# Patient Record
Sex: Male | Born: 1960
Health system: Southern US, Community
[De-identification: ages and names within clinical notes are randomized; demographics above are authoritative.]

## PROBLEM LIST (undated history)

## (undated) DIAGNOSIS — M109 Gout, unspecified: Secondary | ICD-10-CM

## (undated) DIAGNOSIS — F1410 Cocaine abuse, uncomplicated: Secondary | ICD-10-CM

## (undated) DIAGNOSIS — I639 Cerebral infarction, unspecified: Secondary | ICD-10-CM

## (undated) DIAGNOSIS — I2510 Atherosclerotic heart disease of native coronary artery without angina pectoris: Secondary | ICD-10-CM

## (undated) DIAGNOSIS — Z720 Tobacco use: Secondary | ICD-10-CM

## (undated) HISTORY — PX: TOE SURGERY: SHX1073

---

## 2014-11-26 ENCOUNTER — Emergency Department (HOSPITAL_COMMUNITY)
Admission: EM | Admit: 2014-11-26 | Discharge: 2014-11-26 | Disposition: A | Discharge status: Home / Self Care | Payer: Medicaid Other | Attending: Emergency Medicine | Admitting: Emergency Medicine

## 2014-11-26 ENCOUNTER — Encounter (HOSPITAL_COMMUNITY): Payer: Self-pay | Admitting: Emergency Medicine

## 2014-11-26 ENCOUNTER — Emergency Department (HOSPITAL_COMMUNITY): Payer: Medicaid Other

## 2014-11-26 DIAGNOSIS — Z720 Tobacco use: Secondary | ICD-10-CM | POA: Diagnosis not present

## 2014-11-26 DIAGNOSIS — Y998 Other external cause status: Secondary | ICD-10-CM | POA: Diagnosis not present

## 2014-11-26 DIAGNOSIS — M25561 Pain in right knee: Secondary | ICD-10-CM

## 2014-11-26 DIAGNOSIS — S8991XA Unspecified injury of right lower leg, initial encounter: Secondary | ICD-10-CM | POA: Diagnosis not present

## 2014-11-26 DIAGNOSIS — Y9389 Activity, other specified: Secondary | ICD-10-CM | POA: Diagnosis not present

## 2014-11-26 DIAGNOSIS — Y92410 Unspecified street and highway as the place of occurrence of the external cause: Secondary | ICD-10-CM | POA: Diagnosis not present

## 2014-11-26 DIAGNOSIS — M25512 Pain in left shoulder: Secondary | ICD-10-CM

## 2014-11-26 DIAGNOSIS — S4992XA Unspecified injury of left shoulder and upper arm, initial encounter: Secondary | ICD-10-CM | POA: Diagnosis present

## 2014-11-26 MED ORDER — KETOROLAC TROMETHAMINE 60 MG/2ML IM SOLN
60.0000 mg | Freq: Once | INTRAMUSCULAR | Status: AC
  Administered 2014-11-26: 60 mg via INTRAMUSCULAR
  Filled 2014-11-26: qty 2

## 2014-11-26 MED ORDER — DIAZEPAM 5 MG PO TABS
5.0000 mg | ORAL_TABLET | Freq: Once | ORAL | Status: AC
  Administered 2014-11-26: 5 mg via ORAL
  Filled 2014-11-26: qty 1

## 2014-11-26 MED ORDER — NAPROXEN 500 MG PO TABS: 500.0000 mg | ORAL_TABLET | Freq: Two times a day (BID) | ORAL | Status: DC

## 2014-11-26 MED ORDER — METHOCARBAMOL 500 MG PO TABS: 500.0000 mg | ORAL_TABLET | Freq: Two times a day (BID) | ORAL | Status: DC
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
## 2014-11-26 NOTE — ED Notes (Signed)
Pt in X ray
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
## 2015-06-24 ENCOUNTER — Emergency Department (EMERGENCY_DEPARTMENT_HOSPITAL)
Admit: 2015-06-24 | Discharge: 2015-06-24 | Disposition: A | Discharge status: Home / Self Care | Payer: Medicaid Other | Attending: Emergency Medicine | Admitting: Emergency Medicine

## 2015-06-24 ENCOUNTER — Encounter (HOSPITAL_COMMUNITY): Payer: Self-pay | Admitting: *Deleted

## 2015-06-24 ENCOUNTER — Emergency Department (HOSPITAL_COMMUNITY)
Admission: EM | Admit: 2015-06-24 | Discharge: 2015-06-24 | Disposition: A | Discharge status: Home / Self Care | Payer: Medicaid Other | Attending: Emergency Medicine | Admitting: Emergency Medicine

## 2015-06-24 ENCOUNTER — Emergency Department (HOSPITAL_COMMUNITY): Payer: Medicaid Other

## 2015-06-24 DIAGNOSIS — R2243 Localized swelling, mass and lump, lower limb, bilateral: Secondary | ICD-10-CM | POA: Diagnosis not present

## 2015-06-24 DIAGNOSIS — Z79899 Other long term (current) drug therapy: Secondary | ICD-10-CM | POA: Diagnosis not present

## 2015-06-24 DIAGNOSIS — F17210 Nicotine dependence, cigarettes, uncomplicated: Secondary | ICD-10-CM | POA: Diagnosis not present

## 2015-06-24 DIAGNOSIS — R002 Palpitations: Secondary | ICD-10-CM | POA: Diagnosis not present

## 2015-06-24 DIAGNOSIS — M79609 Pain in unspecified limb: Secondary | ICD-10-CM | POA: Diagnosis not present

## 2015-06-24 DIAGNOSIS — M7989 Other specified soft tissue disorders: Secondary | ICD-10-CM | POA: Diagnosis not present

## 2015-06-24 DIAGNOSIS — R079 Chest pain, unspecified: Secondary | ICD-10-CM | POA: Diagnosis not present

## 2015-06-24 DIAGNOSIS — R0602 Shortness of breath: Secondary | ICD-10-CM | POA: Insufficient documentation

## 2015-06-24 LAB — COMPREHENSIVE METABOLIC PANEL
ALBUMIN: 3 g/dL — AB (ref 3.5–5.0)
ALK PHOS: 62 U/L (ref 38–126)
ALT: 15 U/L — ABNORMAL LOW (ref 17–63)
ANION GAP: 8 (ref 5–15)
AST: 17 U/L (ref 15–41)
BILIRUBIN TOTAL: 0.4 mg/dL (ref 0.3–1.2)
BUN: 8 mg/dL (ref 6–20)
CALCIUM: 8.8 mg/dL — AB (ref 8.9–10.3)
CO2: 27 mmol/L (ref 22–32)
Chloride: 106 mmol/L (ref 101–111)
Creatinine, Ser: 1.12 mg/dL (ref 0.61–1.24)
GFR calc Af Amer: >60 mL/min (ref >60)
GLUCOSE: 94 mg/dL (ref 65–99)
POTASSIUM: 4.1 mmol/L (ref 3.5–5.1)
Sodium: 141 mmol/L (ref 135–145)
TOTAL PROTEIN: 6.1 g/dL — AB (ref 6.5–8.1)

## 2015-06-24 LAB — CBC WITH DIFFERENTIAL/PLATELET
BASOS ABS: 0 10*3/uL (ref 0.0–0.1)
Basophils Relative: 0 %
EOS PCT: 5 %
Eosinophils Absolute: 0.3 10*3/uL (ref 0.0–0.7)
HCT: 40.1 % (ref 39.0–52.0)
Hemoglobin: 12.9 g/dL — ABNORMAL LOW (ref 13.0–17.0)
LYMPHS PCT: 29 %
Lymphs Abs: 2 10*3/uL (ref 0.7–4.0)
MCH: 28.7 pg (ref 26.0–34.0)
MCHC: 32.2 g/dL (ref 30.0–36.0)
MCV: 89.3 fL (ref 78.0–100.0)
MONO ABS: 0.4 10*3/uL (ref 0.1–1.0)
MONOS PCT: 6 %
Neutro Abs: 4.1 10*3/uL (ref 1.7–7.7)
Neutrophils Relative %: 60 %
Platelets: 274 10*3/uL (ref 150–400)
RBC: 4.49 MIL/uL (ref 4.22–5.81)
RDW: 13.5 % (ref 11.5–15.5)
WBC: 6.9 10*3/uL (ref 4.0–10.5)

## 2015-06-24 LAB — BRAIN NATRIURETIC PEPTIDE: B Natriuretic Peptide: 44.1 pg/mL (ref 0.0–100.0)

## 2015-06-24 MED ORDER — HYDROCODONE-ACETAMINOPHEN 5-325 MG PO TABS: 1.0000 | ORAL_TABLET | Freq: Four times a day (QID) | ORAL | Status: DC | PRN

## 2015-06-24 NOTE — Discharge Instructions (Signed)
Edema °Edema is an abnormal buildup of fluids in your body tissues. Edema is somewhat dependent on gravity to pull the fluid to the lowest place in your body. That makes the condition more common in the legs and thighs (lower extremities). Painless swelling of the feet and ankles is common and becomes more likely as you get older. It is also common in looser tissues, like around your eyes.  °When the affected area is squeezed, the fluid may move out of that spot and leave a dent for a few moments. This dent is called pitting.  °CAUSES  °There are many possible causes of edema. Eating too much salt and being on your feet or sitting for a long time can cause edema in your legs and ankles. Hot weather may make edema worse. Common medical causes of edema include: °· Heart failure. °· Liver disease. °· Kidney disease. °· Weak blood vessels in your legs. °· Cancer. °· An injury. °· Pregnancy. °· Some medications. °· Obesity.  °SYMPTOMS  °Edema is usually painless. Your skin may look swollen or shiny.  °DIAGNOSIS  °Your health care provider may be able to diagnose edema by asking about your medical history and doing a physical exam. You may need to have tests such as X-rays, an electrocardiogram, or blood tests to check for medical conditions that may cause edema.  °TREATMENT  °Edema treatment depends on the cause. If you have heart, liver, or kidney disease, you need the treatment appropriate for these conditions. General treatment may include: °· Elevation of the affected body part above the level of your heart. °· Compression of the affected body part. Pressure from elastic bandages or support stockings squeezes the tissues and forces fluid back into the blood vessels. This keeps fluid from entering the tissues. °· Restriction of fluid and salt intake. °· Use of a water pill (diuretic). These medications are appropriate only for some types of edema. They pull fluid out of your body and make you urinate more often. This  gets rid of fluid and reduces swelling, but diuretics can have side effects. Only use diuretics as directed by your health care provider. °HOME CARE INSTRUCTIONS  °· Keep the affected body part above the level of your heart when you are lying down.   °· Do not sit still or stand for prolonged periods.   °· Do not put anything directly under your knees when lying down. °· Do not wear constricting clothing or garters on your upper legs.   °· Exercise your legs to work the fluid back into your blood vessels. This may help the swelling go down.   °· Wear elastic bandages or support stockings to reduce ankle swelling as directed by your health care provider.   °· Eat a low-salt diet to reduce fluid if your health care provider recommends it.   °· Only take medicines as directed by your health care provider.  °SEEK MEDICAL CARE IF:  °· Your edema is not responding to treatment. °· You have heart, liver, or kidney disease and notice symptoms of edema. °· You have edema in your legs that does not improve after elevating them.   °· You have sudden and unexplained weight gain. °SEEK IMMEDIATE MEDICAL CARE IF:  °· You develop shortness of breath or chest pain.   °· You cannot breathe when you lie down. °· You develop pain, redness, or warmth in the swollen areas.   °· You have heart, liver, or kidney disease and suddenly get edema. °· You have a fever and your symptoms suddenly get worse. °MAKE SURE YOU:  °·   Understand these instructions. °· Will watch your condition. °· Will get help right away if you are not doing well or get worse. °  °This information is not intended to replace advice given to you by your health care provider. Make sure you discuss any questions you have with your health care provider. °  °Document Released: 05/20/2005 Document Revised: 06/10/2014 Document Reviewed: 03/12/2013 °Elsevier Interactive Patient Education ©2016 [employer] ° °

## 2015-06-24 NOTE — ED Notes (Signed)
Pt sts bilateral leg pain and swelling x several months

## 2015-06-24 NOTE — ED Notes (Signed)
Patient transported to Vascular 

## 2015-06-24 NOTE — ED Provider Notes (Signed)
CSN: [id number]     Arrival date & time 06/24/15  1325 History   First MD Initiated Contact with Patient 06/24/15 1607     Chief Complaint  Patient presents with  . Leg Swelling     (Consider location/radiation/quality/duration/timing/severity/associated sxs/prior Treatment) HPI Comments: Patient presents to the emergency department with chief complaints of bilateral lower leg swelling. He states that the symptoms are intermittent. He denies any recent long travel or recent surgeries. He states that he has had some associated heart palpitations, and twinges of shortness of breath/chest pain. He is not having these symptoms now. He denies any fevers, chills, cough, nausea, vomiting, or diarrhea. He has tried taking hydrocodone with some relief. He denies any recent injuries. He is an everyday smoker. Denies any other medical problems.  The history is provided by the patient. No language interpreter was used.    History reviewed. No pertinent past medical history. History reviewed. No pertinent past surgical history. History reviewed. No pertinent family history. Social History  Substance Use Topics  . Smoking status: Current Every Day Smoker -- 1.00 packs/day    Types: Cigarettes  . Smokeless tobacco: None  . Alcohol Use: Yes     Comment: occ    Review of Systems  Constitutional: Negative for fever and chills.  Respiratory: Positive for shortness of breath.   Cardiovascular: Positive for chest pain, palpitations and leg swelling.  Gastrointestinal: Negative for nausea, vomiting, diarrhea and constipation.  Genitourinary: Negative for dysuria.  All other systems reviewed and are negative.     Allergies  Review of patient's allergies indicates no known allergies.  Home Medications   Prior to Admission medications   Medication Sig Start Date End Date Taking? Authorizing Provider  methocarbamol (ROBAXIN) 500 MG tablet Take 1 tablet (500 mg total) by mouth 2 (two) times daily.  11/26/14   Joycie Peek, PA-C  naproxen (NAPROSYN) 500 MG tablet Take 1 tablet (500 mg total) by mouth 2 (two) times daily. 11/26/14   Joycie Peek, PA-C   BP 133/97 mmHg  Pulse 63  Temp(Src) 99.2 F (37.3 C) (Oral)  Resp 18  SpO2 100% Physical Exam  Constitutional: He is oriented to person, place, and time. He appears well-developed and well-nourished.  HENT:  Head: Normocephalic and atraumatic.  Eyes: Conjunctivae and EOM are normal. Pupils are equal, round, and reactive to light. Right eye exhibits no discharge. Left eye exhibits no discharge. No scleral icterus.  Neck: Normal range of motion. Neck supple. No JVD present.  Cardiovascular: Normal rate, regular rhythm and normal heart sounds.  Exam reveals no gallop and no friction rub.   No murmur heard. Pulmonary/Chest: Effort normal and breath sounds normal. No respiratory distress. He has no wheezes. He has no rales. He exhibits no tenderness.  Abdominal: Soft. He exhibits no distension and no mass. There is no tenderness. There is no rebound and no guarding.  Musculoskeletal: Normal range of motion. He exhibits edema and tenderness.  Bilateral lower extremity swelling below the knees, no pitting edema, moderately tender to palpation about bilateral ankles and calves  Neurological: He is alert and oriented to person, place, and time.  Skin: Skin is warm and dry. No rash noted. No erythema.  No erythema, rash, or abscess, no evidence of infection  Psychiatric: He has a normal mood and affect. His behavior is normal. Judgment and thought content normal.  Nursing note and vitals reviewed.   ED Course  Procedures (including critical care time)  Results for orders placed  or performed during the hospital encounter of 06/24/15  CBC with Differential/Platelet  Result Value Ref Range   WBC 6.9 4.0 - 10.5 K/uL   RBC 4.49 4.22 - 5.81 MIL/uL   Hemoglobin 12.9 (L) 13.0 - 17.0 g/dL   HCT 16.1 09.6 - 04.5 %   MCV 89.3 78.0 - 100.0  fL   MCH 28.7 26.0 - 34.0 pg   MCHC 32.2 30.0 - 36.0 g/dL   RDW 40.9 81.1 - 91.4 %   Platelets 274 150 - 400 K/uL   Neutrophils Relative % 60 %   Neutro Abs 4.1 1.7 - 7.7 K/uL   Lymphocytes Relative 29 %   Lymphs Abs 2.0 0.7 - 4.0 K/uL   Monocytes Relative 6 %   Monocytes Absolute 0.4 0.1 - 1.0 K/uL   Eosinophils Relative 5 %   Eosinophils Absolute 0.3 0.0 - 0.7 K/uL   Basophils Relative 0 %   Basophils Absolute 0.0 0.0 - 0.1 K/uL  Brain natriuretic peptide  Result Value Ref Range   B Natriuretic Peptide 44.1 0.0 - 100.0 pg/mL  Comprehensive metabolic panel  Result Value Ref Range   Sodium 141 135 - 145 mmol/L   Potassium 4.1 3.5 - 5.1 mmol/L   Chloride 106 101 - 111 mmol/L   CO2 27 22 - 32 mmol/L   Glucose, Bld 94 65 - 99 mg/dL   BUN 8 6 - 20 mg/dL   Creatinine, Ser 7.82 0.61 - 1.24 mg/dL   Calcium 8.8 (L) 8.9 - 10.3 mg/dL   Total Protein 6.1 (L) 6.5 - 8.1 g/dL   Albumin 3.0 (L) 3.5 - 5.0 g/dL   AST 17 15 - 41 U/L   ALT 15 (L) 17 - 63 U/L   Alkaline Phosphatase 62 38 - 126 U/L   Total Bilirubin 0.4 0.3 - 1.2 mg/dL   GFR calc non Af Amer >60 >60 mL/min   GFR calc Af Amer >60 >60 mL/min   Anion gap 8 5 - 15   Dg Chest 2 View  06/24/2015  CLINICAL DATA:  Chronic bilateral foot and leg pain and swelling. Palpitations. Initial encounter. EXAM: CHEST  2 VIEW COMPARISON:  None. FINDINGS: The lungs are well-aerated. Mild peribronchial thickening is noted. There is no evidence of focal opacification, pleural effusion or pneumothorax. The heart is normal in size; the mediastinal contour is within normal limits. No acute osseous abnormalities are seen. IMPRESSION: Mild peribronchial thickening noted.  Lungs otherwise clear. Electronically Signed   By: Roanna Raider M.D.   On: 06/24/2015 18:28     I have personally reviewed and evaluated these images and lab results as part of my medical decision-making.    MDM   Final diagnoses:  Leg swelling    Patient with bilateral leg  swelling.  Reports occasional "twinges" of CP and SOB and palpitations.  No history of ACS/PE/DVT.  No recent travel.  No abdominal pain.  Symptoms are intermittent.  Will check bilateral LE dopplers, though DVT felt to be less likely, but he does have significant calf tenderness.  Given occasional SOB and palpitations will check BNP.   CBC is normal with the exception of slightly low HGB of 12.9, CMP remarkable for albumin of 3.0, will recommend primary care follow-up for this. BNP is normal. Doppler DVT studies were negative. Chest x-ray is remarkable only for mild peribronchial thickening.  Plan for primary care follow-up. Advised patient to consider wearing TED hose for compression and support. Patient understands and agrees the  plan. He is stable and ready for discharge.  Patient discussed with Dr. Denton Lank, who agrees with the plan.   Roxy Horseman, PA-C 06/24/15 1833  Cathren Laine, MD 06/24/15 [phone]

## 2015-06-24 NOTE — Progress Notes (Signed)
VASCULAR LAB PRELIMINARY  PRELIMINARY  PRELIMINARY  PRELIMINARY  Bilateral lower extremity venous duplex completed.    Preliminary report:  There is no DVT or SVT noted in the bilateral lower extremities.  There are enlarged lymph nodes noted in the bilateral groins.  Annai Heick, RVT 06/24/2015, 5:57 PM

## 2016-10-08 ENCOUNTER — Emergency Department (HOSPITAL_COMMUNITY): Payer: Medicaid Other

## 2016-10-08 ENCOUNTER — Encounter (HOSPITAL_COMMUNITY)
Admission: EM | Disposition: A | Discharge status: Home / Self Care | Payer: Self-pay | Source: Home / Self Care | Attending: Cardiovascular Disease

## 2016-10-08 ENCOUNTER — Encounter (HOSPITAL_COMMUNITY): Payer: Self-pay | Admitting: Emergency Medicine

## 2016-10-08 ENCOUNTER — Inpatient Hospital Stay (HOSPITAL_COMMUNITY)
Admission: EM | Admit: 2016-10-08 | Discharge: 2016-10-11 | DRG: 246 | Disposition: A | Discharge status: Home / Self Care | Payer: Medicaid Other | Attending: Cardiovascular Disease | Admitting: Cardiovascular Disease

## 2016-10-08 DIAGNOSIS — Z720 Tobacco use: Secondary | ICD-10-CM

## 2016-10-08 DIAGNOSIS — Z79899 Other long term (current) drug therapy: Secondary | ICD-10-CM

## 2016-10-08 DIAGNOSIS — E785 Hyperlipidemia, unspecified: Secondary | ICD-10-CM

## 2016-10-08 DIAGNOSIS — F17210 Nicotine dependence, cigarettes, uncomplicated: Secondary | ICD-10-CM | POA: Diagnosis present

## 2016-10-08 DIAGNOSIS — I2109 ST elevation (STEMI) myocardial infarction involving other coronary artery of anterior wall: Secondary | ICD-10-CM

## 2016-10-08 DIAGNOSIS — Z8249 Family history of ischemic heart disease and other diseases of the circulatory system: Secondary | ICD-10-CM

## 2016-10-08 DIAGNOSIS — I2102 ST elevation (STEMI) myocardial infarction involving left anterior descending coronary artery: Principal | ICD-10-CM

## 2016-10-08 DIAGNOSIS — Z955 Presence of coronary angioplasty implant and graft: Secondary | ICD-10-CM

## 2016-10-08 DIAGNOSIS — I259 Chronic ischemic heart disease, unspecified: Secondary | ICD-10-CM

## 2016-10-08 DIAGNOSIS — Z791 Long term (current) use of non-steroidal anti-inflammatories (NSAID): Secondary | ICD-10-CM

## 2016-10-08 DIAGNOSIS — M109 Gout, unspecified: Secondary | ICD-10-CM | POA: Diagnosis present

## 2016-10-08 DIAGNOSIS — I5021 Acute systolic (congestive) heart failure: Secondary | ICD-10-CM | POA: Diagnosis present

## 2016-10-08 DIAGNOSIS — Z79891 Long term (current) use of opiate analgesic: Secondary | ICD-10-CM

## 2016-10-08 DIAGNOSIS — Z716 Tobacco abuse counseling: Secondary | ICD-10-CM

## 2016-10-08 DIAGNOSIS — I2584 Coronary atherosclerosis due to calcified coronary lesion: Secondary | ICD-10-CM | POA: Diagnosis present

## 2016-10-08 DIAGNOSIS — I213 ST elevation (STEMI) myocardial infarction of unspecified site: Secondary | ICD-10-CM | POA: Diagnosis present

## 2016-10-08 HISTORY — PX: LEFT HEART CATH AND CORONARY ANGIOGRAPHY: CATH118249

## 2016-10-08 HISTORY — DX: Gout, unspecified: M10.9

## 2016-10-08 HISTORY — PX: CORONARY STENT INTERVENTION: CATH118234

## 2016-10-08 HISTORY — DX: Tobacco use: Z72.0

## 2016-10-08 HISTORY — DX: Atherosclerotic heart disease of native coronary artery without angina pectoris: I25.10

## 2016-10-08 LAB — PROTIME-INR
INR: 0.98
PROTHROMBIN TIME: 13 s (ref 11.4–15.2)

## 2016-10-08 LAB — APTT: aPTT: 26 seconds (ref 24–36)

## 2016-10-08 LAB — COMPREHENSIVE METABOLIC PANEL
ALBUMIN: 3.2 g/dL — AB (ref 3.5–5.0)
ALK PHOS: 70 U/L (ref 38–126)
ALT: 21 U/L (ref 17–63)
ANION GAP: 9 (ref 5–15)
AST: 30 U/L (ref 15–41)
BILIRUBIN TOTAL: 0.2 mg/dL — AB (ref 0.3–1.2)
BUN: 15 mg/dL (ref 6–20)
CALCIUM: 8.7 mg/dL — AB (ref 8.9–10.3)
CO2: 22 mmol/L (ref 22–32)
Chloride: 106 mmol/L (ref 101–111)
Creatinine, Ser: 1.16 mg/dL (ref 0.61–1.24)
GLUCOSE: 120 mg/dL — AB (ref 65–99)
POTASSIUM: 3.5 mmol/L (ref 3.5–5.1)
Sodium: 137 mmol/L (ref 135–145)
TOTAL PROTEIN: 5.8 g/dL — AB (ref 6.5–8.1)

## 2016-10-08 LAB — CBC WITH DIFFERENTIAL/PLATELET
BASOS PCT: 0 %
Basophils Absolute: 0 10*3/uL (ref 0.0–0.1)
EOS ABS: 0.2 10*3/uL (ref 0.0–0.7)
EOS PCT: 2 %
HCT: 42.6 % (ref 39.0–52.0)
HEMOGLOBIN: 13.9 g/dL (ref 13.0–17.0)
Lymphocytes Relative: 29 %
Lymphs Abs: 2.4 10*3/uL (ref 0.7–4.0)
MCH: 28.4 pg (ref 26.0–34.0)
MCHC: 32.6 g/dL (ref 30.0–36.0)
MCV: 87.1 fL (ref 78.0–100.0)
MONOS PCT: 6 %
Monocytes Absolute: 0.5 10*3/uL (ref 0.1–1.0)
NEUTROS PCT: 63 %
Neutro Abs: 5.2 10*3/uL (ref 1.7–7.7)
PLATELETS: 254 10*3/uL (ref 150–400)
RBC: 4.89 MIL/uL (ref 4.22–5.81)
RDW: 13.5 % (ref 11.5–15.5)
WBC: 8.2 10*3/uL (ref 4.0–10.5)

## 2016-10-08 LAB — TROPONIN I: TROPONIN I: 0.04 ng/mL — AB (ref <0.03)

## 2016-10-08 LAB — LIPID PANEL
CHOLESTEROL: 155 mg/dL (ref 0–200)
HDL: 47 mg/dL (ref >40)
LDL Cholesterol: 90 mg/dL (ref 0–99)
TRIGLYCERIDES: 91 mg/dL (ref <150)
Total CHOL/HDL Ratio: 3.3 RATIO
VLDL: 18 mg/dL (ref 0–40)

## 2016-10-08 SURGERY — LEFT HEART CATH AND CORONARY ANGIOGRAPHY
Anesthesia: LOCAL

## 2016-10-08 MED ORDER — TICAGRELOR 90 MG PO TABS
ORAL_TABLET | ORAL | Status: AC
  Filled 2016-10-08: qty 2

## 2016-10-08 MED ORDER — LIDOCAINE HCL (PF) 1 % IJ SOLN
INTRAMUSCULAR | Status: DC | PRN
  Administered 2016-10-08: 2 mL

## 2016-10-08 MED ORDER — MIDAZOLAM HCL 2 MG/2ML IJ SOLN
INTRAMUSCULAR | Status: AC
  Filled 2016-10-08: qty 2

## 2016-10-08 MED ORDER — FENTANYL CITRATE (PF) 100 MCG/2ML IJ SOLN
INTRAMUSCULAR | Status: AC
  Filled 2016-10-08: qty 2

## 2016-10-08 MED ORDER — VERAPAMIL HCL 2.5 MG/ML IV SOLN
INTRAVENOUS | Status: AC
Start: 2016-10-08 — End: 2016-10-08
  Filled 2016-10-08: qty 2

## 2016-10-08 MED ORDER — BIVALIRUDIN BOLUS VIA INFUSION - CUPID
INTRAVENOUS | Status: DC | PRN
  Administered 2016-10-08: 54.75 mg via INTRAVENOUS

## 2016-10-08 MED ORDER — IOPAMIDOL (ISOVUE-370) INJECTION 76%
INTRAVENOUS | Status: AC
  Filled 2016-10-08: qty 50

## 2016-10-08 MED ORDER — CANGRELOR BOLUS VIA INFUSION
INTRAVENOUS | Status: DC | PRN
  Administered 2016-10-08: 2190 ug via INTRAVENOUS

## 2016-10-08 MED ORDER — BIVALIRUDIN TRIFLUOROACETATE 250 MG IV SOLR
INTRAVENOUS | Status: AC
  Filled 2016-10-08: qty 250

## 2016-10-08 MED ORDER — ASPIRIN 81 MG PO CHEW: 324.0000 mg | CHEWABLE_TABLET | Freq: Once | ORAL | Status: DC

## 2016-10-08 MED ORDER — HEPARIN SODIUM (PORCINE) 5000 UNIT/ML IJ SOLN: 4000.0000 [IU] | Freq: Once | INTRAMUSCULAR | Status: DC

## 2016-10-08 MED ORDER — CANGRELOR TETRASODIUM 50 MG IV SOLR
INTRAVENOUS | Status: AC
  Filled 2016-10-08: qty 50

## 2016-10-08 MED ORDER — IOPAMIDOL (ISOVUE-370) INJECTION 76%
INTRAVENOUS | Status: AC
  Filled 2016-10-08: qty 125

## 2016-10-08 MED ORDER — SODIUM CHLORIDE 0.9 % IV SOLN: INTRAVENOUS | Status: DC

## 2016-10-08 MED ORDER — VERAPAMIL HCL 2.5 MG/ML IV SOLN
INTRAVENOUS | Status: DC | PRN
  Administered 2016-10-08: via INTRA_ARTERIAL

## 2016-10-08 MED ORDER — HEPARIN (PORCINE) IN NACL 2-0.9 UNIT/ML-% IJ SOLN
INTRAMUSCULAR | Status: AC
  Filled 2016-10-08: qty 1000

## 2016-10-08 MED ORDER — FENTANYL CITRATE (PF) 100 MCG/2ML IJ SOLN
INTRAMUSCULAR | Status: DC | PRN
  Administered 2016-10-08: 25 ug via INTRAVENOUS

## 2016-10-08 MED ORDER — MIDAZOLAM HCL 2 MG/2ML IJ SOLN
INTRAMUSCULAR | Status: DC | PRN
  Administered 2016-10-08: 1 mg via INTRAVENOUS

## 2016-10-08 MED ORDER — SODIUM CHLORIDE 0.9 % IV SOLN
INTRAVENOUS | Status: DC | PRN
  Administered 2016-10-08: 1.75 mg/kg/h via INTRAVENOUS

## 2016-10-08 MED ORDER — FENTANYL CITRATE (PF) 100 MCG/2ML IJ SOLN
50.0000 ug | Freq: Once | INTRAMUSCULAR | Status: AC
Start: 2016-10-08 — End: 2016-10-08
  Administered 2016-10-08: 50 ug via INTRAVENOUS

## 2016-10-08 MED ORDER — LIDOCAINE HCL (PF) 1 % IJ SOLN
INTRAMUSCULAR | Status: AC
  Filled 2016-10-08: qty 30

## 2016-10-08 MED ORDER — HEPARIN (PORCINE) IN NACL 2-0.9 UNIT/ML-% IJ SOLN
INTRAMUSCULAR | Status: DC | PRN
  Administered 2016-10-08: 1000 mL

## 2016-10-08 MED ORDER — HEPARIN SODIUM (PORCINE) 5000 UNIT/ML IJ SOLN
INTRAMUSCULAR | Status: AC
  Administered 2016-10-08: 4000 [IU]
  Filled 2016-10-08: qty 1

## 2016-10-08 SURGICAL SUPPLY — 18 items
BALLN MOZEC 2.0X12 (BALLOONS) ×2
BALLN ~~LOC~~ EUPHORA RX 3.75X12 (BALLOONS) ×2
BALLOON MOZEC 2.0X12 (BALLOONS) IMPLANT
BALLOON ~~LOC~~ EUPHORA RX 3.75X12 (BALLOONS) IMPLANT
CATH INFINITI 5FR ANG PIGTAIL (CATHETERS) ×1 IMPLANT
CATH INFINITI JR4 5F (CATHETERS) ×1 IMPLANT
CATH VISTA GUIDE 6FR XBLAD3.5 (CATHETERS) ×1 IMPLANT
DEVICE RAD COMP TR BAND LRG (VASCULAR PRODUCTS) ×1 IMPLANT
GLIDESHEATH SLEND SS 6F .021 (SHEATH) ×1 IMPLANT
GUIDEWIRE INQWIRE 1.5J.035X260 (WIRE) IMPLANT
INQWIRE 1.5J .035X260CM (WIRE) ×2
KIT ENCORE 26 ADVANTAGE (KITS) ×1 IMPLANT
KIT HEART LEFT (KITS) ×2 IMPLANT
PACK CARDIAC CATHETERIZATION (CUSTOM PROCEDURE TRAY) ×2 IMPLANT
STENT SYNERGY DES 3.5X16 (Permanent Stent) ×1 IMPLANT
SYR MEDRAD MARK V 150ML (SYRINGE) ×2 IMPLANT
TRANSDUCER W/STOPCOCK (MISCELLANEOUS) ×2 IMPLANT
WIRE COUGAR XT STRL 190CM (WIRE) ×1 IMPLANT

## 2016-10-08 NOTE — ED Notes (Signed)
4,000 units heparin given IV.

## 2016-10-08 NOTE — ED Triage Notes (Signed)
Per EMS, pt began having chest pain 45 minutes PTA, radiating to the right arm. Denies n/v, endorses shortness of breath. No known cardiac issues. 324 aspirin, 1 NTG given PTA. BP-140/90, SpO2-100% room air.

## 2016-10-08 NOTE — ED Provider Notes (Signed)
[facility]-EMERGENCY DEPT Provider Note   CSN: [id number] Arrival date & time: 10/08/16  2254     History   Chief Complaint Chief Complaint  Patient presents with  . Code STEMI    HPI Mark Herrera is a 56 y.o. male.  The history is provided by the patient.  Chest Pain   This is a new problem. The current episode started less than 1 hour ago. The problem occurs constantly. The problem has been gradually improving (after nitro). 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 The pain is present in the substernal region. The pain is severe. The quality of the pain is described as pressure-like. Radiates to: radiates to right shoulder. Duration of episode(s) is 45 minutes. Associated symptoms include diaphoresis, nausea and shortness of breath. Pertinent negatives include no abdominal pain and no cough. He has tried nitroglycerin (asa) for the symptoms. The treatment provided mild relief. Risk factors include male gender and smoking/tobacco exposure.    Past Medical History:  Diagnosis Date  . Gout   . Tobacco abuse     There are no active problems to display for this patient.   Past Surgical History:  Procedure Laterality Date  . TOE SURGERY         Home Medications    Prior to Admission medications   Medication Sig Start Date End Date Taking? Authorizing Provider  HYDROcodone-acetaminophen (NORCO/VICODIN) 5-325 MG tablet Take 1-2 tablets by mouth every 6 (six) hours as needed. 06/24/15   Roxy Horseman, PA-C  methocarbamol (ROBAXIN) 500 MG tablet Take 1 tablet (500 mg total) by mouth 2 (two) times daily. 11/26/14   Cartner, Sharlet Salina, PA-C  naproxen (NAPROSYN) 500 MG tablet Take 1 tablet (500 mg total) by mouth 2 (two) times daily. 11/26/14   Joycie Peek, PA-C    Family History Family History  Problem Relation Age of Onset  . Hypertension Father     Social History Social History  Substance Use Topics  . Smoking status: Current Every Day Smoker   Packs/day: 1.00    Types: Cigarettes  . Smokeless tobacco: Never Used  . Alcohol use Yes     Comment: occ     Allergies   Patient has no known allergies.   Review of Systems Review of Systems  Unable to perform ROS: Acuity of condition  Constitutional: Positive for diaphoresis.  Respiratory: Positive for shortness of breath. Negative for cough.   Cardiovascular: Positive for chest pain.  Gastrointestinal: Positive for nausea. Negative for abdominal pain.     Physical Exam Updated Vital Signs BP (!) 141/96 (BP Location: Left Arm)   Pulse 80   Temp 98.1 F (36.7 C) (Oral)   Resp (!) 24   SpO2 100%   Physical Exam  Constitutional: He is oriented to person, place, and time. He appears well-developed and well-nourished. He appears distressed.  HENT:  Head: Normocephalic and atraumatic.  Mouth/Throat: Oropharynx is clear and moist.  Eyes: Conjunctivae and EOM are normal.  Neck: Neck supple.  Cardiovascular: Normal rate, regular rhythm and intact distal pulses.   No murmur heard. Pulmonary/Chest: Effort normal and breath sounds normal. No respiratory distress.  Abdominal: Soft. There is no tenderness.  Musculoskeletal: He exhibits no edema or tenderness.  Neurological: He is alert and oriented to person, place, and time. He exhibits normal muscle tone. Coordination normal.  Skin: Skin is warm. He is diaphoretic.  Psychiatric: He has a normal mood and affect.  Nursing note and vitals reviewed.    ED Treatments /  Results  Labs (all labs ordered are listed, but only abnormal results are displayed) Labs Reviewed  CBC WITH DIFFERENTIAL/PLATELET  PROTIME-INR  APTT  COMPREHENSIVE METABOLIC PANEL  TROPONIN I  LIPID PANEL    EKG  EKG Interpretation None       Radiology No results found.  Procedures Procedures (including critical care time)  Medications Ordered in ED Medications  0.9 %  sodium chloride infusion (not administered)  heparin injection 4,000  Units ( Intravenous MAR Hold 10/08/16 2318)  fentaNYL (SUBLIMAZE) 100 MCG/2ML injection (not administered)  lidocaine (PF) (XYLOCAINE) 1 % injection (2 mLs  Given 10/08/16 2329)  Radial Cocktail/Verapamil only ( Intra-arterial Given 10/08/16 2330)  heparin infusion 2 units/mL in 0.9 % sodium chloride (1,000 mLs Other New Bag/Given 10/08/16 2330)  heparin 5000 UNIT/ML injection (4,000 Units  Given 10/08/16 2309)  fentaNYL (SUBLIMAZE) injection 50 mcg (50 mcg Intravenous Given 10/08/16 2308)     Initial Impression / Assessment and Plan / ED Course  I have reviewed the triage vital signs and the nursing notes.  Pertinent labs & imaging results that were available during my care of the patient were reviewed by me and considered in my medical decision making (see chart for details).     The patient is a 56 year old male with no known past medical history presents with EMS as a code STEMI. Chest pain started about 45 minutes prior to arrival but associated with right arm radiation, shortness of breath, diaphoresis. Upon arrival STEMI confirmed on EKG. He did receive nitroglycerin and aspirin prehospital with some improvement. Vital signs unremarkable. Cardiology at bedside and complained that immediately take the Cath Lab. Heparin will be given and lab were drawn  Final Clinical Impressions(s) / ED Diagnoses   Final diagnoses:  Acute ST elevation myocardial infarction (STEMI), unspecified artery (HCC)  Chest pain due to myocardial ischemia, unspecified ischemic chest pain type    New Prescriptions Current Discharge Medication List       Marijean NiemannWoodrum, Bettyjean Stefanski, MD 10/09/16 [id number]    Linwood DibblesKnapp, Jon, MD 10/09/16 [phone]

## 2016-10-08 NOTE — H&P (Signed)
     Patient ID: Mark Herrera MRN: [id number] DOB/AGE: 54/06/1960 54 y.o. Admit date: 10/08/2016  Primary Care Physician: No primary care provider on file. Primary Cardiologist: New  HPI: 56 yo male with history of tobacco abuse who had the onset of severe substernal chest pain 45 minutes ago. He called EMS. EKG with anterior ST elevation. Code STEMI activated by EMS. Pt with ongoing chest pain upon arrival to ED. He has no chronic medical problems but has smoked for 40 years. He does not see a physician regularly. He also notes dyspnea. No other complaints.   Review of systems complete and found to be negative unless listed above   Past Medical History:  Diagnosis Date  . Gout   . Tobacco abuse     Family History  Problem Relation Age of Onset  . Hypertension Father     Social History   Social History  . Marital status: Single    Spouse name: N/A  . Number of children: N/A  . Years of education: N/A   Occupational History  . Not on file.   Social History Main Topics  . Smoking status: Current Every Day Smoker    Packs/day: 1.00    Types: Cigarettes  . Smokeless tobacco: Never Used  . Alcohol use Yes     Comment: occ  . Drug use: Yes    Types: Marijuana  . Sexual activity: Not on file   Other Topics Concern  . Not on file   Social History Narrative  . No narrative on file    Past Surgical History:  Procedure Laterality Date  . TOE SURGERY      No Known Allergies  Prior to Admission Meds:  Prior to Admission medications   Medication Sig Start Date End Date Taking? Authorizing Provider  HYDROcodone-acetaminophen (NORCO/VICODIN) 5-325 MG tablet Take 1-2 tablets by mouth every 6 (six) hours as needed. 06/24/15   Roxy HorsemanBrowning, Robert, PA-C  methocarbamol (ROBAXIN) 500 MG tablet Take 1 tablet (500 mg total) by mouth 2 (two) times daily. 11/26/14   Cartner, Sharlet SalinaBenjamin, PA-C  naproxen (NAPROSYN) 500 MG tablet Take 1 tablet (500 mg total) by mouth 2 (two) times daily.  11/26/14   Joycie Peekartner, Benjamin, PA-C    Physical Exam: Blood pressure (!) 141/96, pulse 80, temperature 98.1 F (36.7 C), temperature source Oral, resp. rate (!) 24, SpO2 100 %.    General: Well developed, well nourished. He appears to be uncomfortable.  HEENT: OP clear, mucus membranes moist  SKIN: warm, dry. No rashes.  Neuro: No focal deficits  Musculoskeletal: Muscle strength 5/5 all ext  Psychiatric: Mood and affect normal  Neck: No JVD, no carotid bruits, no thyromegaly, no lymphadenopathy.  Lungs:Clear bilaterally, no wheezes, rhonci, crackles  Cardiovascular: Regular rate and rhythm. No murmurs, gallops or rubs.  Abdomen:Soft. Bowel sounds present. Non-tender.  Extremities: No lower extremity edema. Pulses are 2 + in the bilateral DP/PT.   Labs: pending  EKG: sinus, inferior ST depression. 1 mm ST elevation leads V2-V4.   ASSESSMENT AND PLAN:   1. Acute anterior STEMI: He has received 4000 units IV heparin in the ED. Plan for emergent cardiac cath with PCI. Further plans to follow after cath.   Earney Hamburghris Brayten Komar, MD 10/08/2016, 11:11 PM

## 2016-10-09 ENCOUNTER — Encounter (HOSPITAL_COMMUNITY): Payer: Self-pay | Admitting: Cardiovascular Disease

## 2016-10-09 ENCOUNTER — Other Ambulatory Visit (HOSPITAL_COMMUNITY): Payer: Medicaid Other

## 2016-10-09 DIAGNOSIS — Z8249 Family history of ischemic heart disease and other diseases of the circulatory system: Secondary | ICD-10-CM | POA: Diagnosis not present

## 2016-10-09 DIAGNOSIS — M109 Gout, unspecified: Secondary | ICD-10-CM | POA: Diagnosis present

## 2016-10-09 DIAGNOSIS — I213 ST elevation (STEMI) myocardial infarction of unspecified site: Secondary | ICD-10-CM | POA: Diagnosis present

## 2016-10-09 DIAGNOSIS — Z79899 Other long term (current) drug therapy: Secondary | ICD-10-CM | POA: Diagnosis not present

## 2016-10-09 DIAGNOSIS — I2510 Atherosclerotic heart disease of native coronary artery without angina pectoris: Secondary | ICD-10-CM

## 2016-10-09 DIAGNOSIS — Z716 Tobacco abuse counseling: Secondary | ICD-10-CM | POA: Diagnosis not present

## 2016-10-09 DIAGNOSIS — Z791 Long term (current) use of non-steroidal anti-inflammatories (NSAID): Secondary | ICD-10-CM | POA: Diagnosis not present

## 2016-10-09 DIAGNOSIS — I5021 Acute systolic (congestive) heart failure: Secondary | ICD-10-CM | POA: Diagnosis present

## 2016-10-09 DIAGNOSIS — R0789 Other chest pain: Secondary | ICD-10-CM | POA: Diagnosis not present

## 2016-10-09 DIAGNOSIS — F17210 Nicotine dependence, cigarettes, uncomplicated: Secondary | ICD-10-CM | POA: Diagnosis present

## 2016-10-09 DIAGNOSIS — Z79891 Long term (current) use of opiate analgesic: Secondary | ICD-10-CM | POA: Diagnosis not present

## 2016-10-09 DIAGNOSIS — I2584 Coronary atherosclerosis due to calcified coronary lesion: Secondary | ICD-10-CM | POA: Diagnosis present

## 2016-10-09 DIAGNOSIS — I2102 ST elevation (STEMI) myocardial infarction involving left anterior descending coronary artery: Secondary | ICD-10-CM | POA: Diagnosis present

## 2016-10-09 DIAGNOSIS — E785 Hyperlipidemia, unspecified: Secondary | ICD-10-CM | POA: Diagnosis present

## 2016-10-09 LAB — CBC
HCT: 39.6 % (ref 39.0–52.0)
Hemoglobin: 12.8 g/dL — ABNORMAL LOW (ref 13.0–17.0)
MCH: 28.3 pg (ref 26.0–34.0)
MCHC: 32.3 g/dL (ref 30.0–36.0)
MCV: 87.6 fL (ref 78.0–100.0)
Platelets: 251 10*3/uL (ref 150–400)
RBC: 4.52 MIL/uL (ref 4.22–5.81)
RDW: 13.4 % (ref 11.5–15.5)
WBC: 7.5 10*3/uL (ref 4.0–10.5)

## 2016-10-09 LAB — POCT I-STAT, CHEM 8
BUN: 16 mg/dL (ref 6–20)
CHLORIDE: 79 mmol/L — AB (ref 101–111)
CREATININE: 0.5 mg/dL — AB (ref 0.61–1.24)
Calcium, Ion: 1.12 mmol/L — ABNORMAL LOW (ref 1.15–1.40)
GLUCOSE: 85 mg/dL (ref 65–99)
HCT: 39 % (ref 39.0–52.0)
Hemoglobin: 13.3 g/dL (ref 13.0–17.0)
POTASSIUM: 3 mmol/L — AB (ref 3.5–5.1)
Sodium: 117 mmol/L — CL (ref 135–145)
TCO2: 20 mmol/L (ref 0–100)

## 2016-10-09 LAB — LIPID PANEL
Cholesterol: 138 mg/dL (ref 0–200)
HDL: 47 mg/dL (ref >40)
LDL Cholesterol: 82 mg/dL (ref 0–99)
Total CHOL/HDL Ratio: 2.9 RATIO
Triglycerides: 44 mg/dL (ref <150)
VLDL: 9 mg/dL (ref 0–40)

## 2016-10-09 LAB — TROPONIN I: Troponin I: >65 ng/mL (ref <0.03)

## 2016-10-09 LAB — BASIC METABOLIC PANEL
Anion gap: 8 (ref 5–15)
BUN: 8 mg/dL (ref 6–20)
CO2: 23 mmol/L (ref 22–32)
Calcium: 8.2 mg/dL — ABNORMAL LOW (ref 8.9–10.3)
Chloride: 106 mmol/L (ref 101–111)
Creatinine, Ser: 1.04 mg/dL (ref 0.61–1.24)
GFR calc Af Amer: >60 mL/min (ref >60)
GFR calc non Af Amer: >60 mL/min (ref >60)
Glucose, Bld: 154 mg/dL — ABNORMAL HIGH (ref 65–99)
Potassium: 3.8 mmol/L (ref 3.5–5.1)
Sodium: 137 mmol/L (ref 135–145)

## 2016-10-09 LAB — PROTIME-INR
INR: 1.09
Prothrombin Time: 14.2 seconds (ref 11.4–15.2)

## 2016-10-09 LAB — POCT ACTIVATED CLOTTING TIME: Activated Clotting Time: 554 seconds

## 2016-10-09 LAB — MRSA PCR SCREENING: MRSA BY PCR: NEGATIVE

## 2016-10-09 MED ORDER — ASPIRIN 81 MG PO CHEW
324.0000 mg | CHEWABLE_TABLET | ORAL | Status: AC
  Administered 2016-10-09: 324 mg via ORAL
  Filled 2016-10-09: qty 4

## 2016-10-09 MED ORDER — SODIUM CHLORIDE 0.9 % IV SOLN: 250.0000 mL | INTRAVENOUS | Status: DC | PRN

## 2016-10-09 MED ORDER — HYDRALAZINE HCL 20 MG/ML IJ SOLN: 5.0000 mg | INTRAMUSCULAR | Status: AC | PRN

## 2016-10-09 MED ORDER — ASPIRIN 81 MG PO CHEW
81.0000 mg | CHEWABLE_TABLET | Freq: Every day | ORAL | Status: DC
  Administered 2016-10-09: 81 mg via ORAL
  Filled 2016-10-09 (×2): qty 1

## 2016-10-09 MED ORDER — OXYCODONE HCL 5 MG PO TABS
5.0000 mg | ORAL_TABLET | ORAL | Status: DC | PRN
  Administered 2016-10-09 – 2016-10-11 (×5): 5 mg via ORAL
  Filled 2016-10-09 (×5): qty 1

## 2016-10-09 MED ORDER — ACETAMINOPHEN 325 MG PO TABS
650.0000 mg | ORAL_TABLET | ORAL | Status: DC | PRN
  Filled 2016-10-09: qty 2

## 2016-10-09 MED ORDER — IOPAMIDOL (ISOVUE-370) INJECTION 76%
INTRAVENOUS | Status: DC | PRN
  Administered 2016-10-09: 160 mL via INTRA_ARTERIAL

## 2016-10-09 MED ORDER — LABETALOL HCL 5 MG/ML IV SOLN: 10.0000 mg | INTRAVENOUS | Status: AC | PRN

## 2016-10-09 MED ORDER — ATORVASTATIN CALCIUM 80 MG PO TABS
80.0000 mg | ORAL_TABLET | Freq: Every day | ORAL | Status: DC
  Administered 2016-10-09 – 2016-10-10 (×2): 80 mg via ORAL
  Filled 2016-10-09 (×3): qty 1

## 2016-10-09 MED ORDER — NITROGLYCERIN 0.4 MG SL SUBL: 0.4000 mg | SUBLINGUAL_TABLET | SUBLINGUAL | Status: DC | PRN

## 2016-10-09 MED ORDER — CARVEDILOL 3.125 MG PO TABS
3.1250 mg | ORAL_TABLET | Freq: Two times a day (BID) | ORAL | Status: DC
  Administered 2016-10-09 – 2016-10-11 (×4): 3.125 mg via ORAL
  Filled 2016-10-09 (×4): qty 1

## 2016-10-09 MED ORDER — ACETAMINOPHEN 325 MG PO TABS
650.0000 mg | ORAL_TABLET | ORAL | Status: DC | PRN
  Administered 2016-10-10: 650 mg via ORAL
  Filled 2016-10-09: qty 2

## 2016-10-09 MED ORDER — ONDANSETRON HCL 4 MG/2ML IJ SOLN: 4.0000 mg | Freq: Four times a day (QID) | INTRAMUSCULAR | Status: DC | PRN

## 2016-10-09 MED ORDER — ATORVASTATIN CALCIUM 80 MG PO TABS
80.0000 mg | ORAL_TABLET | Freq: Every day | ORAL | Status: DC
  Administered 2016-10-09: 80 mg via ORAL

## 2016-10-09 MED ORDER — ASPIRIN EC 81 MG PO TBEC
81.0000 mg | DELAYED_RELEASE_TABLET | Freq: Every day | ORAL | Status: DC
  Administered 2016-10-10 – 2016-10-11 (×2): 81 mg via ORAL
  Filled 2016-10-09 (×2): qty 1

## 2016-10-09 MED ORDER — SODIUM CHLORIDE 0.9% FLUSH: 3.0000 mL | INTRAVENOUS | Status: DC | PRN

## 2016-10-09 MED ORDER — LISINOPRIL 2.5 MG PO TABS
2.5000 mg | ORAL_TABLET | Freq: Every day | ORAL | Status: DC
  Administered 2016-10-09 – 2016-10-11 (×3): 2.5 mg via ORAL
  Filled 2016-10-09 (×3): qty 1

## 2016-10-09 MED ORDER — TICAGRELOR 90 MG PO TABS
90.0000 mg | ORAL_TABLET | Freq: Two times a day (BID) | ORAL | Status: DC
  Administered 2016-10-09 – 2016-10-11 (×5): 90 mg via ORAL
  Filled 2016-10-09 (×5): qty 1

## 2016-10-09 MED ORDER — METOPROLOL TARTRATE 12.5 MG HALF TABLET
12.5000 mg | ORAL_TABLET | Freq: Two times a day (BID) | ORAL | Status: DC
Start: 2016-10-09 — End: 2016-10-09
  Administered 2016-10-09 (×2): 12.5 mg via ORAL
  Filled 2016-10-09 (×2): qty 1

## 2016-10-09 MED ORDER — SODIUM CHLORIDE 0.9% FLUSH
3.0000 mL | Freq: Two times a day (BID) | INTRAVENOUS | Status: DC
  Administered 2016-10-09 – 2016-10-11 (×5): 3 mL via INTRAVENOUS

## 2016-10-09 MED ORDER — TICAGRELOR 90 MG PO TABS
ORAL_TABLET | ORAL | Status: DC | PRN
  Administered 2016-10-09: 180 mg via ORAL

## 2016-10-09 MED ORDER — SODIUM CHLORIDE 0.9 % IV SOLN: 4.0000 ug/kg/min | INTRAVENOUS | Status: AC

## 2016-10-09 MED ORDER — ASPIRIN 300 MG RE SUPP: 300.0000 mg | RECTAL | Status: AC

## 2016-10-09 MED ORDER — SODIUM CHLORIDE 0.9 % IV SOLN
INTRAVENOUS | Status: DC | PRN
  Administered 2016-10-09: 4 ug/kg/min via INTRAVENOUS

## 2016-10-09 MED ORDER — SODIUM CHLORIDE 0.9 % IV SOLN: INTRAVENOUS | Status: AC

## 2016-10-09 NOTE — Progress Notes (Signed)
.  EKG CRITICAL VALUE     12 lead EKG performed.  Critical value noted. Marlane HatcherPatty Keaton, RN notified.   SCALES-PRICE,Teirra Carapia, CCT 10/09/2016 8:08 AM

## 2016-10-09 NOTE — Care Management Note (Signed)
Case Management Note Donn PieriniKristi Brinae Woods RN, BSN Unit 2W-Case Manager-- 2H coverage [phone]  Patient Details  Name: Mark Herrera MRN: [id number] Date of Birth: 08/12/1960  Subjective/Objective:  Pt admitted with STEMI, and acute HF                 Action/Plan: PTA pt lived at home, anticipate return home- pt has been started on Brilinta- pt shows that he has Medicaid- Brilinta is covered under preferred drugs with Medicaid- cost $3.70. -- 1715- spoke with pt at bedside-confirmed with pt that he has Medicaid- and explained Brilinta copay and coverage under Medicaid- pt given 30 day free card to use on Brilinta at time of discharge.   Expected Discharge Date:                  Expected Discharge Plan:  Home/Self Care  In-House Referral:     Discharge planning Services  CM Consult, Medication Assistance  Post Acute Care Choice:    Choice offered to:     DME Arranged:    DME Agency:     HH Arranged:    HH Agency:     Status of Service:  In process, will continue to follow  If discussed at Long Length of Stay Meetings, dates discussed:    Discharge Disposition:   Additional Comments:  [name], [name], RN 10/09/2016, 11:57 AM

## 2016-10-09 NOTE — Progress Notes (Signed)
Pt has Medicaid- Brilinta covered under Medicaid benefits At $3.70

## 2016-10-09 NOTE — Plan of Care (Signed)
CRITICAL VALUE ALERT  Critical value received:  Trop > 65 Date of notification:  10/09/2016  Time of notification:  1200  Critical value read back:yes  Nurse who received alert:  Dollene PrimroseHeather B RN  MD notified (1st page):  Cooper  Time of first page: 1210  MD notified (2nd page):  Time of second page:  Responding MD:  Excell Seltzerooper  Time MD responded:  1210

## 2016-10-09 NOTE — Progress Notes (Signed)
1400 Noted that pt's troponin over 65 and he had some reperfusion arrhythmia earlier. Will follow up tomorrow. Luetta Nuttingharlene Raenah Murley RN BSN 10/09/2016 2:01 PM

## 2016-10-09 NOTE — Progress Notes (Signed)
Progress Note  Patient Name: Mark Herrera Date of Encounter: 10/09/2016  Primary Cardiologist: [facility] Lenwood James)  Subjective   Mild chest pain described as 'soreness' No dyspnea  Inpatient Medications    Scheduled Meds: . aspirin  81 mg Oral Daily  . [START ON 10/10/2016] aspirin EC  81 mg Oral Daily  . atorvastatin  80 mg Oral q1800  . atorvastatin  80 mg Oral q1800  . fentaNYL      . metoprolol tartrate  12.5 mg Oral BID  . sodium chloride flush  3 mL Intravenous Q12H  . ticagrelor  90 mg Oral BID   Continuous Infusions: . sodium chloride     PRN Meds: sodium chloride, acetaminophen, acetaminophen, nitroGLYCERIN, ondansetron (ZOFRAN) IV, ondansetron (ZOFRAN) IV, sodium chloride flush   Vital Signs    Vitals:   10/09/16 0700 10/09/16 0741 10/09/16 0800 10/09/16 0900  BP: (!) 126/91  (!) 135/98 118/82  Pulse: 63  63 62  Resp:      Temp:  98.7 F (37.1 C)    TempSrc:  Oral    SpO2: 100%  100% 100%  Weight:      Height:        Intake/Output Summary (Last 24 hours) at 10/09/16 0949 Last data filed at 10/09/16 0900  Gross per 24 hour  Intake           581.25 ml  Output              900 ml  Net          -318.75 ml   Filed Weights   10/09/16 0049  Weight: 158 lb 1.1 oz (71.7 kg)    Telemetry    Sinus rhythm, PVC's, short runs NSVT - Personally Reviewed  ECG    Sinus rhythm with evolving anterolateral STEMI - Personally Reviewed  Physical Exam  NAD, pleasant male GEN: No acute distress.   Neck: No JVD Cardiac: RRR, no murmurs, rubs, or gallops.  Respiratory: Clear to auscultation bilaterally. GI: Soft, nontender, non-distended  MS: No edema; No deformity. Right radial site clear Neuro:  Nonfocal  Psych: Normal affect   Labs    Chemistry Recent Labs Lab 10/08/16 2303  NA 137  K 3.5  CL 106  CO2 22  GLUCOSE 120*  BUN 15  CREATININE 1.16  CALCIUM 8.7*  PROT 5.8*  ALBUMIN 3.2*  AST 30  ALT 21  ALKPHOS 70  BILITOT 0.2*  GFRNONAA  >60  GFRAA >60  ANIONGAP 9     Hematology Recent Labs Lab 10/08/16 2303  WBC 8.2  RBC 4.89  HGB 13.9  HCT 42.6  MCV 87.1  MCH 28.4  MCHC 32.6  RDW 13.5  PLT 254    Cardiac Enzymes Recent Labs Lab 10/08/16 2303  TROPONINI 0.04*   No results for input(s): TROPIPOC in the last 168 hours.   BNPNo results for input(s): BNP, PROBNP in the last 168 hours.   DDimer No results for input(s): DDIMER in the last 168 hours.   Radiology    Dg Chest Port 1 View  Result Date: 10/08/2016 CLINICAL DATA:  Code ST-elevation myocardial infarction. Generalized chest pain and shortness of breath. Initial encounter. EXAM: PORTABLE CHEST 1 VIEW COMPARISON:  Chest radiograph performed 06/24/2015 FINDINGS: The lungs are well-aerated. Minimal bibasilar atelectasis is noted. There is no evidence of pleural effusion or pneumothorax. The cardiomediastinal silhouette is within normal limits. No acute osseous abnormalities are seen. IMPRESSION: Minimal bibasilar atelectasis noted.  Lungs  otherwise clear. Electronically Signed   By: Roanna RaiderJeffery  Chang M.D.   On: 10/08/2016 23:36    Cardiac Studies   Cath note reviewed.   Patient Profile     56 y.o. male with acute anterior STEMI 10/09/16, Primary PCI of the proximal LAD  Assessment & Plan    1. Acute anterior STEMI: reperfusion arrhythmia noted this am. Start coreg. Keep in CCU today. Continue ASA/brilinta.  2. Acute systolic heart failure (LVEDP 19 at cath) secondary to #1, LVEF moderately reduced by ventriculography. 2D echo pending. Start coreg and lisinopril.  3. Hyperlipidemia: start atorvastatin 80 mg  4. Tobacco abuse: cessation counseling done.   Dispo: keep in CCU today, await echo, likely tx tele tomorrow.   Enzo BiSigned, Sofia Vanmeter, MD  10/09/2016, 9:49 AM

## 2016-10-09 NOTE — Plan of Care (Signed)
Problem: Pain Managment: Goal: General experience of comfort will improve Outcome: Progressing Changed PO pain medication; better pain control reported by patient

## 2016-10-09 NOTE — Progress Notes (Signed)
[id number] Pt receptive to education at this time. Daughters in room also. MI education began. Discussed MI restrictions, stent and the importance of brilinta, NTG use, risk factors and smoking cessation and heart healthy diet. Gave pt smoking cessation handout and fake cigarette. Pt plans to use nicotine patches. Will follow up tomorrow and walk if stable to do so. Pt needs brilinta card and to see case manager. Luetta NuttingCharlene Maryon Kemnitz RN BSN 10/09/2016 3:08 PM

## 2016-10-10 ENCOUNTER — Inpatient Hospital Stay (HOSPITAL_COMMUNITY): Payer: Medicaid Other

## 2016-10-10 DIAGNOSIS — I2510 Atherosclerotic heart disease of native coronary artery without angina pectoris: Secondary | ICD-10-CM

## 2016-10-10 LAB — HIV ANTIBODY (ROUTINE TESTING W REFLEX): HIV Screen 4th Generation wRfx: NONREACTIVE

## 2016-10-10 LAB — ECHOCARDIOGRAM COMPLETE
Height: 69 in
WEIGHTICAEL: 2529.12 [oz_av]

## 2016-10-10 NOTE — Progress Notes (Signed)
  Echocardiogram 2D Echocardiogram has been performed.  Nolon RodBrown, Tony 10/10/2016, 12:48 PM

## 2016-10-10 NOTE — Progress Notes (Signed)
Pt tx to [facility] per Md order, pt VSS, pt verbalized understanding of tx, Report called to receiving RN all questions answered

## 2016-10-10 NOTE — Progress Notes (Signed)
CARDIAC REHAB PHASE I   PRE:  Rate/Rhythm: 73 SR  BP:  Supine: 111/69  Sitting:   Standing:    SaO2: 100%RA  MODE:  Ambulation: 350 ft   POST:  Rate/Rhythm: 83 SR  BP:  Supine:   Sitting: 113/75  Standing:    SaO2: 99%RA 1125-1207 Pt walked 350 ft with steady gait. No CP. Had a little weakness in legs that got better with walking. To recliner with call bell. Discussed CRP 2 and will refer to [place]. Gave ex ed. Encouraged walks with staff. Tolerated well.   Luetta Nuttingharlene Gulianna Hornsby, RN BSN  10/10/2016 12:01 PM

## 2016-10-10 NOTE — Progress Notes (Signed)
Progress Note  Patient Name: Mark Herrera Date of Encounter: 10/10/2016  Primary Cardiologist: [facility] Parents Joziyah James)  Subjective   Feeling better. Still mild chest soreness. No dyspnea.   Inpatient Medications    Scheduled Meds: . aspirin  81 mg Oral Daily  . aspirin EC  81 mg Oral Daily  . atorvastatin  80 mg Oral q1800  . carvedilol  3.125 mg Oral BID WC  . lisinopril  2.5 mg Oral Daily  . sodium chloride flush  3 mL Intravenous Q12H  . ticagrelor  90 mg Oral BID   Continuous Infusions: . sodium chloride     PRN Meds: sodium chloride, acetaminophen, acetaminophen, nitroGLYCERIN, ondansetron (ZOFRAN) IV, ondansetron (ZOFRAN) IV, oxyCODONE, sodium chloride flush   Vital Signs    Vitals:   10/10/16 0700 10/10/16 0739 10/10/16 0800 10/10/16 0858  BP: 107/78  (!) 95/56 124/88  Pulse: 61  65 73  Resp:      Temp:  98.5 F (36.9 C)    TempSrc:  Oral    SpO2: 99%  99%   Weight:      Height:        Intake/Output Summary (Last 24 hours) at 10/10/16 0937 Last data filed at 10/10/16 0700  Gross per 24 hour  Intake             1140 ml  Output             2250 ml  Net            -1110 ml   Filed Weights   10/09/16 0049  Weight: 158 lb 1.1 oz (71.7 kg)    Telemetry    Sinus rhythm, PVC burden much less no more NSVT - Personally Reviewed   Physical Exam  My Exam Today: Vitals:   10/10/16 0800 10/10/16 0858  BP: (!) 95/56 124/88  Pulse: 65 73  Resp:    Temp:     Pt is alert and oriented, NAD HEENT: normal Neck: JVP - normal, carotids 2+= without bruits Lungs: CTA bilaterally CV: RRR without murmur or gallop Abd: soft, NT, Positive BS, no hepatomegaly Ext: no C/C/E, distal pulses intact and equal, right radial site clear Skin: warm/dry no rash   Labs    Chemistry  Recent Labs Lab 10/08/16 2303 10/08/16 2344 10/09/16 1019  NA 137 117* 137  K 3.5 3.0* 3.8  CL 106 79* 106  CO2 22  --  23  GLUCOSE 120* 85 154*  BUN 15 16 8   CREATININE 1.16  0.50* 1.04  CALCIUM 8.7*  --  8.2*  PROT 5.8*  --   --   ALBUMIN 3.2*  --   --   AST 30  --   --   ALT 21  --   --   ALKPHOS 70  --   --   BILITOT 0.2*  --   --   GFRNONAA >60  --  >60  GFRAA >60  --  >60  ANIONGAP 9  --  8     Hematology  Recent Labs Lab 10/08/16 2303 10/08/16 2344 10/09/16 1019  WBC 8.2  --  7.5  RBC 4.89  --  4.52  HGB 13.9 13.3 12.8*  HCT 42.6 39.0 39.6  MCV 87.1  --  87.6  MCH 28.4  --  28.3  MCHC 32.6  --  32.3  RDW 13.5  --  13.4  PLT 254  --  251    Cardiac Enzymes  Recent  Labs Lab 10/08/16 2303 10/09/16 1019 10/09/16 1908  TROPONINI 0.04* >65.00* >65.00*   No results for input(s): TROPIPOC in the last 168 hours.   BNPNo results for input(s): BNP, PROBNP in the last 168 hours.   DDimer No results for input(s): DDIMER in the last 168 hours.   Radiology    Dg Chest Port 1 View  Result Date: 10/08/2016 CLINICAL DATA:  Code ST-elevation myocardial infarction. Generalized chest pain and shortness of breath. Initial encounter. EXAM: PORTABLE CHEST 1 VIEW COMPARISON:  Chest radiograph performed 06/24/2015 FINDINGS: The lungs are well-aerated. Minimal bibasilar atelectasis is noted. There is no evidence of pleural effusion or pneumothorax. The cardiomediastinal silhouette is within normal limits. No acute osseous abnormalities are seen. IMPRESSION: Minimal bibasilar atelectasis noted.  Lungs otherwise clear. Electronically Signed   By: Roanna RaiderJeffery  Chang M.D.   On: 10/08/2016 23:36    Cardiac Studies   Cath note reviewed.   Patient Profile     56 y.o. male with acute anterior STEMI 10/09/16, Primary PCI of the proximal LAD  Assessment & Plan    1. Acute anterior STEMI: tolerating ASA, brilinta, coreg, lisinopril, statin. Await 2D echo today. Tx tele today. Home tomorrow likely.  2. Acute systolic heart failure (LVEDP 19 at cath): no clinical evidence of volume overload. Continue current Rx as above. 2D echo today.  3. Hyperlipidemia: on  atorvastatin 80 mg  4. Tobacco abuse: cessation counseling done. He seems motivated to quit  Dispo: tx tele, continue same rx, await 2D echo, likely home tomorrow  Signed, Tonny Bollmanooper, Miriam Kestler, MD  10/10/2016, 9:37 AM

## 2016-10-11 ENCOUNTER — Encounter (HOSPITAL_COMMUNITY): Payer: Self-pay | Admitting: Cardiology

## 2016-10-11 DIAGNOSIS — E785 Hyperlipidemia, unspecified: Secondary | ICD-10-CM

## 2016-10-11 DIAGNOSIS — Z720 Tobacco use: Secondary | ICD-10-CM

## 2016-10-11 MED ORDER — CARVEDILOL 3.125 MG PO TABS: 3.1250 mg | ORAL_TABLET | Freq: Two times a day (BID) | ORAL | 3 refills | Status: DC

## 2016-10-11 MED ORDER — NITROGLYCERIN 0.4 MG SL SUBL: 0.4000 mg | SUBLINGUAL_TABLET | SUBLINGUAL | 3 refills | Status: DC | PRN

## 2016-10-11 MED ORDER — TICAGRELOR 90 MG PO TABS: 90.0000 mg | ORAL_TABLET | Freq: Two times a day (BID) | ORAL | 0 refills | Status: DC

## 2016-10-11 MED ORDER — ASPIRIN 81 MG PO TBEC: 81.0000 mg | DELAYED_RELEASE_TABLET | Freq: Every day | ORAL | Status: DC

## 2016-10-11 MED ORDER — LISINOPRIL 2.5 MG PO TABS: 2.5000 mg | ORAL_TABLET | Freq: Every day | ORAL | 6 refills | Status: DC

## 2016-10-11 MED ORDER — TICAGRELOR 90 MG PO TABS: 90.0000 mg | ORAL_TABLET | Freq: Two times a day (BID) | ORAL | 10 refills | Status: DC

## 2016-10-11 MED ORDER — ATORVASTATIN CALCIUM 80 MG PO TABS: 80.0000 mg | ORAL_TABLET | Freq: Every day | ORAL | 6 refills | Status: DC

## 2016-10-11 NOTE — Progress Notes (Signed)
Confirmed patient has Brilinta card, understood use, and knew his copay.

## 2016-10-11 NOTE — Progress Notes (Signed)
Patient provided with MATCH letter.  

## 2016-10-11 NOTE — Discharge Summary (Signed)
Discharge Summary    Patient ID: Mark Herrera,  MRN: [id number], DOB/AGE: 11-14-1960 56 y.o.  Admit date: 10/08/2016 Discharge date: 10/11/2016  Primary Care Provider: System, Pcp Not In Primary Cardiologist: [facility]  Discharge Diagnoses    Active Problems:   STEMI (ST elevation myocardial infarction) (HCC)   Acute ST elevation myocardial infarction (STEMI) involving left anterior descending (LAD) coronary artery (HCC)   Hyperlipemia   Tobacco abuse   Allergies No Known Allergies  Diagnostic Studies/Procedures    LHC: 10/08/16  Conclusion     Prox RCA lesion, 30 %stenosed.  Mid RCA lesion, 30 %stenosed.  Ost LM lesion, 20 %stenosed.  Ost Cx to Prox Cx lesion, 20 %stenosed.  A STENT SYNERGY DES 3.5X16 drug eluting stent was successfully placed.  Prox LAD lesion, 100 %stenosed.  Post intervention, there is a 0% residual stenosis.  There is moderate left ventricular systolic dysfunction.  LV end diastolic pressure is mildly elevated.  The left ventricular ejection fraction is 35-45% by visual estimate.  There is no mitral valve regurgitation.   1. Acute anterior STEMI 2. Total occlusion of the proximal LAD, heavy thrombus 3. Mild non-obstructive disease in the Circumflex and RCA 4. Moderate segmental LV systolic dysfunction 5. Successful PTCA/DES x 1 proximal LAD  Recommendations: Will continue Cangrelor drip until the patient agrees to take Brilinta loading dose. He has been uncooperative during the cath and will not take Brilinta. Will continue ASA. He will need one year of DAPT. Will start low dose beta blocker as BP tolerates. Will start high dose statin. Will admit to the ICU tonight. Echo tomorrow.    TTE: 10/10/16   Study Conclusions  - Left ventricle: The cavity size was normal. Wall thickness was   normal. Systolic function was mildly reduced. The estimated   ejection fraction was in the range of 45% to 50%. There is   akinesis of the  mid-apicalanterolateral and apical myocardium.   There was no evidence of elevated ventricular filling pressure by   Doppler parameters. - Aortic valve: Trileaflet; mildly thickened, mildly calcified   leaflets. - Mitral valve: There was trivial regurgitation.  _____________   History of Present Illness     56 yo male with history of tobacco abuse who had the onset of severe substernal chest pain 45 minutes prior to admisson. He called EMS. EKG with anterior ST elevation. Code STEMI activated by EMS. Pt with ongoing chest pain upon arrival to ED. He reported no chronic medical problems but has smoked for 40 years. He does not see a physician regularly. He also noted dyspnea. No other complaints. He was taken directly to the cath lab for intervention with Dr. [facility] Course     Underwent cardiac cath noted above with DES x 1 placed pLAD. Plan for DAPT with ASA and Brilinta. Noted some reperfusion arrhythmias the following morning. LVEDP was 19 on cath, but had no signs of volume overload on exam. Was started on coreg, with high dose statin. Blood pressure tolerated well. Follow up echo showed mildly decreased LV function at 45-50%. He worked will with cardiac rehab. Was transferred to telemetry on 10/10/16. Did continue to have some mild chest soreness, but improved. He reported being committed to smoking cessation.   He was seen and assessed by Dr. Excell Seltzer on 10/11/16 and determined stable for discharge home. Follow up was arranged in the office. Medications are listed below.  General: Well developed, well nourished, AA male appearing in  no acute distress. Head: Normocephalic, atraumatic.  Neck: Supple without bruits, JVD. Lungs:  Resp regular and unlabored, CTA. Heart: RRR, S1, S2, no S3, S4, or murmur; no rub. Abdomen: Soft, non-tender, non-distended with normoactive bowel sounds. No hepatomegaly. No rebound/guarding. No obvious abdominal masses. Extremities: No clubbing,  cyanosis, edema. Distal pedal pulses are 2+ bilaterally. Radial cath site stable without bruising or hematoma Neuro: Alert and oriented X 3. Moves all extremities spontaneously. Psych: Normal affect.  _____________  Discharge Vitals Blood pressure 104/65, pulse 68, temperature 98.2 F (36.8 C), resp. rate 17, height 5\' 9"  (1.753 m), weight 155 lb 4.8 oz (70.4 kg), SpO2 100 %.  Filed Weights   10/09/16 0049 10/11/16 0551  Weight: 158 lb 1.1 oz (71.7 kg) 155 lb 4.8 oz (70.4 kg)    Labs & Radiologic Studies    CBC  Recent Labs  10/08/16 2303 10/08/16 2344 10/09/16 1019  WBC 8.2  --  7.5  NEUTROABS 5.2  --   --   HGB 13.9 13.3 12.8*  HCT 42.6 39.0 39.6  MCV 87.1  --  87.6  PLT 254  --  251   Basic Metabolic Panel  Recent Labs  10/08/16 2303 10/08/16 2344 10/09/16 1019  NA 137 117* 137  K 3.5 3.0* 3.8  CL 106 79* 106  CO2 22  --  23  GLUCOSE 120* 85 154*  BUN 15 16 8   CREATININE 1.16 0.50* 1.04  CALCIUM 8.7*  --  8.2*   Liver Function Tests  Recent Labs  10/08/16 2303  AST 30  ALT 21  ALKPHOS 70  BILITOT 0.2*  PROT 5.8*  ALBUMIN 3.2*   No results for input(s): LIPASE, AMYLASE in the last 72 hours. Cardiac Enzymes  Recent Labs  10/08/16 2303 10/09/16 1019 10/09/16 1908  TROPONINI 0.04* >65.00* >65.00*   BNP Invalid input(s): POCBNP D-Dimer No results for input(s): DDIMER in the last 72 hours. Hemoglobin A1C No results for input(s): HGBA1C in the last 72 hours. Fasting Lipid Panel  Recent Labs  10/09/16 1019  CHOL 138  HDL 47  LDLCALC 82  TRIG 44  CHOLHDL 2.9   Thyroid Function Tests No results for input(s): TSH, T4TOTAL, T3FREE, THYROIDAB in the last 72 hours.  Invalid input(s): FREET3 _____________  Dg Chest Port 1 View  Result Date: 10/08/2016 CLINICAL DATA:  Code ST-elevation myocardial infarction. Generalized chest pain and shortness of breath. Initial encounter. EXAM: PORTABLE CHEST 1 VIEW COMPARISON:  Chest radiograph  performed 06/24/2015 FINDINGS: The lungs are well-aerated. Minimal bibasilar atelectasis is noted. There is no evidence of pleural effusion or pneumothorax. The cardiomediastinal silhouette is within normal limits. No acute osseous abnormalities are seen. IMPRESSION: Minimal bibasilar atelectasis noted.  Lungs otherwise clear. Electronically Signed   By: Roanna RaiderJeffery  Chang M.D.   On: 10/08/2016 23:36   Disposition   Pt is being discharged home today in good condition.  Follow-up Plans & Appointments    Follow-up Information    Leone Brandngold, Laura R, NP Follow up on 10/21/2016.   Specialties:  Cardiology, Radiology Why:  at 8am for your follow up appt.  Contact information: [address] [place] [address] [phone]          Discharge Instructions    Amb Referral to Cardiac Rehabilitation    Complete by:  As directed    Diagnosis:   STEMI Coronary Stents     Call MD for:  redness, tenderness, or signs of infection (pain, swelling, redness, odor  or green/yellow discharge around incision site)    Complete by:  As directed    Diet - low sodium heart healthy    Complete by:  As directed    Discharge instructions    Complete by:  As directed    Radial Site Care Refer to this sheet in the next few weeks. These instructions provide you with information on caring for yourself after your procedure. Your caregiver may also give you more specific instructions. Your treatment has been planned according to current medical practices, but problems sometimes occur. Call your caregiver if you have any problems or questions after your procedure. HOME CARE INSTRUCTIONS You may shower the day after the procedure.Remove the bandage (dressing) and gently wash the site with plain soap and water.Gently pat the site dry.  Do not apply powder or lotion to the site.  Do not submerge the affected site in water for 3 to 5 days.  Inspect the site at least twice daily.  Do not flex or bend the  affected arm for 24 hours.  No lifting over 5 pounds (2.3 kg) for 5 days after your procedure.  Do not drive home if you are discharged the same day of the procedure. Have someone else drive you.  You may drive 24 hours after the procedure unless otherwise instructed by your caregiver.  What to expect: Any bruising will usually fade within 1 to 2 weeks.  Blood that collects in the tissue (hematoma) may be painful to the touch. It should usually decrease in size and tenderness within 1 to 2 weeks.  SEEK IMMEDIATE MEDICAL CARE IF: You have unusual pain at the radial site.  You have redness, warmth, swelling, or pain at the radial site.  You have drainage (other than a small amount of blood on the dressing).  You have chills.  You have a fever or persistent symptoms for more than 72 hours.  You have a fever and your symptoms suddenly get worse.  Your arm becomes pale, cool, tingly, or numb.  You have heavy bleeding from the site. Hold pressure on the site.   Increase activity slowly    Complete by:  As directed       Discharge Medications   Current Discharge Medication List    START taking these medications   Details  aspirin EC 81 MG EC tablet Take 1 tablet (81 mg total) by mouth daily.    atorvastatin (LIPITOR) 80 MG tablet Take 1 tablet (80 mg total) by mouth daily at 6 PM. Qty: 30 tablet, Refills: 6    carvedilol (COREG) 3.125 MG tablet Take 1 tablet (3.125 mg total) by mouth 2 (two) times daily with a meal. Qty: 60 tablet, Refills: 3    lisinopril (PRINIVIL,ZESTRIL) 2.5 MG tablet Take 1 tablet (2.5 mg total) by mouth daily. Qty: 30 tablet, Refills: 6    nitroGLYCERIN (NITROSTAT) 0.4 MG SL tablet Place 1 tablet (0.4 mg total) under the tongue every 5 (five) minutes x 3 doses as needed for chest pain. Qty: 25 tablet, Refills: 3    ticagrelor (BRILINTA) 90 MG TABS tablet Take 1 tablet (90 mg total) by mouth 2 (two) times daily. Qty: 60 tablet, Refills: 0      STOP taking  these medications     HYDROcodone-acetaminophen (NORCO/VICODIN) 5-325 MG tablet      methocarbamol (ROBAXIN) 500 MG tablet      naproxen (NAPROSYN) 500 MG tablet          Aspirin prescribed  at discharge?  Yes High Intensity Statin Prescribed? (Lipitor 40-80mg  or Crestor 20-40mg ): Yes Beta Blocker Prescribed? Yes For EF <40%, was ACEI/ARB Prescribed? Yes ADP Receptor Inhibitor Prescribed? (i.e. Plavix etc.-Includes Medically Managed Patients): Yes For EF <40%, Aldosterone Inhibitor Prescribed? No: EF ok Was EF assessed during THIS hospitalization? Yes Was Cardiac Rehab II ordered? (Included Medically managed Patients): Yes   Outstanding Labs/Studies   FLP/LFTs in 6 weeks if tolerating statin.   Duration of Discharge Encounter   Greater than 30 minutes including physician time.  Signed, Laverda Page NP-C 10/11/2016, 9:40 AM  Patient seen, examined. Available data reviewed. Agree with findings, assessment, and plan as outlined by Laverda Page, NP-C. My exam today: Vitals:   10/11/16 0551 10/11/16 0639  BP: (!) 95/57 104/65  Pulse: 61 68  Resp: 17   Temp: 98.2 F (36.8 C)    Pt is alert and oriented, NAD HEENT: normal Neck: JVP - normal Lungs: CTA bilaterally CV: RRR without murmur or gallop Abd: soft, NT Ext: no C/C/E, distal pulses intact and equal Skin: warm/dry no rash  The patient is doing well this morning. He is clinically stable. Blood pressure is in the low-normal range. I think he is ready for discharge and we will send him out on low doses of carvedilol and lisinopril as outlined. He should continue dual antiplatelet therapy with aspirin and brilinta for at least one year. He will be discharged on a high intensity statin drug. Early hospital follow-up will be arranged. His echo is reviewed and he has only mild LV systolic dysfunction with a segmental wall motion pattern consistent with his anterior MI.  Tonny Bollman, M.D. 10/11/2016 10:03 AM

## 2016-10-11 NOTE — Progress Notes (Signed)
Discharge order received. Next doses for meds updated on AVS and AVS printed. RN educated patient on his discharge instructions. RN provided patient with printed prescription and instructed him that the rest of his prescriptions were sent to his [facility] pharmacy, as directed on his AVS. Patient verbalized understanding of discharge paperwork and was able to teach back information to RN. Patient's nephew at bedside and will be the one taking patient home.

## 2016-10-11 NOTE — Progress Notes (Signed)
CARDIAC REHAB PHASE I   PRE:  Rate/Rhythm: 75 SR  BP:  Supine:   Sitting: 102/70  Standing:    SaO2:   MODE:  Ambulation: 550 ft   POST:  Rate/Rhythm: 75 SR  BP:  Supine:   Sitting: 100/75  Standing:    SaO2: 99%RA 0959-1020 Pt walked 550 ft with steady gait independently. No CP. Anxious to go home. Looks great.   Luetta Nuttingharlene Mykel Mohl, RN BSN  10/11/2016 10:18 AM

## 2016-10-20 NOTE — Progress Notes (Deleted)
Cardiology Office Note   Date:  10/20/2016   ID:  Mark FannyClifton J Peedin, DOB 06/09/1960, MRN [id number]  PCP:  System, Pcp Not In  Cardiologist:  Dr. Kamaal JamesMcAlhany    No chief complaint on file.     History of Present Illness: Mark Herrera is a 56 y.o. male who presents for post hospitalization from 10/08/16 to 10/11/16 for STEMI of LAD.  Also with ischemic cardiomyopathy with EF 35-45%.   He has residual non obstructive RCA and LCX disease. He had DES to LAD.  Plan for 1 year DAPT.     With echo 10/10/16 EF to 45-50%.    Pt with no prior CAD or medical problems.  + tobacco use for 40 years.  Presented with severe substernal chest pain. D/c'd on BB, ASA, brilinta, ace  and high dose statin.   Troponin pk > 65.   Glucose was up to 154.    Today** Exercise diet   tobacco    Past Medical History:  Diagnosis Date  . CAD (coronary artery disease)    10/08/16 STEMI DES x1 to pLAD EF 45%  . Gout   . Tobacco abuse     Past Surgical History:  Procedure Laterality Date  . CORONARY STENT INTERVENTION N/A 10/08/2016   Procedure: Coronary Stent Intervention;  Surgeon: Kathleene HazelMcAlhany, Christopher D, MD;  Location: [facility];  Service: Cardiovascular;  Laterality: N/A;  . LEFT HEART CATH AND CORONARY ANGIOGRAPHY N/A 10/08/2016   Procedure: Left Heart Cath and Coronary Angiography;  Surgeon: Kathleene HazelMcAlhany, Christopher D, MD;  [facility];  Service: Cardiovascular;  Laterality: N/A;  . TOE SURGERY       Current Outpatient Prescriptions  Medication Sig Dispense Refill  . aspirin EC 81 MG EC tablet Take 1 tablet (81 mg total) by mouth daily.    Marland Kitchen. atorvastatin (LIPITOR) 80 MG tablet Take 1 tablet (80 mg total) by mouth daily at 6 PM. 30 tablet 6  . carvedilol (COREG) 3.125 MG tablet Take 1 tablet (3.125 mg total) by mouth 2 (two) times daily with a meal. 60 tablet 3  . lisinopril (PRINIVIL,ZESTRIL) 2.5 MG tablet Take 1 tablet (2.5 mg total) by mouth daily. 30 tablet 6  . nitroGLYCERIN  (NITROSTAT) 0.4 MG SL tablet Place 1 tablet (0.4 mg total) under the tongue every 5 (five) minutes x 3 doses as needed for chest pain. 25 tablet 3  . ticagrelor (BRILINTA) 90 MG TABS tablet Take 1 tablet (90 mg total) by mouth 2 (two) times daily. 60 tablet 0   No current facility-administered medications for this visit.     Allergies:   Patient has no known allergies.    Social History:  The patient  reports that he has been smoking Cigarettes.  He has been smoking about 1.00 pack per day. He has never used smokeless tobacco. He reports that he drinks alcohol. He reports that he uses drugs, including Marijuana.   Family History:  The patient's ***family history includes Hypertension in his father.    ROS:  General:no colds or fevers, no weight changes Skin:no rashes or ulcers HEENT:no blurred vision, no congestion CV:see HPI PUL:see HPI GI:no diarrhea constipation or melena, no indigestion GU:no hematuria, no dysuria MS:no joint pain, no claudication Neuro:no syncope, no lightheadedness Endo:no diabetes, no thyroid disease Wt Readings from Last 3 Encounters:  10/11/16 155 lb 4.8 oz (70.4 kg)  11/26/14 160 lb (72.6 kg)     PHYSICAL EXAM: VS:  There were  no vitals taken for this visit. , BMI There is no height or weight on file to calculate BMI. General:Pleasant affect, NAD Skin:Warm and dry, brisk capillary refill HEENT:normocephalic, sclera clear, mucus membranes moist Neck:supple, no JVD, no bruits  Heart:S1S2 RRR without murmur, gallup, rub or click Lungs:clear without rales, rhonchi, or wheezes ZOX:WRUE, non tender, + BS, do not palpate liver spleen or masses Ext:no lower ext edema, 2+ pedal pulses, 2+ radial pulses Neuro:alert and oriented, MAE, follows commands, + facial symmetry    EKG:  EKG is ordered today. The ekg ordered today demonstrates ***   Recent Labs: 10/08/2016: ALT 21 10/09/2016: BUN 8; Creatinine, Ser 1.04; Hemoglobin 12.8; Platelets 251; Potassium  3.8; Sodium 137    Lipid Panel    Component Value Date/Time   CHOL 138 10/09/2016 1019   TRIG 44 10/09/2016 1019   HDL 47 10/09/2016 1019   CHOLHDL 2.9 10/09/2016 1019   VLDL 9 10/09/2016 1019   LDLCALC 82 10/09/2016 1019       Other studies Reviewed: Additional studies/ records that were reviewed today include: ***.   ASSESSMENT AND PLAN:  1.  S/p STEMI  2. CAD  3. HLD   4. Tobacco    Current medicines are reviewed with the patient today.  The patient Has no concerns regarding medicines.  The following changes have been made:  See above Labs/ tests ordered today include:see above  Disposition:   FU:  see above  Signed, Nada Boozer, NP  10/20/2016 9:53 PM    [facility] [address] [place], [place], [place]  [address]/ [address] [place], [place] Phone: [phone]; Fax: [phone]  [phone]

## 2016-10-21 ENCOUNTER — Ambulatory Visit: Payer: Self-pay | Admitting: Cardiology

## 2016-10-23 ENCOUNTER — Encounter: Payer: Self-pay | Admitting: Cardiology

## 2017-12-22 ENCOUNTER — Encounter (HOSPITAL_COMMUNITY): Payer: Self-pay | Admitting: Emergency Medicine

## 2017-12-22 ENCOUNTER — Ambulatory Visit (HOSPITAL_COMMUNITY)
Admission: EM | Admit: 2017-12-22 | Discharge: 2017-12-22 | Disposition: A | Discharge status: Home / Self Care | Payer: Self-pay | Attending: Family Medicine | Admitting: Family Medicine

## 2017-12-22 DIAGNOSIS — S39012A Strain of muscle, fascia and tendon of lower back, initial encounter: Secondary | ICD-10-CM

## 2017-12-22 MED ORDER — CYCLOBENZAPRINE HCL 10 MG PO TABS: 10.0000 mg | ORAL_TABLET | Freq: Three times a day (TID) | ORAL | 0 refills | Status: DC

## 2017-12-22 MED ORDER — DICLOFENAC SODIUM 75 MG PO TBEC: 75.0000 mg | DELAYED_RELEASE_TABLET | Freq: Two times a day (BID) | ORAL | 0 refills | Status: DC

## 2017-12-22 NOTE — ED Triage Notes (Signed)
PT reports left lower back pain after lifting something yesterday.

## 2017-12-22 NOTE — Discharge Instructions (Addendum)

## 2017-12-22 NOTE — ED Provider Notes (Signed)
[facility]   [id number] 12/22/17 Arrival Time: 0949  ASSESSMENT & PLAN:  1. Strain of lumbar region, initial encounter     Meds ordered this encounter  Medications  . cyclobenzaprine (FLEXERIL) 10 MG tablet    Sig: Take 1 tablet (10 mg total) by mouth 3 (three) times daily.    Dispense:  20 tablet    Refill:  0  . diclofenac (VOLTAREN) 75 MG EC tablet    Sig: Take 1 tablet (75 mg total) by mouth 2 (two) times daily.    Dispense:  14 tablet    Refill:  0   Medication sedation precautions. Encouraged ROM as tolerated. Will f/u here if not improving over the next week, sooner if needed.  Reviewed expectations re: course of current medical issues. Questions answered. Outlined signs and symptoms indicating need for more acute intervention. Patient verbalized understanding. After Visit Summary given.   SUBJECTIVE: History from: patient.  Mark Herrera is a 57 y.o. male who presents with complaint of intermittent left sided lower back discomfort. Onset abrupt beginning yesterday. Injury/trama: noticed pain after heavy lifting. History of back problems: occasional LBP. Previous back surgery: no. Discomfort described as aching and stiffness without radiation. Certain movements exacerbate the described discomfort. Better with rest. Extremity sensation changes or weakness: none. Ambulatory without difficulty. Normal bowel/bladder habits. No associated abdominal pain/n/v. Self treatment: has not tried OTCs for relief of pain.  Patient reports no fevers, IV drug use, recent back surgeries or procedures, urinary incontinence, or bowel incontinence.  ROS: As per HPI.   OBJECTIVE:  Vitals:   12/22/17 1002 12/22/17 1006  BP: 98/63   Pulse: 67   Resp: 18   Temp: 98.3 F (36.8 C)   TempSrc: Oral   SpO2: 97%   Weight:  159 lb (72.1 kg)    General appearance: alert; no distress Neck: supple with FROM; without midline tenderness Lungs: unlabored respirations;  symmetrical air entry Abdomen: soft, non-tender; bowel sounds normal; no masses or organomegaly; no guarding or rebound tenderness Back: left sided lower tenderness present over lumbar musculature; FROM at hips with mild discomfort reported; bruising: none; without midline tenderness Extremities: no cyanosis or edema; symmetrical with no gross deformities Skin: warm and dry Neurologic: normal gait; normal symmetric reflexes; normal LE strength and sensation Psychological: alert and cooperative; normal mood and affect   No Known Allergies  Past Medical History:  Diagnosis Date  . CAD (coronary artery disease)    10/08/16 STEMI DES x1 to pLAD EF 45%  . Gout   . Tobacco abuse    Social History   Socioeconomic History  . Marital status: Single    Spouse name: Not on file  . Number of children: Not on file  . Years of education: Not on file  . Highest education level: Not on file  Occupational History  . Not on file  Social Needs  . Financial resource strain: Not on file  . Food insecurity:    Worry: Not on file    Inability: Not on file  . Transportation needs:    Medical: Not on file    Non-medical: Not on file  Tobacco Use  . Smoking status: Current Every Day Smoker    Packs/day: 1.00    Types: Cigarettes  . Smokeless tobacco: Never Used  Substance and Sexual Activity  . Alcohol use: Yes    Comment: occ  . Drug use: Yes    Types: Marijuana  . Sexual activity: Not on file  Lifestyle  . Physical activity:    Days per week: Not on file    Minutes per session: Not on file  . Stress: Not on file  Relationships  . Social connections:    Talks on phone: Not on file    Gets together: Not on file    Attends religious service: Not on file    Active member of club or organization: Not on file    Attends meetings of clubs or organizations: Not on file    Relationship status: Not on file  . Intimate partner violence:    Fear of current or ex partner: Not on file     Emotionally abused: Not on file    Physically abused: Not on file    Forced sexual activity: Not on file  Other Topics Concern  . Not on file  Social History Narrative  . Not on file   Family History  Problem Relation Age of Onset  . Hypertension Father    Past Surgical History:  Procedure Laterality Date  . CORONARY STENT INTERVENTION N/A 10/08/2016   Procedure: Coronary Stent Intervention;  Surgeon: Kathleene Hazel, MD;  Location: [facility];  Service: Cardiovascular;  Laterality: N/A;  . LEFT HEART CATH AND CORONARY ANGIOGRAPHY N/A 10/08/2016   Procedure: Left Heart Cath and Coronary Angiography;  Surgeon: Kathleene Hazel, MD;  Location: [facility] INVASIVE CV LAB;  Service: Cardiovascular;  Laterality: N/A;  . TOE SURGERY       Mardella Layman, MD 12/22/17 1034

## 2018-11-02 ENCOUNTER — Encounter (HOSPITAL_COMMUNITY): Payer: Self-pay

## 2018-11-02 ENCOUNTER — Emergency Department (HOSPITAL_COMMUNITY): Payer: Self-pay

## 2018-11-02 ENCOUNTER — Emergency Department (HOSPITAL_COMMUNITY)
Admission: EM | Admit: 2018-11-02 | Discharge: 2018-11-02 | Disposition: A | Discharge status: Home / Self Care | Payer: Self-pay | Attending: Emergency Medicine | Admitting: Emergency Medicine

## 2018-11-02 ENCOUNTER — Other Ambulatory Visit: Payer: Self-pay

## 2018-11-02 DIAGNOSIS — I252 Old myocardial infarction: Secondary | ICD-10-CM | POA: Insufficient documentation

## 2018-11-02 DIAGNOSIS — Y929 Unspecified place or not applicable: Secondary | ICD-10-CM | POA: Insufficient documentation

## 2018-11-02 DIAGNOSIS — Z79899 Other long term (current) drug therapy: Secondary | ICD-10-CM | POA: Insufficient documentation

## 2018-11-02 DIAGNOSIS — W228XXA Striking against or struck by other objects, initial encounter: Secondary | ICD-10-CM | POA: Insufficient documentation

## 2018-11-02 DIAGNOSIS — Z7982 Long term (current) use of aspirin: Secondary | ICD-10-CM | POA: Insufficient documentation

## 2018-11-02 DIAGNOSIS — Y9301 Activity, walking, marching and hiking: Secondary | ICD-10-CM | POA: Insufficient documentation

## 2018-11-02 DIAGNOSIS — Y999 Unspecified external cause status: Secondary | ICD-10-CM | POA: Insufficient documentation

## 2018-11-02 DIAGNOSIS — I2510 Atherosclerotic heart disease of native coronary artery without angina pectoris: Secondary | ICD-10-CM | POA: Insufficient documentation

## 2018-11-02 DIAGNOSIS — S42294A Other nondisplaced fracture of upper end of right humerus, initial encounter for closed fracture: Secondary | ICD-10-CM | POA: Insufficient documentation

## 2018-11-02 DIAGNOSIS — F17210 Nicotine dependence, cigarettes, uncomplicated: Secondary | ICD-10-CM | POA: Insufficient documentation

## 2018-11-02 MED ORDER — OXYCODONE-ACETAMINOPHEN 5-325 MG PO TABS
1.0000 | ORAL_TABLET | Freq: Once | ORAL | Status: AC
  Administered 2018-11-02: 1 via ORAL
  Filled 2018-11-02: qty 1

## 2018-11-02 MED ORDER — HYDROCODONE-ACETAMINOPHEN 5-325 MG PO TABS: 1.0000 | ORAL_TABLET | Freq: Four times a day (QID) | ORAL | 0 refills | Status: DC | PRN

## 2018-11-02 NOTE — ED Triage Notes (Signed)
Pt arrives POV for eval of R sided shoulder pain s/p falling into a car while walking with granson. Pt denies N/T. ? Dislocation in triage. Pt splinting w/ alternative arm

## 2018-11-02 NOTE — ED Notes (Signed)
ED Provider at bedside. 

## 2018-11-02 NOTE — Discharge Instructions (Signed)
Please read instructions below. Apply ice to your shoulder for 20 minutes at a time.  Do not put any weight in your right arm until cleared by an orthopedic specialist. You can take ibuprofen every 6 hours as needed for pain.  You can take hydrocodone every 6 hours as needed for severe pain.  Be aware there is Tylenol in this medication.  Do not drive or drink alcohol with this medication, as it can make you drowsy. Schedule an appointment with the orthopedic specialist in 1 to 2 weeks for repeat x-ray and follow-up on your injury. Return to the ER for new or concerning symptoms.

## 2018-11-02 NOTE — ED Notes (Signed)
Patient verbalizes understanding of discharge instructions . Opportunity for questions and answers were provided . Armband removed by staff ,Pt discharged from ED. W/C  offered at D/C  and Declined W/C at D/C and was escorted to lobby by RN.  

## 2018-11-02 NOTE — ED Provider Notes (Signed)
[facility] EMERGENCY DEPARTMENT Provider Note   CSN: [id number] Arrival date & time: 11/02/18  1602    History   Chief Complaint Chief Complaint  Patient presents with   Shoulder Pain    HPI CASHTYN Herrera is a 58 y.o. male presenting to the ED with sudden onset of right shoulder pain after injury that occurred yesterday. Pt states he fell into a car, hitting the lateral aspect of his right shoulder on the car. He has had pain to the right shoulder since that time that is, worse with movement.  He also has some soreness around his right shoulder blade.  He is taking ibuprofen for symptoms with mild relief.  No other injuries reported.  No numbness or tingling to right hand.  No prior injuries to the right shoulder.     The history is provided by the patient.    Past Medical History:  Diagnosis Date   CAD (coronary artery disease)    10/08/16 STEMI DES x1 to pLAD EF 45%   Gout    Tobacco abuse     Patient Active Problem List   Diagnosis Date Noted   Hyperlipemia 10/11/2016   Tobacco abuse 10/11/2016   STEMI (ST elevation myocardial infarction) (HCC) 10/09/2016   Acute ST elevation myocardial infarction (STEMI) involving left anterior descending (LAD) coronary artery [facility])     Past Surgical History:  Procedure Laterality Date   CORONARY STENT INTERVENTION N/A 10/08/2016   Procedure: Coronary Stent Intervention;  Surgeon: Kathleene Hazel, MD;  Location: [facility] INVASIVE CV LAB;  Service: Cardiovascular;  Laterality: N/A;   LEFT HEART CATH AND CORONARY ANGIOGRAPHY N/A 10/08/2016   Procedure: Left Heart Cath and Coronary Angiography;  Surgeon: Kathleene Hazel, MD;  Location: [facility] INVASIVE CV LAB;  Service: Cardiovascular;  Laterality: N/A;   TOE SURGERY          Home Medications    Prior to Admission medications   Medication Sig Start Date End Date Taking? Authorizing Provider  aspirin EC 81 MG EC tablet Take 1 tablet (81 mg total)  by mouth daily. 10/12/16   Arty Baumgartner, NP  atorvastatin (LIPITOR) 80 MG tablet Take 1 tablet (80 mg total) by mouth daily at 6 PM. 10/11/16   Arty Baumgartner, NP  carvedilol (COREG) 3.125 MG tablet Take 1 tablet (3.125 mg total) by mouth 2 (two) times daily with a meal. 10/11/16   Arty Baumgartner, NP  cyclobenzaprine (FLEXERIL) 10 MG tablet Take 1 tablet (10 mg total) by mouth 3 (three) times daily. 12/22/17   Mardella Layman, MD  diclofenac (VOLTAREN) 75 MG EC tablet Take 1 tablet (75 mg total) by mouth 2 (two) times daily. 12/22/17   Mardella Layman, MD  HYDROcodone-acetaminophen (NORCO/VICODIN) 5-325 MG tablet Take 1-2 tablets by mouth every 6 (six) hours as needed. 11/02/18   Shakesha Soltau, [place] N, PA-C  lisinopril (PRINIVIL,ZESTRIL) 2.5 MG tablet Take 1 tablet (2.5 mg total) by mouth daily. 10/12/16   Arty Baumgartner, NP  nitroGLYCERIN (NITROSTAT) 0.4 MG SL tablet Place 1 tablet (0.4 mg total) under the tongue every 5 (five) minutes x 3 doses as needed for chest pain. 10/11/16   Arty Baumgartner, NP  ticagrelor (BRILINTA) 90 MG TABS tablet Take 1 tablet (90 mg total) by mouth 2 (two) times daily. 10/11/16   Arty Baumgartner, NP    Family History Family History  Problem Relation Age of Onset   Hypertension Father     Social  History Social History   Tobacco Use   Smoking status: Current Every Day Smoker    Packs/day: 1.00    Types: Cigarettes   Smokeless tobacco: Never Used  Substance Use Topics   Alcohol use: Yes    Comment: occ   Drug use: Yes    Types: Marijuana     Allergies   Patient has no known allergies.   Review of Systems Review of Systems  Musculoskeletal: Positive for arthralgias.  Neurological: Negative for weakness and numbness.     Physical Exam Updated Vital Signs BP 118/82 (BP Location: Right Arm)    Pulse 63    Temp 98.4 F (36.9 C) (Oral)    Resp 14    Ht  (1.753 m)    Wt 75.3 kg    SpO2 99%    BMI 24.51 kg/m   Physical  Exam Vitals signs and nursing note reviewed.  Constitutional:      General: He is not in acute distress.    Appearance: He is well-developed.  HENT:     Head: Normocephalic and atraumatic.  Eyes:     Conjunctiva/sclera: Conjunctivae normal.  Cardiovascular:     Rate and Rhythm: Normal rate and regular rhythm.  Pulmonary:     Effort: Pulmonary effort is normal. No respiratory distress.     Breath sounds: Normal breath sounds.  Chest:     Chest wall: No tenderness.  Musculoskeletal:     Comments: Right shoulder no deformity or swelling.  There is tenderness to the lateral posterior aspect of the shoulder and in the scapular region.  Pain with range of motion.  Normal sensation to the deltoid.  Normal range of motion of the elbow and wrist.  Strong equal grip strength bilaterally.  Strong radial pulses and normal sensation.  No midline spinal tenderness.  Clavicle without deformity or tenderness.  Neurological:     Mental Status: He is alert.  Psychiatric:        Mood and Affect: Mood normal.        Behavior: Behavior normal.      ED Treatments / Results  Labs (all labs ordered are listed, but only abnormal results are displayed) Labs Reviewed - No data to display  EKG None  Radiology Dg Shoulder Right  Result Date: 11/02/2018 CLINICAL DATA:  Status post fall from a curb.  Shoulder pain. EXAM: RIGHT SHOULDER - 2+ VIEW COMPARISON:  None. FINDINGS: There is cortical irregularity along the posterior humeral head concerning for a nondisplaced fracture. There is no evidence of arthropathy or other focal bone abnormality. Soft tissues are unremarkable. IMPRESSION: Cortical irregularity along the posterior humeral head concerning for a nondisplaced fracture. Electronically Signed   By: Elige Ko   On: 11/02/2018 16:40    Procedures Procedures (including critical care time)  Medications Ordered in ED Medications  oxyCODONE-acetaminophen (PERCOCET/ROXICET) 5-325 MG per tablet 1  tablet (has no administration in time range)     Initial Impression / Assessment and Plan / ED Course  I have reviewed the triage vital signs and the nursing notes.  Pertinent labs & imaging results that were available during my care of the patient were reviewed by me and considered in my medical decision making (see chart for details).        Patient with mechanical fall into a car with right shoulder pain.  X-ray with possible nondisplaced fracture of the posterior humeral head with cortical irregularity.  Neurovascularly intact.  No other injuries.  Will  place in sling for immobilization, pain medication prescribed.  Recommend ice, nonweightbearing right arm, Ortho follow-up in 1 to 2 weeks for repeat x-ray.  Patient agreeable to plan and safe for discharge.  Discussed results, findings, treatment and follow up. Patient advised of return precautions. Patient verbalized understanding and agreed with plan.  [place] Controlled Substance reporting System queried   Final Clinical Impressions(s) / ED Diagnoses   Final diagnoses:  Other closed nondisplaced fracture of proximal end of right humerus, initial encounter    ED Discharge Orders         Ordered    HYDROcodone-acetaminophen (NORCO/VICODIN) 5-325 MG tablet  Every 6 hours PRN     11/02/18 1900           Nahomi Hegner, [place] N, PA-C 11/02/18 1936    Derwood KaplanNanavati, Ankit, MD 11/03/18 1743

## 2019-05-31 ENCOUNTER — Emergency Department (HOSPITAL_COMMUNITY): Payer: Medicaid Other

## 2019-05-31 ENCOUNTER — Inpatient Hospital Stay (HOSPITAL_COMMUNITY)
Admission: EM | Admit: 2019-05-31 | Discharge: 2019-06-01 | DRG: 063 | Disposition: A | Discharge status: Home / Self Care | Payer: Medicaid Other | Attending: Neurology | Admitting: Neurology

## 2019-05-31 ENCOUNTER — Other Ambulatory Visit: Payer: Self-pay

## 2019-05-31 ENCOUNTER — Encounter (HOSPITAL_COMMUNITY): Payer: Self-pay | Admitting: Emergency Medicine

## 2019-05-31 DIAGNOSIS — I252 Old myocardial infarction: Secondary | ICD-10-CM

## 2019-05-31 DIAGNOSIS — I10 Essential (primary) hypertension: Secondary | ICD-10-CM | POA: Diagnosis present

## 2019-05-31 DIAGNOSIS — E785 Hyperlipidemia, unspecified: Secondary | ICD-10-CM | POA: Diagnosis present

## 2019-05-31 DIAGNOSIS — F14988 Cocaine use, unspecified with other cocaine-induced disorder: Secondary | ICD-10-CM | POA: Diagnosis present

## 2019-05-31 DIAGNOSIS — Z20828 Contact with and (suspected) exposure to other viral communicable diseases: Secondary | ICD-10-CM | POA: Diagnosis present

## 2019-05-31 DIAGNOSIS — I639 Cerebral infarction, unspecified: Secondary | ICD-10-CM | POA: Diagnosis present

## 2019-05-31 DIAGNOSIS — Z79899 Other long term (current) drug therapy: Secondary | ICD-10-CM

## 2019-05-31 DIAGNOSIS — Z9114 Patient's other noncompliance with medication regimen: Secondary | ICD-10-CM

## 2019-05-31 DIAGNOSIS — Z8249 Family history of ischemic heart disease and other diseases of the circulatory system: Secondary | ICD-10-CM | POA: Diagnosis not present

## 2019-05-31 DIAGNOSIS — Z720 Tobacco use: Secondary | ICD-10-CM | POA: Diagnosis present

## 2019-05-31 DIAGNOSIS — Z955 Presence of coronary angioplasty implant and graft: Secondary | ICD-10-CM

## 2019-05-31 DIAGNOSIS — R29703 NIHSS score 3: Secondary | ICD-10-CM | POA: Diagnosis present

## 2019-05-31 DIAGNOSIS — M109 Gout, unspecified: Secondary | ICD-10-CM | POA: Diagnosis present

## 2019-05-31 DIAGNOSIS — I2510 Atherosclerotic heart disease of native coronary artery without angina pectoris: Secondary | ICD-10-CM | POA: Diagnosis present

## 2019-05-31 DIAGNOSIS — G459 Transient cerebral ischemic attack, unspecified: Secondary | ICD-10-CM | POA: Diagnosis present

## 2019-05-31 DIAGNOSIS — F17210 Nicotine dependence, cigarettes, uncomplicated: Secondary | ICD-10-CM | POA: Diagnosis present

## 2019-05-31 DIAGNOSIS — Z79891 Long term (current) use of opiate analgesic: Secondary | ICD-10-CM | POA: Diagnosis not present

## 2019-05-31 DIAGNOSIS — F1410 Cocaine abuse, uncomplicated: Secondary | ICD-10-CM | POA: Diagnosis present

## 2019-05-31 DIAGNOSIS — Z7982 Long term (current) use of aspirin: Secondary | ICD-10-CM

## 2019-05-31 DIAGNOSIS — R2990 Unspecified symptoms and signs involving the nervous system: Secondary | ICD-10-CM | POA: Diagnosis present

## 2019-05-31 LAB — CBC
HCT: 46.3 % (ref 39.0–52.0)
Hemoglobin: 14.9 g/dL (ref 13.0–17.0)
MCH: 28.9 pg (ref 26.0–34.0)
MCHC: 32.2 g/dL (ref 30.0–36.0)
MCV: 89.7 fL (ref 80.0–100.0)
Platelets: 233 10*3/uL (ref 150–400)
RBC: 5.16 MIL/uL (ref 4.22–5.81)
RDW: 14.2 % (ref 11.5–15.5)
WBC: 4.8 10*3/uL (ref 4.0–10.5)
nRBC: 0 % (ref 0.0–0.2)

## 2019-05-31 LAB — URINALYSIS, ROUTINE W REFLEX MICROSCOPIC
Bacteria, UA: NONE SEEN
Bilirubin Urine: NEGATIVE
Glucose, UA: NEGATIVE mg/dL
Ketones, ur: NEGATIVE mg/dL
Leukocytes,Ua: NEGATIVE
Nitrite: NEGATIVE
Protein, ur: NEGATIVE mg/dL
Specific Gravity, Urine: >1.046 — ABNORMAL HIGH (ref 1.005–1.030)
pH: 5 (ref 5.0–8.0)

## 2019-05-31 LAB — RAPID URINE DRUG SCREEN, HOSP PERFORMED
Amphetamines: NOT DETECTED
Barbiturates: NOT DETECTED
Benzodiazepines: NOT DETECTED
Cocaine: POSITIVE — AB
Opiates: NOT DETECTED
Tetrahydrocannabinol: POSITIVE — AB

## 2019-05-31 LAB — TROPONIN I (HIGH SENSITIVITY)
Troponin I (High Sensitivity): 13 ng/L (ref <18)
Troponin I (High Sensitivity): 13 ng/L (ref <18)

## 2019-05-31 LAB — I-STAT CHEM 8, ED
BUN: 18 mg/dL (ref 6–20)
Calcium, Ion: 0.95 mmol/L — ABNORMAL LOW (ref 1.15–1.40)
Chloride: 107 mmol/L (ref 98–111)
Creatinine, Ser: 1.1 mg/dL (ref 0.61–1.24)
Glucose, Bld: 83 mg/dL (ref 70–99)
HCT: 43 % (ref 39.0–52.0)
Hemoglobin: 14.6 g/dL (ref 13.0–17.0)
Potassium: 4.5 mmol/L (ref 3.5–5.1)
Sodium: 137 mmol/L (ref 135–145)
TCO2: 26 mmol/L (ref 22–32)

## 2019-05-31 LAB — CBC WITH DIFFERENTIAL/PLATELET
Abs Immature Granulocytes: 0.02 10*3/uL (ref 0.00–0.07)
Basophils Absolute: 0 10*3/uL (ref 0.0–0.1)
Basophils Relative: 0 %
Eosinophils Absolute: 0.2 10*3/uL (ref 0.0–0.5)
Eosinophils Relative: 3 %
HCT: 47.6 % (ref 39.0–52.0)
Hemoglobin: 15.1 g/dL (ref 13.0–17.0)
Immature Granulocytes: 0 %
Lymphocytes Relative: 21 %
Lymphs Abs: 1.4 10*3/uL (ref 0.7–4.0)
MCH: 29 pg (ref 26.0–34.0)
MCHC: 31.7 g/dL (ref 30.0–36.0)
MCV: 91.4 fL (ref 80.0–100.0)
Monocytes Absolute: 0.5 10*3/uL (ref 0.1–1.0)
Monocytes Relative: 8 %
Neutro Abs: 4.3 10*3/uL (ref 1.7–7.7)
Neutrophils Relative %: 68 %
Platelets: 221 10*3/uL (ref 150–400)
RBC: 5.21 MIL/uL (ref 4.22–5.81)
RDW: 14.2 % (ref 11.5–15.5)
WBC: 6.4 10*3/uL (ref 4.0–10.5)
nRBC: 0 % (ref 0.0–0.2)

## 2019-05-31 LAB — SARS CORONAVIRUS 2 (TAT 6-24 HRS): SARS Coronavirus 2: NEGATIVE

## 2019-05-31 LAB — PROTIME-INR
INR: 1.1 (ref 0.8–1.2)
Prothrombin Time: 14.5 seconds (ref 11.4–15.2)

## 2019-05-31 LAB — BASIC METABOLIC PANEL
Anion gap: 9 (ref 5–15)
BUN: 13 mg/dL (ref 6–20)
CO2: 25 mmol/L (ref 22–32)
Calcium: 9.1 mg/dL (ref 8.9–10.3)
Chloride: 103 mmol/L (ref 98–111)
Creatinine, Ser: 1.08 mg/dL (ref 0.61–1.24)
GFR calc Af Amer: >60 mL/min (ref >60)
GFR calc non Af Amer: >60 mL/min (ref >60)
Glucose, Bld: 95 mg/dL (ref 70–99)
Potassium: 4.3 mmol/L (ref 3.5–5.1)
Sodium: 137 mmol/L (ref 135–145)

## 2019-05-31 LAB — APTT: aPTT: 25 seconds (ref 24–36)

## 2019-05-31 LAB — HIV ANTIBODY (ROUTINE TESTING W REFLEX): HIV Screen 4th Generation wRfx: NONREACTIVE

## 2019-05-31 LAB — ETHANOL: Alcohol, Ethyl (B): <10 mg/dL (ref <10)

## 2019-05-31 MED ORDER — ACETAMINOPHEN 650 MG RE SUPP: 650.0000 mg | RECTAL | Status: DC | PRN

## 2019-05-31 MED ORDER — ATORVASTATIN CALCIUM 80 MG PO TABS
80.0000 mg | ORAL_TABLET | Freq: Every day | ORAL | Status: DC
  Administered 2019-05-31: 18:00:00 80 mg via ORAL
  Filled 2019-05-31: qty 1

## 2019-05-31 MED ORDER — ACETAMINOPHEN 160 MG/5ML PO SOLN: 650.0000 mg | ORAL | Status: DC | PRN

## 2019-05-31 MED ORDER — DIPHENHYDRAMINE HCL 50 MG/ML IJ SOLN: 50.0000 mg | INTRAMUSCULAR | Status: AC

## 2019-05-31 MED ORDER — ACETAMINOPHEN 325 MG PO TABS: 650.0000 mg | ORAL_TABLET | ORAL | Status: DC | PRN

## 2019-05-31 MED ORDER — SODIUM CHLORIDE 0.9 % IV SOLN
50.0000 mL | Freq: Once | INTRAVENOUS | Status: AC
  Administered 2019-05-31: 10:00:00 50 mL via INTRAVENOUS

## 2019-05-31 MED ORDER — ALTEPLASE (STROKE) FULL DOSE INFUSION
0.9000 mg/kg | Freq: Once | INTRAVENOUS | Status: AC
  Administered 2019-05-31: 09:00:00 65.4 mg via INTRAVENOUS
  Filled 2019-05-31: qty 100

## 2019-05-31 MED ORDER — METHYLPREDNISOLONE SODIUM SUCC 125 MG IJ SOLR
INTRAMUSCULAR | Status: AC
  Filled 2019-05-31: qty 2

## 2019-05-31 MED ORDER — NITROGLYCERIN 0.4 MG SL SUBL: 0.4000 mg | SUBLINGUAL_TABLET | SUBLINGUAL | Status: DC | PRN

## 2019-05-31 MED ORDER — DIPHENHYDRAMINE HCL 50 MG/ML IJ SOLN
INTRAMUSCULAR | Status: AC
  Administered 2019-05-31: 50 mg via INTRAVENOUS
  Filled 2019-05-31: qty 1

## 2019-05-31 MED ORDER — LISINOPRIL 2.5 MG PO TABS: 2.5000 mg | ORAL_TABLET | Freq: Every day | ORAL | Status: DC

## 2019-05-31 MED ORDER — IOHEXOL 350 MG/ML SOLN
75.0000 mL | Freq: Once | INTRAVENOUS | Status: AC | PRN
  Administered 2019-05-31: 09:00:00 75 mL via INTRAVENOUS

## 2019-05-31 MED ORDER — FAMOTIDINE IN NACL 20-0.9 MG/50ML-% IV SOLN
20.0000 mg | INTRAVENOUS | Status: AC
  Administered 2019-05-31: 20 mg via INTRAVENOUS
  Filled 2019-05-31: qty 50

## 2019-05-31 MED ORDER — SODIUM CHLORIDE 0.9% FLUSH: 3.0000 mL | Freq: Once | INTRAVENOUS | Status: DC

## 2019-05-31 MED ORDER — METHYLPREDNISOLONE SODIUM SUCC 125 MG IJ SOLR
125.0000 mg | INTRAMUSCULAR | Status: AC
  Administered 2019-05-31: 10:00:00 125 mg via INTRAVENOUS

## 2019-05-31 MED ORDER — SODIUM CHLORIDE 0.9 % IV SOLN: INTRAVENOUS | Status: DC

## 2019-05-31 MED ORDER — STROKE: EARLY STAGES OF RECOVERY BOOK
Freq: Once | Status: DC
  Filled 2019-05-31: qty 1

## 2019-05-31 MED ORDER — CARVEDILOL 3.125 MG PO TABS: 3.1250 mg | ORAL_TABLET | Freq: Two times a day (BID) | ORAL | Status: DC

## 2019-05-31 MED ORDER — PANTOPRAZOLE SODIUM 40 MG IV SOLR
40.0000 mg | Freq: Every day | INTRAVENOUS | Status: DC
  Administered 2019-05-31: 40 mg via INTRAVENOUS
  Filled 2019-05-31: qty 40

## 2019-05-31 MED ORDER — CHLORHEXIDINE GLUCONATE CLOTH 2 % EX PADS
6.0000 | MEDICATED_PAD | Freq: Every day | CUTANEOUS | Status: DC
  Administered 2019-06-01: 6 via TOPICAL

## 2019-05-31 MED ORDER — SENNOSIDES-DOCUSATE SODIUM 8.6-50 MG PO TABS: 1.0000 | ORAL_TABLET | Freq: Every evening | ORAL | Status: DC | PRN

## 2019-05-31 NOTE — Code Documentation (Signed)
Stroke Response Nurse Documentation Code Documentation  Mark Herrera is a 58 y.o. male arriving to [place] ED via [facility] on 05/31/19. Code stroke was activated by  ED. Patient from home where he was [place] 05/31/19 at 0700. Stroke symptoms include weakness of right arm(s) or right lower leg(s), decreased sensation of the right arm and leg. On No antithrombotic. Stroke team arrived to CT while pt on table. Labs drawn prior to code stroke activation. NIHSS 3 per neurology, see documentation for details and code stroke times. Patient with weakness of right arm(s) or right lower leg(s), decreased sensation of the right side on exam. The following imaging was completed: CT and CTA head and neck. Patient is a candidate for IV tPA: yes. Plan to admit to [place] 3W aware of plan.    Earma Reading  Stroke Response RN

## 2019-05-31 NOTE — Progress Notes (Signed)
Right lower lip swelling prior to arrival. After tPA, noted upper lip swelling as well. No tongue swelling. No breathing difficulty. Orolingual angioedema orderset with methylpred/benadryl/famotidine ordered. Better to monitor in ICU than stepdown.  -- Amie Portland, MD [facility] Pager: [phone] If 7pm to 7am, please call on call as listed on AMION.

## 2019-05-31 NOTE — ED Notes (Signed)
Neuro team at bedside to reassess

## 2019-05-31 NOTE — ED Provider Notes (Signed)
[facility] EMERGENCY DEPARTMENT Provider Note   CSN: [id number] Arrival date & time: 05/31/19  1856     History Chief Complaint  Patient presents with  . Code Stroke  . multiple complaints    Mark Herrera is a 58 y.o. male.  HPI Patient presents with right-sided weakness and numbness.  Initially states he woke up with it this morning.  With further questioning however began while he was starting to have sex with his very good-looking girlfriend.  States that he was normal when he went to bed last night.  Normal when he got up at around 430 go the bathroom and normal at around 7:00 when he got up to go to the bathroom again.  States he then had difficulty moving the right side.  States it was just weak and felt numb.  Also was a little sore.  No trauma.  States his girlfriend had to roll him because he could not move that side.  No headache.  Has not episodes like this before.  History of coronary artery disease but has been off his medicines recently.  Reportedly had been cramping.     Past Medical History:  Diagnosis Date  . CAD (coronary artery disease)    10/08/16 STEMI DES x1 to pLAD EF 45%  . Gout   . Tobacco abuse     Patient Active Problem List   Diagnosis Date Noted  . Acute ischemic stroke ([place]) 05/31/2019  . Hyperlipemia 10/11/2016  . Tobacco abuse 10/11/2016  . STEMI (ST elevation myocardial infarction) ([place]) 10/09/2016  . Acute ST elevation myocardial infarction (STEMI) involving left anterior descending (LAD) coronary artery [facility])     Past Surgical History:  Procedure Laterality Date  . CORONARY STENT INTERVENTION N/A 10/08/2016   Procedure: Coronary Stent Intervention;  Surgeon: Burnell Blanks, MD;  Location: [place] CV LAB;  Service: Cardiovascular;  Laterality: N/A;  . LEFT HEART CATH AND CORONARY ANGIOGRAPHY N/A 10/08/2016   Procedure: Left Heart Cath and Coronary Angiography;  Surgeon: Burnell Blanks, MD;  Location:  [place] CV LAB;  Service: Cardiovascular;  Laterality: N/A;  . TOE SURGERY         Family History  Problem Relation Age of Onset  . Hypertension Father     Social History   Tobacco Use  . Smoking status: Current Every Day Smoker    Packs/day: 1.00    Types: Cigarettes  . Smokeless tobacco: Never Used  Substance Use Topics  . Alcohol use: Yes    Comment: occ  . Drug use: Yes    Types: Marijuana    Home Medications Prior to Admission medications   Medication Sig Start Date End Date Taking? Authorizing Provider  aspirin EC 81 MG EC tablet Take 1 tablet (81 mg total) by mouth daily. 10/12/16   Cheryln Manly, NP  atorvastatin (LIPITOR) 80 MG tablet Take 1 tablet (80 mg total) by mouth daily at 6 PM. 10/11/16   Cheryln Manly, NP  carvedilol (COREG) 3.125 MG tablet Take 1 tablet (3.125 mg total) by mouth 2 (two) times daily with a meal. 10/11/16   Cheryln Manly, NP  cyclobenzaprine (FLEXERIL) 10 MG tablet Take 1 tablet (10 mg total) by mouth 3 (three) times daily. 12/22/17   Vanessa Kick, MD  diclofenac (VOLTAREN) 75 MG EC tablet Take 1 tablet (75 mg total) by mouth 2 (two) times daily. 12/22/17   Vanessa Kick, MD  HYDROcodone-acetaminophen (NORCO/VICODIN) 5-325 MG tablet Take  1-2 tablets by mouth every 6 (six) hours as needed. 11/02/18   Robinson, [place] N, PA-C  lisinopril (PRINIVIL,ZESTRIL) 2.5 MG tablet Take 1 tablet (2.5 mg total) by mouth daily. 10/12/16   Arty Baumgartneroberts, Lindsay B, NP  nitroGLYCERIN (NITROSTAT) 0.4 MG SL tablet Place 1 tablet (0.4 mg total) under the tongue every 5 (five) minutes x 3 doses as needed for chest pain. 10/11/16   Arty Baumgartneroberts, Lindsay B, NP  ticagrelor (BRILINTA) 90 MG TABS tablet Take 1 tablet (90 mg total) by mouth 2 (two) times daily. 10/11/16   Arty Baumgartneroberts, Lindsay B, NP    Allergies    Patient has no known allergies.  Review of Systems   Review of Systems  Constitutional: Negative for appetite change.  HENT: Negative for congestion.     Respiratory: Negative for shortness of breath and stridor.   Cardiovascular: Negative for chest pain.  Gastrointestinal: Negative for abdominal distention.  Genitourinary: Negative for flank pain.  Musculoskeletal: Negative for arthralgias.  Skin: Negative for pallor.  Neurological: Positive for weakness and numbness. Negative for headaches.  Psychiatric/Behavioral: Negative for confusion.    Physical Exam Updated Vital Signs BP 134/86 (BP Location: Left Arm)   Pulse 62   Temp 97.6 F (36.4 C) (Oral)   Resp 18   Ht 5' 8.5" (1.74 m)   Wt 72.7 kg   SpO2 100%   BMI 24.02 kg/m   Physical Exam Vitals and nursing note reviewed.  HENT:     Head: Atraumatic.     Mouth/Throat:     Comments: Some swelling of right lower lip.  No biting on tongue. Eyes:     Extraocular Movements: Extraocular movements intact.     Pupils: Pupils are equal, round, and reactive to light.  Cardiovascular:     Rate and Rhythm: Regular rhythm.  Pulmonary:     Breath sounds: Normal breath sounds.  Musculoskeletal:     Cervical back: Neck supple.  Skin:    General: Skin is warm.     Capillary Refill: Capillary refill takes less than 2 seconds.  Neurological:     Mental Status: He is alert and oriented to person, place, and time.     Comments: Face symmetric.  Eye movements intact.  Visual fields grossly intact by confrontation.  Slight decreased sensation on right upper arm.  Strength decreased on right upper and lower extremity compared to left although overall does have some good strength on that side.  There is asymmetry.  Complete NIH scoring done by neurology.  Psychiatric:        Mood and Affect: Mood normal.     ED Results / Procedures / Treatments   Labs (all labs ordered are listed, but only abnormal results are displayed) Labs Reviewed  SARS CORONAVIRUS 2 (TAT 6-24 HRS)  BASIC METABOLIC PANEL  CBC  ETHANOL  PROTIME-INR  APTT  DIFFERENTIAL  RAPID URINE DRUG SCREEN, HOSP PERFORMED   URINALYSIS, ROUTINE W REFLEX MICROSCOPIC  HIV ANTIBODY (ROUTINE TESTING W REFLEX)  I-STAT CHEM 8, ED  TROPONIN I (HIGH SENSITIVITY)  TROPONIN I (HIGH SENSITIVITY)    EKG None  Radiology DG Chest 2 View  Result Date: 05/31/2019 CLINICAL DATA:  Chest pain and weakness EXAM: CHEST - 2 VIEW COMPARISON:  10/08/2016 FINDINGS: The heart size and mediastinal contours are within normal limits. Both lungs are clear. The visualized skeletal structures are unremarkable. IMPRESSION: No active cardiopulmonary disease. Electronically Signed   By: Alcide CleverMark  Lukens M.D.   On: 05/31/2019 08:08  CT HEAD CODE STROKE WO CONTRAST  Result Date: 05/31/2019 CLINICAL DATA:  Code stroke. Neurological deficit. Acute stroke suspected. Abnormal sensations on the left. EXAM: CT HEAD WITHOUT CONTRAST TECHNIQUE: Contiguous axial images were obtained from the base of the skull through the vertex without intravenous contrast. COMPARISON:  None. FINDINGS: Brain: No evidence of brain atrophy. Mild chronic appearing small vessel change of the white matter. One could question few small foci of early low-density in the left posterior frontal region. This is not definite. No evidence of large vessel territory infarction. No mass, hemorrhage, hydrocephalus or extra-axial collection. Vascular: No abnormal vascular finding. Skull: Normal Sinuses/Orbits: Clear/normal Other: None ASPECTS ([place] Stroke Program Early CT Score) - Ganglionic level infarction (caudate, lentiform nuclei, internal capsule, insula, M1-M3 cortex): 7 - Supraganglionic infarction (M4-M6 cortex): 2 or 3 Total score (0-10 with 10 being normal): 9 or 10 IMPRESSION: 1. No definite acute finding by CT. Mild chronic small-vessel change of the hemispheric white matter. One could question a small focus of developing cortical low-density in the left posterior frontal region but this is definite. 2. ASPECTS is or 10 3. These results were communicated to Dr. Wilford Corner at 8:52 amon  12/28/2020by text page via the [facility] messaging system. Electronically Signed   By: Paulina Fusi M.D.   On: 05/31/2019 08:54    Procedures Procedures (including critical care time)  Medications Ordered in ED Medications  sodium chloride flush (NS) 0.9 % injection 3 mL (has no administration in time range)   stroke: mapping our early stages of recovery book (has no administration in time range)  0.9 %  sodium chloride infusion (has no administration in time range)  acetaminophen (TYLENOL) tablet 650 mg (has no administration in time range)    Or  acetaminophen (TYLENOL) 160 MG/5ML solution 650 mg (has no administration in time range)    Or  acetaminophen (TYLENOL) suppository 650 mg (has no administration in time range)  senna-docusate (Senokot-S) tablet 1 tablet (has no administration in time range)  pantoprazole (PROTONIX) injection 40 mg (has no administration in time range)  alteplase (ACTIVASE) 1 mg/mL infusion 65.4 mg (has no administration in time range)    Followed by  0.9 %  sodium chloride infusion (has no administration in time range)  atorvastatin (LIPITOR) tablet 80 mg (has no administration in time range)  iohexol (OMNIPAQUE) 350 MG/ML injection 75 mL (75 mLs Intravenous Contrast Given 05/31/19 0905)    ED Course  I have reviewed the triage vital signs and the nursing notes.  Pertinent labs & imaging results that were available during my care of the patient were reviewed by me and considered in my medical decision making (see chart for details).    MDM Rules/Calculators/A&P                     Patient came in with right-sided weakness.  Does have some asymmetry.  Discussed with Dr.Aurora from neurology.  Decision to call code stroke made it around 835.  Taken to CT scan by myself and neurology.  However the page had not actually been called out.  Initial head CT showed no bleed.  I went and got the pharmacist and she got TPA.Marland Kitchen Swelling of right lower lip was present  before TPA been given and does not appear to be a reaction to the medicine.   CRITICAL CARE Performed by: Benjiman Core Total critical care time: 30 minutes Critical care time was exclusive of separately billable procedures and treating other  patients. Critical care was necessary to treat or prevent imminent or life-threatening deterioration. Critical care was time spent personally by me on the following activities: development of treatment plan with patient and/or surrogate as well as nursing, discussions with consultants, evaluation of patient's response to treatment, examination of patient, obtaining history from patient or surrogate, ordering and performing treatments and interventions, ordering and review of laboratory studies, ordering and review of radiographic studies, pulse oximetry and re-evaluation of patient's condition.  Final Clinical Impression(s) / ED Diagnoses Final diagnoses:  Acute ischemic stroke [facility])    Rx / DC Orders ED Discharge Orders    None       Benjiman Core, MD 05/31/19 [phone]

## 2019-05-31 NOTE — Progress Notes (Signed)
Lower lip remains swollen but improved after meds. Upper lip mild swelling. No Tongue swelling. No airway compromise. Plan still to admit to ICU  -- Amie Portland, MD [facility] Pager: [phone] If 7pm to 7am, please call on call as listed on AMION.

## 2019-05-31 NOTE — Code Documentation (Signed)
Pt stated he hit his lip prior to admit to ED. R lower lip noted to be swollen prior to administration of tpa. Notified Dr. Rory Percy of increase in edema while tpa was infusing. Believed to be related to prior injury and not reaction to tpa. Ice applied. During reassessment, lip noted to be more edematous and also noted in R upper lip as well. Notified Dr. Rory Percy. Angioedema protocol ordered. ED RN aware. Plan to admit to 4NICU for closer monitoring. Both 3W and ICU aware of new plan. See MAR for meds given.  Leverne Humbles Stroke Response RN

## 2019-05-31 NOTE — ED Triage Notes (Signed)
To ED via [facility] with c/o right sided pain and cramping-- no weakness noted, passed EMS stroke screen--

## 2019-05-31 NOTE — H&P (Signed)
Neurology history and physical  Reason for admission: Code stroke with admission of TPA  History is obtained from: Patient  HPI: Mark Herrera is a 58 y.o. male with past medical history of CAD, gout and tobacco abuse.  Patient is also noncompliant with his medications.  He states that he last known normal at 7 AM.  At that point he started to have intercourse with his girlfriend when he suddenly noted that his right arm and right leg became paretic.  His girlfriend became very suspicious of stroke.  Initial evaluation not concerning on arrival but later continued weakness like to code stroke being called after the ED provider called the on-call neuro hospitalist who recommended a code stroke was called. Patient denies any headaches or chest pain. On initial exam patient stated he had improved significantly which was very obvious as he was able to lift his arm and leg greater than what ED physician had noted previously.  Patient denied any headache prior foliar/during/after event.  ED course-labs, CT of head, CTA of head and neck  Chart review-no previous neurological notes  LKW: 7 AM tpa given?:  Yes Premorbid modified Rankin scale (mRS): 0 NIH stroke scale of 3   Past Medical History:  Diagnosis Date  . CAD (coronary artery disease)    10/08/16 STEMI DES x1 to pLAD EF 45%  . Gout   . Tobacco abuse     Family History  Problem Relation Age of Onset  . Hypertension Father    Social History:   reports that he has been smoking cigarettes. He has been smoking about 1.00 pack per day. He has never used smokeless tobacco. He reports current alcohol use. He reports current drug use. Drug: Marijuana.  Medications  Current Facility-Administered Medications:  .  sodium chloride flush (NS) 0.9 % injection 3 mL, 3 mL, Intravenous, Once, Benjiman Core, MD  Current Outpatient Medications:  .  aspirin EC 81 MG EC tablet, Take 1 tablet (81 mg total) by mouth daily., Disp: , Rfl:  .   atorvastatin (LIPITOR) 80 MG tablet, Take 1 tablet (80 mg total) by mouth daily at 6 PM., Disp: 30 tablet, Rfl: 6 .  carvedilol (COREG) 3.125 MG tablet, Take 1 tablet (3.125 mg total) by mouth 2 (two) times daily with a meal., Disp: 60 tablet, Rfl: 3 .  cyclobenzaprine (FLEXERIL) 10 MG tablet, Take 1 tablet (10 mg total) by mouth 3 (three) times daily., Disp: 20 tablet, Rfl: 0 .  diclofenac (VOLTAREN) 75 MG EC tablet, Take 1 tablet (75 mg total) by mouth 2 (two) times daily., Disp: 14 tablet, Rfl: 0 .  HYDROcodone-acetaminophen (NORCO/VICODIN) 5-325 MG tablet, Take 1-2 tablets by mouth every 6 (six) hours as needed., Disp: 10 tablet, Rfl: 0 .  lisinopril (PRINIVIL,ZESTRIL) 2.5 MG tablet, Take 1 tablet (2.5 mg total) by mouth daily., Disp: 30 tablet, Rfl: 6 .  nitroGLYCERIN (NITROSTAT) 0.4 MG SL tablet, Place 1 tablet (0.4 mg total) under the tongue every 5 (five) minutes x 3 doses as needed for chest pain., Disp: 25 tablet, Rfl: 3 .  ticagrelor (BRILINTA) 90 MG TABS tablet, Take 1 tablet (90 mg total) by mouth 2 (two) times daily., Disp: 60 tablet, Rfl: 0  ROS:   .  General ROS: negative for - chills, fatigue, fever, night sweats, weight gain or weight loss Psychological ROS: negative for - behavioral disorder, hallucinations, memory difficulties, mood swings or suicidal ideation Ophthalmic ROS: negative for - blurry vision, double vision, eye pain or  loss of vision ENT ROS: negative for - epistaxis, nasal discharge, oral lesions, sore throat, tinnitus or vertigo Respiratory ROS: negative for - cough, hemoptysis, shortness of breath or wheezing Cardiovascular ROS: negative for - chest pain, dyspnea on exertion, edema or irregular heartbeat Gastrointestinal ROS: negative for - abdominal pain, diarrhea, hematemesis, nausea/vomiting or stool incontinence Genito-Urinary ROS: negative for - dysuria, hematuria, incontinence or urinary frequency/urgency Musculoskeletal ROS: Positive for -  muscular  weakness Neurological ROS: as noted in HPI Dermatological ROS: negative for rash and skin lesion changes   Exam: Current vital signs: BP 125/85 (BP Location: Left Arm)   Pulse 65   Temp 97.6 F (36.4 C) (Oral)   Resp 18   Ht 5' 8.5" (1.74 m)   Wt 73 kg   SpO2 100%   BMI 24.12 kg/m  Vital signs in last 24 hours: Temp:  [97.6 F (36.4 C)] 97.6 F (36.4 C) (12/28 0734) Pulse Rate:  [65] 65 (12/28 0734) Resp:  [18] 18 (12/28 0734) BP: (125)/(85) 125/85 (12/28 0734) SpO2:  [100 %] 100 % (12/28 0734) Weight:  [73 kg] 73 kg (12/28 0739)  Constitutional: Appears well-developed and well-nourished.  Psych: Affect appropriate to situation Eyes: No scleral injection HENT: No OP obstrucion Head: Normocephalic.  Cardiovascular: Normal rate and regular rhythm.  Respiratory: Effort normal, non-labored breathing GI: Soft.  No distension. There is no tenderness.  Skin: WDI  Neuro: Mental Status: Patient is awake, alert, oriented to person, place, month, year, and situation. Patient is able to give a clear and coherent history. No signs of aphasia or neglect Cranial Nerves: II: Visual Fields are full.  III,IV, VI: EOMI without ptosis or diploplia. Pupils equal, round and reactive to light V: Facial sensation is symmetric to temperature VII: Facial movement is symmetric. Has swelling of right lower lip --prior to tPA administration VIII: hearing is intact to voice X: Palat elevates symmetrically XI: Shoulder shrug is symmetric. XII: tongue is midline without atrophy or fasciculations.  Motor: Tone is normal. Bulk is normal. 5/5 strength was present on left arm and leg, 4/5 on right arm and leg with drift on the right arm and leg. Sensory: Decreased sensation on the right arm and leg Deep Tendon Reflexes: 2+ and symmetric in the biceps and patellae.  Plantars: Toes are downgoing bilaterally.  Cerebellar: FNF and HKS are intact bilaterally  Labs I have reviewed labs in epic  and the results pertinent to this consultation are:  CBC    Component Value Date/Time   WBC 4.8 05/31/2019 0757   RBC 5.16 05/31/2019 0757   HGB 14.9 05/31/2019 0757   HCT 46.3 05/31/2019 0757   PLT 233 05/31/2019 0757   MCV 89.7 05/31/2019 0757   MCH 28.9 05/31/2019 0757   MCHC 32.2 05/31/2019 0757   RDW 14.2 05/31/2019 0757   LYMPHSABS 2.4 10/08/2016 2303   MONOABS 0.5 10/08/2016 2303   EOSABS 0.2 10/08/2016 2303   BASOSABS 0.0 10/08/2016 2303    CMP     Component Value Date/Time   NA 137 05/31/2019 0757   K 4.3 05/31/2019 0757   CL 103 05/31/2019 0757   CO2 25 05/31/2019 0757   GLUCOSE 95 05/31/2019 0757   BUN 13 05/31/2019 0757   CREATININE 1.08 05/31/2019 0757   CALCIUM 9.1 05/31/2019 0757   PROT 5.8 (L) 10/08/2016 2303   ALBUMIN 3.2 (L) 10/08/2016 2303   AST 30 10/08/2016 2303   ALT 21 10/08/2016 2303   ALKPHOS 70 10/08/2016 2303  BILITOT 0.2 (L) 10/08/2016 2303   GFRNONAA >60 05/31/2019 0757   GFRAA >60 05/31/2019 0757   Imaging I have reviewed the images obtained:  CT-scan of the brain--No acute findings, stroke or hemorrag  CTA of head and neck-negative for ELVO  Felicie MornDavid Smith PA-C [facility] [phone]  M-F  (9:00 am- 5:00 PM)  05/31/2019, 8:49 AM   ATTENDING EXAM Patient seen and examined as an acute code stroke Complains of right-sided weakness that started suddenly with last known normal at 7 AM. No headache no chest pain.  No visual changes.  No speech changes. Detailed exam as above.  Independently examined the patient. Independently reviewed imaging.  CT head with no acute findings.  CTA head and neck negative for any LVO.  Assessment:  This is a 58 year old male with past medical history of CAD who is noncompliant with his medications, brought into the hospital for acute onset right-sided weakness.  No prior history of stroke.  Symptoms are resolving but not completely resolved.  Code stroke was activated after a phone call  was placed from the ER to the neuro hospitalist.  Risks and benefits of IV TPA were discussed even with a lower NIH stroke scale since this involves his dominant right side-he agreed to proceed.   Most likely a small vessel etiology acute ischemic stroke versus TIA.   Impression: -Evaluate for acute ischemic stroke/TIA -Medication noncompliance -History of coronary artery disease  Plan: Acuity: Acute Current Suspected Etiology: Small vessel -Admit to: Neuro stepdown -Continue Statin -Hold Aspirin until 24 hour post tPA neuroimaging is stable and without evidence of bleeding -Blood pressure control, goal of SYS <180 -MRI/ECHO/A1C/Lipid panel. -PT/OT/ST therapies and recommendations when able  CNS -Close neuro monitoring -NPO until cleared by speech  Hemiplegia and hemiparesis following cerebral infarction affecting right dominant side -PT OT, PMR consult  RESP -No active issues at this time  CV -Aggressive BP control, goal SBP <180 -Use labetalol/hydralazine PRN or Cleviprex if needed. Will then need transfer to ICU if needs Cleviprex  Hyperlipidemia, unspecified  -Lipitor 80 mg daily home dose - Statin for goal LDL < 70  HEME -Monitor labs -transfuse for hgb < 7  ENDO -Will obtain A1c -goal HgbA1c < 7  GI/GU -No active issues -NS at 75/h  Fluid/Electrolyte Disorders No active issues -Repeat labs in AM and replete as needed  Prophylaxis DVT: SCD GI: Protonix Bowel: Senokot  Diet: NPO until cleared by speech  Code Status: Full Code     CRITICAL CARE ATTESTATION Performed by: Milon DikesAshish Ronit Cranfield, MD Total critical care time: 40 minutes Critical care time was exclusive of separately billable procedures and treating other patients and/or supervising APPs/Residents/Students Critical care was necessary to treat or prevent imminent or life-threatening deterioration due to acute ischemic stroke, IV thrombolytic administration This patient is critically ill and at  significant risk for neurological worsening and/or death and care requires constant monitoring. Critical care was time spent personally by me on the following activities: development of treatment plan with patient and/or surrogate as well as nursing, discussions with consultants, evaluation of patient's response to treatment, examination of patient, obtaining history from patient or surrogate, ordering and performing treatments and interventions, ordering and review of laboratory studies, ordering and review of radiographic studies, pulse oximetry, re-evaluation of patient's condition, participation in multidisciplinary rounds and medical decision making of high complexity in the care of this patient.  -- Milon DikesAshish Brezlyn Manrique, MD [facility] Pager: [phone] If 7pm to 7am, please call on call as  listed on AMION.

## 2019-05-31 NOTE — Progress Notes (Signed)
Pt belongings: shoes, clothes and wallet. Pt is keeping his wallet on his person so it's contents were not itemized.

## 2019-05-31 NOTE — Progress Notes (Signed)
Pharmacist Code Stroke Response  Notified to mix tPA at 0850 by Dr. Rory Percy Delivered tPA to RN at [phone]  tPA dose = 6.5mg  bolus over 1 minute followed by 58.9mg  for a total dose of 65.4mg  over 1 hour  Issues/delays encountered (if applicable): Code stroke not paged out until TPA already mixed. Pharmacy notified by ED provider to bring TPA to CT but not to mix. TPA decision made at 0850 per discussion with Neuro.   Elicia Lamp, PharmD, BCPS Please check AMION for all [place] contact numbers Clinical Pharmacist 05/31/2019 9:06 AM

## 2019-05-31 NOTE — ED Notes (Addendum)
Dr. Rory Percy notified by stroke nurse of patient increased right lip swelling. Patient did arrive with right lip swelling, however, swelling is increasing.

## 2019-05-31 NOTE — ED Triage Notes (Signed)
C/o pain with moving arm, "I just don't feel right, my right side doesn't feel right"

## 2019-06-01 ENCOUNTER — Inpatient Hospital Stay (HOSPITAL_COMMUNITY): Payer: Medicaid Other

## 2019-06-01 DIAGNOSIS — G459 Transient cerebral ischemic attack, unspecified: Principal | ICD-10-CM

## 2019-06-01 DIAGNOSIS — I999 Unspecified disorder of circulatory system: Secondary | ICD-10-CM

## 2019-06-01 DIAGNOSIS — I6389 Other cerebral infarction: Secondary | ICD-10-CM

## 2019-06-01 DIAGNOSIS — E7800 Pure hypercholesterolemia, unspecified: Secondary | ICD-10-CM

## 2019-06-01 DIAGNOSIS — F1410 Cocaine abuse, uncomplicated: Secondary | ICD-10-CM | POA: Diagnosis present

## 2019-06-01 DIAGNOSIS — R2990 Unspecified symptoms and signs involving the nervous system: Secondary | ICD-10-CM

## 2019-06-01 DIAGNOSIS — I10 Essential (primary) hypertension: Secondary | ICD-10-CM | POA: Diagnosis present

## 2019-06-01 DIAGNOSIS — Z720 Tobacco use: Secondary | ICD-10-CM

## 2019-06-01 DIAGNOSIS — F14988 Cocaine use, unspecified with other cocaine-induced disorder: Secondary | ICD-10-CM | POA: Diagnosis present

## 2019-06-01 LAB — LIPID PANEL
Cholesterol: 150 mg/dL (ref 0–200)
HDL: 45 mg/dL (ref >40)
LDL Cholesterol: 99 mg/dL (ref 0–99)
Total CHOL/HDL Ratio: 3.3 RATIO
Triglycerides: 32 mg/dL (ref <150)
VLDL: 6 mg/dL (ref 0–40)

## 2019-06-01 LAB — CBC
HCT: 42 % (ref 39.0–52.0)
Hemoglobin: 13.7 g/dL (ref 13.0–17.0)
MCH: 28.8 pg (ref 26.0–34.0)
MCHC: 32.6 g/dL (ref 30.0–36.0)
MCV: 88.2 fL (ref 80.0–100.0)
Platelets: 222 10*3/uL (ref 150–400)
RBC: 4.76 MIL/uL (ref 4.22–5.81)
RDW: 14.2 % (ref 11.5–15.5)
WBC: 7.1 10*3/uL (ref 4.0–10.5)
nRBC: 0 % (ref 0.0–0.2)

## 2019-06-01 LAB — BASIC METABOLIC PANEL
Anion gap: 7 (ref 5–15)
BUN: 14 mg/dL (ref 6–20)
CO2: 25 mmol/L (ref 22–32)
Calcium: 8.7 mg/dL — ABNORMAL LOW (ref 8.9–10.3)
Chloride: 106 mmol/L (ref 98–111)
Creatinine, Ser: 1.03 mg/dL (ref 0.61–1.24)
GFR calc Af Amer: >60 mL/min (ref >60)
GFR calc non Af Amer: >60 mL/min (ref >60)
Glucose, Bld: 129 mg/dL — ABNORMAL HIGH (ref 70–99)
Potassium: 3.7 mmol/L (ref 3.5–5.1)
Sodium: 138 mmol/L (ref 135–145)

## 2019-06-01 LAB — HEMOGLOBIN A1C
Hgb A1c MFr Bld: 5.4 % (ref 4.8–5.6)
Mean Plasma Glucose: 108.28 mg/dL

## 2019-06-01 LAB — MRSA PCR SCREENING: MRSA by PCR: NEGATIVE

## 2019-06-01 MED ORDER — ATORVASTATIN CALCIUM 40 MG PO TABS: 40.0000 mg | ORAL_TABLET | Freq: Every day | ORAL | 2 refills | Status: DC

## 2019-06-01 MED ORDER — CLOPIDOGREL BISULFATE 75 MG PO TABS
75.0000 mg | ORAL_TABLET | Freq: Every day | ORAL | Status: DC
  Administered 2019-06-01: 75 mg via ORAL
  Filled 2019-06-01: qty 1

## 2019-06-01 MED ORDER — ATORVASTATIN CALCIUM 40 MG PO TABS: 40.0000 mg | ORAL_TABLET | Freq: Every day | ORAL | Status: DC

## 2019-06-01 MED ORDER — CLOPIDOGREL BISULFATE 75 MG PO TABS: 75.0000 mg | ORAL_TABLET | Freq: Every day | ORAL | 0 refills | Status: DC

## 2019-06-01 MED ORDER — ASPIRIN EC 81 MG PO TBEC
81.0000 mg | DELAYED_RELEASE_TABLET | Freq: Every day | ORAL | Status: DC
  Administered 2019-06-01: 12:00:00 81 mg via ORAL
  Filled 2019-06-01: qty 1

## 2019-06-01 MED FILL — ATORVASTATIN CALCIUM 40 MG: 40 | 30 days supply | Qty: 30 | Fill #0

## 2019-06-01 MED FILL — CLOPIDOGREL 75 MG TABLET: 75 | 21 days supply | Qty: 21 | Fill #0

## 2019-06-01 NOTE — Progress Notes (Signed)
  Echocardiogram 2D Echocardiogram has been performed.  Mark Herrera 06/01/2019, 8:56 AM

## 2019-06-01 NOTE — TOC Transition Note (Signed)
Transition of Care [facility]) - CM/SW Discharge Note   Patient Details  Name: Mark Herrera MRN: [id number] Date of Birth: [birth year]  [facility] CM/SW Contact:  Ella Bodo, RN Phone Number: 06/01/2019, 2:21 PM   Clinical Narrative:   58 yo male admitted to ED on 12/28 for R sided weakness and pain, CTH reveals small vessel acute ischemic infarct vs TIA. PMH includes HTN, CAD, tobacco use.   PTA, pt independent; lives at home with significant other.  PT/OT recommending no OP follow up.  Pt uninsured, though states he used to have Medicaid.  Encouraged pt to check with his Medicaid Case Worker to see if this can be reinstated. Pt eligible for medication assistance through [facility] program; DC meds sent to [facility] pharmacy to be filled using [place] letter.  Pt agreeable to PCP follow up at a [facility] clinic, though states he does not like to go to doctors.  Follow up appointment scheduled at [facility], and appointment information put on AVS.     Final next level of care: Home/Self Care Barriers to Discharge: Barriers Resolved          Discharge Plan and Services   Discharge Planning Services: CM Consult, Medication Assistance, Follow-up appt scheduled, [facility], [facility] Program                                 Social Determinants of Health (SDOH) Interventions     Readmission Risk Interventions Readmission Risk Prevention Plan 06/01/2019  Post Dischage Appt Complete  Medication Screening Complete  Transportation Screening Complete  Some recent data might be hidden   Reinaldo Raddle, RN, BSN  Trauma/Neuro ICU Case Manager [phone]

## 2019-06-01 NOTE — Discharge Summary (Signed)
Stroke Discharge Summary  Patient ID: Mark Herrera   MRN: [id number]      DOB: March 11, 1961  Date of Admission: 05/31/2019 Date of Discharge: 06/01/2019  Attending Physician:  Marvel Plan, MD, Stroke MD Consultant(s):    None  Patient's PCP:  System, Pcp Not In  DISCHARGE DIAGNOSIS:  Principal Problem:   Stroke-like episode s/p tPA Active Problems:   Hyperlipemia   Tobacco abuse   Essential hypertension   Cocaine abuse (HCC)   LABORATORY STUDIES CBC    Component Value Date/Time   WBC 7.1 06/01/2019 0553   RBC 4.76 06/01/2019 0553   HGB 13.7 06/01/2019 0553   HCT 42.0 06/01/2019 0553   PLT 222 06/01/2019 0553   MCV 88.2 06/01/2019 0553   MCH 28.8 06/01/2019 0553   MCHC 32.6 06/01/2019 0553   RDW 14.2 06/01/2019 0553   LYMPHSABS 1.4 05/31/2019 1044   MONOABS 0.5 05/31/2019 1044   EOSABS 0.2 05/31/2019 1044   BASOSABS 0.0 05/31/2019 1044   CMP    Component Value Date/Time   NA 138 06/01/2019 0553   K 3.7 06/01/2019 0553   CL 106 06/01/2019 0553   CO2 25 06/01/2019 0553   GLUCOSE 129 (H) 06/01/2019 0553   BUN 14 06/01/2019 0553   CREATININE 1.03 06/01/2019 0553   CALCIUM 8.7 (L) 06/01/2019 0553   PROT 5.8 (L) 10/08/2016 2303   ALBUMIN 3.2 (L) 10/08/2016 2303   AST 30 10/08/2016 2303   ALT 21 10/08/2016 2303   ALKPHOS 70 10/08/2016 2303   BILITOT 0.2 (L) 10/08/2016 2303   GFRNONAA >60 06/01/2019 0553   GFRAA >60 06/01/2019 0553   COAGS Lab Results  Component Value Date   INR 1.1 05/31/2019   INR RESULTS UNAVAILABLE DUE TO INTERFERING SUBSTANCE 05/31/2019   INR 1.09 10/09/2016   Lipid Panel    Component Value Date/Time   CHOL 150 06/01/2019 0553   TRIG 32 06/01/2019 0553   HDL 45 06/01/2019 0553   CHOLHDL 3.3 06/01/2019 0553   VLDL 6 06/01/2019 0553   LDLCALC 99 06/01/2019 0553   HgbA1C  Lab Results  Component Value Date   HGBA1C 5.4 06/01/2019   Urinalysis    Component Value Date/Time   COLORURINE YELLOW 05/31/2019 1030    APPEARANCEUR CLEAR 05/31/2019 1030   LABSPEC >1.046 (H) 05/31/2019 1030   PHURINE 5.0 05/31/2019 1030   GLUCOSEU NEGATIVE 05/31/2019 1030   HGBUR MODERATE (A) 05/31/2019 1030   BILIRUBINUR NEGATIVE 05/31/2019 1030   KETONESUR NEGATIVE 05/31/2019 1030   PROTEINUR NEGATIVE 05/31/2019 1030   NITRITE NEGATIVE 05/31/2019 1030   LEUKOCYTESUR NEGATIVE 05/31/2019 1030   Urine Drug Screen     Component Value Date/Time   LABOPIA NONE DETECTED 05/31/2019 1035   COCAINSCRNUR POSITIVE (A) 05/31/2019 1035   LABBENZ NONE DETECTED 05/31/2019 1035   AMPHETMU NONE DETECTED 05/31/2019 1035   THCU POSITIVE (A) 05/31/2019 1035   LABBARB NONE DETECTED 05/31/2019 1035    Alcohol Level    Component Value Date/Time   ETH <10 05/31/2019 0835     SIGNIFICANT DIAGNOSTIC STUDIES CT ANGIO HEAD W OR WO CONTRAST  Result Date: 05/31/2019 CLINICAL DATA:  Abnormal sensations of the right side of the body. EXAM: CT ANGIOGRAPHY HEAD AND NECK TECHNIQUE: Multidetector CT imaging of the head and neck was performed using the standard protocol during bolus administration of intravenous contrast. Multiplanar CT image reconstructions and MIPs were obtained to evaluate the vascular anatomy. Carotid stenosis measurements (when applicable) are obtained  utilizing NASCET criteria, using the distal internal carotid diameter as the denominator. CONTRAST:  75mL OMNIPAQUE IOHEXOL 350 MG/ML SOLN COMPARISON:  Head CT same day FINDINGS: CTA NECK FINDINGS Aortic arch: Minimal aortic atherosclerotic calcification. Branching pattern is normal without origin stenosis. Right carotid system: Common carotid artery widely patent to the bifurcation. Carotid bifurcation is normal without soft or calcified plaque. No stenosis. Cervical ICA is normal. Left carotid system: Common carotid artery widely patent to the bifurcation. Bifurcation region shows minimal soft and calcified plaque but no stenosis. Vertebral arteries: Both vertebral artery origins  are widely patent. There is calcified plaque at the dominant left vertebral artery origin but no stenosis. Both vertebral arteries appear normal through the cervical region to the foramen magnum. Skeleton: Pronounced degenerative cervical spondylosis C3-4 through C5-6. Other neck: No mass or lymphadenopathy. Upper chest: Mild scarring and emphysematous change without evidence active process. Review of the MIP images confirms the above findings CTA HEAD FINDINGS Anterior circulation: Both internal carotid arteries are patent through the skull base and siphon regions. The anterior and middle cerebral vessels are patent without proximal stenosis, aneurysm or vascular malformation. No large or medium vessel occlusion identified. Posterior circulation: Both vertebral arteries are patent through the foramen magnum to the basilar. No basilar stenosis. Posterior circulation branch vessels are normal. Venous sinuses: Patent and normal. Anatomic variants: None significant. Review of the MIP images confirms the above findings IMPRESSION: No intracranial large or medium vessel occlusion or correctable proximal stenosis. No significant carotid bifurcation disease. No posterior circulation disease of significance. Mild nonstenotic calcified plaque at the left vertebral artery origin. Electronically Signed   By: Paulina Fusi M.D.   On: 05/31/2019 09:17   DG Chest 2 View  Result Date: 05/31/2019 CLINICAL DATA:  Chest pain and weakness EXAM: CHEST - 2 VIEW COMPARISON:  10/08/2016 FINDINGS: The heart size and mediastinal contours are within normal limits. Both lungs are clear. The visualized skeletal structures are unremarkable. IMPRESSION: No active cardiopulmonary disease. Electronically Signed   By: Alcide Clever M.D.   On: 05/31/2019 08:08   CT ANGIO NECK W OR WO CONTRAST  Result Date: 05/31/2019 CLINICAL DATA:  Abnormal sensations of the right side of the body. EXAM: CT ANGIOGRAPHY HEAD AND NECK TECHNIQUE: Multidetector  CT imaging of the head and neck was performed using the standard protocol during bolus administration of intravenous contrast. Multiplanar CT image reconstructions and MIPs were obtained to evaluate the vascular anatomy. Carotid stenosis measurements (when applicable) are obtained utilizing NASCET criteria, using the distal internal carotid diameter as the denominator. CONTRAST:  75mL OMNIPAQUE IOHEXOL 350 MG/ML SOLN COMPARISON:  Head CT same day FINDINGS: CTA NECK FINDINGS Aortic arch: Minimal aortic atherosclerotic calcification. Branching pattern is normal without origin stenosis. Right carotid system: Common carotid artery widely patent to the bifurcation. Carotid bifurcation is normal without soft or calcified plaque. No stenosis. Cervical ICA is normal. Left carotid system: Common carotid artery widely patent to the bifurcation. Bifurcation region shows minimal soft and calcified plaque but no stenosis. Vertebral arteries: Both vertebral artery origins are widely patent. There is calcified plaque at the dominant left vertebral artery origin but no stenosis. Both vertebral arteries appear normal through the cervical region to the foramen magnum. Skeleton: Pronounced degenerative cervical spondylosis C3-4 through C5-6. Other neck: No mass or lymphadenopathy. Upper chest: Mild scarring and emphysematous change without evidence active process. Review of the MIP images confirms the above findings CTA HEAD FINDINGS Anterior circulation: Both internal carotid arteries are patent  through the skull base and siphon regions. The anterior and middle cerebral vessels are patent without proximal stenosis, aneurysm or vascular malformation. No large or medium vessel occlusion identified. Posterior circulation: Both vertebral arteries are patent through the foramen magnum to the basilar. No basilar stenosis. Posterior circulation branch vessels are normal. Venous sinuses: Patent and normal. Anatomic variants: None  significant. Review of the MIP images confirms the above findings IMPRESSION: No intracranial large or medium vessel occlusion or correctable proximal stenosis. No significant carotid bifurcation disease. No posterior circulation disease of significance. Mild nonstenotic calcified plaque at the left vertebral artery origin. Electronically Signed   By: Paulina Fusi M.D.   On: 05/31/2019 09:17   MR BRAIN WO CONTRAST  Result Date: 06/01/2019 CLINICAL DATA:  Suspected stroke post tPA EXAM: MRI HEAD WITHOUT CONTRAST TECHNIQUE: Multiplanar, multiecho pulse sequences of the brain and surrounding structures were obtained without intravenous contrast. COMPARISON:  Correlation made with prior CT imaging FINDINGS: Brain: There is no acute infarction or intracranial hemorrhage. There is no intracranial mass, mass effect, or edema. There is no hydrocephalus or extra-axial fluid collection. Patchy foci of T2 hyperintensity in the supratentorial and pontine white matter are nonspecific but may reflect mild to moderate chronic microvascular ischemic changes in a patient of this age. Punctate focus of susceptibility hypointensity at the posterior aspect of the right lentiform nucleus is compatible with chronic microhemorrhage or less likely mineralization. Vascular: Major vessel flow voids at the skull base are preserved. Skull and upper cervical spine: Degenerative changes of the upper cervical spine. Calvarial marrow signal is unremarkable. Sinuses/Orbits: Minor mucosal thickening.  Orbits are unremarkable. Other: Sella is unremarkable.  Mastoid air cells are clear. IMPRESSION: No acute infarction, intracranial hemorrhage, or mass. Mild to moderate chronic microvascular ischemic changes. Electronically Signed   By: Guadlupe Spanish M.D.   On: 06/01/2019 10:41   CT HEAD CODE STROKE WO CONTRAST  Result Date: 05/31/2019 CLINICAL DATA:  Code stroke. Neurological deficit. Acute stroke suspected. Abnormal sensations on the left.  EXAM: CT HEAD WITHOUT CONTRAST TECHNIQUE: Contiguous axial images were obtained from the base of the skull through the vertex without intravenous contrast. COMPARISON:  None. FINDINGS: Brain: No evidence of brain atrophy. Mild chronic appearing small vessel change of the white matter. One could question few small foci of early low-density in the left posterior frontal region. This is not definite. No evidence of large vessel territory infarction. No mass, hemorrhage, hydrocephalus or extra-axial collection. Vascular: No abnormal vascular finding. Skull: Normal Sinuses/Orbits: Clear/normal Other: None ASPECTS ([place] Stroke Program Early CT Score) - Ganglionic level infarction (caudate, lentiform nuclei, internal capsule, insula, M1-M3 cortex): 7 - Supraganglionic infarction (M4-M6 cortex): 2 or 3 Total score (0-10 with 10 being normal): 9 or 10 IMPRESSION: 1. No definite acute finding by CT. Mild chronic small-vessel change of the hemispheric white matter. One could question a small focus of developing cortical low-density in the left posterior frontal region but this is definite. 2. ASPECTS is or 10 3. These results were communicated to Dr. Wilford Corner at 8:52 amon 12/28/2020by text page via the [facility] messaging system. Electronically Signed   By: Paulina Fusi M.D.   On: 05/31/2019 08:54      HISTORY OF PRESENT ILLNESS STETSON PELAEZ is a 58 y.o. male with past medical history of CAD, gout and tobacco abuse.  Patient is also noncompliant with his medications.  He states that he last known well at 7 AM 06/30/2018.  At that point he started  to have intercourse with his girlfriend when he suddenly noted that his right arm and right leg became paretic.  His girlfriend became very suspicious of stroke.  Initial evaluation not concerning on arrival but later continued weakness led to code stroke being called after the ED provider called the on-call neuro hospitalist who recommended a code stroke was called. Patient  denies any headaches or chest pain. On initial exam patient stated he had improved significantly which was very obvious as he was able to lift his arm and leg greater than what ED physician had noted previously.  NIHSS 3. mRS 0. Patient denied any headache prior to/during/after event. IV tPA was given and pt admitted to the neuro ICU.  HOSPITAL COURSE Left brain TIA s/p tPA likely small vessel disease in setting of cocaine use and smoking    Code Stroke CT head No acute abnormality. Mild small vessel disease. ASPECTS 10.     CTA head & neck Unremarkable   MRI  No acute abnormality. Mild to moderate small vessel disease.   2D Echo pending  LDL 99  HgbA1c 5.4  SCDs for VTE prophylaxis  aspirin 81 mg daily and Brilinta (ticagrelor) 90 mg bid ordered prior to admission but pt not taking, 24h post tPA started on aspirin 81 mg daily and clopidogrel 75 mg daily. Continue at d/c for 3 weeks and then ASA alone.  Therapy recommendations:  No therapy needs  Disposition:  Return home  Cocaine abuse  UDS positive for cocaine  Pt admitted cocaine use  Cessation education provided  Pt understood to quit  Tobacco abuse  Current smoker  Smoking cessation counseling provided  Pt is willing to quit  Hypertension  Home meds:  Lisinopril 2.5, coreg 3.125 bid  Stable . Long-term BP goal normotensive  Hyperlipidemia  Home meds:  lipitor 80, not taking  LDL 99, goal < 70  On lipitor 40  Continue statin at discharge  Other Stroke Risk Factors  ETOH use, alcohol level <10, advised to drink no more than 2 drink(s) a day  THC abuse - UDS THC POSITIVE Patient advised to stop using due to stroke risk.  Coronary artery disease s/p stent 2018  Other Active Problems    DISCHARGE EXAM  Temp:  [97.7 F (36.5 C)-99.6 F (37.6 C)] 98.7 F (37.1 C) (12/29 0800) Pulse Rate:  [55-80] 59 (12/29 0900) Resp:  [5-24] 15 (12/29 1000) BP: (104-143)/(63-95) 128/86 (12/29 1000) SpO2:   [96 %-100 %] 100 % (12/29 1000)  General - Well nourished, well developed, in no apparent distress.  Ophthalmologic - fundi not visualized due to noncooperation.  Cardiovascular - Regular rhythm and rate.  Mental Status -  Level of arousal and orientation to time, place, and person were intact. Language including expression, naming, repetition, comprehension was assessed and found intact. Fund of Knowledge was assessed and was intact.  Cranial Nerves II - XII - II - Visual field intact OU III, IV, VI - Extraocular movements intact. V - Facial sensation intact bilaterally. VII - Facial movement intact bilaterally. VIII - Hearing & vestibular intact bilaterally. X - Palate elevates symmetrically. XI - Chin turning & shoulder shrug intact bilaterally. XII - Tongue protrusion intact.  Motor Strength - The patient's strength was normal in all extremities and pronator drift was absent.  Bulk was normal and fasciculations were absent.   Motor Tone - Muscle tone was assessed at the neck and appendages and was normal.  Reflexes - The patient's reflexes were symmetrical  in all extremities and he had no pathological reflexes.  Sensory - Light touch, temperature/pinprick were assessed and were symmetrical.    Coordination - The patient had normal movements in the hands and feet with no ataxia or dysmetria.  Tremor was absent.  Gait and Station - normal gait.   Discharge Diet   Heart healthy/carb modified thin liquids  DISCHARGE PLAN  Disposition:  Return home  aspirin 81 mg daily and clopidogrel 75 mg daily for 3 weeks and then ASA alone for secondary stroke prevention  Ongoing stroke risk factor control by Primary Care Physician at time of discharge  Quit smoking and cocaine  Follow-up PCP in 2 weeks.  Follow-up in [facility] in 4 weeks, office to schedule an appointment.    35 minutes were spent preparing discharge.  Marvel PlanJindong Kamron Vanwyhe, MD  PhD Stroke Neurology 06/01/2019 11:42 AM

## 2019-06-01 NOTE — Evaluation (Signed)
Physical Therapy Evaluation - One time eval Patient Details Name: Mark Herrera MRN: [id number] DOB: [birth year] Today's Date: 06/01/2019   History of Present Illness  58 yo male admitted to ED on 12/28 for R sided weakness and pain, CTH reveals small vessel acute ischemic infarct vs TIA. PMH includes HTN, CAD, tobacco use.  Clinical Impression   Pt presents with [facility] strength and sensation, WFL balance in sitting and standing, and has good activity tolerance. PT appears to be at baseline level of functioning, states he is very active at home and likes to stay busy. PT educated pt on s/s of CVA/TIA so if symptoms arise in the future, pt knows what to look for. Pt agrees and states he understands. Pt with no further acute PT needs, pt to d/c home today.      Follow Up Recommendations No PT follow up    Equipment Recommendations  None recommended by PT    Recommendations for Other Services       Precautions / Restrictions Precautions Precautions: Fall Precaution Comments: Verbally reviewed "Be Fast" Stroke signs with pt Restrictions Weight Bearing Restrictions: No      Mobility  Bed Mobility Overal bed mobility: Needs Assistance Bed Mobility: Supine to Sit;Sit to Supine     Supine to sit: Supervision Sit to supine: Supervision   General bed mobility comments: supervision for safety, no impairment in bed mobility noted.  Transfers Overall transfer level: Needs assistance Equipment used: None Transfers: Sit to/from Stand Sit to Stand: Independent         General transfer comment: no physical assist or increased time needed.  Ambulation/Gait Ambulation/Gait assistance: Supervision Gait Distance (Feet): 220 Feet Assistive device: None Gait Pattern/deviations: Step-through pattern;WFL(Within Functional Limits) Gait velocity: WFL   General Gait Details: supervision for safety only; no LOB or unsteadiness noted in standing with challenges to gait (head turning  horizontally and vertically, change in gait speed)  Stairs            Wheelchair Mobility    Modified Rankin (Stroke Patients Only)       Balance Overall balance assessment: Independent                                           Pertinent Vitals/Pain Pain Assessment: No/denies pain    Home Living Family/patient expects to be discharged to:: Private residence Living Arrangements: Spouse/significant other Available Help at Discharge: Family Type of Home: Apartment Home Access: Level entry     Home Layout: One level Home Equipment: None      Prior Function Level of Independence: Independent         Comments: pt reports playing basketball recreationally     Hand Dominance   Dominant Hand: Right    Extremity/Trunk Assessment   Upper Extremity Assessment Upper Extremity Assessment: Overall WFL for tasks assessed    Lower Extremity Assessment Lower Extremity Assessment: Overall WFL for tasks assessed    Cervical / Trunk Assessment Cervical / Trunk Assessment: Normal  Communication   Communication: No difficulties  Cognition Arousal/Alertness: Awake/alert Behavior During Therapy: WFL for tasks assessed/performed Overall Cognitive Status: Within Functional Limits for tasks assessed                                        General  Comments      Exercises     Assessment/Plan    PT Assessment Patent does not need any further PT services  PT Problem List         PT Treatment Interventions      PT Goals (Current goals can be found in the Care Plan section)  Acute Rehab PT Goals Patient Stated Goal: go home PT Goal Formulation: With patient Time For Goal Achievement: 06/01/19 Potential to Achieve Goals: Good    Frequency     Barriers to discharge        Co-evaluation               AM-PAC PT "6 Clicks" Mobility  Outcome Measure Help needed turning from your back to your side while in a flat bed  without using bedrails?: None Help needed moving from lying on your back to sitting on the side of a flat bed without using bedrails?: None Help needed moving to and from a bed to a chair (including a wheelchair)?: None Help needed standing up from a chair using your arms (Herrera.g., wheelchair or bedside chair)?: None Help needed to walk in hospital room?: None Help needed climbing 3-5 steps with a railing? : None 6 Click Score: 24    End of Session Equipment Utilized During Treatment: Gait belt Activity Tolerance: Patient tolerated treatment well Patient left: in bed;with call bell/phone within reach;with family/visitor present Nurse Communication: Mobility status PT Visit Diagnosis: Other abnormalities of gait and mobility (R26.89);Other symptoms and signs involving the nervous system (R29.898)    Time: 3536-1443 PT Time Calculation (min) (ACUTE ONLY): 10 min   Charges:   PT Evaluation $PT Eval Low Complexity: 1 Low         Mark Herrera, PT Acute Rehabilitation Services Pager [phone]  Office [phone]   Mark Herrera 06/01/2019, 11:31 AM

## 2019-06-01 NOTE — Plan of Care (Signed)
Care plan completed. Trish Mage, RN BSN,

## 2019-06-01 NOTE — Progress Notes (Signed)
Discharge instructions and paperwork given to patient and significant other. Verbalized understanding. Prescriptions delivered from [facility] and sent home with patient. IV's removed.

## 2019-06-02 LAB — ECHOCARDIOGRAM COMPLETE
Height: 68.5 in
Weight: 2564.39 oz

## 2019-06-02 NOTE — Plan of Care (Signed)
Called by pharmacist that pt called in today saying that he had rash developed yesterday and overnight involving whole body. Pt stated that he did not take any medication we prescribed to him since discharge.   On chart review with pharmacist, he had ASA, plavix and lipitor one dose each during hospitalization. He had ASA before without allergic reaction. Interesting, he also had lip swelling after tPA and CT iodine contrast, not sure he had allergic reaction to tPA or contrast but he received benadryl, solumedrol and famotidine for that. Apparently, pt has high probability to have medication reactions.   Since he had rash at this time, it is not safe to continue the new medications. I discussed with pharmacy, will discontinue plavix and lipitor. Continue ASA 81 for now and he will need follow up with PCP and neurology to consider alternative meds or try again these meds one by one after rash resolved.   Rosalin Hawking, MD PhD Stroke Neurology 06/02/2019 9:33 AM

## 2019-06-24 ENCOUNTER — Encounter (INDEPENDENT_AMBULATORY_CARE_PROVIDER_SITE_OTHER): Payer: Self-pay | Admitting: Primary Care

## 2019-06-24 ENCOUNTER — Other Ambulatory Visit: Payer: Self-pay

## 2019-06-24 ENCOUNTER — Ambulatory Visit (INDEPENDENT_AMBULATORY_CARE_PROVIDER_SITE_OTHER): Payer: Self-pay | Admitting: Primary Care

## 2019-06-24 DIAGNOSIS — F1910 Other psychoactive substance abuse, uncomplicated: Secondary | ICD-10-CM

## 2019-06-24 DIAGNOSIS — Z7689 Persons encountering health services in other specified circumstances: Secondary | ICD-10-CM

## 2019-06-24 DIAGNOSIS — Z09 Encounter for follow-up examination after completed treatment for conditions other than malignant neoplasm: Secondary | ICD-10-CM

## 2019-06-24 DIAGNOSIS — R2990 Unspecified symptoms and signs involving the nervous system: Secondary | ICD-10-CM

## 2019-07-01 ENCOUNTER — Telehealth (INDEPENDENT_AMBULATORY_CARE_PROVIDER_SITE_OTHER): Payer: Self-pay | Admitting: Licensed Clinical Social Worker

## 2019-07-01 NOTE — Progress Notes (Signed)
Virtual Visit via Telephone Note  I connected with Mark Herrera on 07/01/19 at  2:30 PM EST by telephone and verified that I am speaking with the correct person using two identifiers.   I discussed the limitations, risks, security and privacy concerns of performing an evaluation and management service by telephone and the availability of in person appointments. I also discussed with the patient that there may be a patient responsible charge related to this service. The patient expressed understanding and agreed to proceed.   History of Present Illness: [name] is having a tele visit status post TIA emergency room arrival to admission. He is presently to follow up with [facility] in 4 weeks. Establishing care with new PCP.   Past Medical History:  Diagnosis Date  . CAD (coronary artery disease)    10/08/16 STEMI DES x1 to pLAD EF 45%  . Gout   . Tobacco abuse    Current Outpatient Medications on File Prior to Visit  Medication Sig Dispense Refill  . aspirin EC 81 MG EC tablet Take 1 tablet (81 mg total) by mouth daily.    Marland Kitchen atorvastatin (LIPITOR) 40 MG tablet Take 1 tablet (40 mg total) by mouth daily at 6 PM. (Patient not taking: Reported on 06/24/2019) 30 tablet 2  . clopidogrel (PLAVIX) 75 MG tablet Take 1 tablet (75 mg total) by mouth daily. (Patient not taking: Reported on 06/24/2019) 21 tablet 0   No current facility-administered medications on file prior to visit.   Observations/Objective:  Review of Systems  Neurological: Positive for weakness.       Little slur in speech but able to understand   Psychiatric/Behavioral: Positive for substance abuse.  All other systems reviewed and are negative.   Assessment and Plan: Coen was seen today for hospitalization follow-up.  Diagnoses and all orders for this visit:  Encounter to establish care Mark Passe, NP-C will be your  (PCP) mastered prepared that is able to that  will  diagnosed and treatment able to answer health concern as well as continuing care of varied medical conditions, not limited by cause, organ system, or diagnosis.   Hospital discharge follow-up Recommended Follow-up PCP in 2 weeks and neurologist in 4 weeks. Increase risk for sub sequential Herrera alcohol , THC , cocaine and tobacco use . Refer follow up with CSW for interventions and other coping skills  Herrera-like episode s/p tPA Discharge from hospital on 81 mg daily and clopidogrel 75 mg daily for 3 weeks and then ASA alone for secondary Herrera prevention. Follow-up in [facility] in 4 weeks, office to schedule an appointment  Substance abuse (HCC) Quit smoking and cocaine. Made aware of increased risk for lung cancer and other respiratory diseases and cocaine can be directly related to cardiac arrest recommend cessation.  This will be reminded at each clinical visit.    Follow Up Instructions:    I discussed the assessment and treatment plan with the patient. The patient was provided an opportunity to ask questions and all were answered. The patient agreed with the plan and demonstrated an understanding of the instructions.   The patient was advised to call back or seek an in-person evaluation if the symptoms worsen or if the condition fails to improve as anticipated.  I provided 28  minutes of non-face-to-face time during this encounter. Includes reviewing records, labs and imaging   Grayce Sessions, NP

## 2019-07-01 NOTE — Telephone Encounter (Signed)
Call placed to patient regarding [facility] referral. LCSW left message requesting a return call.  

## 2019-07-05 ENCOUNTER — Inpatient Hospital Stay: Payer: MEDICAID | Admitting: Adult Health

## 2019-07-05 NOTE — Progress Notes (Deleted)
[facility] [address] [place] [place] [address] [phone]       HOSPITAL FOLLOW UP NOTE  Mr. BERL BONFANTI Date of Birth:  11/22/1960 Medical Record Number:  [id number]   Reason for Referral:  hospital stroke follow up    CHIEF COMPLAINT:  No chief complaint on file.   HPI: CASMERE HOLLENBECK being seen today for in office hospital follow-up regarding left brain TIA status post TPA secondary to small vessel disease in setting of cocaine and tobacco use.Marland Kitchen  History obtained from *** and chart review. Reviewed all radiology images and labs personally.  Tashan Kreitzer Frazieris a 59 y.o.malewith past medical history of CAD, gout and tobacco abuse as well as reported noncompliancewith prescribed medications.  He presented on 06/30/2018 with right-sided weakness occurring during intercourse.  He states that he last known well at 7 AM 06/30/2018.  Initial evaluation not concerning but later continued weakness led to code stroke and after CT head unremarkable received TPA with NIHSS 3 without complication.  MRI unremarkable and was felt symptoms were likely related to left brain TIA status post TPA likely secondary to small vessel disease in setting of cocaine use and smoking.  2D echo showed EF of 40 to 45% and left ventricle with global hypokinesis.  CTA head/neck unremarkable.  UDS positive for cocaine and THC. No evidence or history of DM with A1c 5.4.  Previously prescribed aspirin 81 mg daily and Brilinta 90 mg twice daily but patient noncompliant and recommended initiating aspirin 81 mg daily and clopidogrel for 3 weeks and aspirin alone.  Current cocaine abuse with cessation education provided as well as tobacco cessation education provided for current tobacco use.  HTN stable and recommended continuation of home dose lisinopril and Coreg.  LDL 99 previously prescribed atorvastatin 80 mg daily but noncompliant and recommended initiation of atorvastatin 40 mg daily.   Other stroke risk factors include EtOH use with recommendation of no more than 2 drinks per day, THC use with cessation education provided and CAD status post stent 2018.  No prior history of stroke.  Prior to discharge, he developed a rash the day prior worsening overnight involving whole body.  Unable to determine etiology of allergic reaction possibly due to TPA versus contrast or initiation of new medications.  Due to unknown etiology, it is recommended to discontinue Plavix and atorvastatin and to continue on aspirin 81 mg daily as he had taken this previously without allergic reaction.  Advised to follow-up outpatient with PCP and neurology to consider alternate medications to retry discontinue medications one by one once rash resolved.  He was discharged home in stable condition without therapy needs.      ROS:   14 system review of systems performed and negative with exception of ***  PMH:  Past Medical History:  Diagnosis Date  . CAD (coronary artery disease)    10/08/16 STEMI DES x1 to pLAD EF 45%  . Gout   . Tobacco abuse     PSH:  Past Surgical History:  Procedure Laterality Date  . CORONARY STENT INTERVENTION N/A 10/08/2016   Procedure: Coronary Stent Intervention;  Surgeon: Kathleene Hazel, MD;  Location: [facility] INVASIVE CV LAB;  Service: Cardiovascular;  Laterality: N/A;  . LEFT HEART CATH AND CORONARY ANGIOGRAPHY N/A 10/08/2016   Procedure: Left Heart Cath and Coronary Angiography;  Surgeon: Kathleene Hazel, MD;  Location: [facility] INVASIVE CV LAB;  Service: Cardiovascular;  Laterality: N/A;  . TOE SURGERY  Social History:  Social History   Socioeconomic History  . Marital status: Single    Spouse name: Not on file  . Number of children: Not on file  . Years of education: Not on file  . Highest education level: Not on file  Occupational History  . Not on file  Tobacco Use  . Smoking status: Current Every Day Smoker    Packs/day: 1.00    Types: Cigarettes    . Smokeless tobacco: Never Used  Substance and Sexual Activity  . Alcohol use: Yes    Comment: occ  . Drug use: Yes    Types: Marijuana  . Sexual activity: Not on file  Other Topics Concern  . Not on file  Social History Narrative  . Not on file   Social Determinants of Health   Financial Resource Strain:   . Difficulty of Paying Living Expenses: Not on file  Food Insecurity:   . Worried About Programme researcher, broadcasting/film/video in the Last Year: Not on file  . Ran Out of Food in the Last Year: Not on file  Transportation Needs:   . Lack of Transportation (Medical): Not on file  . Lack of Transportation (Non-Medical): Not on file  Physical Activity:   . Days of Exercise per Week: Not on file  . Minutes of Exercise per Session: Not on file  Stress:   . Feeling of Stress : Not on file  Social Connections:   . Frequency of Communication with Friends and Family: Not on file  . Frequency of Social Gatherings with Friends and Family: Not on file  . Attends Religious Services: Not on file  . Active Member of Clubs or Organizations: Not on file  . Attends Banker Meetings: Not on file  . Marital Status: Not on file  Intimate Partner Violence:   . Fear of Current or Ex-Partner: Not on file  . Emotionally Abused: Not on file  . Physically Abused: Not on file  . Sexually Abused: Not on file    Family History:  Family History  Problem Relation Age of Onset  . Hypertension Father     Medications:   Current Outpatient Medications on File Prior to Visit  Medication Sig Dispense Refill  . aspirin EC 81 MG EC tablet Take 1 tablet (81 mg total) by mouth daily.    Marland Kitchen atorvastatin (LIPITOR) 40 MG tablet Take 1 tablet (40 mg total) by mouth daily at 6 PM. (Patient not taking: Reported on 06/24/2019) 30 tablet 2  . clopidogrel (PLAVIX) 75 MG tablet Take 1 tablet (75 mg total) by mouth daily. (Patient not taking: Reported on 06/24/2019) 21 tablet 0   No current facility-administered  medications on file prior to visit.    Allergies:   Allergies  Allergen Reactions  . Atorvastatin     Patient developed rash after discharge while taking Lipitor + Plavix  . Clopidogrel     Patient developed rash after discharge while taking Lipitor + Plavix     Physical Exam  There were no vitals filed for this visit. There is no height or weight on file to calculate BMI. No exam data present  Depression screen [facility] 2/9 06/24/2019  Decreased Interest 0  Down, Depressed, Hopeless 0  PHQ - 2 Score 0     General: well developed, well nourished, seated, in no evident distress Head: head normocephalic and atraumatic.   Neck: supple with no carotid or supraclavicular bruits Cardiovascular: regular rate and rhythm, no murmurs Musculoskeletal:  no deformity Skin:  no rash/petichiae Vascular:  Normal pulses all extremities   Neurologic Exam Mental Status: Awake and fully alert. Oriented to place and time. Recent and remote memory intact. Attention span, concentration and fund of knowledge appropriate. Mood and affect appropriate.  Cranial Nerves: Fundoscopic exam reveals sharp disc margins. Pupils equal, briskly reactive to light. Extraocular movements full without nystagmus. Visual fields full to confrontation. Hearing intact. Facial sensation intact. Face, tongue, palate moves normally and symmetrically.  Motor: Normal bulk and tone. Normal strength in all tested extremity muscles. Sensory.: intact to touch , pinprick , position and vibratory sensation.  Coordination: Rapid alternating movements normal in all extremities. Finger-to-nose and heel-to-shin performed accurately bilaterally. Gait and Station: Arises from chair without difficulty. Stance is normal. Gait demonstrates normal stride length and balance Reflexes: 1+ and symmetric. Toes downgoing.     NIHSS  *** Modified Rankin  ***    Diagnostic Data (Labs, Imaging, Testing)  CT HEAD WO  CONTRAST 05/31/2019 IMPRESSION: 1. No definite acute finding by CT. Mild chronic small-vessel change of the hemispheric white matter. One could question a small focus of developing cortical low-density in the left posterior frontal region but this is definite.  CT ANGIO HEAD W OR WO CONTRAST CT ANGIO NECK W OR WO CONTRAST 05/31/2019 IMPRESSION: No intracranial large or medium vessel occlusion or correctable proximal stenosis. No significant carotid bifurcation disease. No posterior circulation disease of significance. Mild nonstenotic calcified plaque at the left vertebral artery origin.  MR BRAIN WO CONTRAST 06/01/2019 IMPRESSION: No acute infarction, intracranial hemorrhage, or mass. Mild to moderate chronic microvascular ischemic changes.  ECHOCARDIOGRAM 06/01/2019 IMPRESSIONS 1. Left ventricular ejection fraction, by visual estimation, is 40 to  45%. The left ventricle has normal function. There is no left ventricular  hypertrophy.  2. Left ventricular diastolic parameters are consistent with Grade II  diastolic dysfunction (pseudonormalization).  3. Mildly dilated left ventricular internal cavity size.  4. The left ventricle demonstrates global hypokinesis.  5. Global right ventricle has normal systolic function.The right  ventricular size is normal. No increase in right ventricular wall  thickness.  6. Left atrial size was normal.  7. Right atrial size was normal.  8. The mitral valve is normal in structure. Trivial mitral valve  regurgitation. No evidence of mitral stenosis.  9. The tricuspid valve is normal in structure.  10. The aortic valve is tricuspid. Aortic valve regurgitation is trivial.  No evidence of aortic valve sclerosis or stenosis.  11. The pulmonic valve was normal in structure. Pulmonic valve  regurgitation is not visualized.  12. Normal pulmonary artery systolic pressure.  13. The inferior vena cava is normal in size with greater than  50%  respiratory variability, suggesting right atrial pressure of 3 mmHg.    ASSESSMENT: DELVONTE BERENSON is a 59 y.o. year old male presented with right-sided weakness on 05/31/2019 with symptoms likely due to left brain TIA s/p TPA secondary to small vessel disease in setting of cocaine and tobacco use. Vascular risk factors include medication noncompliance, HTN, HLD, CAD s/p stent 2018, cocaine use, THC use, tobacco use and alcohol abuse.     PLAN:  1. TIA: Continue {anticoagulants:31417}  and ***  for secondary stroke prevention. Maintain strict control of hypertension with blood pressure goal below 130/90, diabetes with hemoglobin A1c goal below 6.5% and cholesterol with LDL cholesterol (bad cholesterol) goal below 70 mg/dL.  I also advised the patient to eat a healthy diet with plenty of whole grains, cereals,  fruits and vegetables, exercise regularly with at least 30 minutes of continuous activity daily and maintain ideal body weight. 2. HTN: Advised to continue current treatment regimen.  Today's BP ***.  Advised to continue to monitor at home along with continued follow-up with PCP for management 3. HLD: Advised to continue current treatment regimen along with continued follow-up with PCP for future prescribing and monitoring of lipid panel 4. Polysubstance abuse:     Follow up in *** or call earlier if needed   Greater than 50% of time during this 45 minute visit was spent on counseling, explanation of diagnosis of TIA, reviewing risk factor management of HTN, HLD, polysubstance abuse, planning of further management along with potential future management, and discussion with patient and family answering all questions.    Frann Rider, AGNP-BC  [facility] [address] [place], [place] [address]  Phone [phone] Fax [phone] Note: This document was prepared with digital dictation and possible smart phrase technology. Any  transcriptional errors that result from this process are unintentional.

## 2019-07-12 ENCOUNTER — Encounter: Payer: Self-pay | Admitting: Adult Health

## 2020-05-31 ENCOUNTER — Emergency Department (HOSPITAL_COMMUNITY): Payer: Self-pay

## 2020-05-31 ENCOUNTER — Emergency Department (HOSPITAL_COMMUNITY): Payer: Self-pay | Admitting: Certified Registered Nurse Anesthetist

## 2020-05-31 ENCOUNTER — Inpatient Hospital Stay (HOSPITAL_COMMUNITY)
Admission: EM | Admit: 2020-05-31 | Discharge: 2020-06-01 | DRG: 023 | Disposition: A | Discharge status: Home / Self Care | Payer: Self-pay | Attending: Neurology | Admitting: Neurology

## 2020-05-31 ENCOUNTER — Other Ambulatory Visit: Payer: Self-pay

## 2020-05-31 ENCOUNTER — Encounter (HOSPITAL_COMMUNITY): Payer: Self-pay | Admitting: Neurology

## 2020-05-31 ENCOUNTER — Inpatient Hospital Stay (HOSPITAL_COMMUNITY): Payer: Self-pay

## 2020-05-31 ENCOUNTER — Encounter (HOSPITAL_COMMUNITY)
Admission: EM | Disposition: A | Discharge status: Home / Self Care | Payer: Self-pay | Source: Home / Self Care | Attending: Neurology

## 2020-05-31 DIAGNOSIS — G8194 Hemiplegia, unspecified affecting left nondominant side: Secondary | ICD-10-CM | POA: Diagnosis present

## 2020-05-31 DIAGNOSIS — Z9114 Patient's other noncompliance with medication regimen: Secondary | ICD-10-CM

## 2020-05-31 DIAGNOSIS — Z20822 Contact with and (suspected) exposure to covid-19: Secondary | ICD-10-CM | POA: Diagnosis present

## 2020-05-31 DIAGNOSIS — I252 Old myocardial infarction: Secondary | ICD-10-CM

## 2020-05-31 DIAGNOSIS — Z8673 Personal history of transient ischemic attack (TIA), and cerebral infarction without residual deficits: Secondary | ICD-10-CM

## 2020-05-31 DIAGNOSIS — Z888 Allergy status to other drugs, medicaments and biological substances status: Secondary | ICD-10-CM

## 2020-05-31 DIAGNOSIS — R29810 Facial weakness: Secondary | ICD-10-CM | POA: Diagnosis present

## 2020-05-31 DIAGNOSIS — R29710 NIHSS score 10: Secondary | ICD-10-CM | POA: Diagnosis present

## 2020-05-31 DIAGNOSIS — R4781 Slurred speech: Secondary | ICD-10-CM | POA: Diagnosis present

## 2020-05-31 DIAGNOSIS — R471 Dysarthria and anarthria: Secondary | ICD-10-CM | POA: Diagnosis present

## 2020-05-31 DIAGNOSIS — J9601 Acute respiratory failure with hypoxia: Secondary | ICD-10-CM | POA: Diagnosis not present

## 2020-05-31 DIAGNOSIS — F17210 Nicotine dependence, cigarettes, uncomplicated: Secondary | ICD-10-CM | POA: Diagnosis present

## 2020-05-31 DIAGNOSIS — I63511 Cerebral infarction due to unspecified occlusion or stenosis of right middle cerebral artery: Principal | ICD-10-CM | POA: Diagnosis present

## 2020-05-31 DIAGNOSIS — E785 Hyperlipidemia, unspecified: Secondary | ICD-10-CM | POA: Diagnosis present

## 2020-05-31 DIAGNOSIS — Z7982 Long term (current) use of aspirin: Secondary | ICD-10-CM

## 2020-05-31 DIAGNOSIS — Z8249 Family history of ischemic heart disease and other diseases of the circulatory system: Secondary | ICD-10-CM

## 2020-05-31 DIAGNOSIS — Z9911 Dependence on respirator [ventilator] status: Secondary | ICD-10-CM

## 2020-05-31 DIAGNOSIS — Z955 Presence of coronary angioplasty implant and graft: Secondary | ICD-10-CM

## 2020-05-31 DIAGNOSIS — I10 Essential (primary) hypertension: Secondary | ICD-10-CM | POA: Diagnosis present

## 2020-05-31 DIAGNOSIS — I63311 Cerebral infarction due to thrombosis of right middle cerebral artery: Secondary | ICD-10-CM

## 2020-05-31 DIAGNOSIS — F1210 Cannabis abuse, uncomplicated: Secondary | ICD-10-CM | POA: Diagnosis present

## 2020-05-31 DIAGNOSIS — F1410 Cocaine abuse, uncomplicated: Secondary | ICD-10-CM | POA: Diagnosis present

## 2020-05-31 DIAGNOSIS — I639 Cerebral infarction, unspecified: Secondary | ICD-10-CM | POA: Diagnosis present

## 2020-05-31 DIAGNOSIS — I427 Cardiomyopathy due to drug and external agent: Secondary | ICD-10-CM

## 2020-05-31 DIAGNOSIS — I2510 Atherosclerotic heart disease of native coronary artery without angina pectoris: Secondary | ICD-10-CM | POA: Diagnosis present

## 2020-05-31 HISTORY — PX: IR US GUIDE VASC ACCESS RIGHT: IMG2390

## 2020-05-31 HISTORY — PX: RADIOLOGY WITH ANESTHESIA: SHX6223

## 2020-05-31 HISTORY — PX: IR CT HEAD LTD: IMG2386

## 2020-05-31 HISTORY — PX: IR PERCUTANEOUS ART THROMBECTOMY/INFUSION INTRACRANIAL INC DIAG ANGIO: IMG6087

## 2020-05-31 HISTORY — DX: Cocaine abuse, uncomplicated: F14.10

## 2020-05-31 HISTORY — DX: Cerebral infarction, unspecified: I63.9

## 2020-05-31 LAB — URINALYSIS, COMPLETE (UACMP) WITH MICROSCOPIC
Bacteria, UA: NONE SEEN
Bilirubin Urine: NEGATIVE
Glucose, UA: NEGATIVE mg/dL
Ketones, ur: NEGATIVE mg/dL
Leukocytes,Ua: NEGATIVE
Nitrite: NEGATIVE
Protein, ur: NEGATIVE mg/dL
Specific Gravity, Urine: 1.006 (ref 1.005–1.030)
pH: 7 (ref 5.0–8.0)

## 2020-05-31 LAB — CBC
HCT: 44.4 % (ref 39.0–52.0)
Hemoglobin: 14.1 g/dL (ref 13.0–17.0)
MCH: 28.9 pg (ref 26.0–34.0)
MCHC: 31.8 g/dL (ref 30.0–36.0)
MCV: 91 fL (ref 80.0–100.0)
Platelets: 208 10*3/uL (ref 150–400)
RBC: 4.88 MIL/uL (ref 4.22–5.81)
RDW: 13.3 % (ref 11.5–15.5)
WBC: 6 10*3/uL (ref 4.0–10.5)
nRBC: 0 % (ref 0.0–0.2)

## 2020-05-31 LAB — COMPREHENSIVE METABOLIC PANEL
ALT: 13 U/L (ref 0–44)
AST: 19 U/L (ref 15–41)
Albumin: 3.6 g/dL (ref 3.5–5.0)
Alkaline Phosphatase: 80 U/L (ref 38–126)
Anion gap: 9 (ref 5–15)
BUN: <5 mg/dL — ABNORMAL LOW (ref 6–20)
CO2: 26 mmol/L (ref 22–32)
Calcium: 9.3 mg/dL (ref 8.9–10.3)
Chloride: 104 mmol/L (ref 98–111)
Creatinine, Ser: 0.96 mg/dL (ref 0.61–1.24)
GFR, Estimated: >60 mL/min (ref >60)
Glucose, Bld: 109 mg/dL — ABNORMAL HIGH (ref 70–99)
Potassium: 4.1 mmol/L (ref 3.5–5.1)
Sodium: 139 mmol/L (ref 135–145)
Total Bilirubin: 0.6 mg/dL (ref 0.3–1.2)
Total Protein: 7.1 g/dL (ref 6.5–8.1)

## 2020-05-31 LAB — RESP PANEL BY RT-PCR (FLU A&B, COVID) ARPGX2
Influenza A by PCR: NEGATIVE
Influenza B by PCR: NEGATIVE
SARS Coronavirus 2 by RT PCR: NEGATIVE

## 2020-05-31 LAB — PROTIME-INR
INR: 1.1 (ref 0.8–1.2)
Prothrombin Time: 13.4 seconds (ref 11.4–15.2)

## 2020-05-31 LAB — RAPID URINE DRUG SCREEN, HOSP PERFORMED
Amphetamines: NOT DETECTED
Barbiturates: NOT DETECTED
Benzodiazepines: NOT DETECTED
Cocaine: POSITIVE — AB
Opiates: NOT DETECTED
Tetrahydrocannabinol: POSITIVE — AB

## 2020-05-31 LAB — DIFFERENTIAL
Abs Immature Granulocytes: 0.02 10*3/uL (ref 0.00–0.07)
Basophils Absolute: 0 10*3/uL (ref 0.0–0.1)
Basophils Relative: 0 %
Eosinophils Absolute: 0.2 10*3/uL (ref 0.0–0.5)
Eosinophils Relative: 4 %
Immature Granulocytes: 0 %
Lymphocytes Relative: 14 %
Lymphs Abs: 0.8 10*3/uL (ref 0.7–4.0)
Monocytes Absolute: 0.3 10*3/uL (ref 0.1–1.0)
Monocytes Relative: 5 %
Neutro Abs: 4.6 10*3/uL (ref 1.7–7.7)
Neutrophils Relative %: 77 %

## 2020-05-31 LAB — I-STAT CHEM 8, ED
BUN: 7 mg/dL (ref 6–20)
Calcium, Ion: 1.14 mmol/L — ABNORMAL LOW (ref 1.15–1.40)
Chloride: 102 mmol/L (ref 98–111)
Creatinine, Ser: 0.9 mg/dL (ref 0.61–1.24)
Glucose, Bld: 103 mg/dL — ABNORMAL HIGH (ref 70–99)
HCT: 45 % (ref 39.0–52.0)
Hemoglobin: 15.3 g/dL (ref 13.0–17.0)
Potassium: 4.1 mmol/L (ref 3.5–5.1)
Sodium: 140 mmol/L (ref 135–145)
TCO2: 26 mmol/L (ref 22–32)

## 2020-05-31 LAB — CBG MONITORING, ED: Glucose-Capillary: 104 mg/dL — ABNORMAL HIGH (ref 70–99)

## 2020-05-31 LAB — GLUCOSE, CAPILLARY: Glucose-Capillary: 124 mg/dL — ABNORMAL HIGH (ref 70–99)

## 2020-05-31 LAB — APTT: aPTT: 28 seconds (ref 24–36)

## 2020-05-31 LAB — HIV ANTIBODY (ROUTINE TESTING W REFLEX): HIV Screen 4th Generation wRfx: NONREACTIVE

## 2020-05-31 SURGERY — IR WITH ANESTHESIA
Anesthesia: General

## 2020-05-31 MED ORDER — SUCCINYLCHOLINE CHLORIDE 200 MG/10ML IV SOSY
PREFILLED_SYRINGE | INTRAVENOUS | Status: DC | PRN
  Administered 2020-05-31: 100 mg via INTRAVENOUS

## 2020-05-31 MED ORDER — PROPOFOL 500 MG/50ML IV EMUL
INTRAVENOUS | Status: DC | PRN
  Administered 2020-05-31: 50 ug/kg/min via INTRAVENOUS

## 2020-05-31 MED ORDER — CLEVIDIPINE BUTYRATE 0.5 MG/ML IV EMUL
0.0000 mg/h | INTRAVENOUS | Status: DC
  Administered 2020-05-31: 17:00:00 10 mg/h via INTRAVENOUS
  Administered 2020-05-31: 22:00:00 12 mg/h via INTRAVENOUS
  Administered 2020-05-31: 16:00:00 1 mg/h via INTRAVENOUS
  Administered 2020-05-31: 19:00:00 12 mg/h via INTRAVENOUS
  Administered 2020-06-01: 03:00:00 17 mg/h via INTRAVENOUS
  Filled 2020-05-31 (×2): qty 50
  Filled 2020-05-31: qty 100
  Filled 2020-05-31: qty 50

## 2020-05-31 MED ORDER — NITROGLYCERIN 1 MG/10 ML FOR IR/CATH LAB
INTRA_ARTERIAL | Status: AC
  Filled 2020-05-31: qty 10

## 2020-05-31 MED ORDER — CEFAZOLIN SODIUM-DEXTROSE 2-4 GM/100ML-% IV SOLN
INTRAVENOUS | Status: AC
  Filled 2020-05-31: qty 100

## 2020-05-31 MED ORDER — FENTANYL CITRATE (PF) 100 MCG/2ML IJ SOLN
INTRAMUSCULAR | Status: AC
  Filled 2020-05-31: qty 2

## 2020-05-31 MED ORDER — SENNOSIDES-DOCUSATE SODIUM 8.6-50 MG PO TABS: 1.0000 | ORAL_TABLET | Freq: Every evening | ORAL | Status: DC | PRN

## 2020-05-31 MED ORDER — PHENYLEPHRINE 40 MCG/ML (10ML) SYRINGE FOR IV PUSH (FOR BLOOD PRESSURE SUPPORT)
PREFILLED_SYRINGE | INTRAVENOUS | Status: DC | PRN
  Administered 2020-05-31: 80 ug via INTRAVENOUS
  Administered 2020-05-31: 40 ug via INTRAVENOUS
  Administered 2020-05-31: 80 ug via INTRAVENOUS

## 2020-05-31 MED ORDER — ROCURONIUM BROMIDE 10 MG/ML (PF) SYRINGE
PREFILLED_SYRINGE | INTRAVENOUS | Status: DC | PRN
  Administered 2020-05-31: 70 mg via INTRAVENOUS

## 2020-05-31 MED ORDER — STROKE: EARLY STAGES OF RECOVERY BOOK
Freq: Once | Status: AC
  Filled 2020-05-31: qty 1

## 2020-05-31 MED ORDER — LACTATED RINGERS IV SOLN: INTRAVENOUS | Status: DC | PRN

## 2020-05-31 MED ORDER — ACETAMINOPHEN 325 MG PO TABS: 650.0000 mg | ORAL_TABLET | ORAL | Status: DC | PRN

## 2020-05-31 MED ORDER — LIDOCAINE 2% (20 MG/ML) 5 ML SYRINGE
INTRAMUSCULAR | Status: DC | PRN
  Administered 2020-05-31: 40 mg via INTRAVENOUS

## 2020-05-31 MED ORDER — FENTANYL CITRATE (PF) 250 MCG/5ML IJ SOLN
INTRAMUSCULAR | Status: DC | PRN
  Administered 2020-05-31: 100 ug via INTRAVENOUS

## 2020-05-31 MED ORDER — IOHEXOL 300 MG/ML  SOLN
50.0000 mL | Freq: Once | INTRAMUSCULAR | Status: AC | PRN
  Administered 2020-05-31: 15:00:00 5 mL via INTRA_ARTERIAL

## 2020-05-31 MED ORDER — IOHEXOL 350 MG/ML SOLN
100.0000 mL | Freq: Once | INTRAVENOUS | Status: AC | PRN
  Administered 2020-05-31: 100 mL via INTRAVENOUS

## 2020-05-31 MED ORDER — ACETAMINOPHEN 650 MG RE SUPP: 650.0000 mg | RECTAL | Status: DC | PRN

## 2020-05-31 MED ORDER — GLYCOPYRROLATE PF 0.2 MG/ML IJ SOSY
PREFILLED_SYRINGE | INTRAMUSCULAR | Status: DC | PRN
  Administered 2020-05-31: .2 mg via INTRAVENOUS

## 2020-05-31 MED ORDER — IOHEXOL 300 MG/ML  SOLN
150.0000 mL | Freq: Once | INTRAMUSCULAR | Status: AC | PRN
  Administered 2020-05-31: 15:00:00 30 mL via INTRA_ARTERIAL

## 2020-05-31 MED ORDER — CHLORHEXIDINE GLUCONATE CLOTH 2 % EX PADS: 6.0000 | MEDICATED_PAD | Freq: Every day | CUTANEOUS | Status: DC

## 2020-05-31 MED ORDER — SODIUM CHLORIDE 0.9% FLUSH: 3.0000 mL | Freq: Once | INTRAVENOUS | Status: AC

## 2020-05-31 MED ORDER — PROPOFOL 10 MG/ML IV BOLUS
INTRAVENOUS | Status: DC | PRN
  Administered 2020-05-31: 30 mg via INTRAVENOUS
  Administered 2020-05-31: 150 mg via INTRAVENOUS
  Administered 2020-05-31: 100 mg via INTRAVENOUS
  Administered 2020-05-31: 20 mg via INTRAVENOUS

## 2020-05-31 MED ORDER — PHENYLEPHRINE HCL-NACL 10-0.9 MG/250ML-% IV SOLN
INTRAVENOUS | Status: DC | PRN
  Administered 2020-05-31: 40 ug/min via INTRAVENOUS

## 2020-05-31 MED ORDER — SODIUM CHLORIDE 0.9 % IV SOLN: INTRAVENOUS | Status: DC

## 2020-05-31 MED ORDER — ACETAMINOPHEN 160 MG/5ML PO SOLN: 650.0000 mg | ORAL | Status: DC | PRN

## 2020-05-31 MED ORDER — EPHEDRINE SULFATE-NACL 50-0.9 MG/10ML-% IV SOSY
PREFILLED_SYRINGE | INTRAVENOUS | Status: DC | PRN
  Administered 2020-05-31 (×2): 2.5 mg via INTRAVENOUS

## 2020-05-31 NOTE — Procedures (Signed)
Extubation Procedure Note  Patient Details:   Name: EBEN CHOINSKI DOB: Apr 27, 1961 MRN: [id number]   Airway Documentation:    Vent end date: 05/31/20 Vent end time: 1615   Evaluation  O2 sats: stable throughout Complications: No apparent complications Patient did tolerate procedure well. Bilateral Breath Sounds: Clear,Diminished   Patient extubated per MD order & placed on 4L [place]. Patient does not follow commands or speak, but is breathing comfortably in no distress.  Jacqulynn Cadet 05/31/2020, 4:21 PM

## 2020-05-31 NOTE — Procedures (Signed)
Neuro-Interventional Radiology  Post Cerebral Angiogram Procedure Note  Operator:    Dr. Loreta Ave Assistant:   None  History:   59 yo male presents to the ED with acute left sided weakness and facial droop.  Onset reported 10:45am.     Baseline mRS:  0 NIHSS:   10 Site of occlusion:  Right mca/m1   Procedure: US guided right CFA access Cervical & Cerebral Angiogram Mechanical Thrombectomy Deployment of Celt device for hemostasis Flat Panel CT in NIR  First Pass Device:  Direct aspiration, Zoom 77  Result   TICI 2a  Second Pass Device: Combination stent-retriever 4x40 solitaire, with intermediate catheter aspiration, Zoom 77  Result   TICI 2c  Findings:   Initial TICI 0 through the right MCA. Dense clot, which impeded the microcatheter into the angular branch. After first pass the frontal branch was open, <50% territory perfusion. After second pass the entire MCA and branches were open.  Slow distal/cortical filling, with TICI 2c final.   Anesthesia:   GETA  EBL:    60cc     Complication:  None   Medication: IV tPA administered?: no IA Medication:  no  Recommendations: - right hip straight until extubation.  The Celt device used for hemostasis has indication for immediate ambulation, when applicable.  - Goal SBP 120-140.   - Frequent NV checks - Repeat CT or MRI imaging recommended within 36 hours, discretion of Neurology - NIR to follow - COVID test is pending at time of case completion - Disposition 4N 24  Signed,  Yvone Neu. Loreta Ave, DO

## 2020-05-31 NOTE — Transfer of Care (Signed)
Immediate Anesthesia Transfer of Care Note  Patient: Mark Herrera  Procedure(s) Performed: IR WITH ANESTHESIA (N/A )  Patient Location: ICU  Anesthesia Type:General  Level of Consciousness: sedated and Patient remains intubated per anesthesia plan  Airway & Oxygen Therapy: Patient Spontanous Breathing, Patient remains intubated per anesthesia plan and Patient placed on Ventilator (see vital sign flow sheet for setting)  Post-op Assessment: Report given to RN and Post -op Vital signs reviewed and stable  Post vital signs: Reviewed and stable  Last Vitals:  Vitals Value Taken Time  BP 137/76 05/31/20 1528  Temp    Pulse 70 05/31/20 1536  Resp 18 05/31/20 1536  SpO2 100 % 05/31/20 1536  Vitals shown include unvalidated device data.  Last Pain:  Vitals:   05/31/20 1330  TempSrc: Oral         Complications: No complications documented.

## 2020-05-31 NOTE — Progress Notes (Signed)
NeuroInterventional Radiology  Pre-Procedure Note  History: 59 yo male presents to the ED with acute left sided weakness and facial droop.  Onset reported 10:45am.    Baseline mRS: 0 NIHSS:    10  CT ASPECTS: 8 CTA:   Right MCA/M1 occlusion CTP:   CBF = 27cc, Tmax (at risk) = 117   I have discussed the case with Dr. Otelia Limes of Stroke Neurology.  Given the patient's symptoms, imaging findings, baseline function, we agree they are an appropriate candidate for attempt for mechanical thrombectomy.    The risks and benefits of the procedure were discussed with the patient, who was awake and lucid enough to understand his situation.  Specific risks of thrombectomy discussed include: bleeding, infection, arterial injury/dissection, contrast reaction, kidney injury, need for further procedure/surgery, neurologic deficit, 10-15% risk of intracranial hemorrhage, cardiopulmonary collapse, death. The risks of doing nothing but medical care were also addressed, which includes the risk of large right MCA territory infarction/stroke, including the entire 117cc at risk region, resulting in permanent significant left sided symptoms.   All questions were answered.  The patent would like to proceed with attempt at thrombectomy.    Plan for cerebral angiogram and attempt at mechanical thrombectomy.   Signed,   Yvone Neu. Loreta Ave, DO

## 2020-05-31 NOTE — ED Triage Notes (Signed)
Pt arrived from home by EMS, friend noticed left side weakness and facial drop around 1045.  Hx of CVA 38yr ago, pt had angioedema after receiving TPA  Code Stroke called by EMS

## 2020-05-31 NOTE — ED Provider Notes (Signed)
[facility] EMERGENCY DEPARTMENT Provider Note   CSN: [id number] Arrival date & time: 05/31/20  1302     History Chief complaint: code stroke  Mark Herrera is a 59 y.o. male.  Patient presents as code stroke with left sided weakness and slurred speech. Symptoms acute onset approximately 2 hrs pta, symptoms mod-severe, constant/persistent, pt noted to have facial droop, slurred speech and left weakness.  No headache. EMS indicates CBG 134. No report of trauma or fall.  Last reported as 'normal' or at baseline ~ 1045.  The history is provided by the patient and the EMS personnel.       Past Medical History:  Diagnosis Date  . CAD (coronary artery disease)    10/08/16 STEMI DES x1 to pLAD EF 45%  . Gout   . Tobacco abuse     Patient Active Problem List   Diagnosis Date Noted  . Essential hypertension 06/01/2019  . Cocaine abuse (HCC) 06/01/2019  . Stroke-like episode s/p tPA 05/31/2019  . Hyperlipemia 10/11/2016  . Tobacco abuse 10/11/2016  . STEMI (ST elevation myocardial infarction) (HCC) 10/09/2016  . Acute ST elevation myocardial infarction (STEMI) involving left anterior descending (LAD) coronary artery [facility]     Past Surgical History:  Procedure Laterality Date  . CORONARY STENT INTERVENTION N/A 10/08/2016   Procedure: Coronary Stent Intervention;  Surgeon: Kathleene Hazel, MD;  Location: [facility] INVASIVE CV LAB;  Service: Cardiovascular;  Laterality: N/A;  . LEFT HEART CATH AND CORONARY ANGIOGRAPHY N/A 10/08/2016   Procedure: Left Heart Cath and Coronary Angiography;  Surgeon: Kathleene Hazel, MD;  Location: [facility] INVASIVE CV LAB;  Service: Cardiovascular;  Laterality: N/A;  . TOE SURGERY         Family History  Problem Relation Age of Onset  . Hypertension Father     Social History   Tobacco Use  . Smoking status: Current Every Day Smoker    Packs/day: 1.00    Types: Cigarettes  . Smokeless tobacco: Never Used  Substance Use  Topics  . Alcohol use: Yes    Comment: occ  . Drug use: Yes    Types: Marijuana    Home Medications Prior to Admission medications   Medication Sig Start Date End Date Taking? Authorizing Provider  aspirin EC 81 MG EC tablet Take 1 tablet (81 mg total) by mouth daily. 10/12/16   Arty Baumgartner, NP  atorvastatin (LIPITOR) 40 MG tablet Take 1 tablet (40 mg total) by mouth daily at 6 PM. Patient not taking: Reported on 06/24/2019 06/01/19   Layne Benton, NP  clopidogrel (PLAVIX) 75 MG tablet Take 1 tablet (75 mg total) by mouth daily. Patient not taking: Reported on 06/24/2019 06/02/19   Layne Benton, NP    Allergies    Atorvastatin and Clopidogrel  Review of Systems   Review of Systems  Constitutional: Negative for fever.  HENT: Negative for congestion.   Eyes: Negative for redness.  Respiratory: Negative for cough and shortness of breath.   Cardiovascular: Negative for chest pain.  Gastrointestinal: Negative for vomiting.  Genitourinary: Negative for flank pain.  Musculoskeletal: Negative for neck pain.  Skin: Negative for rash.  Neurological: Positive for speech difficulty and weakness. Negative for headaches.  Hematological: Does not bruise/bleed easily.  Psychiatric/Behavioral: Negative for agitation.    Physical Exam Updated Vital Signs BP 134/95 (EMS) hr 70 rr 16.  Physical Exam Vitals and nursing note reviewed.  Constitutional:      Appearance: Normal appearance.  He is well-developed.  HENT:     Head: Atraumatic.     Nose: Nose normal.     Mouth/Throat:     Mouth: Mucous membranes are moist.  Eyes:     General: No scleral icterus.    Conjunctiva/sclera: Conjunctivae normal.     Pupils: Pupils are equal, round, and reactive to light.  Neck:     Vascular: No carotid bruit.     Trachea: No tracheal deviation.  Cardiovascular:     Rate and Rhythm: Normal rate and regular rhythm.     Pulses: Normal pulses.     Heart sounds: Normal heart sounds.   Pulmonary:     Effort: Pulmonary effort is normal. No accessory muscle usage or respiratory distress.     Breath sounds: Normal breath sounds.  Abdominal:     General: There is no distension.     Tenderness: There is no abdominal tenderness.  Genitourinary:    Comments: No cva tenderness. Musculoskeletal:        General: No swelling.     Cervical back: Normal range of motion and neck supple.  Skin:    General: Skin is warm and dry.     Findings: No rash.  Neurological:     Mental Status: He is alert.     Comments: Alert. Left weakness. Slurred speech.   Psychiatric:        Mood and Affect: Mood normal.     ED Results / Procedures / Treatments   Labs (all labs ordered are listed, but only abnormal results are displayed) Results for orders placed or performed during the hospital encounter of 05/31/20  Protime-INR  Result Value Ref Range   Prothrombin Time 13.4 11.4 - 15.2 seconds   INR 1.1 0.8 - 1.2  APTT  Result Value Ref Range   aPTT 28 24 - 36 seconds  CBC  Result Value Ref Range   WBC 6.0 4.0 - 10.5 K/uL   RBC 4.88 4.22 - 5.81 MIL/uL   Hemoglobin 14.1 13.0 - 17.0 g/dL   HCT 16.1 09.6 - 04.5 %   MCV 91.0 80.0 - 100.0 fL   MCH 28.9 26.0 - 34.0 pg   MCHC 31.8 30.0 - 36.0 g/dL   RDW 40.9 81.1 - 91.4 %   Platelets 208 150 - 400 K/uL   nRBC 0.0 0.0 - 0.2 %  Differential  Result Value Ref Range   Neutrophils Relative % 77 %   Neutro Abs 4.6 1.7 - 7.7 K/uL   Lymphocytes Relative 14 %   Lymphs Abs 0.8 0.7 - 4.0 K/uL   Monocytes Relative 5 %   Monocytes Absolute 0.3 0.1 - 1.0 K/uL   Eosinophils Relative 4 %   Eosinophils Absolute 0.2 0.0 - 0.5 K/uL   Basophils Relative 0 %   Basophils Absolute 0.0 0.0 - 0.1 K/uL   Immature Granulocytes 0 %   Abs Immature Granulocytes 0.02 0.00 - 0.07 K/uL  CBG monitoring, ED  Result Value Ref Range   Glucose-Capillary 104 (H) 70 - 99 mg/dL  I-stat chem 8, ED  Result Value Ref Range   Sodium 140 135 - 145 mmol/L   Potassium  4.1 3.5 - 5.1 mmol/L   Chloride 102 98 - 111 mmol/L   BUN 7 6 - 20 mg/dL   Creatinine, Ser 7.82 0.61 - 1.24 mg/dL   Glucose, Bld 956 (H) 70 - 99 mg/dL   Calcium, Ion 2.13 (L) 1.15 - 1.40 mmol/L   TCO2 26 22 -  32 mmol/L   Hemoglobin 15.3 13.0 - 17.0 g/dL   HCT 57.3 22.0 - 25.4 %   CT Code Stroke CTA Head W/WO contrast  Result Date: 05/31/2020 CLINICAL DATA:  Left-sided weakness, slurred speech EXAM: CT ANGIOGRAPHY HEAD AND NECK CT PERFUSION BRAIN TECHNIQUE: Multidetector CT imaging of the head and neck was performed using the standard protocol during bolus administration of intravenous contrast. Multiplanar CT image reconstructions and MIPs were obtained to evaluate the vascular anatomy. Carotid stenosis measurements (when applicable) are obtained utilizing NASCET criteria, using the distal internal carotid diameter as the denominator. Multiphase CT imaging of the brain was performed following IV bolus contrast injection. Subsequent parametric perfusion maps were calculated using RAPID software. CONTRAST:  OMNIPAQUE IOHEXOL 350 MG/ML SOLN COMPARISON:  05/31/2019 FINDINGS: CTA NECK FINDINGS Aortic arch: Great vessel origins are patent. Right carotid system: Patent.  No measurable stenosis. Left carotid system: Patent. Trace mixed plaque at the ICA origin. No measurable stenosis. Vertebral arteries: Patent. Left vertebral artery is dominant with mild plaque at the origin. No measurable stenosis. Skeleton: Advanced degenerative changes of the cervical spine primarily from C3-C4 to C5-C6. Other neck: No mass or adenopathy. Upper chest: No apical lung mass. Review of the MIP images confirms the above findings CTA HEAD FINDINGS Anterior circulation: Intracranial internal carotid arteries are patent with minimal calcified plaque. Right M1 MCA is patent to the bifurcation where there is occlusion. Partial reconstitution of M2 MCA and more distal branches. Left middle cerebral and bilateral anterior  cerebral arteries are patent. Anterior communicating artery is present. Posterior circulation: Intracranial vertebral arteries, basilar artery, and posterior cerebral arteries are patent. Venous sinuses: As permitted by contrast timing, patent. Review of the MIP images confirms the above findings CT Brain Perfusion Findings: CBF (<30%) Volume: 69mL Perfusion (Tmax>6.0s) volume: Mismatch Volume: 68mL Infarction Location: Right MCA territory IMPRESSION: Right M1 MCA bifurcation occlusion with partial reconstitution of M2 MCA and more distal branches. Perfusion imaging demonstrates 27 mL of core infarction and 90 mL of penumbra within the right MCA territory. No hemodynamically significant stenosis in the neck These results were communicated to Dr. Otelia Limes at 1:39 pm on 05/31/2020 by text page via the [facility] messaging system. Electronically Signed   By: Guadlupe Spanish M.D.   On: 05/31/2020 13:47   CT Code Stroke CTA Neck W/WO contrast  Result Date: 05/31/2020 CLINICAL DATA:  Left-sided weakness, slurred speech EXAM: CT ANGIOGRAPHY HEAD AND NECK CT PERFUSION BRAIN TECHNIQUE: Multidetector CT imaging of the head and neck was performed using the standard protocol during bolus administration of intravenous contrast. Multiplanar CT image reconstructions and MIPs were obtained to evaluate the vascular anatomy. Carotid stenosis measurements (when applicable) are obtained utilizing NASCET criteria, using the distal internal carotid diameter as the denominator. Multiphase CT imaging of the brain was performed following IV bolus contrast injection. Subsequent parametric perfusion maps were calculated using RAPID software. CONTRAST:  OMNIPAQUE IOHEXOL 350 MG/ML SOLN COMPARISON:  05/31/2019 FINDINGS: CTA NECK FINDINGS Aortic arch: Great vessel origins are patent. Right carotid system: Patent.  No measurable stenosis. Left carotid system: Patent. Trace mixed plaque at the ICA origin. No measurable stenosis.  Vertebral arteries: Patent. Left vertebral artery is dominant with mild plaque at the origin. No measurable stenosis. Skeleton: Advanced degenerative changes of the cervical spine primarily from C3-C4 to C5-C6. Other neck: No mass or adenopathy. Upper chest: No apical lung mass. Review of the MIP images confirms the above findings CTA HEAD FINDINGS Anterior circulation: Intracranial  internal carotid arteries are patent with minimal calcified plaque. Right M1 MCA is patent to the bifurcation where there is occlusion. Partial reconstitution of M2 MCA and more distal branches. Left middle cerebral and bilateral anterior cerebral arteries are patent. Anterior communicating artery is present. Posterior circulation: Intracranial vertebral arteries, basilar artery, and posterior cerebral arteries are patent. Venous sinuses: As permitted by contrast timing, patent. Review of the MIP images confirms the above findings CT Brain Perfusion Findings: CBF (<30%) Volume: 63mL Perfusion (Tmax>6.0s) volume: Mismatch Volume: 1mL Infarction Location: Right MCA territory IMPRESSION: Right M1 MCA bifurcation occlusion with partial reconstitution of M2 MCA and more distal branches. Perfusion imaging demonstrates 27 mL of core infarction and 90 mL of penumbra within the right MCA territory. No hemodynamically significant stenosis in the neck These results were communicated to Dr. Otelia Limes at 1:39 pm on 05/31/2020 by text page via the [facility] messaging system. Electronically Signed   By: Guadlupe Spanish M.D.   On: 05/31/2020 13:47   CT Code Stroke Cerebral Perfusion with contrast  Result Date: 05/31/2020 CLINICAL DATA:  Left-sided weakness, slurred speech EXAM: CT ANGIOGRAPHY HEAD AND NECK CT PERFUSION BRAIN TECHNIQUE: Multidetector CT imaging of the head and neck was performed using the standard protocol during bolus administration of intravenous contrast. Multiplanar CT image reconstructions and MIPs were obtained to evaluate the  vascular anatomy. Carotid stenosis measurements (when applicable) are obtained utilizing NASCET criteria, using the distal internal carotid diameter as the denominator. Multiphase CT imaging of the brain was performed following IV bolus contrast injection. Subsequent parametric perfusion maps were calculated using RAPID software. CONTRAST:  OMNIPAQUE IOHEXOL 350 MG/ML SOLN COMPARISON:  05/31/2019 FINDINGS: CTA NECK FINDINGS Aortic arch: Great vessel origins are patent. Right carotid system: Patent.  No measurable stenosis. Left carotid system: Patent. Trace mixed plaque at the ICA origin. No measurable stenosis. Vertebral arteries: Patent. Left vertebral artery is dominant with mild plaque at the origin. No measurable stenosis. Skeleton: Advanced degenerative changes of the cervical spine primarily from C3-C4 to C5-C6. Other neck: No mass or adenopathy. Upper chest: No apical lung mass. Review of the MIP images confirms the above findings CTA HEAD FINDINGS Anterior circulation: Intracranial internal carotid arteries are patent with minimal calcified plaque. Right M1 MCA is patent to the bifurcation where there is occlusion. Partial reconstitution of M2 MCA and more distal branches. Left middle cerebral and bilateral anterior cerebral arteries are patent. Anterior communicating artery is present. Posterior circulation: Intracranial vertebral arteries, basilar artery, and posterior cerebral arteries are patent. Venous sinuses: As permitted by contrast timing, patent. Review of the MIP images confirms the above findings CT Brain Perfusion Findings: CBF (<30%) Volume: 59mL Perfusion (Tmax>6.0s) volume: Mismatch Volume: 75mL Infarction Location: Right MCA territory IMPRESSION: Right M1 MCA bifurcation occlusion with partial reconstitution of M2 MCA and more distal branches. Perfusion imaging demonstrates 27 mL of core infarction and 90 mL of penumbra within the right MCA territory. No hemodynamically  significant stenosis in the neck These results were communicated to Dr. Otelia Limes at 1:39 pm on 05/31/2020 by text page via the [facility] messaging system. Electronically Signed   By: Guadlupe Spanish M.D.   On: 05/31/2020 13:47   CT HEAD CODE STROKE WO CONTRAST  Result Date: 05/31/2020 CLINICAL DATA:  Code stroke.  Left-sided weakness, slurred speech EXAM: CT HEAD WITHOUT CONTRAST TECHNIQUE: Contiguous axial images were obtained from the base of the skull through the vertex without intravenous contrast. COMPARISON:  05/31/2019 FINDINGS: Brain: There is no  acute intracranial hemorrhage. New loss of gray-white differentiation in the right MCA territory along the right insula. There is also suspected involvement the inferior parietal cortex. There is no mass effect. Additional patchy hypoattenuation in the supratentorial white matter is nonspecific but probably reflects similar chronic microvascular ischemic changes. Ventricles and sulci are within normal limits in size and configuration. Vascular: Hyperdensity of a proximal right M2 MCA branch. There is intracranial atherosclerotic calcification at the skull base. Skull: Unremarkable. Sinuses/Orbits: Minor mucosal thickening. Other: Mastoid air cells are clear. ASPECTS [facility] Stroke Program Early CT Score) - Ganglionic level infarction (caudate, lentiform nuclei, internal capsule, insula, M1-M3 cortex): 5 - Supraganglionic infarction (M4-M6 cortex): 3 Total score (0-10 with 10 being normal): 8 IMPRESSION: No acute intracranial hemorrhage. Acute infarction right MCA territory with hyperdense proximal right M2 MCA. ASPECT score is 8. These results were communicated to Dr. Otelia Limes at 1:18 pmon 05/31/2020 by text page via the [facility] messaging system. Electronically Signed   By: Guadlupe Spanish M.D.   On: 05/31/2020 13:30    EKG None  Radiology CT Code Stroke CTA Head W/WO contrast  Result Date: 05/31/2020 CLINICAL DATA:  Left-sided weakness, slurred speech EXAM:  CT ANGIOGRAPHY HEAD AND NECK CT PERFUSION BRAIN TECHNIQUE: Multidetector CT imaging of the head and neck was performed using the standard protocol during bolus administration of intravenous contrast. Multiplanar CT image reconstructions and MIPs were obtained to evaluate the vascular anatomy. Carotid stenosis measurements (when applicable) are obtained utilizing NASCET criteria, using the distal internal carotid diameter as the denominator. Multiphase CT imaging of the brain was performed following IV bolus contrast injection. Subsequent parametric perfusion maps were calculated using RAPID software. CONTRAST:  OMNIPAQUE IOHEXOL 350 MG/ML SOLN COMPARISON:  05/31/2019 FINDINGS: CTA NECK FINDINGS Aortic arch: Great vessel origins are patent. Right carotid system: Patent.  No measurable stenosis. Left carotid system: Patent. Trace mixed plaque at the ICA origin. No measurable stenosis. Vertebral arteries: Patent. Left vertebral artery is dominant with mild plaque at the origin. No measurable stenosis. Skeleton: Advanced degenerative changes of the cervical spine primarily from C3-C4 to C5-C6. Other neck: No mass or adenopathy. Upper chest: No apical lung mass. Review of the MIP images confirms the above findings CTA HEAD FINDINGS Anterior circulation: Intracranial internal carotid arteries are patent with minimal calcified plaque. Right M1 MCA is patent to the bifurcation where there is occlusion. Partial reconstitution of M2 MCA and more distal branches. Left middle cerebral and bilateral anterior cerebral arteries are patent. Anterior communicating artery is present. Posterior circulation: Intracranial vertebral arteries, basilar artery, and posterior cerebral arteries are patent. Venous sinuses: As permitted by contrast timing, patent. Review of the MIP images confirms the above findings CT Brain Perfusion Findings: CBF (<30%) Volume: 77mL Perfusion (Tmax>6.0s) volume: Mismatch Volume: 8mL Infarction  Location: Right MCA territory IMPRESSION: Right M1 MCA bifurcation occlusion with partial reconstitution of M2 MCA and more distal branches. Perfusion imaging demonstrates 27 mL of core infarction and 90 mL of penumbra within the right MCA territory. No hemodynamically significant stenosis in the neck These results were communicated to Dr. Otelia Limes at 1:39 pm on 05/31/2020 by text page via the [facility] messaging system. Electronically Signed   By: Guadlupe Spanish M.D.   On: 05/31/2020 13:47   CT Code Stroke CTA Neck W/WO contrast  Result Date: 05/31/2020 CLINICAL DATA:  Left-sided weakness, slurred speech EXAM: CT ANGIOGRAPHY HEAD AND NECK CT PERFUSION BRAIN TECHNIQUE: Multidetector CT imaging of the head and neck was performed using  the standard protocol during bolus administration of intravenous contrast. Multiplanar CT image reconstructions and MIPs were obtained to evaluate the vascular anatomy. Carotid stenosis measurements (when applicable) are obtained utilizing NASCET criteria, using the distal internal carotid diameter as the denominator. Multiphase CT imaging of the brain was performed following IV bolus contrast injection. Subsequent parametric perfusion maps were calculated using RAPID software. CONTRAST:  100mL OMNIPAQUE IOHEXOL 350 MG/ML SOLN COMPARISON:  05/31/2019 FINDINGS: CTA NECK FINDINGS Aortic arch: Great vessel origins are patent. Right carotid system: Patent.  No measurable stenosis. Left carotid system: Patent. Trace mixed plaque at the ICA origin. No measurable stenosis. Vertebral arteries: Patent. Left vertebral artery is dominant with mild plaque at the origin. No measurable stenosis. Skeleton: Advanced degenerative changes of the cervical spine primarily from C3-C4 to C5-C6. Other neck: No mass or adenopathy. Upper chest: No apical lung mass. Review of the MIP images confirms the above findings CTA HEAD FINDINGS Anterior circulation: Intracranial internal carotid arteries are patent with  minimal calcified plaque. Right M1 MCA is patent to the bifurcation where there is occlusion. Partial reconstitution of M2 MCA and more distal branches. Left middle cerebral and bilateral anterior cerebral arteries are patent. Anterior communicating artery is present. Posterior circulation: Intracranial vertebral arteries, basilar artery, and posterior cerebral arteries are patent. Venous sinuses: As permitted by contrast timing, patent. Review of the MIP images confirms the above findings CT Brain Perfusion Findings: CBF (<30%) Volume: 27mL Perfusion (Tmax>6.0s) volume: 117mL Mismatch Volume: 90mL Infarction Location: Right MCA territory IMPRESSION: Right M1 MCA bifurcation occlusion with partial reconstitution of M2 MCA and more distal branches. Perfusion imaging demonstrates 27 mL of core infarction and 90 mL of penumbra within the right MCA territory. No hemodynamically significant stenosis in the neck These results were communicated to Dr. Otelia LimesLindzen at 1:39 pm on 05/31/2020 by text page via the [facility] messaging system. Electronically Signed   By: Guadlupe SpanishPraneil  Patel M.D.   On: 05/31/2020 13:47   CT Code Stroke Cerebral Perfusion with contrast  Result Date: 05/31/2020 CLINICAL DATA:  Left-sided weakness, slurred speech EXAM: CT ANGIOGRAPHY HEAD AND NECK CT PERFUSION BRAIN TECHNIQUE: Multidetector CT imaging of the head and neck was performed using the standard protocol during bolus administration of intravenous contrast. Multiplanar CT image reconstructions and MIPs were obtained to evaluate the vascular anatomy. Carotid stenosis measurements (when applicable) are obtained utilizing NASCET criteria, using the distal internal carotid diameter as the denominator. Multiphase CT imaging of the brain was performed following IV bolus contrast injection. Subsequent parametric perfusion maps were calculated using RAPID software. CONTRAST:  100mL OMNIPAQUE IOHEXOL 350 MG/ML SOLN COMPARISON:  05/31/2019 FINDINGS: CTA NECK  FINDINGS Aortic arch: Great vessel origins are patent. Right carotid system: Patent.  No measurable stenosis. Left carotid system: Patent. Trace mixed plaque at the ICA origin. No measurable stenosis. Vertebral arteries: Patent. Left vertebral artery is dominant with mild plaque at the origin. No measurable stenosis. Skeleton: Advanced degenerative changes of the cervical spine primarily from C3-C4 to C5-C6. Other neck: No mass or adenopathy. Upper chest: No apical lung mass. Review of the MIP images confirms the above findings CTA HEAD FINDINGS Anterior circulation: Intracranial internal carotid arteries are patent with minimal calcified plaque. Right M1 MCA is patent to the bifurcation where there is occlusion. Partial reconstitution of M2 MCA and more distal branches. Left middle cerebral and bilateral anterior cerebral arteries are patent. Anterior communicating artery is present. Posterior circulation: Intracranial vertebral arteries, basilar artery, and posterior cerebral arteries are patent. Venous  sinuses: As permitted by contrast timing, patent. Review of the MIP images confirms the above findings CT Brain Perfusion Findings: CBF (<30%) Volume: 27mL Perfusion (Tmax>6.0s) volume: Mismatch Volume: 90mL Infarction Location: Right MCA territory IMPRESSION: Right M1 MCA bifurcation occlusion with partial reconstitution of M2 MCA and more distal branches. Perfusion imaging demonstrates 27 mL of core infarction and 90 mL of penumbra within the right MCA territory. No hemodynamically significant stenosis in the neck These results were communicated to Dr. Otelia Limes at 1:39 pm on 05/31/2020 by text page via the [facility] messaging system. Electronically Signed   By: Guadlupe Spanish M.D.   On: 05/31/2020 13:47   CT HEAD CODE STROKE WO CONTRAST  Result Date: 05/31/2020 CLINICAL DATA:  Code stroke.  Left-sided weakness, slurred speech EXAM: CT HEAD WITHOUT CONTRAST TECHNIQUE: Contiguous axial images were obtained  from the base of the skull through the vertex without intravenous contrast. COMPARISON:  05/31/2019 FINDINGS: Brain: There is no acute intracranial hemorrhage. New loss of gray-white differentiation in the right MCA territory along the right insula. There is also suspected involvement the inferior parietal cortex. There is no mass effect. Additional patchy hypoattenuation in the supratentorial white matter is nonspecific but probably reflects similar chronic microvascular ischemic changes. Ventricles and sulci are within normal limits in size and configuration. Vascular: Hyperdensity of a proximal right M2 MCA branch. There is intracranial atherosclerotic calcification at the skull base. Skull: Unremarkable. Sinuses/Orbits: Minor mucosal thickening. Other: Mastoid air cells are clear. ASPECTS [facility] Stroke Program Early CT Score) - Ganglionic level infarction (caudate, lentiform nuclei, internal capsule, insula, M1-M3 cortex): 5 - Supraganglionic infarction (M4-M6 cortex): 3 Total score (0-10 with 10 being normal): 8 IMPRESSION: No acute intracranial hemorrhage. Acute infarction right MCA territory with hyperdense proximal right M2 MCA. ASPECT score is 8. These results were communicated to Dr. Otelia Limes at 1:18 pmon 05/31/2020 by text page via the [facility] messaging system. Electronically Signed   By: Guadlupe Spanish M.D.   On: 05/31/2020 13:30    Procedures Procedures (including critical care time)  Medications Ordered in ED Medications  sodium chloride flush (NS) 0.9 % injection 3 mL (has no administration in time range)  iohexol (OMNIPAQUE) 350 MG/ML injection 100 mL (100 mLs Intravenous Contrast Given 05/31/20 1337)    ED Course  I have reviewed the triage vital signs and the nursing notes.  Pertinent labs & imaging results that were available during my care of the patient were reviewed by me and considered in my medical decision making (see chart for details).    MDM Rules/Calculators/A&P                          Iv ns. Continuous pulse ox and cardiac monitoring. Stat ct and labs.  Pt was code stroke activation pta. Neurology/code stroke team consulted - at bedside.   Stat head ct.   Reviewed nursing notes and prior charts for additional history.   CT reviewed/interpreted by me - no hem, CT c/w acute cva. Neurology is managing patients care, and is facilitating emergent IR procedure.   Labs reviewed/interpreted by me - chem normal.   Plan for neurology admission s/o emergent IR procedure.      Final Clinical Impression(s) / ED Diagnoses Final diagnoses:  Stroke [facility])    Rx / DC Orders ED Discharge Orders    None       Cathren Laine, MD 05/31/20 1418

## 2020-05-31 NOTE — H&P (Signed)
Admission H&P    Chief Complaint: Acute onset of left facial droop, slurred speech and left sided weakness  HPI: Mark Herrera is an 59 y.o. male with a PMHx of stroke a year ago yesterday which was treated with tPA followed by adverse effect of angioedema, CAD s/p DES, gout, tobacco abuse who presents acutely as a Code Stroke via EMS for acute onset of left facial droop, slurred speech and left sided weakness. LKN was 1045 which was the time of symptom onset.   He states that he smoked marijuana this AM which was laced with cocaine powder. He states that he was partying. When removing one of his socks during exam a small pea-sized white lump wrapped in plastic wrap, bearing a resemblance to hardened sugar, fell out of his sock. It was taken into custody for further analysis by one of the RNs responding to the Code Stroke.   Medications list in Epic consists of ASA, Plavix and atorvastatin. However, the patient states he is not taking any medications. He also has an allergy to atorvastatin + Plavix listed.   LSN: [id number] tPA Given: No: Prior adverse effect to tPA of angioedema  Past Medical History:  Diagnosis Date  . CAD (coronary artery disease)    10/08/16 STEMI DES x1 to pLAD EF 45%  . Gout   . Tobacco abuse     Past Surgical History:  Procedure Laterality Date  . CORONARY STENT INTERVENTION N/A 10/08/2016   Procedure: Coronary Stent Intervention;  Surgeon: Kathleene Hazel, MD;  Location: [facility] INVASIVE CV LAB;  Service: Cardiovascular;  Laterality: N/A;  . LEFT HEART CATH AND CORONARY ANGIOGRAPHY N/A 10/08/2016   Procedure: Left Heart Cath and Coronary Angiography;  Surgeon: Kathleene Hazel, MD;  Location: [facility] INVASIVE CV LAB;  Service: Cardiovascular;  Laterality: N/A;  . TOE SURGERY      Family History  Problem Relation Age of Onset  . Hypertension Father    Social History:  reports that he has been smoking cigarettes. He has been smoking about 1.00 pack per day. He  has never used smokeless tobacco. He reports current alcohol use. He reports current drug use. Drug: Marijuana.  Allergies:  Allergies  Allergen Reactions  . Atorvastatin     Patient developed rash after discharge while taking Lipitor + Plavix  . Clopidogrel     Patient developed rash after discharge while taking Lipitor + Plavix    ROS: As per HPI. Does not endorse any additional complaints except for an 8/10 right frontal headache and left sided vision loss.   Physical Examination: There were no vitals taken for this visit.  HEENT-  [place]/AT  Lungs - Respirations unlabored Extremities - No edema  Neurologic Examination: Mental Status: Awake and alert. Fully oriented. Speech slow with dysarthria. In this context fluency is intact with normal comprehension and naming. Able to follow all commands without difficulty. Cranial Nerves: II:  Left sided visual field cut. PERRL.  III,IV, VI: Squints right eye due to acute vision deficit. EOM are full when eyelids are held open. No nystagmus.  V,VII: Temp sensation intact bilaterally. Prominent left facial droop.  VIII: hearing intact to voice IX,X: Nasal quality to speech.  XI: Head is midline XII: Tongue deviates to left Motor: RUE and RLE 5/5 LUE 3 to 4-/5 proximally and distally LLE 4/5 proximally with 5/5 APF/ADF Sensory: Temp and FT intact bilaterally in upper and lower extremities. No extinction to DSS.  Deep Tendon Reflexes:  2+ bilateral upper  and lower extremities.  Toes downgoing bilaterally  Cerebellar: No ataxia disproportionate to weakness on the left, but significant slowing and dyssinergia. Right FNF normal.  Gait: Deferred  Results for orders placed or performed during the hospital encounter of 05/31/20 (from the past 48 hour(s))  CBG monitoring, ED     Status: Abnormal   Collection Time: 05/31/20  1:05 PM  Result Value Ref Range   Glucose-Capillary 104 (H) 70 - 99 mg/dL    Comment: Glucose reference range applies  only to samples taken after fasting for at least 8 hours.  I-stat chem 8, ED     Status: Abnormal   Collection Time: 05/31/20  1:11 PM  Result Value Ref Range   Sodium 140 135 - 145 mmol/L   Potassium 4.1 3.5 - 5.1 mmol/L   Chloride 102 98 - 111 mmol/L   BUN 7 6 - 20 mg/dL   Creatinine, Ser 4.09 0.61 - 1.24 mg/dL   Glucose, Bld 811 (H) 70 - 99 mg/dL    Comment: Glucose reference range applies only to samples taken after fasting for at least 8 hours.   Calcium, Ion 1.14 (L) 1.15 - 1.40 mmol/L   TCO2 26 22 - 32 mmol/L   Hemoglobin 15.3 13.0 - 17.0 g/dL   HCT 91.4 78.2 - 95.6 %  Protime-INR     Status: None   Collection Time: 05/31/20  1:12 PM  Result Value Ref Range   Prothrombin Time 13.4 11.4 - 15.2 seconds   INR 1.1 0.8 - 1.2    Comment: (NOTE) INR goal varies based on device and disease states. Performed at [facility], [address] [place], [place] [address]   APTT     Status: None   Collection Time: 05/31/20  1:12 PM  Result Value Ref Range   aPTT 28 24 - 36 seconds    Comment: Performed at [facility], [address], [place], [place] [address]  CBC     Status: None   Collection Time: 05/31/20  1:12 PM  Result Value Ref Range   WBC 6.0 4.0 - 10.5 K/uL   RBC 4.88 4.22 - 5.81 MIL/uL   Hemoglobin 14.1 13.0 - 17.0 g/dL   HCT 69.6 29.5 - 28.4 %   MCV 91.0 80.0 - 100.0 fL   MCH 28.9 26.0 - 34.0 pg   MCHC 31.8 30.0 - 36.0 g/dL   RDW 13.2 44.0 - 10.2 %   Platelets 208 150 - 400 K/uL   nRBC 0.0 0.0 - 0.2 %    Comment: Performed at [facility], [address] [place], [place] [address]  Differential     Status: None   Collection Time: 05/31/20  1:12 PM  Result Value Ref Range   Neutrophils Relative % 77 %   Neutro Abs 4.6 1.7 - 7.7 K/uL   Lymphocytes Relative 14 %   Lymphs Abs 0.8 0.7 - 4.0 K/uL   Monocytes Relative 5 %   Monocytes Absolute 0.3 0.1 - 1.0 K/uL   Eosinophils Relative 4 %   Eosinophils Absolute 0.2 0.0 - 0.5 K/uL   Basophils  Relative 0 %   Basophils Absolute 0.0 0.0 - 0.1 K/uL   Immature Granulocytes 0 %   Abs Immature Granulocytes 0.02 0.00 - 0.07 K/uL    Comment: Performed at [facility], [address] [place], [place] [address]   CT Code Stroke CTA Head W/WO contrast  Result Date: 05/31/2020 CLINICAL  DATA:  Left-sided weakness, slurred speech EXAM: CT ANGIOGRAPHY HEAD AND NECK CT PERFUSION BRAIN TECHNIQUE: Multidetector CT imaging of the head and neck was performed using the standard protocol during bolus administration of intravenous contrast. Multiplanar CT image reconstructions and MIPs were obtained to evaluate the vascular anatomy. Carotid stenosis measurements (when applicable) are obtained utilizing NASCET criteria, using the distal internal carotid diameter as the denominator. Multiphase CT imaging of the brain was performed following IV bolus contrast injection. Subsequent parametric perfusion maps were calculated using RAPID software. CONTRAST:  OMNIPAQUE IOHEXOL 350 MG/ML SOLN COMPARISON:  05/31/2019 FINDINGS: CTA NECK FINDINGS Aortic arch: Great vessel origins are patent. Right carotid system: Patent.  No measurable stenosis. Left carotid system: Patent. Trace mixed plaque at the ICA origin. No measurable stenosis. Vertebral arteries: Patent. Left vertebral artery is dominant with mild plaque at the origin. No measurable stenosis. Skeleton: Advanced degenerative changes of the cervical spine primarily from C3-C4 to C5-C6. Other neck: No mass or adenopathy. Upper chest: No apical lung mass. Review of the MIP images confirms the above findings CTA HEAD FINDINGS Anterior circulation: Intracranial internal carotid arteries are patent with minimal calcified plaque. Right M1 MCA is patent to the bifurcation where there is occlusion. Partial reconstitution of M2 MCA and more distal branches. Left middle cerebral and bilateral anterior cerebral arteries are patent. Anterior communicating artery is present.  Posterior circulation: Intracranial vertebral arteries, basilar artery, and posterior cerebral arteries are patent. Venous sinuses: As permitted by contrast timing, patent. Review of the MIP images confirms the above findings CT Brain Perfusion Findings: CBF (<30%) Volume: 73mL Perfusion (Tmax>6.0s) volume: Mismatch Volume: 84mL Infarction Location: Right MCA territory IMPRESSION: Right M1 MCA bifurcation occlusion with partial reconstitution of M2 MCA and more distal branches. Perfusion imaging demonstrates 27 mL of core infarction and 90 mL of penumbra within the right MCA territory. No hemodynamically significant stenosis in the neck These results were communicated to Dr. Otelia Limes at 1:39 pm on 05/31/2020 by text page via the [facility] messaging system. Electronically Signed   By: Guadlupe Spanish M.D.   On: 05/31/2020 13:47   CT Code Stroke CTA Neck W/WO contrast  Result Date: 05/31/2020 CLINICAL DATA:  Left-sided weakness, slurred speech EXAM: CT ANGIOGRAPHY HEAD AND NECK CT PERFUSION BRAIN TECHNIQUE: Multidetector CT imaging of the head and neck was performed using the standard protocol during bolus administration of intravenous contrast. Multiplanar CT image reconstructions and MIPs were obtained to evaluate the vascular anatomy. Carotid stenosis measurements (when applicable) are obtained utilizing NASCET criteria, using the distal internal carotid diameter as the denominator. Multiphase CT imaging of the brain was performed following IV bolus contrast injection. Subsequent parametric perfusion maps were calculated using RAPID software. CONTRAST:  OMNIPAQUE IOHEXOL 350 MG/ML SOLN COMPARISON:  05/31/2019 FINDINGS: CTA NECK FINDINGS Aortic arch: Great vessel origins are patent. Right carotid system: Patent.  No measurable stenosis. Left carotid system: Patent. Trace mixed plaque at the ICA origin. No measurable stenosis. Vertebral arteries: Patent. Left vertebral artery is dominant with mild plaque  at the origin. No measurable stenosis. Skeleton: Advanced degenerative changes of the cervical spine primarily from C3-C4 to C5-C6. Other neck: No mass or adenopathy. Upper chest: No apical lung mass. Review of the MIP images confirms the above findings CTA HEAD FINDINGS Anterior circulation: Intracranial internal carotid arteries are patent with minimal calcified plaque. Right M1 MCA is patent to the bifurcation where there is occlusion. Partial reconstitution of M2 MCA and more distal branches. Left middle  cerebral and bilateral anterior cerebral arteries are patent. Anterior communicating artery is present. Posterior circulation: Intracranial vertebral arteries, basilar artery, and posterior cerebral arteries are patent. Venous sinuses: As permitted by contrast timing, patent. Review of the MIP images confirms the above findings CT Brain Perfusion Findings: CBF (<30%) Volume: 68mL Perfusion (Tmax>6.0s) volume: Mismatch Volume: 42mL Infarction Location: Right MCA territory IMPRESSION: Right M1 MCA bifurcation occlusion with partial reconstitution of M2 MCA and more distal branches. Perfusion imaging demonstrates 27 mL of core infarction and 90 mL of penumbra within the right MCA territory. No hemodynamically significant stenosis in the neck These results were communicated to Dr. Otelia Limes at 1:39 pm on 05/31/2020 by text page via the [facility] messaging system. Electronically Signed   By: Guadlupe Spanish M.D.   On: 05/31/2020 13:47   CT Code Stroke Cerebral Perfusion with contrast  Result Date: 05/31/2020 CLINICAL DATA:  Left-sided weakness, slurred speech EXAM: CT ANGIOGRAPHY HEAD AND NECK CT PERFUSION BRAIN TECHNIQUE: Multidetector CT imaging of the head and neck was performed using the standard protocol during bolus administration of intravenous contrast. Multiplanar CT image reconstructions and MIPs were obtained to evaluate the vascular anatomy. Carotid stenosis measurements (when applicable) are obtained  utilizing NASCET criteria, using the distal internal carotid diameter as the denominator. Multiphase CT imaging of the brain was performed following IV bolus contrast injection. Subsequent parametric perfusion maps were calculated using RAPID software. CONTRAST:  OMNIPAQUE IOHEXOL 350 MG/ML SOLN COMPARISON:  05/31/2019 FINDINGS: CTA NECK FINDINGS Aortic arch: Great vessel origins are patent. Right carotid system: Patent.  No measurable stenosis. Left carotid system: Patent. Trace mixed plaque at the ICA origin. No measurable stenosis. Vertebral arteries: Patent. Left vertebral artery is dominant with mild plaque at the origin. No measurable stenosis. Skeleton: Advanced degenerative changes of the cervical spine primarily from C3-C4 to C5-C6. Other neck: No mass or adenopathy. Upper chest: No apical lung mass. Review of the MIP images confirms the above findings CTA HEAD FINDINGS Anterior circulation: Intracranial internal carotid arteries are patent with minimal calcified plaque. Right M1 MCA is patent to the bifurcation where there is occlusion. Partial reconstitution of M2 MCA and more distal branches. Left middle cerebral and bilateral anterior cerebral arteries are patent. Anterior communicating artery is present. Posterior circulation: Intracranial vertebral arteries, basilar artery, and posterior cerebral arteries are patent. Venous sinuses: As permitted by contrast timing, patent. Review of the MIP images confirms the above findings CT Brain Perfusion Findings: CBF (<30%) Volume: 65mL Perfusion (Tmax>6.0s) volume: Mismatch Volume: 41mL Infarction Location: Right MCA territory IMPRESSION: Right M1 MCA bifurcation occlusion with partial reconstitution of M2 MCA and more distal branches. Perfusion imaging demonstrates 27 mL of core infarction and 90 mL of penumbra within the right MCA territory. No hemodynamically significant stenosis in the neck These results were communicated to Dr. Otelia Limes at 1:39  pm on 05/31/2020 by text page via the [facility] messaging system. Electronically Signed   By: Guadlupe Spanish M.D.   On: 05/31/2020 13:47   CT HEAD CODE STROKE WO CONTRAST  Result Date: 05/31/2020 CLINICAL DATA:  Code stroke.  Left-sided weakness, slurred speech EXAM: CT HEAD WITHOUT CONTRAST TECHNIQUE: Contiguous axial images were obtained from the base of the skull through the vertex without intravenous contrast. COMPARISON:  05/31/2019 FINDINGS: Brain: There is no acute intracranial hemorrhage. New loss of gray-white differentiation in the right MCA territory along the right insula. There is also suspected involvement the inferior parietal cortex. There is no mass effect. Additional  patchy hypoattenuation in the supratentorial white matter is nonspecific but probably reflects similar chronic microvascular ischemic changes. Ventricles and sulci are within normal limits in size and configuration. Vascular: Hyperdensity of a proximal right M2 MCA branch. There is intracranial atherosclerotic calcification at the skull base. Skull: Unremarkable. Sinuses/Orbits: Minor mucosal thickening. Other: Mastoid air cells are clear. ASPECTS [facility]([place] Stroke Program Early CT Score) - Ganglionic level infarction (caudate, lentiform nuclei, internal capsule, insula, M1-M3 cortex): 5 - Supraganglionic infarction (M4-M6 cortex): 3 Total score (0-10 with 10 being normal): 8 IMPRESSION: No acute intracranial hemorrhage. Acute infarction right MCA territory with hyperdense proximal right M2 MCA. ASPECT score is 8. These results were communicated to Dr. Otelia LimesLindzen at 1:18 pmon 05/31/2020 by text page via the [facility] messaging system. Electronically Signed   By: Guadlupe SpanishPraneil  Patel M.D.   On: 05/31/2020 13:30    Assessment: 59 y.o. male presenting with acute onset of left facial droop, slurred speech and left sided weakness 1. Exam reveals left sided deficits referable to a probable right MCA territory acute ischemic infarction.  2. CT head:  No acute intracranial hemorrhage. Acute infarction right MCA territory with hyperdense proximal right M2 MCA. ASPECT score is 8. 3. CTA of head and neck with CTP: Right M1 MCA bifurcation occlusion with partial reconstitution of M2 MCA and more distal branches. Perfusion imaging demonstrates 27 mL of core infarction and 90 mL of penumbra within the right MCA territory. No hemodynamically significant stenosis in the neck.  4. Stroke Risk Factors - prior stroke, CAD, tobacco abuse and cocaine use 5. The patient is not an IV tPA candidate due to prior adverse reaction with angioedema, which could be life threatening. Also not a tenecteplase candidate as per Pharmacy this medication has the potential to cross react and also result in angioedema.  6. The patient is a candidate for endovascular thrombectomy. Discussed extensively the risks/benefits of thrombectomy treatment vs. no treatment with the patient, including approximate 10% risk of SAH with thrombectomy, which carries significant chance for death or long morbidity, versus worse overall outcomes on average in patients within thrombectomy 24 hour time window who do not undergo the procedure. Overall benefits of thrombectomy regarding long-term prognosis are felt to outweigh risks. The patient expressed understanding and wish to proceed with thrombectomy. Consent form signed.  Plan: 1. Admitting to Neuro ICU.  2. Post-thrombectomy order set to include frequent neuro checks and BP management.  3. No antiplatelet medications or anticoagulants for at least 24 hours following thrombectomy.  4. DVT prophylaxis with SCDs.  5. Will need to be started on a statin.  6. Will need to start antiplatelet therapy if follow up CT at 24 hours is negative for hemorrhagic conversion. 7. Telemetry monitoring. 8. TTE.  9. MRI brain 10. PT/OT/Speech.  11. NPO until passes swallow evaluation.  12. Fasting lipid panel, HgbA1c 13. Smoking and substance abuse  counseling  60 minutes spent in the emergent neurological evaluation and management of this critically ill patient.   Electronically signed: Dr. Caryl PinaEric Mang Hazelrigg 05/31/2020, 1:53 PM

## 2020-05-31 NOTE — Anesthesia Procedure Notes (Addendum)
Procedure Name: Intubation Date/Time: 05/31/2020 2:06 PM Performed by: Ezekiel Ina, CRNA Pre-anesthesia Checklist: Patient identified, Emergency Drugs available, Suction available and Patient being monitored Patient Re-evaluated:Patient Re-evaluated prior to induction Oxygen Delivery Method: Circle system utilized Preoxygenation: Pre-oxygenation with 100% oxygen Induction Type: IV induction Ventilation: Mask ventilation without difficulty Laryngoscope Size: Glidescope and 4 Grade View: Grade I Tube type: Oral Tube size: 7.5 mm Number of attempts: 1 Airway Equipment and Method: Stylet and Video-laryngoscopy Placement Confirmation: ETT inserted through vocal cords under direct vision,  positive ETCO2 and breath sounds checked- equal and bilateral Secured at: 23 cm Tube secured with: Tape Dental Injury: Teeth and Oropharynx as per pre-operative assessment

## 2020-05-31 NOTE — Consult Note (Signed)
NAME:  Mark Herrera, MRN:  [id number], DOB:  [birth year], LOS: 0 ADMISSION DATE:  05/31/2020, CONSULTATION DATE: 12/29 REFERRING MD: Dr. Loreta Ave, CHIEF COMPLAINT:  Left sided weakness    Brief History:  59 y/o M admitted 12/29 with acute left sided weakness & facial droop.  Found to have acute right MCA occlusion.  Did not receive tPA due to concerns of prior angioedema after tPA, to [facility] for thrombectomy. Remained intubated post-procedure.   History of Present Illness:  59 y/o M who presented to [facility] on 12/29 via EMS with left sided facial droop, weakness and slurred speech.    The patient has a hx of CVA 1 year ago and apparently had angioedema after receiving tPA.    He was noticed to have acute symptoms 2 hours prior to presentation. Symptoms included left facial droop, left arm weakness, left leg weakness, decreased sensation on the left, & dysarthria.  No reported hx of fall or trauma. He was last known normal at 10:45am.  He was alert / oriented on presentation. Reported he smoked THC which was laced with cocaine on am of admit - reports he was partying. Found with white powder rock in his sock on initial exam. CODE STROKE was activated and CT of the head demonstrated no acute hemorrhage.  He was taken to [facility] for emergent thrombectomy.  The patient remained intubated post procedure as COVID testing was pending and returned to ICU. UDS pending.  Initial labs - Na 139, K 4.1, Cl 104, CO2 26, glucose 109, BUN <5, Sr Cr 0.96, albumin 3.6, WBC 6, hgb 14.1, and platelets 208.    PCCM consulted for evaluation.   Past Medical History:  CAD  CVA - 2020  Tobacco Abuse - 1ppd  Gout  Polysubstance Abuse - THC, cocaine   Significant Hospital Events:  12/29 Admit with acute L sided weakness   Consults:    Procedures:  ETT 12/29 >>  L Radial Aline 12/29 >>   Significant Diagnostic Tests:   CT Head Code Stroke 12/29 >> no acute intracranial hemorrhage, acute infarction right MCA territory  with hyperdense proximal right M2 MCA.   Micro Data:  COVID 12/29 >> negative  Influenza 12/29 >> negative   Antimicrobials:    Interim History / Subjective:  Pt transported to intensive care in stable condition, covid-19 negative and starting to wake up  Objective   Blood pressure 133/87, pulse (!) 57, temperature (!) 97.5 F (36.4 C), temperature source Oral, resp. rate 14, SpO2 100 %.       No intake or output data in the 24 hours ending 05/31/20 1536 There were no vitals filed for this visit.   General: well-nourished, Intubated M, starting to wake up but not in acute distress HEENT: MM pink/moist, ETT in place Neuro: spontaneously moving the R arm and leg, no movement of the extremities on the L, no facial droop CV: s1s2 rrr, no m/r/g PULM:  Mechanical breath sounds bilaterally on PRVC, no wheezing or rhonchi GI: soft, bsx4 active  Extremities: warm/dry, no edema  Skin: no rashes or lesions   Resolved Hospital Problem list      Assessment & Plan:   Acute R MCA CVA  S/p NIR thrombectomy. Hx of angioedema to tPA, not given. Hx CVA 1 year ago.   -monitor in ICU  -frequent neuro exams  -early PT efforts  -will need SLP evaluation post extubation   Acute Respiratory Insufficiency secondary to CVA Tobacco Abuse  -ok to  extubate once COVID negative, -PRVC 8cc/kg as rest mode -wean PEEP / fiO2 for sats >90% -SBT / WUA with goal for extubation  -follow intermittent CXR   waking up and plan to remove ETT this evening   HTN  -cleviprex for BP, goal per neurology  CAD s/p DES HLD  Med review shows ASA, plavix, lipitor  -hold home meds for now  Polysubstance Abuse  Prior hx of cocaine, THC, tobacco, ETOH. Reported he smoked THC which was laced with cocaine on am of admit. Found with white powder rock in his sock on initial exam.  -smoking cessation counseling when able  -await UDS   Best practice (evaluated daily)  Diet: NPO Pain/Anxiety/Delirium protocol  (if indicated): propofol VAP protocol (if indicated): yes DVT prophylaxis: SCD's GI prophylaxis: hold as being extubated today Glucose control: POC glucose checks Mobility: best rest Disposition: ICU  Goals of Care:  Last date of multidisciplinary goals of care discussion: pending  Family and staff present:  Summary of discussion:  Follow up goals of care discussion due: 1/5 Code Status: Full Code   Labs   CBC: Recent Labs  Lab 05/31/20 1311 05/31/20 1312  WBC  --  6.0  NEUTROABS  --  4.6  HGB 15.3 14.1  HCT 45.0 44.4  MCV  --  91.0  PLT  --  208    Basic Metabolic Panel: Recent Labs  Lab 05/31/20 1311 05/31/20 1312  NA 140 139  K 4.1 4.1  CL 102 104  CO2  --  26  GLUCOSE 103* 109*  BUN 7 <5*  CREATININE 0.90 0.96  CALCIUM  --  9.3   GFR: CrCl cannot be calculated (Unknown ideal weight.). Recent Labs  Lab 05/31/20 1312  WBC 6.0    Liver Function Tests: Recent Labs  Lab 05/31/20 1312  AST 19  ALT 13  ALKPHOS 80  BILITOT 0.6  PROT 7.1  ALBUMIN 3.6   No results for input(s): LIPASE, AMYLASE in the last 168 hours. No results for input(s): AMMONIA in the last 168 hours.  ABG    Component Value Date/Time   TCO2 26 05/31/2020 1311     Coagulation Profile: Recent Labs  Lab 05/31/20 1312  INR 1.1    Cardiac Enzymes: No results for input(s): CKTOTAL, CKMB, CKMBINDEX, TROPONINI in the last 168 hours.  HbA1C: Hgb A1c MFr Bld  Date/Time Value Ref Range Status  06/01/2019 05:53 AM 5.4 4.8 - 5.6 % Final    Comment:    (NOTE) Pre diabetes:          5.7%-6.4% Diabetes:              >6.4% Glycemic control for   <7.0% adults with diabetes     CBG: Recent Labs  Lab 05/31/20 1305  GLUCAP 104*    Review of Systems:   Unable to complete as patient is on mechanical ventilation.   Past Medical History:  He,  has a past medical history of CAD (coronary artery disease), Gout, and Tobacco abuse.   Surgical History:   Past Surgical  History:  Procedure Laterality Date  . CORONARY STENT INTERVENTION N/A 10/08/2016   Procedure: Coronary Stent Intervention;  Surgeon: Kathleene Hazel, MD;  Location: [facility] INVASIVE CV LAB;  Service: Cardiovascular;  Laterality: N/A;  . LEFT HEART CATH AND CORONARY ANGIOGRAPHY N/A 10/08/2016   Procedure: Left Heart Cath and Coronary Angiography;  Surgeon: Kathleene Hazel, MD;  Location: [facility] INVASIVE CV LAB;  Service: Cardiovascular;  Laterality: N/A;  . TOE SURGERY       Social History:   reports that he has been smoking cigarettes. He has been smoking about 1.00 pack per day. He has never used smokeless tobacco. He reports current alcohol use. He reports current drug use. Drug: Marijuana.   Family History:  His family history includes Hypertension in his father.   Allergies Allergies  Allergen Reactions  . Atorvastatin     Patient developed rash after discharge while taking Lipitor + Plavix  . Clopidogrel     Patient developed rash after discharge while taking Lipitor + Plavix     Home Medications  Prior to Admission medications   Medication Sig Start Date End Date Taking? Authorizing Provider  aspirin EC 81 MG EC tablet Take 1 tablet (81 mg total) by mouth daily. 10/12/16   Arty Baumgartner, NP  atorvastatin (LIPITOR) 40 MG tablet Take 1 tablet (40 mg total) by mouth daily at 6 PM. Patient not taking: Reported on 06/24/2019 06/01/19   Layne Benton, NP  clopidogrel (PLAVIX) 75 MG tablet Take 1 tablet (75 mg total) by mouth daily. Patient not taking: Reported on 06/24/2019 06/02/19   Layne Benton, NP     Critical care time: 40 minutes      CRITICAL CARE Performed by: Darcella Gasman Pasqual Farias   Total critical care time: 40 minutes  Critical care time was exclusive of separately billable procedures and treating other patients.  Critical care was necessary to treat or prevent imminent or life-threatening deterioration.  Critical care was time spent personally by me on  the following activities: development of treatment plan with patient and/or surrogate as well as nursing, discussions with consultants, evaluation of patient's response to treatment, examination of patient, obtaining history from patient or surrogate, ordering and performing treatments and interventions, ordering and review of laboratory studies, ordering and review of radiographic studies, pulse oximetry and re-evaluation of patient's condition.   Darcella Gasman Corian Handley, PA-C [facility]  Pager# [phone], if no answer [phone]

## 2020-05-31 NOTE — Anesthesia Preprocedure Evaluation (Signed)
Anesthesia Evaluation  Patient identified by MRN, date of birth, ID band Patient awake    Reviewed: Allergy & Precautions, NPO status , Patient's Chart, lab work & pertinent test results  Airway Mallampati: II  TM Distance: >3 FB Neck ROM: Full    Dental  (+) Poor Dentition, Dental Advisory Given, Chipped   Pulmonary Current Smoker,  Heavy smoker, currently coughing Says he is tested every week for COVID for his job and his been negative   Pulmonary exam normal breath sounds clear to auscultation       Cardiovascular hypertension, + CAD, + Past MI and + Cardiac Stents (2018 DES to LAD)  Normal cardiovascular exam Rhythm:Regular Rate:Normal     Neuro/Psych Code stroke  Obvious right facial droop Was here about 1year ago with similar symptoms, had TPA CVA, Residual Symptoms negative psych ROS   GI/Hepatic negative GI ROS, (+)     substance abuse  cocaine use and marijuana use, Recent cocaine use    Endo/Other  negative endocrine ROS  Renal/GU negative Renal ROS  negative genitourinary   Musculoskeletal negative musculoskeletal ROS (+)   Abdominal   Peds  Hematology negative hematology ROS (+) hct 44.4, plt 208   Anesthesia Other Findings   Reproductive/Obstetrics negative OB ROS                             Anesthesia Physical Anesthesia Plan  ASA: IV and emergent  Anesthesia Plan: General   Post-op Pain Management:    Induction: Intravenous and Rapid sequence  PONV Risk Score and Plan: 1 and Ondansetron and Treatment may vary due to age or medical condition  Airway Management Planned: Oral ETT  Additional Equipment: Arterial line  Intra-op Plan:   Post-operative Plan: Extubation in OR and Possible Post-op intubation/ventilation  Informed Consent: I have reviewed the patients History and Physical, chart, labs and discussed the procedure including the risks, benefits and  alternatives for the proposed anesthesia with the patient or authorized representative who has indicated his/her understanding and acceptance.     Dental advisory given and Only emergency history available  Plan Discussed with: CRNA  Anesthesia Plan Comments:         Anesthesia Quick Evaluation

## 2020-05-31 NOTE — Code Documentation (Signed)
Patient coming from home with complaints of sudden onset of left sided weakness and slurred speech that started today at 1045. Pt was doing marijuana laced with cocaine when his girlfriend witnessed the sudden change. EMS was called and activated a Code Stroke.   Patient has hx of episode that took place exactly a year ago on 05/31/2019 with sudden onset of right arm and leg weakness. Pt was given tPA with angioedema reaction. MRI negative. Discharged home with no deficits.   Upon arrival, pt noted to have left facial droop, left hemianopia, left arm and leg weakness with dysarthria. CT, CTA completed. Pt was not a candidate for tPA due to allergic reaction prior. Per [place], CTA showed M1 occlusion. IR activated and patient taken to IR. Handoff with Jamesetta Orleans, RN. See Stroke Timeline for details.

## 2020-05-31 NOTE — Code Documentation (Addendum)
Stroke Response Documentation Code Documentation  Mark Herrera is a 58 y.o. male arriving to [facility] ED via [facility] on 05/31/2020 with past medical hx of CAD & tobacco abuse. Code stroke was activated by EMS. Patient from home where he was LKW at 62 when his girlfriend witnessed him having sudden onset of left sided weakness. On No antithrombotic.   Stroke team at the bedside on patient arrival. Labs drawn and patient cleared for CT by Dr. Denton Lank. Patient to CT with team. NIHSS 10, see documentation for details and code stroke times. Patient with left hemianopia, left facial droop, left arm weakness, left leg weakness, left decreased sensation and dysarthria  on exam. The following imaging was completed: CT, CTA head and neck, CTP. Patient is not a candidate for tPA due to having a prior adverse reaction to tPA. IR activated and pt transported to IR.   Bedside handoff with IR RN Genelle Bal.    Mark Herrera  Rapid Response RN

## 2020-05-31 NOTE — Anesthesia Procedure Notes (Signed)
Arterial Line Insertion Start/End12/29/2021 2:15 PM, 05/31/2020 2:20 PM Performed by: Lannie Fields, DO, anesthesiologist  Patient location: Pre-op. Preanesthetic checklist: patient identified, IV checked, site marked, risks and benefits discussed, surgical consent, monitors and equipment checked, pre-op evaluation, timeout performed and anesthesia consent Lidocaine 1% used for infiltration Left, radial was placed Catheter size: 20 Fr Hand hygiene performed  and maximum sterile barriers used  Allen's test indicative of satisfactory collateral circulation Attempts: 1 Procedure performed without using ultrasound guided technique. Following insertion, dressing applied. Post procedure assessment: normal and unchanged  Patient tolerated the procedure well with no immediate complications.

## 2020-06-01 ENCOUNTER — Inpatient Hospital Stay (HOSPITAL_COMMUNITY): Payer: Self-pay

## 2020-06-01 ENCOUNTER — Encounter (HOSPITAL_COMMUNITY): Payer: Self-pay | Admitting: Radiology

## 2020-06-01 DIAGNOSIS — I6389 Other cerebral infarction: Secondary | ICD-10-CM

## 2020-06-01 DIAGNOSIS — F17200 Nicotine dependence, unspecified, uncomplicated: Secondary | ICD-10-CM

## 2020-06-01 DIAGNOSIS — F1410 Cocaine abuse, uncomplicated: Secondary | ICD-10-CM

## 2020-06-01 DIAGNOSIS — I427 Cardiomyopathy due to drug and external agent: Secondary | ICD-10-CM

## 2020-06-01 LAB — LIPID PANEL
Cholesterol: 138 mg/dL (ref 0–200)
HDL: 43 mg/dL (ref >40)
LDL Cholesterol: 84 mg/dL (ref 0–99)
Total CHOL/HDL Ratio: 3.2 RATIO
Triglycerides: 55 mg/dL (ref <150)
VLDL: 11 mg/dL (ref 0–40)

## 2020-06-01 LAB — HEMOGLOBIN A1C
Hgb A1c MFr Bld: 5.4 % (ref 4.8–5.6)
Mean Plasma Glucose: 108.28 mg/dL

## 2020-06-01 LAB — CBC
HCT: 43.6 % (ref 39.0–52.0)
Hemoglobin: 14 g/dL (ref 13.0–17.0)
MCH: 28.7 pg (ref 26.0–34.0)
MCHC: 32.1 g/dL (ref 30.0–36.0)
MCV: 89.5 fL (ref 80.0–100.0)
Platelets: 204 10*3/uL (ref 150–400)
RBC: 4.87 MIL/uL (ref 4.22–5.81)
RDW: 13.6 % (ref 11.5–15.5)
WBC: 6.5 10*3/uL (ref 4.0–10.5)
nRBC: 0 % (ref 0.0–0.2)

## 2020-06-01 LAB — GLUCOSE, CAPILLARY: Glucose-Capillary: 127 mg/dL — ABNORMAL HIGH (ref 70–99)

## 2020-06-01 LAB — ECHOCARDIOGRAM COMPLETE
Area-P 1/2: 4.15 cm2
Calc EF: 27.7 %
Height: 68.5 in
S' Lateral: 4.7 cm
Single Plane A2C EF: 26.1 %
Single Plane A4C EF: 27.7 %

## 2020-06-01 LAB — MRSA PCR SCREENING: MRSA by PCR: NEGATIVE

## 2020-06-01 LAB — BASIC METABOLIC PANEL
Anion gap: 9 (ref 5–15)
BUN: 10 mg/dL (ref 6–20)
CO2: 21 mmol/L — ABNORMAL LOW (ref 22–32)
Calcium: 8.6 mg/dL — ABNORMAL LOW (ref 8.9–10.3)
Chloride: 107 mmol/L (ref 98–111)
Creatinine, Ser: 1.01 mg/dL (ref 0.61–1.24)
GFR, Estimated: >60 mL/min (ref >60)
Glucose, Bld: 106 mg/dL — ABNORMAL HIGH (ref 70–99)
Potassium: 4.2 mmol/L (ref 3.5–5.1)
Sodium: 137 mmol/L (ref 135–145)

## 2020-06-01 LAB — MAGNESIUM: Magnesium: 2 mg/dL (ref 1.7–2.4)

## 2020-06-01 LAB — PHOSPHORUS: Phosphorus: 3 mg/dL (ref 2.5–4.6)

## 2020-06-01 MED ORDER — ATORVASTATIN CALCIUM 80 MG PO TABS: 80.0000 mg | ORAL_TABLET | Freq: Every day | ORAL | Status: DC

## 2020-06-01 MED ORDER — ASPIRIN 81 MG PO CHEW: 81.0000 mg | CHEWABLE_TABLET | Freq: Every day | ORAL | 0 refills | Status: DC

## 2020-06-01 MED ORDER — CLOPIDOGREL BISULFATE 75 MG PO TABS: 75.0000 mg | ORAL_TABLET | Freq: Every day | ORAL | 0 refills | Status: DC

## 2020-06-01 MED ORDER — ATORVASTATIN CALCIUM 40 MG PO TABS: 40.0000 mg | ORAL_TABLET | Freq: Every day | ORAL | Status: DC

## 2020-06-01 MED ORDER — ATORVASTATIN CALCIUM 80 MG PO TABS: 80.0000 mg | ORAL_TABLET | Freq: Every day | ORAL | 0 refills | Status: DC

## 2020-06-01 MED ORDER — ASPIRIN 81 MG PO CHEW: 81.0000 mg | CHEWABLE_TABLET | Freq: Every day | ORAL | Status: DC

## 2020-06-01 MED ORDER — CLOPIDOGREL BISULFATE 75 MG PO TABS: 75.0000 mg | ORAL_TABLET | Freq: Every day | ORAL | Status: DC

## 2020-06-01 MED ORDER — PERFLUTREN LIPID MICROSPHERE
1.0000 mL | INTRAVENOUS | Status: DC | PRN
  Administered 2020-06-01: 11:00:00 2.5 mL via INTRAVENOUS
  Filled 2020-06-01: qty 10

## 2020-06-01 NOTE — Progress Notes (Signed)
Referring Physician(s): Code stroke- Caryl PinaLindzen, Eric (neurology)  Supervising Physician: Gilmer MorWagner, Jaime  [facility]  [facility]  Chief Complaint: None  Subjective:  History of acute CVA s/p cerebral arteriogram with emergent mechanical thrombectomy of right MCA M1 occlusion achieving a TICI 2c revascularization via right femoral approach 05/31/2020 by Dr. Loreta AveWagner. Patient awake and alert sitting in chair with no complaints at this time. Can spontaneously move all extremities. Right femoral puncture site c/d/i.  MR brain this AM: 1. Acute cortical infarct of the right insula and superior right temporal lobe. No hemorrhage or mass effect. 2. Punctate focus of acute ischemia at the medial right temporal lobe.   Allergies: Patient has no active allergies.  Medications: Prior to Admission medications   Medication Sig Start Date End Date Taking? Authorizing Provider  aspirin EC 81 MG EC tablet Take 1 tablet (81 mg total) by mouth daily. 10/12/16   Arty Baumgartneroberts, Lindsay B, NP  atorvastatin (LIPITOR) 40 MG tablet Take 1 tablet (40 mg total) by mouth daily at 6 PM. Patient not taking: Reported on 06/24/2019 06/01/19   Layne BentonBiby, Sharon L, NP  clopidogrel (PLAVIX) 75 MG tablet Take 1 tablet (75 mg total) by mouth daily. Patient not taking: Reported on 06/24/2019 06/02/19   Layne BentonBiby, Sharon L, NP     Vital Signs: BP 128/80    Pulse 67    Temp 98.4 F (36.9 C) (Oral)    Resp (!) 24    Ht 5' 8.5" (1.74 m)    SpO2 97%    BMI 24.02 kg/m   Physical Exam Vitals and nursing note reviewed.  Constitutional:      General: He is not in acute distress.    Appearance: Normal appearance.  Pulmonary:     Effort: Pulmonary effort is normal. No respiratory distress.     Breath sounds: Normal breath sounds. No wheezing.  Skin:    General: Skin is warm and dry.     Comments: Right femoral puncture site soft without active bleeding or hematoma.  Neurological:     Mental Status: He is alert.     Comments:  Alert, awake, and oriented x3. Speech and comprehension intact. PERRL bilaterally. No facial asymmetry. Tongue midline. Can spontaneously move all extremities. No pronator drift. Distal pulses (DPs) 2+ bilaterally.     Imaging: CT Code Stroke CTA Head W/WO contrast  Result Date: 05/31/2020 CLINICAL DATA:  Left-sided weakness, slurred speech EXAM: CT ANGIOGRAPHY HEAD AND NECK CT PERFUSION BRAIN TECHNIQUE: Multidetector CT imaging of the head and neck was performed using the standard protocol during bolus administration of intravenous contrast. Multiplanar CT image reconstructions and MIPs were obtained to evaluate the vascular anatomy. Carotid stenosis measurements (when applicable) are obtained utilizing NASCET criteria, using the distal internal carotid diameter as the denominator. Multiphase CT imaging of the brain was performed following IV bolus contrast injection. Subsequent parametric perfusion maps were calculated using RAPID software. CONTRAST:  100mL OMNIPAQUE IOHEXOL 350 MG/ML SOLN COMPARISON:  05/31/2019 FINDINGS: CTA NECK FINDINGS Aortic arch: Great vessel origins are patent. Right carotid system: Patent.  No measurable stenosis. Left carotid system: Patent. Trace mixed plaque at the ICA origin. No measurable stenosis. Vertebral arteries: Patent. Left vertebral artery is dominant with mild plaque at the origin. No measurable stenosis. Skeleton: Advanced degenerative changes of the cervical spine primarily from C3-C4 to C5-C6. Other neck: No mass or adenopathy. Upper chest: No apical lung mass. Review of the MIP images confirms the above findings CTA HEAD FINDINGS  Anterior circulation: Intracranial internal carotid arteries are patent with minimal calcified plaque. Right M1 MCA is patent to the bifurcation where there is occlusion. Partial reconstitution of M2 MCA and more distal branches. Left middle cerebral and bilateral anterior cerebral arteries are patent. Anterior communicating  artery is present. Posterior circulation: Intracranial vertebral arteries, basilar artery, and posterior cerebral arteries are patent. Venous sinuses: As permitted by contrast timing, patent. Review of the MIP images confirms the above findings CT Brain Perfusion Findings: CBF (<30%) Volume: 27mL Perfusion (Tmax>6.0s) volume: Mismatch Volume: 90mL Infarction Location: Right MCA territory IMPRESSION: Right M1 MCA bifurcation occlusion with partial reconstitution of M2 MCA and more distal branches. Perfusion imaging demonstrates 27 mL of core infarction and 90 mL of penumbra within the right MCA territory. No hemodynamically significant stenosis in the neck These results were communicated to Dr. Otelia Limes at 1:39 pm on 05/31/2020 by text page via the [facility] messaging system. Electronically Signed   By: Guadlupe Spanish M.D.   On: 05/31/2020 13:47   CT Code Stroke CTA Neck W/WO contrast  Result Date: 05/31/2020 CLINICAL DATA:  Left-sided weakness, slurred speech EXAM: CT ANGIOGRAPHY HEAD AND NECK CT PERFUSION BRAIN TECHNIQUE: Multidetector CT imaging of the head and neck was performed using the standard protocol during bolus administration of intravenous contrast. Multiplanar CT image reconstructions and MIPs were obtained to evaluate the vascular anatomy. Carotid stenosis measurements (when applicable) are obtained utilizing NASCET criteria, using the distal internal carotid diameter as the denominator. Multiphase CT imaging of the brain was performed following IV bolus contrast injection. Subsequent parametric perfusion maps were calculated using RAPID software. CONTRAST:  OMNIPAQUE IOHEXOL 350 MG/ML SOLN COMPARISON:  05/31/2019 FINDINGS: CTA NECK FINDINGS Aortic arch: Great vessel origins are patent. Right carotid system: Patent.  No measurable stenosis. Left carotid system: Patent. Trace mixed plaque at the ICA origin. No measurable stenosis. Vertebral arteries: Patent. Left vertebral artery is dominant  with mild plaque at the origin. No measurable stenosis. Skeleton: Advanced degenerative changes of the cervical spine primarily from C3-C4 to C5-C6. Other neck: No mass or adenopathy. Upper chest: No apical lung mass. Review of the MIP images confirms the above findings CTA HEAD FINDINGS Anterior circulation: Intracranial internal carotid arteries are patent with minimal calcified plaque. Right M1 MCA is patent to the bifurcation where there is occlusion. Partial reconstitution of M2 MCA and more distal branches. Left middle cerebral and bilateral anterior cerebral arteries are patent. Anterior communicating artery is present. Posterior circulation: Intracranial vertebral arteries, basilar artery, and posterior cerebral arteries are patent. Venous sinuses: As permitted by contrast timing, patent. Review of the MIP images confirms the above findings CT Brain Perfusion Findings: CBF (<30%) Volume: 27mL Perfusion (Tmax>6.0s) volume: Mismatch Volume: 90mL Infarction Location: Right MCA territory IMPRESSION: Right M1 MCA bifurcation occlusion with partial reconstitution of M2 MCA and more distal branches. Perfusion imaging demonstrates 27 mL of core infarction and 90 mL of penumbra within the right MCA territory. No hemodynamically significant stenosis in the neck These results were communicated to Dr. Otelia Limes at 1:39 pm on 05/31/2020 by text page via the [facility] messaging system. Electronically Signed   By: Guadlupe Spanish M.D.   On: 05/31/2020 13:47   MR BRAIN WO CONTRAST  Result Date: 06/01/2020 CLINICAL DATA:  Stroke follow-up EXAM: MRI HEAD WITHOUT CONTRAST TECHNIQUE: Multiplanar, multiecho pulse sequences of the brain and surrounding structures were obtained without intravenous contrast. COMPARISON:  Brain MRI 06/01/2019 CT perfusion scan 05/31/2020 FINDINGS: Brain: Acute cortical infarct  of the right insula and superior right temporal lobe. Punctate focus of acute ischemia at the medial right temporal  lobe. No acute or chronic hemorrhage. There is multifocal hyperintense T2-weighted signal within the white matter. Parenchymal volume and CSF spaces are normal. The midline structures are normal. Vascular: Major flow voids are preserved. Skull and upper cervical spine: Normal calvarium and skull base. Visualized upper cervical spine and soft tissues are normal. Sinuses/Orbits:No paranasal sinus fluid levels or advanced mucosal thickening. No mastoid or middle ear effusion. Normal orbits. IMPRESSION: 1. Acute cortical infarct of the right insula and superior right temporal lobe. No hemorrhage or mass effect. 2. Punctate focus of acute ischemia at the medial right temporal lobe. Electronically Signed   By: Deatra Robinson M.D.   On: 06/01/2020 02:26   IR US Guide Vasc Access Right  Result Date: 05/31/2020 INDICATION: 59 year old male presents with acute right MCA syndrome, corresponding to right M1 occlusion. He presents for cervical angiogram and attempt at thrombectomy EXAM: ULTRASOUND-GUIDED ACCESS RIGHT COMMON FEMORAL ARTERY RIGHT CERVICAL AND CEREBRAL ANGIOGRAM MECHANICAL THROMBECTOMY OF RIGHT MCA CELT DEVICE FOR HEMOSTASIS COMPARISON:  CT imaging same day, CT imaging 1 year ago MEDICATIONS: None ANESTHESIA/SEDATION: The anesthesia team was present to provide general endotracheal tube anesthesia and for patient monitoring during the procedure. Intubation was performed in negative pressure [place] in neuro IR holding. Left radial arterial line was performed by the anesthesia team. Interventional neuro radiology nursing staff was also present. CONTRAST:  35 cc contrast FLUOROSCOPY TIME:  Fluoroscopy Time: 17 minutes 18 seconds (773 mGy). COMPLICATIONS: None TECHNIQUE: Informed written consent was obtained from the patient after a thorough discussion of the procedural risks, benefits and alternatives. Specific risks discussed include: Bleeding, infection, contrast reaction, kidney injury/failure, need for further  procedure/surgery, arterial injury or dissection, embolization to new territory, intracranial hemorrhage (10-15% risk), neurologic deterioration, cardiopulmonary collapse, death. All questions were addressed. Maximal Sterile Barrier Technique was utilized including during the procedure including caps, mask, sterile gowns, sterile gloves, sterile drape, hand hygiene and skin antiseptic. A timeout was performed prior to the initiation of the procedure. The anesthesia team was present to provide general endotracheal tube anesthesia and for patient monitoring during the procedure. Interventional neuro radiology nursing staff was also present. FINDINGS: Initial Findings: Right common carotid artery:  Normal course caliber and contour. Right external carotid artery: Patent with antegrade flow. Right internal carotid artery: Initial angiogram after placement of the diagnostic catheter demonstrates some beading distally secondary to vaso spasm, which is non flow limiting. Vertical and petrous segment patent with normal course caliber contour. Cavernous segment patent. Clinoid segment patent. Antegrade flow of the ophthalmic artery. Ophthalmic segment patent. Terminus patent. Right MCA: Occlusion of the right M1 in the mid segment. Leptomeningeal collateral flow is evident across the watershed territory from the right ACA branches. Right ACA: A 1 segment patent. A 2 segment perfuses the right territory. Completion Findings: Right MCA: Complete reperfusion of the right MCA. The distal cortical branches, particularly of the parietal and parietooccipital region demonstrates some slow filling, potentially secondary to distal emboli versus competitive perfusion from leptomeningeal flow. Given the relatively uniform distribution of this slow flow with to and fro contrast, competitive perfusion from the collaterals is favored. Final angiogram of the cervical ICA demonstrates resolving spasm, which is non flow limiting. TICI TICI  2c: Near complete perfusion except for slow flow in a few distal cortical vessels/presence of small distal cortical emboli PROCEDURE: The anesthesia team was present to provide general  endotracheal tube anesthesia and for patient monitoring during the procedure. Intubation was performed in negative pressure [place] in neuro IR holding. Interventional neuro radiology nursing staff was also present. Ultrasound survey of the right inguinal region was performed with images stored and sent to PACs. 11 blade scalpel was used to make a small incision. Blunt dissection was performed with US guidance. A micropuncture needle was used access the right common femoral artery under ultrasound. With excellent arterial blood flow returned, an .018 micro wire was passed through the needle, observed to enter the abdominal aorta under fluoroscopy. The needle was removed, and a micropuncture sheath was placed over the wire. The inner dilator and wire were removed, and an 035 wire was advanced under fluoroscopy into the abdominal aorta. The sheath was removed and a 25cm 41F straight vascular sheath was placed. The dilator was removed and the sheath was flushed. Sheath was attached to pressurized and heparinized saline bag for constant forward flow. A coaxial system was then advanced over the 035 wire. This included a 95cm 087 "Walrus" balloon guide with coaxial 125cm Berenstein diagnostic catheter. This was advanced to the proximal descending thoracic aorta. Wire was then removed. Double flush of the catheter was performed. Catheter was then used to select the innominate artery. Angiogram was performed. Using roadmap technique, the catheter was advanced over a standard glide wire into right cervical ICA, with distal position achieved of the balloon guide. The diagnostic catheter and the wire were removed. Formal angiogram was performed. Road map function was used once the occluded vessel was identified. Copious back flush was performed and  the balloon catheter was attached to heparinized and pressurized saline bag for forward flow. A second coaxial system was then advanced through the balloon catheter, which included the selected intermediate catheter, microcatheter, and microwire. In this scenario, the set up included a 137cm Zoom 71 intermediate catheter, a Trevo Provue18 microcatheter, and 014 synchro soft wire. This system was advanced through the balloon guide catheter under the road-map function, with adequate back-flush at the rotating hemostatic valve at that back end of the balloon guide. Microcatheter and the intermediate catheter system were advanced through the terminal ICA and MCA to the level of the occlusion. The micro wire was then carefully advanced through the occluded segment. Microcatheter was then manipulated up to the occluded segment, with the zoom 71 catheter position in the terminus. This original 150 cm microcatheter would not advance completely through the occluded segment given the length required. As such, the intermediate catheter was advanced into the occluded segment under constant aspiration with the microcatheter and microwire remaining in the selected branch of the MCA. This represented the first pass, direct aspiration through the zoom 71 catheter. We observed the back flow of blood under constant aspiration for about 1 minutes. Upon withdrawal of the intermediate catheter into the straight segment of the MCA there was increased blood flow return and the engine was stopped. Aspiration was performed through the side arm of the hemostatic valve of the intermediate catheter and contrast was gently injected. This confirmed forward flow. Microcatheter and microwire were removed and then a gentle angiogram was performed. Given the less than 50% reperfusion achieved a second pass was required. Alternative coaxial microwire and microcatheter solution was then advanced through the balloon catheter, which now included a 160  cm 027 Marksman catheter, with 014 synchro soft wire. This system was advanced through the intermediate catheter under the road-map function, into the occluded vessel, the angular branch. The micro  wire was then carefully advanced through the occluded segment. Microcatheter was then manipulated through the occlusion into the angular branch. Microwire was gently removed with saline flush at the hub. Blood was then aspirated through the hub of the microcatheter, and a gentle contrast injection was performed confirming intraluminal position. A rotating hemostatic valve was then attached to the back end of the microcatheter, and a pressurized and heparinized saline bag was attached to the catheter. 4 x 40 solitaire device was then selected. Back flush was achieved at the rotating hemostatic valve, and then the device was gently advanced through the microcatheter to the distal end. The retriever was then unsheathed by withdrawing the microcatheter under fluoroscopy. Once the retriever was completely unsheathed, the microcatheter was carefully stripped from the delivery device. Control angiogram was performed from the intermediate catheter through the open solitaire. A 3 minute time interval was observed. We did not inflate the balloon on the balloon guide given the spasm identified previously. Constant aspiration using the proprietary engine was then performed at the intermediate catheter, as the retriever was gently and slowly withdrawn with fluoroscopic observation. Once the retriever was "corked" within the tip of the intermediate catheter, both were removed from the system. Free aspiration was confirmed at the hub of the balloon guide catheter, with free blood return confirmed. The balloon was then deflated, and a control angiogram was performed. Restoration of flow was confirmed. Angiogram of the cervical ICA was performed. Balloon guide was then removed. The skin at the puncture site was then cleaned with  Chlorhexidine. The 8 French sheath was exchanged for a short 8 [place] sheath. 67F Celt device was deployed using [place] guidance. Flat panel CT was performed. Patient remained intubated given the COVID test was outstanding. Patient tolerated the procedure well and remained hemodynamically stable throughout. No complications were encountered and no significant blood loss encountered. IMPRESSION: Status post ultrasound guided access right common femoral artery for right-sided cervical/cerebral angiogram and mechanical thrombectomy of right M1 occlusion with 2 passes, achieving TICI 2c flow, and closure of the access site with Celt device. Signed, Yvone Neu. Reyne Dumas, RPVI [facility] Radiology PLAN: The patient will remain intubated, as he remains COVID status unknown ICU status Target systolic blood pressure of 120-140 The Celt closure device has indication for immediate ambulation, when applicable. Frequent neurovascular checks Repeat neurologic imaging with CT and/MRI at the discretion of neurology team Electronically Signed   By: Gilmer Mor D.O.   On: 05/31/2020 16:12   CT Code Stroke Cerebral Perfusion with contrast  Result Date: 05/31/2020 CLINICAL DATA:  Left-sided weakness, slurred speech EXAM: CT ANGIOGRAPHY HEAD AND NECK CT PERFUSION BRAIN TECHNIQUE: Multidetector CT imaging of the head and neck was performed using the standard protocol during bolus administration of intravenous contrast. Multiplanar CT image reconstructions and MIPs were obtained to evaluate the vascular anatomy. Carotid stenosis measurements (when applicable) are obtained utilizing NASCET criteria, using the distal internal carotid diameter as the denominator. Multiphase CT imaging of the brain was performed following IV bolus contrast injection. Subsequent parametric perfusion maps were calculated using RAPID software. CONTRAST:  OMNIPAQUE IOHEXOL 350 MG/ML SOLN COMPARISON:   05/31/2019 FINDINGS: CTA NECK FINDINGS Aortic arch: Great vessel origins are patent. Right carotid system: Patent.  No measurable stenosis. Left carotid system: Patent. Trace mixed plaque at the ICA origin. No measurable stenosis. Vertebral arteries: Patent. Left vertebral artery is dominant with mild plaque at the origin. No measurable stenosis. Skeleton: Advanced degenerative changes  of the cervical spine primarily from C3-C4 to C5-C6. Other neck: No mass or adenopathy. Upper chest: No apical lung mass. Review of the MIP images confirms the above findings CTA HEAD FINDINGS Anterior circulation: Intracranial internal carotid arteries are patent with minimal calcified plaque. Right M1 MCA is patent to the bifurcation where there is occlusion. Partial reconstitution of M2 MCA and more distal branches. Left middle cerebral and bilateral anterior cerebral arteries are patent. Anterior communicating artery is present. Posterior circulation: Intracranial vertebral arteries, basilar artery, and posterior cerebral arteries are patent. Venous sinuses: As permitted by contrast timing, patent. Review of the MIP images confirms the above findings CT Brain Perfusion Findings: CBF (<30%) Volume: 53mL Perfusion (Tmax>6.0s) volume: Mismatch Volume: 20mL Infarction Location: Right MCA territory IMPRESSION: Right M1 MCA bifurcation occlusion with partial reconstitution of M2 MCA and more distal branches. Perfusion imaging demonstrates 27 mL of core infarction and 90 mL of penumbra within the right MCA territory. No hemodynamically significant stenosis in the neck These results were communicated to Dr. Otelia Limes at 1:39 pm on 05/31/2020 by text page via the [facility] messaging system. Electronically Signed   By: Guadlupe Spanish M.D.   On: 05/31/2020 13:47   DG Chest Port 1 View  Result Date: 05/31/2020 CLINICAL DATA:  Endotracheal tube position. EXAM: PORTABLE CHEST 1 VIEW COMPARISON:  Radiograph yesterday. FINDINGS: Endotracheal  tube tip 5.4 cm from the carina. Increased heart size likely related to technique. Bronchial thickening with question of developing vascular congestion. Streaky retrocardiac atelectasis. No pneumothorax or significant pleural effusion. Stable osseous structures. IMPRESSION: 1. Endotracheal tube tip 5.4 cm from the carina. 2. Bronchial thickening with question of developing vascular congestion. Streaky retrocardiac atelectasis. 3. Increased heart size, likely related to differences in technique. Electronically Signed   By: Narda Rutherford M.D.   On: 05/31/2020 15:59   IR PERCUTANEOUS ART THROMBECTOMY/INFUSION INTRACRANIAL INC DIAG ANGIO  Result Date: 05/31/2020 INDICATION: 59 year old male presents with acute right MCA syndrome, corresponding to right M1 occlusion. He presents for cervical angiogram and attempt at thrombectomy EXAM: ULTRASOUND-GUIDED ACCESS RIGHT COMMON FEMORAL ARTERY RIGHT CERVICAL AND CEREBRAL ANGIOGRAM MECHANICAL THROMBECTOMY OF RIGHT MCA CELT DEVICE FOR HEMOSTASIS COMPARISON:  CT imaging same day, CT imaging 1 year ago MEDICATIONS: None ANESTHESIA/SEDATION: The anesthesia team was present to provide general endotracheal tube anesthesia and for patient monitoring during the procedure. Intubation was performed in negative pressure [place] in neuro IR holding. Left radial arterial line was performed by the anesthesia team. Interventional neuro radiology nursing staff was also present. CONTRAST:  35 cc contrast FLUOROSCOPY TIME:  Fluoroscopy Time: 17 minutes 18 seconds (773 mGy). COMPLICATIONS: None TECHNIQUE: Informed written consent was obtained from the patient after a thorough discussion of the procedural risks, benefits and alternatives. Specific risks discussed include: Bleeding, infection, contrast reaction, kidney injury/failure, need for further procedure/surgery, arterial injury or dissection, embolization to new territory, intracranial hemorrhage (10-15% risk), neurologic deterioration,  cardiopulmonary collapse, death. All questions were addressed. Maximal Sterile Barrier Technique was utilized including during the procedure including caps, mask, sterile gowns, sterile gloves, sterile drape, hand hygiene and skin antiseptic. A timeout was performed prior to the initiation of the procedure. The anesthesia team was present to provide general endotracheal tube anesthesia and for patient monitoring during the procedure. Interventional neuro radiology nursing staff was also present. FINDINGS: Initial Findings: Right common carotid artery:  Normal course caliber and contour. Right external carotid artery: Patent with antegrade flow. Right internal carotid artery: Initial angiogram after placement of the  diagnostic catheter demonstrates some beading distally secondary to vaso spasm, which is non flow limiting. Vertical and petrous segment patent with normal course caliber contour. Cavernous segment patent. Clinoid segment patent. Antegrade flow of the ophthalmic artery. Ophthalmic segment patent. Terminus patent. Right MCA: Occlusion of the right M1 in the mid segment. Leptomeningeal collateral flow is evident across the watershed territory from the right ACA branches. Right ACA: A 1 segment patent. A 2 segment perfuses the right territory. Completion Findings: Right MCA: Complete reperfusion of the right MCA. The distal cortical branches, particularly of the parietal and parietooccipital region demonstrates some slow filling, potentially secondary to distal emboli versus competitive perfusion from leptomeningeal flow. Given the relatively uniform distribution of this slow flow with to and fro contrast, competitive perfusion from the collaterals is favored. Final angiogram of the cervical ICA demonstrates resolving spasm, which is non flow limiting. TICI TICI 2c: Near complete perfusion except for slow flow in a few distal cortical vessels/presence of small distal cortical emboli PROCEDURE: The anesthesia  team was present to provide general endotracheal tube anesthesia and for patient monitoring during the procedure. Intubation was performed in negative pressure [place] in neuro IR holding. Interventional neuro radiology nursing staff was also present. Ultrasound survey of the right inguinal region was performed with images stored and sent to PACs. 11 blade scalpel was used to make a small incision. Blunt dissection was performed with US guidance. A micropuncture needle was used access the right common femoral artery under ultrasound. With excellent arterial blood flow returned, an .018 micro wire was passed through the needle, observed to enter the abdominal aorta under fluoroscopy. The needle was removed, and a micropuncture sheath was placed over the wire. The inner dilator and wire were removed, and an 035 wire was advanced under fluoroscopy into the abdominal aorta. The sheath was removed and a 25cm 11F straight vascular sheath was placed. The dilator was removed and the sheath was flushed. Sheath was attached to pressurized and heparinized saline bag for constant forward flow. A coaxial system was then advanced over the 035 wire. This included a 95cm 087 "Walrus" balloon guide with coaxial 125cm Berenstein diagnostic catheter. This was advanced to the proximal descending thoracic aorta. Wire was then removed. Double flush of the catheter was performed. Catheter was then used to select the innominate artery. Angiogram was performed. Using roadmap technique, the catheter was advanced over a standard glide wire into right cervical ICA, with distal position achieved of the balloon guide. The diagnostic catheter and the wire were removed. Formal angiogram was performed. Road map function was used once the occluded vessel was identified. Copious back flush was performed and the balloon catheter was attached to heparinized and pressurized saline bag for forward flow. A second coaxial system was then advanced through the  balloon catheter, which included the selected intermediate catheter, microcatheter, and microwire. In this scenario, the set up included a 137cm Zoom 71 intermediate catheter, a Trevo Provue18 microcatheter, and 014 synchro soft wire. This system was advanced through the balloon guide catheter under the road-map function, with adequate back-flush at the rotating hemostatic valve at that back end of the balloon guide. Microcatheter and the intermediate catheter system were advanced through the terminal ICA and MCA to the level of the occlusion. The micro wire was then carefully advanced through the occluded segment. Microcatheter was then manipulated up to the occluded segment, with the zoom 71 catheter position in the terminus. This original 150 cm microcatheter would not advance completely  through the occluded segment given the length required. As such, the intermediate catheter was advanced into the occluded segment under constant aspiration with the microcatheter and microwire remaining in the selected branch of the MCA. This represented the first pass, direct aspiration through the zoom 71 catheter. We observed the back flow of blood under constant aspiration for about 1 minutes. Upon withdrawal of the intermediate catheter into the straight segment of the MCA there was increased blood flow return and the engine was stopped. Aspiration was performed through the side arm of the hemostatic valve of the intermediate catheter and contrast was gently injected. This confirmed forward flow. Microcatheter and microwire were removed and then a gentle angiogram was performed. Given the less than 50% reperfusion achieved a second pass was required. Alternative coaxial microwire and microcatheter solution was then advanced through the balloon catheter, which now included a 160 cm 027 Marksman catheter, with 014 synchro soft wire. This system was advanced through the intermediate catheter under the road-map function, into  the occluded vessel, the angular branch. The micro wire was then carefully advanced through the occluded segment. Microcatheter was then manipulated through the occlusion into the angular branch. Microwire was gently removed with saline flush at the hub. Blood was then aspirated through the hub of the microcatheter, and a gentle contrast injection was performed confirming intraluminal position. A rotating hemostatic valve was then attached to the back end of the microcatheter, and a pressurized and heparinized saline bag was attached to the catheter. 4 x 40 solitaire device was then selected. Back flush was achieved at the rotating hemostatic valve, and then the device was gently advanced through the microcatheter to the distal end. The retriever was then unsheathed by withdrawing the microcatheter under fluoroscopy. Once the retriever was completely unsheathed, the microcatheter was carefully stripped from the delivery device. Control angiogram was performed from the intermediate catheter through the open solitaire. A 3 minute time interval was observed. We did not inflate the balloon on the balloon guide given the spasm identified previously. Constant aspiration using the proprietary engine was then performed at the intermediate catheter, as the retriever was gently and slowly withdrawn with fluoroscopic observation. Once the retriever was "corked" within the tip of the intermediate catheter, both were removed from the system. Free aspiration was confirmed at the hub of the balloon guide catheter, with free blood return confirmed. The balloon was then deflated, and a control angiogram was performed. Restoration of flow was confirmed. Angiogram of the cervical ICA was performed. Balloon guide was then removed. The skin at the puncture site was then cleaned with Chlorhexidine. The 8 French sheath was exchanged for a short 8 [place] sheath. 9F Celt device was deployed using [place] guidance. Flat panel CT was performed.  Patient remained intubated given the COVID test was outstanding. Patient tolerated the procedure well and remained hemodynamically stable throughout. No complications were encountered and no significant blood loss encountered. IMPRESSION: Status post ultrasound guided access right common femoral artery for right-sided cervical/cerebral angiogram and mechanical thrombectomy of right M1 occlusion with 2 passes, achieving TICI 2c flow, and closure of the access site with Celt device. Signed, Yvone Neu. Reyne Dumas, RPVI [facility] Radiology PLAN: The patient will remain intubated, as he remains COVID status unknown ICU status Target systolic blood pressure of 120-140 The Celt closure device has indication for immediate ambulation, when applicable. Frequent neurovascular checks Repeat neurologic imaging with CT and/MRI at the discretion of neurology team Electronically Signed  By: Gilmer Mor D.O.   On: 05/31/2020 16:12   CT HEAD CODE STROKE WO CONTRAST  Result Date: 05/31/2020 CLINICAL DATA:  Code stroke.  Left-sided weakness, slurred speech EXAM: CT HEAD WITHOUT CONTRAST TECHNIQUE: Contiguous axial images were obtained from the base of the skull through the vertex without intravenous contrast. COMPARISON:  05/31/2019 FINDINGS: Brain: There is no acute intracranial hemorrhage. New loss of gray-white differentiation in the right MCA territory along the right insula. There is also suspected involvement the inferior parietal cortex. There is no mass effect. Additional patchy hypoattenuation in the supratentorial white matter is nonspecific but probably reflects similar chronic microvascular ischemic changes. Ventricles and sulci are within normal limits in size and configuration. Vascular: Hyperdensity of a proximal right M2 MCA branch. There is intracranial atherosclerotic calcification at the skull base. Skull: Unremarkable. Sinuses/Orbits: Minor mucosal thickening.  Other: Mastoid air cells are clear. ASPECTS [facility] Stroke Program Early CT Score) - Ganglionic level infarction (caudate, lentiform nuclei, internal capsule, insula, M1-M3 cortex): 5 - Supraganglionic infarction (M4-M6 cortex): 3 Total score (0-10 with 10 being normal): 8 IMPRESSION: No acute intracranial hemorrhage. Acute infarction right MCA territory with hyperdense proximal right M2 MCA. ASPECT score is 8. These results were communicated to Dr. Otelia Limes at 1:18 pmon 05/31/2020 by text page via the [facility] messaging system. Electronically Signed   By: Guadlupe Spanish M.D.   On: 05/31/2020 13:30    Labs:  CBC: Recent Labs    05/31/20 1311 05/31/20 1312  WBC  --  6.0  HGB 15.3 14.1  HCT 45.0 44.4  PLT  --  208    COAGS: Recent Labs    05/31/20 1312  INR 1.1  APTT 28    BMP: Recent Labs    05/31/20 1311 05/31/20 1312  NA 140 139  K 4.1 4.1  CL 102 104  CO2  --  26  GLUCOSE 103* 109*  BUN 7 <5*  CALCIUM  --  9.3  CREATININE 0.90 0.96  GFRNONAA  --  >60    LIVER FUNCTION TESTS: Recent Labs    05/31/20 1312  BILITOT 0.6  AST 19  ALT 13  ALKPHOS 80  PROT 7.1  ALBUMIN 3.6    Assessment and Plan:  History of acute CVA s/p cerebral arteriogram with emergent mechanical thrombectomy of right MCA M1 occlusion achieving a TICI 2c revascularization via right femoral approach 05/31/2020 by Dr. Loreta Ave. Patient's condition stable- no neuro deficits noted, can spontaneously move all extremities. Right femoral puncture site stable, distal pulses (DPs) 2+ bilaterally. Further plans per neurology- appreciate and agree with management. Please call NIR with questions/concerns.   Electronically Signed: Elwin Mocha, PA-C 06/01/2020, 9:27 AM   I spent a total of 25 Minutes at the the patient's bedside AND on the patient's hospital floor or unit, greater than 50% of which was counseling/coordinating care for CVA s/p revascularization.

## 2020-06-01 NOTE — Progress Notes (Signed)
STROKE TEAM PROGRESS NOTE   INTERVAL HISTORY No one at the bedside.  Patient in chair. Was able to walk with PT. BP controlled.  Patient wants to go home to get to his money. Will leave AMA if he has to. Per patient he does not have an allergy to ASA, plavix, or atorvastatin. Patient hates medication and was not taking any longer. Maybe discharge later today with 30 day monitor.  Vitals:   06/01/20 0600 06/01/20 0615 06/01/20 0700 06/01/20 0701  BP: 102/69  128/80   Pulse: 64 68 76 71  Resp: 18 17 19 17   Temp:      TempSrc:      SpO2: 98% 97% 99% 99%  Height:       CBC:  Recent Labs  Lab 05/31/20 1311 05/31/20 1312  WBC  --  6.0  NEUTROABS  --  4.6  HGB 15.3 14.1  HCT 45.0 44.4  MCV  --  91.0  PLT  --  208   Basic Metabolic Panel:  Recent Labs  Lab 05/31/20 1311 05/31/20 1312  NA 140 139  K 4.1 4.1  CL 102 104  CO2  --  26  GLUCOSE 103* 109*  BUN 7 <5*  CREATININE 0.90 0.96  CALCIUM  --  9.3   Lipid Panel:  Recent Labs  Lab 06/01/20 0608  CHOL 138  TRIG 55  HDL 43  CHOLHDL 3.2  VLDL 11  LDLCALC 84   HgbA1c:  Recent Labs  Lab 06/01/20 0608  HGBA1C 5.4   Urine Drug Screen:  Recent Labs  Lab 05/31/20 1343  LABOPIA NONE DETECTED  COCAINSCRNUR POSITIVE*  LABBENZ NONE DETECTED  AMPHETMU NONE DETECTED  THCU POSITIVE*  LABBARB NONE DETECTED    IMAGING past 24 hours CT Code Stroke CTA Head W/WO contrast  Result Date: 05/31/2020 CLINICAL DATA:  Left-sided weakness, slurred speech EXAM: CT ANGIOGRAPHY HEAD AND NECK CT PERFUSION BRAIN TECHNIQUE: Multidetector CT imaging of the head and neck was performed using the standard protocol during bolus administration of intravenous contrast. Multiplanar CT image reconstructions and MIPs were obtained to evaluate the vascular anatomy. Carotid stenosis measurements (when applicable) are obtained utilizing NASCET criteria, using the distal internal carotid diameter as the denominator. Multiphase CT imaging of the  brain was performed following IV bolus contrast injection. Subsequent parametric perfusion maps were calculated using RAPID software. CONTRAST:  100mL OMNIPAQUE IOHEXOL 350 MG/ML SOLN COMPARISON:  05/31/2019 FINDINGS: CTA NECK FINDINGS Aortic arch: Great vessel origins are patent. Right carotid system: Patent.  No measurable stenosis. Left carotid system: Patent. Trace mixed plaque at the ICA origin. No measurable stenosis. Vertebral arteries: Patent. Left vertebral artery is dominant with mild plaque at the origin. No measurable stenosis. Skeleton: Advanced degenerative changes of the cervical spine primarily from C3-C4 to C5-C6. Other neck: No mass or adenopathy. Upper chest: No apical lung mass. Review of the MIP images confirms the above findings CTA HEAD FINDINGS Anterior circulation: Intracranial internal carotid arteries are patent with minimal calcified plaque. Right M1 MCA is patent to the bifurcation where there is occlusion. Partial reconstitution of M2 MCA and more distal branches. Left middle cerebral and bilateral anterior cerebral arteries are patent. Anterior communicating artery is present. Posterior circulation: Intracranial vertebral arteries, basilar artery, and posterior cerebral arteries are patent. Venous sinuses: As permitted by contrast timing, patent. Review of the MIP images confirms the above findings CT Brain Perfusion Findings: CBF (<30%) Volume: 27mL Perfusion (Tmax>6.0s) volume: 117mL Mismatch Volume: 90mL Infarction Location:  Right MCA territory IMPRESSION: Right M1 MCA bifurcation occlusion with partial reconstitution of M2 MCA and more distal branches. Perfusion imaging demonstrates 27 mL of core infarction and 90 mL of penumbra within the right MCA territory. No hemodynamically significant stenosis in the neck These results were communicated to Dr. Otelia Limes at 1:39 pm on 05/31/2020 by text page via the [facility] messaging system. Electronically Signed   By: Guadlupe Spanish M.D.   On:  05/31/2020 13:47   CT Code Stroke CTA Neck W/WO contrast  Result Date: 05/31/2020 CLINICAL DATA:  Left-sided weakness, slurred speech EXAM: CT ANGIOGRAPHY HEAD AND NECK CT PERFUSION BRAIN TECHNIQUE: Multidetector CT imaging of the head and neck was performed using the standard protocol during bolus administration of intravenous contrast. Multiplanar CT image reconstructions and MIPs were obtained to evaluate the vascular anatomy. Carotid stenosis measurements (when applicable) are obtained utilizing NASCET criteria, using the distal internal carotid diameter as the denominator. Multiphase CT imaging of the brain was performed following IV bolus contrast injection. Subsequent parametric perfusion maps were calculated using RAPID software. CONTRAST:  OMNIPAQUE IOHEXOL 350 MG/ML SOLN COMPARISON:  05/31/2019 FINDINGS: CTA NECK FINDINGS Aortic arch: Great vessel origins are patent. Right carotid system: Patent.  No measurable stenosis. Left carotid system: Patent. Trace mixed plaque at the ICA origin. No measurable stenosis. Vertebral arteries: Patent. Left vertebral artery is dominant with mild plaque at the origin. No measurable stenosis. Skeleton: Advanced degenerative changes of the cervical spine primarily from C3-C4 to C5-C6. Other neck: No mass or adenopathy. Upper chest: No apical lung mass. Review of the MIP images confirms the above findings CTA HEAD FINDINGS Anterior circulation: Intracranial internal carotid arteries are patent with minimal calcified plaque. Right M1 MCA is patent to the bifurcation where there is occlusion. Partial reconstitution of M2 MCA and more distal branches. Left middle cerebral and bilateral anterior cerebral arteries are patent. Anterior communicating artery is present. Posterior circulation: Intracranial vertebral arteries, basilar artery, and posterior cerebral arteries are patent. Venous sinuses: As permitted by contrast timing, patent. Review of the MIP images  confirms the above findings CT Brain Perfusion Findings: CBF (<30%) Volume: 27mL Perfusion (Tmax>6.0s) volume: Mismatch Volume: 90mL Infarction Location: Right MCA territory IMPRESSION: Right M1 MCA bifurcation occlusion with partial reconstitution of M2 MCA and more distal branches. Perfusion imaging demonstrates 27 mL of core infarction and 90 mL of penumbra within the right MCA territory. No hemodynamically significant stenosis in the neck These results were communicated to Dr. Otelia Limes at 1:39 pm on 05/31/2020 by text page via the [facility] messaging system. Electronically Signed   By: Guadlupe Spanish M.D.   On: 05/31/2020 13:47   MR BRAIN WO CONTRAST  Result Date: 06/01/2020 CLINICAL DATA:  Stroke follow-up EXAM: MRI HEAD WITHOUT CONTRAST TECHNIQUE: Multiplanar, multiecho pulse sequences of the brain and surrounding structures were obtained without intravenous contrast. COMPARISON:  Brain MRI 06/01/2019 CT perfusion scan 05/31/2020 FINDINGS: Brain: Acute cortical infarct of the right insula and superior right temporal lobe. Punctate focus of acute ischemia at the medial right temporal lobe. No acute or chronic hemorrhage. There is multifocal hyperintense T2-weighted signal within the white matter. Parenchymal volume and CSF spaces are normal. The midline structures are normal. Vascular: Major flow voids are preserved. Skull and upper cervical spine: Normal calvarium and skull base. Visualized upper cervical spine and soft tissues are normal. Sinuses/Orbits:No paranasal sinus fluid levels or advanced mucosal thickening. No mastoid or middle ear effusion. Normal orbits. IMPRESSION: 1. Acute cortical infarct of  the right insula and superior right temporal lobe. No hemorrhage or mass effect. 2. Punctate focus of acute ischemia at the medial right temporal lobe. Electronically Signed   By: Deatra Robinson M.D.   On: 06/01/2020 02:26   IR US Guide Vasc Access Right  Result Date: 05/31/2020 INDICATION:  59 year old male presents with acute right MCA syndrome, corresponding to right M1 occlusion. He presents for cervical angiogram and attempt at thrombectomy EXAM: ULTRASOUND-GUIDED ACCESS RIGHT COMMON FEMORAL ARTERY RIGHT CERVICAL AND CEREBRAL ANGIOGRAM MECHANICAL THROMBECTOMY OF RIGHT MCA CELT DEVICE FOR HEMOSTASIS COMPARISON:  CT imaging same day, CT imaging 1 year ago MEDICATIONS: None ANESTHESIA/SEDATION: The anesthesia team was present to provide general endotracheal tube anesthesia and for patient monitoring during the procedure. Intubation was performed in negative pressure [place] in neuro IR holding. Left radial arterial line was performed by the anesthesia team. Interventional neuro radiology nursing staff was also present. CONTRAST:  35 cc contrast FLUOROSCOPY TIME:  Fluoroscopy Time: 17 minutes 18 seconds (773 mGy). COMPLICATIONS: None TECHNIQUE: Informed written consent was obtained from the patient after a thorough discussion of the procedural risks, benefits and alternatives. Specific risks discussed include: Bleeding, infection, contrast reaction, kidney injury/failure, need for further procedure/surgery, arterial injury or dissection, embolization to new territory, intracranial hemorrhage (10-15% risk), neurologic deterioration, cardiopulmonary collapse, death. All questions were addressed. Maximal Sterile Barrier Technique was utilized including during the procedure including caps, mask, sterile gowns, sterile gloves, sterile drape, hand hygiene and skin antiseptic. A timeout was performed prior to the initiation of the procedure. The anesthesia team was present to provide general endotracheal tube anesthesia and for patient monitoring during the procedure. Interventional neuro radiology nursing staff was also present. FINDINGS: Initial Findings: Right common carotid artery:  Normal course caliber and contour. Right external carotid artery: Patent with antegrade flow. Right internal carotid artery:  Initial angiogram after placement of the diagnostic catheter demonstrates some beading distally secondary to vaso spasm, which is non flow limiting. Vertical and petrous segment patent with normal course caliber contour. Cavernous segment patent. Clinoid segment patent. Antegrade flow of the ophthalmic artery. Ophthalmic segment patent. Terminus patent. Right MCA: Occlusion of the right M1 in the mid segment. Leptomeningeal collateral flow is evident across the watershed territory from the right ACA branches. Right ACA: A 1 segment patent. A 2 segment perfuses the right territory. Completion Findings: Right MCA: Complete reperfusion of the right MCA. The distal cortical branches, particularly of the parietal and parietooccipital region demonstrates some slow filling, potentially secondary to distal emboli versus competitive perfusion from leptomeningeal flow. Given the relatively uniform distribution of this slow flow with to and fro contrast, competitive perfusion from the collaterals is favored. Final angiogram of the cervical ICA demonstrates resolving spasm, which is non flow limiting. TICI TICI 2c: Near complete perfusion except for slow flow in a few distal cortical vessels/presence of small distal cortical emboli PROCEDURE: The anesthesia team was present to provide general endotracheal tube anesthesia and for patient monitoring during the procedure. Intubation was performed in negative pressure [place] in neuro IR holding. Interventional neuro radiology nursing staff was also present. Ultrasound survey of the right inguinal region was performed with images stored and sent to PACs. 11 blade scalpel was used to make a small incision. Blunt dissection was performed with US guidance. A micropuncture needle was used access the right common femoral artery under ultrasound. With excellent arterial blood flow returned, an .018 micro wire was passed through the needle, observed to enter the abdominal aorta  under  fluoroscopy. The needle was removed, and a micropuncture sheath was placed over the wire. The inner dilator and wire were removed, and an 035 wire was advanced under fluoroscopy into the abdominal aorta. The sheath was removed and a 25cm 64F straight vascular sheath was placed. The dilator was removed and the sheath was flushed. Sheath was attached to pressurized and heparinized saline bag for constant forward flow. A coaxial system was then advanced over the 035 wire. This included a 95cm 087 "Walrus" balloon guide with coaxial 125cm Berenstein diagnostic catheter. This was advanced to the proximal descending thoracic aorta. Wire was then removed. Double flush of the catheter was performed. Catheter was then used to select the innominate artery. Angiogram was performed. Using roadmap technique, the catheter was advanced over a standard glide wire into right cervical ICA, with distal position achieved of the balloon guide. The diagnostic catheter and the wire were removed. Formal angiogram was performed. Road map function was used once the occluded vessel was identified. Copious back flush was performed and the balloon catheter was attached to heparinized and pressurized saline bag for forward flow. A second coaxial system was then advanced through the balloon catheter, which included the selected intermediate catheter, microcatheter, and microwire. In this scenario, the set up included a 137cm Zoom 71 intermediate catheter, a Trevo Provue18 microcatheter, and 014 synchro soft wire. This system was advanced through the balloon guide catheter under the road-map function, with adequate back-flush at the rotating hemostatic valve at that back end of the balloon guide. Microcatheter and the intermediate catheter system were advanced through the terminal ICA and MCA to the level of the occlusion. The micro wire was then carefully advanced through the occluded segment. Microcatheter was then manipulated up to the occluded  segment, with the zoom 71 catheter position in the terminus. This original 150 cm microcatheter would not advance completely through the occluded segment given the length required. As such, the intermediate catheter was advanced into the occluded segment under constant aspiration with the microcatheter and microwire remaining in the selected branch of the MCA. This represented the first pass, direct aspiration through the zoom 71 catheter. We observed the back flow of blood under constant aspiration for about 1 minutes. Upon withdrawal of the intermediate catheter into the straight segment of the MCA there was increased blood flow return and the engine was stopped. Aspiration was performed through the side arm of the hemostatic valve of the intermediate catheter and contrast was gently injected. This confirmed forward flow. Microcatheter and microwire were removed and then a gentle angiogram was performed. Given the less than 50% reperfusion achieved a second pass was required. Alternative coaxial microwire and microcatheter solution was then advanced through the balloon catheter, which now included a 160 cm 027 Marksman catheter, with 014 synchro soft wire. This system was advanced through the intermediate catheter under the road-map function, into the occluded vessel, the angular branch. The micro wire was then carefully advanced through the occluded segment. Microcatheter was then manipulated through the occlusion into the angular branch. Microwire was gently removed with saline flush at the hub. Blood was then aspirated through the hub of the microcatheter, and a gentle contrast injection was performed confirming intraluminal position. A rotating hemostatic valve was then attached to the back end of the microcatheter, and a pressurized and heparinized saline bag was attached to the catheter. 4 x 40 solitaire device was then selected. Back flush was achieved at the rotating hemostatic valve, and then the  device  was gently advanced through the microcatheter to the distal end. The retriever was then unsheathed by withdrawing the microcatheter under fluoroscopy. Once the retriever was completely unsheathed, the microcatheter was carefully stripped from the delivery device. Control angiogram was performed from the intermediate catheter through the open solitaire. A 3 minute time interval was observed. We did not inflate the balloon on the balloon guide given the spasm identified previously. Constant aspiration using the proprietary engine was then performed at the intermediate catheter, as the retriever was gently and slowly withdrawn with fluoroscopic observation. Once the retriever was "corked" within the tip of the intermediate catheter, both were removed from the system. Free aspiration was confirmed at the hub of the balloon guide catheter, with free blood return confirmed. The balloon was then deflated, and a control angiogram was performed. Restoration of flow was confirmed. Angiogram of the cervical ICA was performed. Balloon guide was then removed. The skin at the puncture site was then cleaned with Chlorhexidine. The 8 French sheath was exchanged for a short 8 [place] sheath. 45F Celt device was deployed using [place] guidance. Flat panel CT was performed. Patient remained intubated given the COVID test was outstanding. Patient tolerated the procedure well and remained hemodynamically stable throughout. No complications were encountered and no significant blood loss encountered. IMPRESSION: Status post ultrasound guided access right common femoral artery for right-sided cervical/cerebral angiogram and mechanical thrombectomy of right M1 occlusion with 2 passes, achieving TICI 2c flow, and closure of the access site with Celt device. Signed, Yvone Neu. Reyne Dumas, RPVI [facility] Radiology PLAN: The patient will remain intubated, as he remains COVID status unknown ICU status  Target systolic blood pressure of 120-140 The Celt closure device has indication for immediate ambulation, when applicable. Frequent neurovascular checks Repeat neurologic imaging with CT and/MRI at the discretion of neurology team Electronically Signed   By: Gilmer Mor D.O.   On: 05/31/2020 16:12   CT Code Stroke Cerebral Perfusion with contrast  Result Date: 05/31/2020 CLINICAL DATA:  Left-sided weakness, slurred speech EXAM: CT ANGIOGRAPHY HEAD AND NECK CT PERFUSION BRAIN TECHNIQUE: Multidetector CT imaging of the head and neck was performed using the standard protocol during bolus administration of intravenous contrast. Multiplanar CT image reconstructions and MIPs were obtained to evaluate the vascular anatomy. Carotid stenosis measurements (when applicable) are obtained utilizing NASCET criteria, using the distal internal carotid diameter as the denominator. Multiphase CT imaging of the brain was performed following IV bolus contrast injection. Subsequent parametric perfusion maps were calculated using RAPID software. CONTRAST:  OMNIPAQUE IOHEXOL 350 MG/ML SOLN COMPARISON:  05/31/2019 FINDINGS: CTA NECK FINDINGS Aortic arch: Great vessel origins are patent. Right carotid system: Patent.  No measurable stenosis. Left carotid system: Patent. Trace mixed plaque at the ICA origin. No measurable stenosis. Vertebral arteries: Patent. Left vertebral artery is dominant with mild plaque at the origin. No measurable stenosis. Skeleton: Advanced degenerative changes of the cervical spine primarily from C3-C4 to C5-C6. Other neck: No mass or adenopathy. Upper chest: No apical lung mass. Review of the MIP images confirms the above findings CTA HEAD FINDINGS Anterior circulation: Intracranial internal carotid arteries are patent with minimal calcified plaque. Right M1 MCA is patent to the bifurcation where there is occlusion. Partial reconstitution of M2 MCA and more distal branches. Left middle cerebral and  bilateral anterior cerebral arteries are patent. Anterior communicating artery is present. Posterior circulation: Intracranial vertebral arteries, basilar artery, and posterior cerebral arteries are patent. Venous  sinuses: As permitted by contrast timing, patent. Review of the MIP images confirms the above findings CT Brain Perfusion Findings: CBF (<30%) Volume: 27mL Perfusion (Tmax>6.0s) volume: Mismatch Volume: 90mL Infarction Location: Right MCA territory IMPRESSION: Right M1 MCA bifurcation occlusion with partial reconstitution of M2 MCA and more distal branches. Perfusion imaging demonstrates 27 mL of core infarction and 90 mL of penumbra within the right MCA territory. No hemodynamically significant stenosis in the neck These results were communicated to Dr. Otelia Limes at 1:39 pm on 05/31/2020 by text page via the [facility] messaging system. Electronically Signed   By: Guadlupe Spanish M.D.   On: 05/31/2020 13:47   DG Chest Port 1 View  Result Date: 05/31/2020 CLINICAL DATA:  Endotracheal tube position. EXAM: PORTABLE CHEST 1 VIEW COMPARISON:  Radiograph yesterday. FINDINGS: Endotracheal tube tip 5.4 cm from the carina. Increased heart size likely related to technique. Bronchial thickening with question of developing vascular congestion. Streaky retrocardiac atelectasis. No pneumothorax or significant pleural effusion. Stable osseous structures. IMPRESSION: 1. Endotracheal tube tip 5.4 cm from the carina. 2. Bronchial thickening with question of developing vascular congestion. Streaky retrocardiac atelectasis. 3. Increased heart size, likely related to differences in technique. Electronically Signed   By: Narda Rutherford M.D.   On: 05/31/2020 15:59   IR PERCUTANEOUS ART THROMBECTOMY/INFUSION INTRACRANIAL INC DIAG ANGIO  Result Date: 05/31/2020 INDICATION: 59 year old male presents with acute right MCA syndrome, corresponding to right M1 occlusion. He presents for cervical angiogram and attempt at  thrombectomy EXAM: ULTRASOUND-GUIDED ACCESS RIGHT COMMON FEMORAL ARTERY RIGHT CERVICAL AND CEREBRAL ANGIOGRAM MECHANICAL THROMBECTOMY OF RIGHT MCA CELT DEVICE FOR HEMOSTASIS COMPARISON:  CT imaging same day, CT imaging 1 year ago MEDICATIONS: None ANESTHESIA/SEDATION: The anesthesia team was present to provide general endotracheal tube anesthesia and for patient monitoring during the procedure. Intubation was performed in negative pressure [place] in neuro IR holding. Left radial arterial line was performed by the anesthesia team. Interventional neuro radiology nursing staff was also present. CONTRAST:  35 cc contrast FLUOROSCOPY TIME:  Fluoroscopy Time: 17 minutes 18 seconds (773 mGy). COMPLICATIONS: None TECHNIQUE: Informed written consent was obtained from the patient after a thorough discussion of the procedural risks, benefits and alternatives. Specific risks discussed include: Bleeding, infection, contrast reaction, kidney injury/failure, need for further procedure/surgery, arterial injury or dissection, embolization to new territory, intracranial hemorrhage (10-15% risk), neurologic deterioration, cardiopulmonary collapse, death. All questions were addressed. Maximal Sterile Barrier Technique was utilized including during the procedure including caps, mask, sterile gowns, sterile gloves, sterile drape, hand hygiene and skin antiseptic. A timeout was performed prior to the initiation of the procedure. The anesthesia team was present to provide general endotracheal tube anesthesia and for patient monitoring during the procedure. Interventional neuro radiology nursing staff was also present. FINDINGS: Initial Findings: Right common carotid artery:  Normal course caliber and contour. Right external carotid artery: Patent with antegrade flow. Right internal carotid artery: Initial angiogram after placement of the diagnostic catheter demonstrates some beading distally secondary to vaso spasm, which is non flow  limiting. Vertical and petrous segment patent with normal course caliber contour. Cavernous segment patent. Clinoid segment patent. Antegrade flow of the ophthalmic artery. Ophthalmic segment patent. Terminus patent. Right MCA: Occlusion of the right M1 in the mid segment. Leptomeningeal collateral flow is evident across the watershed territory from the right ACA branches. Right ACA: A 1 segment patent. A 2 segment perfuses the right territory. Completion Findings: Right MCA: Complete reperfusion of the right MCA. The distal cortical branches, particularly  of the parietal and parietooccipital region demonstrates some slow filling, potentially secondary to distal emboli versus competitive perfusion from leptomeningeal flow. Given the relatively uniform distribution of this slow flow with to and fro contrast, competitive perfusion from the collaterals is favored. Final angiogram of the cervical ICA demonstrates resolving spasm, which is non flow limiting. TICI TICI 2c: Near complete perfusion except for slow flow in a few distal cortical vessels/presence of small distal cortical emboli PROCEDURE: The anesthesia team was present to provide general endotracheal tube anesthesia and for patient monitoring during the procedure. Intubation was performed in negative pressure [place] in neuro IR holding. Interventional neuro radiology nursing staff was also present. Ultrasound survey of the right inguinal region was performed with images stored and sent to PACs. 11 blade scalpel was used to make a small incision. Blunt dissection was performed with US guidance. A micropuncture needle was used access the right common femoral artery under ultrasound. With excellent arterial blood flow returned, an .018 micro wire was passed through the needle, observed to enter the abdominal aorta under fluoroscopy. The needle was removed, and a micropuncture sheath was placed over the wire. The inner dilator and wire were removed, and an 035 wire  was advanced under fluoroscopy into the abdominal aorta. The sheath was removed and a 25cm 76F straight vascular sheath was placed. The dilator was removed and the sheath was flushed. Sheath was attached to pressurized and heparinized saline bag for constant forward flow. A coaxial system was then advanced over the 035 wire. This included a 95cm 087 "Walrus" balloon guide with coaxial 125cm Berenstein diagnostic catheter. This was advanced to the proximal descending thoracic aorta. Wire was then removed. Double flush of the catheter was performed. Catheter was then used to select the innominate artery. Angiogram was performed. Using roadmap technique, the catheter was advanced over a standard glide wire into right cervical ICA, with distal position achieved of the balloon guide. The diagnostic catheter and the wire were removed. Formal angiogram was performed. Road map function was used once the occluded vessel was identified. Copious back flush was performed and the balloon catheter was attached to heparinized and pressurized saline bag for forward flow. A second coaxial system was then advanced through the balloon catheter, which included the selected intermediate catheter, microcatheter, and microwire. In this scenario, the set up included a 137cm Zoom 71 intermediate catheter, a Trevo Provue18 microcatheter, and 014 synchro soft wire. This system was advanced through the balloon guide catheter under the road-map function, with adequate back-flush at the rotating hemostatic valve at that back end of the balloon guide. Microcatheter and the intermediate catheter system were advanced through the terminal ICA and MCA to the level of the occlusion. The micro wire was then carefully advanced through the occluded segment. Microcatheter was then manipulated up to the occluded segment, with the zoom 71 catheter position in the terminus. This original 150 cm microcatheter would not advance completely through the occluded  segment given the length required. As such, the intermediate catheter was advanced into the occluded segment under constant aspiration with the microcatheter and microwire remaining in the selected branch of the MCA. This represented the first pass, direct aspiration through the zoom 71 catheter. We observed the back flow of blood under constant aspiration for about 1 minutes. Upon withdrawal of the intermediate catheter into the straight segment of the MCA there was increased blood flow return and the engine was stopped. Aspiration was performed through the side arm of the hemostatic  valve of the intermediate catheter and contrast was gently injected. This confirmed forward flow. Microcatheter and microwire were removed and then a gentle angiogram was performed. Given the less than 50% reperfusion achieved a second pass was required. Alternative coaxial microwire and microcatheter solution was then advanced through the balloon catheter, which now included a 160 cm 027 Marksman catheter, with 014 synchro soft wire. This system was advanced through the intermediate catheter under the road-map function, into the occluded vessel, the angular branch. The micro wire was then carefully advanced through the occluded segment. Microcatheter was then manipulated through the occlusion into the angular branch. Microwire was gently removed with saline flush at the hub. Blood was then aspirated through the hub of the microcatheter, and a gentle contrast injection was performed confirming intraluminal position. A rotating hemostatic valve was then attached to the back end of the microcatheter, and a pressurized and heparinized saline bag was attached to the catheter. 4 x 40 solitaire device was then selected. Back flush was achieved at the rotating hemostatic valve, and then the device was gently advanced through the microcatheter to the distal end. The retriever was then unsheathed by withdrawing the microcatheter under  fluoroscopy. Once the retriever was completely unsheathed, the microcatheter was carefully stripped from the delivery device. Control angiogram was performed from the intermediate catheter through the open solitaire. A 3 minute time interval was observed. We did not inflate the balloon on the balloon guide given the spasm identified previously. Constant aspiration using the proprietary engine was then performed at the intermediate catheter, as the retriever was gently and slowly withdrawn with fluoroscopic observation. Once the retriever was "corked" within the tip of the intermediate catheter, both were removed from the system. Free aspiration was confirmed at the hub of the balloon guide catheter, with free blood return confirmed. The balloon was then deflated, and a control angiogram was performed. Restoration of flow was confirmed. Angiogram of the cervical ICA was performed. Balloon guide was then removed. The skin at the puncture site was then cleaned with Chlorhexidine. The 8 French sheath was exchanged for a short 8 [place] sheath. 39F Celt device was deployed using [place] guidance. Flat panel CT was performed. Patient remained intubated given the COVID test was outstanding. Patient tolerated the procedure well and remained hemodynamically stable throughout. No complications were encountered and no significant blood loss encountered. IMPRESSION: Status post ultrasound guided access right common femoral artery for right-sided cervical/cerebral angiogram and mechanical thrombectomy of right M1 occlusion with 2 passes, achieving TICI 2c flow, and closure of the access site with Celt device. Signed, Yvone Neu. Reyne Dumas, RPVI [facility] Radiology PLAN: The patient will remain intubated, as he remains COVID status unknown ICU status Target systolic blood pressure of 120-140 The Celt closure device has indication for immediate ambulation, when applicable. Frequent  neurovascular checks Repeat neurologic imaging with CT and/MRI at the discretion of neurology team Electronically Signed   By: Gilmer Mor D.O.   On: 05/31/2020 16:12   CT HEAD CODE STROKE WO CONTRAST  Result Date: 05/31/2020 CLINICAL DATA:  Code stroke.  Left-sided weakness, slurred speech EXAM: CT HEAD WITHOUT CONTRAST TECHNIQUE: Contiguous axial images were obtained from the base of the skull through the vertex without intravenous contrast. COMPARISON:  05/31/2019 FINDINGS: Brain: There is no acute intracranial hemorrhage. New loss of gray-white differentiation in the right MCA territory along the right insula. There is also suspected involvement the inferior parietal cortex. There is no mass  effect. Additional patchy hypoattenuation in the supratentorial white matter is nonspecific but probably reflects similar chronic microvascular ischemic changes. Ventricles and sulci are within normal limits in size and configuration. Vascular: Hyperdensity of a proximal right M2 MCA branch. There is intracranial atherosclerotic calcification at the skull base. Skull: Unremarkable. Sinuses/Orbits: Minor mucosal thickening. Other: Mastoid air cells are clear. ASPECTS [facility] Stroke Program Early CT Score) - Ganglionic level infarction (caudate, lentiform nuclei, internal capsule, insula, M1-M3 cortex): 5 - Supraganglionic infarction (M4-M6 cortex): 3 Total score (0-10 with 10 being normal): 8 IMPRESSION: No acute intracranial hemorrhage. Acute infarction right MCA territory with hyperdense proximal right M2 MCA. ASPECT score is 8. These results were communicated to Dr. Otelia Limes at 1:18 pmon 05/31/2020 by text page via the [facility] messaging system. Electronically Signed   By: Guadlupe Spanish M.D.   On: 05/31/2020 13:30    PHYSICAL EXAM Mental Status: Patient is awake, alert, oriented to person, place, month, year, and situation. Patient is able to give a clear and coherent history. No signs of aphasia or  neglect Cranial Nerves: II: Visual Fields are full. Pupils are equal, round, and reactive to light.   III,IV, VI: EOMI without ptosis or diploplia.  V: Facial sensation is symmetric to LT VII: Facial movement is asymmetric. Slight left facial droop (patient states this is baseline) VIII: hearing is intact to voice X: Uvula elevates symmetrically XI: Shoulder shrug is symmetric. XII: tongue is midline without atrophy or fasciculations.  Motor: Tone is normal. Bulk is normal. 5/5 strength was present in all four extremities.  Sensory: Sensation is symmetric to light touch and temperature in the arms and legs. Plantars: Toes are downgoing bilaterally.  Cerebellar: FNF and HKS are intact bilaterally   ASSESSMENT/PLAN Mark Herrera is a 59 y.o. male with history of  CVA, drug abuse, tobacco abuse, CAD s/p DES presenting with  Acute onset left facial droop, dysarthria and left side weakness.   Stroke:  right  infarct embolic secondary to small vessel disease source  Code Stroke No acute intracranial hemorrhage. Acute infarction right MCA  territory with hyperdense proximal right M2 MCA. ASPECT score is 8.  CTA head/ neck / perfusion : Right M1 MCA bifurcation occlusion with partial reconstitution of M2 MCA and more distal branches. Perfusion imaging demonstrates 27 mL of core infarction and 90 mL of penumbra within the right MCA territory.  MRI  1. Acute cortical infarct of the right insula and superior right temporal lobe. No hemorrhage or mass effect. 2. Punctate focus of acute ischemia at the medial right temporal lobe.  2D Echo pending  LDL 84  HgbA1c 5.4  VTE prophylaxis - SCDs    Diet   Diet heart healthy/carb modified Room service appropriate? Yes; Fluid consistency: Thin     aspirin 81 mg daily and clopidogrel 75 mg daily prior to admission, now on aspirin 81 mg daily and clopidogrel 75 mg daily.   Therapy recommendations:  pending  Disposition:   home Hypertension  Home meds:  none  Stable . Permissive hypertension (OK if < 220/120) but gradually normalize in 5-7 days . Long-term BP goal normotensive  Hyperlipidemia  Home meds:  atorvastatin, resumed in hospital  LDL 84, goal < 70  Continue statin at discharge  Diabetes type II Controlled  Home meds:  none  HgbA1c 5.4, goal < 7.0  CBGs Recent Labs    05/31/20 1305 05/31/20 2141  GLUCAP 104* 124*    Other Stroke Risk Factors  Cigarette  smoker advised to stop smoking  ETOH use, alcohol level <10, advised to drink no more than 2 drink(s) a day  Substance abuse - UDS:  THC POSITIVE, Cocaine POSITIVE. Patient advised to stop using due to stroke risk.  Hx stroke  CAD  30 day heart monitor    Hospital day # 1   Valentina Lucks, MSN, NP-C [facility] [phone]   To contact Stroke Continuity provider, please refer to [email or web address]. After hours, contact General Neurology

## 2020-06-01 NOTE — Progress Notes (Signed)
Occupational Therapy Evaluation Patient Details Name: Mark Herrera MRN: [id number] DOB: Apr 15, 1961 Today's Date: 06/01/2020    History of Present Illness The pt is a 59 yo male presenting with left sided wekaness, left facial droop, and slurred speech. 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 Pt now s/p mechanical thrombectomy. PMH includes: CAD, STEMI 10/2016, current tobacco use.   Clinical Impression   PTA, pt was living with his girlfriend and was independent. Pt currently performing ADLs and functional mobility at Mod I level with increased time. Pt presenting with decreased LUE fine and gross motor coordination. Pt with slight impulsivity and decreased safety and awareness of deficits, but feel this is baseline. Pt eager to dc home this morning, "I hope to leave out of here before 10 am". Answered all questions and provided education in preparation for dc. Recommend dc to home once medically stable per physician.     Follow Up Recommendations  No OT follow up;Supervision - Intermittent    Equipment Recommendations  None recommended by OT    Recommendations for Other Services PT consult     Precautions / Restrictions Precautions Precautions: None (Simultaneous filing. User may not have seen previous data.) Restrictions Weight Bearing Restrictions: No      Mobility Bed Mobility Overal bed mobility: Modified Independent (Simultaneous filing. User may not have seen previous data.)                  Transfers Overall transfer level: Independent  Equipment used: None             General transfer comment: pt able to complete multiple sit-stand transfers from recliner without use of UE or any evidence of instability    Balance Overall balance assessment: Modified Independent                           ADL either performed or assessed with clinical judgement   ADL Overall ADL's : Modified independent                                        General ADL Comments: Pt performing ADLs and functional mobility at Mod I level with increased time. Pt presenting with decreased coorindation at LUE, but able to perform grooming tasks without difficulty.     Vision Baseline Vision/History: No visual deficits Vision Assessment?: No apparent visual deficits     Perception     Praxis      Pertinent Vitals/Pain Pain Assessment: No/denies pain (Simultaneous filing. User may not have seen previous data.)     Hand Dominance Right (Simultaneous filing. User may not have seen previous data.)   Extremity/Trunk Assessment Upper Extremity Assessment Upper Extremity Assessment: Defer to OT evaluation (Simultaneous filing. User may not have seen previous data.) LUE Deficits / Details: Decreased coordination both fine motor and gross motor. ABle to perform finger opposition with increased time. Poor coorindation for finger to nose test; moving slowly and missing therpists finger 25% of time. WFL strength. Performing ADLs without difficulty LUE Coordination: decreased gross motor;decreased fine motor   Lower Extremity Assessment Lower Extremity Assessment: Defer to PT evaluation (Simultaneous filing. User may not have seen previous data.)   Cervical / Trunk Assessment Cervical / Trunk Assessment: Normal (Simultaneous filing. User may not have seen previous data.)   Communication Communication Communication: No  difficulties (Simultaneous filing. User may not have seen previous data.)   Cognition Arousal/Alertness: Awake/alert (Simultaneous filing. User may not have seen previous data.) Behavior During Therapy: Impulsive (Simultaneous filing. User may not have seen previous data.) Overall Cognitive Status: Within Functional Limits for tasks assessed (Simultaneous filing. User may not have seen previous data.)                                 General Comments: Feel pt is close  to baseline cognition. Pt performing simple trail making task and able to recall room number and return to room with supervision. Pt with impulsivity but feel this is baseline (Simultaneous filing. User may not have seen previous data.)   General Comments  VSS on RA (Simultaneous filing. User may not have seen previous data.)    Exercises     Shoulder Instructions      Home Living Family/patient expects to be discharged to:: Other (Comment) (boarding house  Simultaneous filing. User may not have seen previous data.) Living Arrangements: Spouse/significant other (Simultaneous filing. User may not have seen previous data.) Available Help at Discharge: Family;Available 24 hours/day (Simultaneous filing. User may not have seen previous data.) Type of Home: Other(Comment) (boarding house  Simultaneous filing. User may not have seen previous data.) Home Access: Level entry (Simultaneous filing. User may not have seen previous data.)     Home Layout: One level (Simultaneous filing. User may not have seen previous data.)     Bathroom Shower/Tub: Tub/shower unit (Simultaneous filing. User may not have seen previous data.)   Bathroom Toilet: Standard (Simultaneous filing. User may not have seen previous data.)     Home Equipment: None (Simultaneous filing. User may not have seen previous data.)          Prior Functioning/Environment Level of Independence: Independent (Simultaneous filing. 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 User may not have seen previous data.)        OT Problem List: Decreased strength;Decreased activity tolerance;Decreased range of motion;Impaired balance (sitting and/or standing);Decreased knowledge of use of DME or AE;Decreased knowledge of precautions;Impaired UE functional use      OT Treatment/Interventions:      OT Goals(Current goals can  be found in the care plan section) Acute Rehab OT Goals Patient Stated Goal: "Go home because I have a bunch of fmaily coming to see me and I can only have one visitor." (Simultaneous filing. User may not have seen previous data.) OT Goal Formulation: All assessment and education complete, DC therapy  OT Frequency:     Barriers to D/C:            Co-evaluation              AM-PAC OT "6 Clicks" Daily Activity     Outcome Measure Help from another person eating meals?: None Help from another person taking care of personal grooming?: None Help from another person toileting, which includes using toliet, bedpan, or urinal?: None Help from another person bathing (including washing, rinsing, drying)?: None Help from another person to put on and taking off regular upper body clothing?: None Help from another person to put on and taking off regular lower body clothing?: None 6 Click Score: 24   End of Session Nurse Communication: Mobility status  Activity Tolerance: Patient tolerated treatment  well Patient left: in chair;with call bell/phone within reach;Other (comment) (with PT)  OT Visit Diagnosis: Unsteadiness on feet (R26.81);Other abnormalities of gait and mobility (R26.89);Muscle weakness (generalized) (M62.81);Hemiplegia and hemiparesis Hemiplegia - Right/Left: Left Hemiplegia - dominant/non-dominant: Non-Dominant Hemiplegia - caused by: Cerebral infarction                Time: 3545-6256 OT Time Calculation (min): 18 min Charges:  OT General Charges $OT Visit: 1 Visit OT Evaluation $OT Eval Moderate Complexity: 1 Mod  Kyra Laffey MSOT, OTR/L Acute Rehab Pager: [phone] Office: [phone]  Theodoro Grist Markeya Mincy 06/01/2020, 8:33 AM

## 2020-06-01 NOTE — Significant Event (Signed)
PCCM Consulted 12/29 for vent management s/p mechanical thrombectomy. Extubated shortly after. Have spoken to neurology. Plans for D/C this AM. PCCM will sign off at this time.

## 2020-06-01 NOTE — Progress Notes (Signed)
  Echocardiogram 2D Echocardiogram has been performed.  Augustine Radar 06/01/2020, 10:32 AM

## 2020-06-01 NOTE — Progress Notes (Addendum)
Patient is ready to go home. Requested to leave AMA. Dr. Roda Shutters aware and will discharge patient. IVs removed. Patient states he will stay for a little longer for discharge papers. Belongings brought up from IR and given to patient.

## 2020-06-01 NOTE — Discharge Instructions (Signed)
Femoral Site Care This sheet gives you information about how to care for yourself after your procedure. Your health care provider may also give you more specific instructions. If you have problems or questions, contact your health care provider. What can I expect after the procedure? After the procedure, it is common to have:  Bruising that usually fades within 1-2 weeks.  Tenderness at the site. Follow these instructions at home: Wound care 1. Follow instructions from your health care provider about how to take care of your insertion site. Make sure you: ? Wash your hands with soap and water before you change your bandage (dressing). If soap and water are not available, use hand sanitizer. ? Change your dressing as directed- pressure dressing removed 24 hours post-procedure (and switch for bandaid), bandaid removed 72 hours post-procedure 2. Do not take baths, swim, or use a hot tub for 7 days post-procedure. 3. You may shower 48 hours after the procedure or as told by your health care provider. ? Gently wash the site with plain soap and water. ? Pat the area dry with a clean towel. ? Do not rub the site. This may cause bleeding. 4. Check your site every day for signs of infection. Check for: ? Redness, swelling, or pain. ? Fluid or blood. ? Warmth. ? Pus or a bad smell. Activity  Do not stoop, bend, or lift anything that is heavier than 10 lb (4.5 kg) for 2 weeks post-procedure.  Do not drive self for 2 weeks post-procedure. Contact a health care provider if you have:  A fever or chills.  You have redness, swelling, or pain around your insertion site. Get help right away if:  The catheter insertion area swells very fast.  You pass out.  You suddenly start to sweat or your skin gets clammy.  The catheter insertion area is bleeding, and the bleeding does not stop when you hold steady pressure on the area.  The area near or just beyond the catheter insertion site becomes  pale, cool, tingly, or numb. These symptoms may represent a serious problem that is an emergency. Do not wait to see if the symptoms will go away. Get medical help right away. Call your local emergency services (911 in the [place] Do not drive yourself to the hospital.  This information is not intended to replace advice given to you by your health care provider. Make sure you discuss any questions you have with your health care provider. Document Revised: 06/02/2017 Document Reviewed: 06/02/2017 Elsevier Patient Education  2020 [employer] 

## 2020-06-01 NOTE — Evaluation (Signed)
Physical Therapy Evaluation Patient Details Name: Mark Herrera MRN: [id number] DOB: [birth year] Today's Date: 06/01/2020   History of Present Illness  The pt is a 59 yo male presenting with left sided wekaness, left facial droop, and slurred speech. 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 Pt now s/p mechanical thrombectomy. PMH includes: CAD, STEMI 10/2016, current tobacco use.    Clinical Impression  Pt in bed upon arrival of PT, agreeable to evaluation at this time. 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 The pt now presents with minor limitations in functional mobility and stability due to above dx, but is moving at independent level for bed mobility and initial transfers, and supervision level for longer bouts of gait or balance challenge. The pt was able to complete hallway ambulation and wayfinding without evidence of instability or need for assistance, walking with age-appropriate gait speeds, and states his mobility feels to be at baseline. The pt reports no perceivable difference in sensation between L and R extremities, and has no perceivable difference in strength or stability in R and L LE during manual testing. Pt educated in BE FAST, and verbalized understanding of education at this time. The pt is safe to return home with friend/family support as needed with no follow-up therapies. No further acute PT needs, thank you for the consult.      Follow Up Recommendations No PT follow up;Supervision for mobility/OOB    Equipment Recommendations  None recommended by PT    Recommendations for Other Services       Precautions / Restrictions Precautions Precautions: None (Simultaneous filing. User may not have seen previous data.) Restrictions Weight Bearing Restrictions: No      Mobility  Bed Mobility Overal bed mobility: Independent                   Transfers Overall transfer level: Independent Equipment used: None             General transfer comment: pt able to complete multiple sit-stand transfers from recliner without use of UE or any evidence of instability  Ambulation/Gait Ambulation/Gait assistance: Supervision Gait Distance (Feet): 300 Feet Assistive device: None Gait Pattern/deviations: WFL(Within Functional Limits) Gait velocity: 0.8 m/s Gait velocity interpretation: 1.31 - 2.62 ft/sec, indicative of limited community ambulator General Gait Details: supervision for line management at eval, would be safe to mobilize without supervision without lines attached. no evidence of instability with quick turns, stops, or direction changes  Modified Rankin (Stroke Patients Only) Modified Rankin (Stroke Patients Only) Pre-Morbid Rankin Score: No symptoms Modified Rankin: Moderate disability     Balance Overall balance assessment: No apparent balance deficits (not formally assessed)               Single Leg Stance - Right Leg: 10 Single Leg Stance - Left Leg: 20 Tandem Stance - Right Leg: 25 Tandem Stance - Left Leg: 15 Rhomberg - Eyes Opened: 30 Rhomberg - Eyes Closed: 30                 Pertinent Vitals/Pain Pain Assessment: No/denies pain (Simultaneous filing. User may not have seen previous data.)    Home Living Family/patient expects to be discharged to:: Other (Comment) (boarding house  Simultaneous filing. User may not have seen previous data.) Living Arrangements: Spouse/significant other (Simultaneous filing. User may not have seen previous data.) Available Help at Discharge: Family;Available 24  hours/day (Simultaneous filing. User may not have seen previous data.) Type of Home: Other(Comment) (boarding house  Simultaneous filing. User may not have seen previous data.) Home Access: Level entry (Simultaneous filing. User may not have seen previous data.)     Home Layout: One level  (Simultaneous filing. User may not have seen previous data.) Home Equipment: None (Simultaneous filing. User may not have seen previous data.)      Prior Function Level of Independence: Independent (Simultaneous filing. 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 User may not have seen previous data.)     Hand Dominance   Dominant Hand: Right (Simultaneous filing. User may not have seen previous data.)    Extremity/Trunk Assessment   Upper Extremity Assessment Upper Extremity Assessment: Defer to OT evaluation (Simultaneous filing. User may not have seen previous data.) LUE Deficits / Details: Decreased coordination both fine motor and gross motor.    Lower Extremity Assessment Lower Extremity Assessment: Overall WFL for tasks assessed (No discernable difference in strength, sensation, or balance between L and R LE)    Cervical / Trunk Assessment Cervical / Trunk Assessment: Normal  Communication   Communication: No difficulties (Simultaneous filing. User may not have seen previous data.)  Cognition Arousal/Alertness: Awake/alert (Simultaneous filing. User may not have seen previous data.) Behavior During Therapy: Impulsive (Simultaneous filing. User may not have seen previous data.) Overall Cognitive Status: Within Functional Limits for tasks assessed (Simultaneous filing. User may not have seen previous data.)                                 General Comments: Pt quick with movements, but able to demo good awareness of safety with mobility and condition.      General Comments General comments (skin integrity, edema, etc.): Pt pleasant, educated on BE FAST and verbalized understanding        Assessment/Plan    PT Assessment Patent does not need any further PT services         PT Goals (Current goals can be found in the Care Plan section)   Acute Rehab PT Goals Patient Stated Goal: leave hospital ASAP PT Goal Formulation: With patient Time For Goal Achievement: 06/15/20 Potential to Achieve Goals: Good     AM-PAC PT "6 Clicks" Mobility  Outcome Measure Help needed turning from your back to your side while in a flat bed without using bedrails?: None Help needed moving from lying on your back to sitting on the side of a flat bed without using bedrails?: None Help needed moving to and from a bed to a chair (including a wheelchair)?: None Help needed standing up from a chair using your arms (e.g., wheelchair or bedside chair)?: None Help needed to walk in hospital room?: None Help needed climbing 3-5 steps with a railing? : A Little 6 Click Score: 23    End of Session   Activity Tolerance: Patient tolerated treatment well Patient left: in chair;with call bell/phone within reach Nurse Communication: Mobility status PT Visit Diagnosis: Other abnormalities of gait and mobility (R26.89)    Time: 6629-4765 PT Time Calculation (min) (ACUTE ONLY): 17 min   Charges:   PT Evaluation $PT Eval Low Complexity: 1 Low         Rolm Baptise, PT, DPT   Acute Rehabilitation Department Pager #: [phone]   Gaetana Michaelis 06/01/2020, 8:30 AM

## 2020-06-01 NOTE — Discharge Summary (Addendum)
Stroke Discharge Summary  Patient ID: Mark Herrera   MRN: [id number]      DOB: 11-21-1960  Date of Admission: 05/31/2020 Date of Discharge: 06/01/2020  Attending Physician:  Marvel Plan, MD, Stroke MD Consultant(s):    None  Patient's PCP:  Pcp, No  DISCHARGE DIAGNOSIS:    Right MCA infarct due to right M1 occlusion s/p thrombectomy with TICI2c    Cocaine use   Medication noncompliance  Secondary diagnosis   History of stroke s/p tPA   tPA allergey   CAD s/p stent   Smoker   THC user     Allergies as of 06/01/2020   No Active Allergies     Medication List    STOP taking these medications   aspirin 81 MG EC tablet Replaced by: aspirin 81 MG chewable tablet     TAKE these medications   aspirin 81 MG chewable tablet Chew 1 tablet (81 mg total) by mouth daily. Replaces: aspirin 81 MG EC tablet   atorvastatin 80 MG tablet Commonly known as: LIPITOR Take 1 tablet (80 mg total) by mouth daily. What changed:   medication strength  how much to take  when to take this   clopidogrel 75 MG tablet Commonly known as: PLAVIX Take 1 tablet (75 mg total) by mouth daily.       LABORATORY STUDIES CBC    Component Value Date/Time   WBC 6.0 05/31/2020 1312   RBC 4.88 05/31/2020 1312   HGB 14.1 05/31/2020 1312   HCT 44.4 05/31/2020 1312   PLT 208 05/31/2020 1312   MCV 91.0 05/31/2020 1312   MCH 28.9 05/31/2020 1312   MCHC 31.8 05/31/2020 1312   RDW 13.3 05/31/2020 1312   LYMPHSABS 0.8 05/31/2020 1312   MONOABS 0.3 05/31/2020 1312   EOSABS 0.2 05/31/2020 1312   BASOSABS 0.0 05/31/2020 1312   CMP    Component Value Date/Time   NA 139 05/31/2020 1312   K 4.1 05/31/2020 1312   CL 104 05/31/2020 1312   CO2 26 05/31/2020 1312   GLUCOSE 109 (H) 05/31/2020 1312   BUN <5 (L) 05/31/2020 1312   CREATININE 0.96 05/31/2020 1312   CALCIUM 9.3 05/31/2020 1312   PROT 7.1 05/31/2020 1312   ALBUMIN 3.6 05/31/2020 1312   AST 19 05/31/2020 1312   ALT 13  05/31/2020 1312   ALKPHOS 80 05/31/2020 1312   BILITOT 0.6 05/31/2020 1312   GFRNONAA >60 05/31/2020 1312   GFRAA >60 06/01/2019 0553   COAGS Lab Results  Component Value Date   INR 1.1 05/31/2020   INR 1.1 05/31/2019   INR RESULTS UNAVAILABLE DUE TO INTERFERING SUBSTANCE 05/31/2019   Lipid Panel    Component Value Date/Time   CHOL 138 06/01/2020 0608   TRIG 55 06/01/2020 0608   HDL 43 06/01/2020 0608   CHOLHDL 3.2 06/01/2020 0608   VLDL 11 06/01/2020 0608   LDLCALC 84 06/01/2020 0608   HgbA1C  Lab Results  Component Value Date   HGBA1C 5.4 06/01/2020   Urinalysis    Component Value Date/Time   COLORURINE STRAW (A) 05/31/2020 1417   APPEARANCEUR CLEAR 05/31/2020 1417   LABSPEC 1.006 05/31/2020 1417   PHURINE 7.0 05/31/2020 1417   GLUCOSEU NEGATIVE 05/31/2020 1417   HGBUR SMALL (A) 05/31/2020 1417   BILIRUBINUR NEGATIVE 05/31/2020 1417   KETONESUR NEGATIVE 05/31/2020 1417   PROTEINUR NEGATIVE 05/31/2020 1417   NITRITE NEGATIVE 05/31/2020 1417   LEUKOCYTESUR NEGATIVE 05/31/2020 1417   Urine  Drug Screen     Component Value Date/Time   LABOPIA NONE DETECTED 05/31/2020 1343   COCAINSCRNUR POSITIVE (A) 05/31/2020 1343   LABBENZ NONE DETECTED 05/31/2020 1343   AMPHETMU NONE DETECTED 05/31/2020 1343   THCU POSITIVE (A) 05/31/2020 1343   LABBARB NONE DETECTED 05/31/2020 1343    Alcohol Level    Component Value Date/Time   ETH <10 05/31/2019 0835     SIGNIFICANT DIAGNOSTIC STUDIES CT Code Stroke CTA Head W/WO contrast  Result Date: 05/31/2020 CLINICAL DATA:  Left-sided weakness, slurred speech EXAM: CT ANGIOGRAPHY HEAD AND NECK CT PERFUSION BRAIN TECHNIQUE: Multidetector CT imaging of the head and neck was performed using the standard protocol during bolus administration of intravenous contrast. Multiplanar CT image reconstructions and MIPs were obtained to evaluate the vascular anatomy. Carotid stenosis measurements (when applicable) are obtained utilizing  NASCET criteria, using the distal internal carotid diameter as the denominator. Multiphase CT imaging of the brain was performed following IV bolus contrast injection. Subsequent parametric perfusion maps were calculated using RAPID software. CONTRAST:  OMNIPAQUE IOHEXOL 350 MG/ML SOLN COMPARISON:  05/31/2019 FINDINGS: CTA NECK FINDINGS Aortic arch: Great vessel origins are patent. Right carotid system: Patent.  No measurable stenosis. Left carotid system: Patent. Trace mixed plaque at the ICA origin. No measurable stenosis. Vertebral arteries: Patent. Left vertebral artery is dominant with mild plaque at the origin. No measurable stenosis. Skeleton: Advanced degenerative changes of the cervical spine primarily from C3-C4 to C5-C6. Other neck: No mass or adenopathy. Upper chest: No apical lung mass. Review of the MIP images confirms the above findings CTA HEAD FINDINGS Anterior circulation: Intracranial internal carotid arteries are patent with minimal calcified plaque. Right M1 MCA is patent to the bifurcation where there is occlusion. Partial reconstitution of M2 MCA and more distal branches. Left middle cerebral and bilateral anterior cerebral arteries are patent. Anterior communicating artery is present. Posterior circulation: Intracranial vertebral arteries, basilar artery, and posterior cerebral arteries are patent. Venous sinuses: As permitted by contrast timing, patent. Review of the MIP images confirms the above findings CT Brain Perfusion Findings: CBF (<30%) Volume: 41mL Perfusion (Tmax>6.0s) volume: Mismatch Volume: 25mL Infarction Location: Right MCA territory IMPRESSION: Right M1 MCA bifurcation occlusion with partial reconstitution of M2 MCA and more distal branches. Perfusion imaging demonstrates 27 mL of core infarction and 90 mL of penumbra within the right MCA territory. No hemodynamically significant stenosis in the neck These results were communicated to Dr. Otelia Limes at 1:39 pm on  05/31/2020 by text page via the [facility] messaging system. Electronically Signed   By: Guadlupe Spanish M.D.   On: 05/31/2020 13:47   CT Code Stroke CTA Neck W/WO contrast  Result Date: 05/31/2020 CLINICAL DATA:  Left-sided weakness, slurred speech EXAM: CT ANGIOGRAPHY HEAD AND NECK CT PERFUSION BRAIN TECHNIQUE: Multidetector CT imaging of the head and neck was performed using the standard protocol during bolus administration of intravenous contrast. Multiplanar CT image reconstructions and MIPs were obtained to evaluate the vascular anatomy. Carotid stenosis measurements (when applicable) are obtained utilizing NASCET criteria, using the distal internal carotid diameter as the denominator. Multiphase CT imaging of the brain was performed following IV bolus contrast injection. Subsequent parametric perfusion maps were calculated using RAPID software. CONTRAST:  OMNIPAQUE IOHEXOL 350 MG/ML SOLN COMPARISON:  05/31/2019 FINDINGS: CTA NECK FINDINGS Aortic arch: Great vessel origins are patent. Right carotid system: Patent.  No measurable stenosis. Left carotid system: Patent. Trace mixed plaque at the ICA origin. No measurable stenosis. Vertebral arteries:  Patent. Left vertebral artery is dominant with mild plaque at the origin. No measurable stenosis. Skeleton: Advanced degenerative changes of the cervical spine primarily from C3-C4 to C5-C6. Other neck: No mass or adenopathy. Upper chest: No apical lung mass. Review of the MIP images confirms the above findings CTA HEAD FINDINGS Anterior circulation: Intracranial internal carotid arteries are patent with minimal calcified plaque. Right M1 MCA is patent to the bifurcation where there is occlusion. Partial reconstitution of M2 MCA and more distal branches. Left middle cerebral and bilateral anterior cerebral arteries are patent. Anterior communicating artery is present. Posterior circulation: Intracranial vertebral arteries, basilar artery, and posterior cerebral  arteries are patent. Venous sinuses: As permitted by contrast timing, patent. Review of the MIP images confirms the above findings CT Brain Perfusion Findings: CBF (<30%) Volume: 27mL Perfusion (Tmax>6.0s) volume: Mismatch Volume: 90mL Infarction Location: Right MCA territory IMPRESSION: Right M1 MCA bifurcation occlusion with partial reconstitution of M2 MCA and more distal branches. Perfusion imaging demonstrates 27 mL of core infarction and 90 mL of penumbra within the right MCA territory. No hemodynamically significant stenosis in the neck These results were communicated to Dr. Otelia Limes at 1:39 pm on 05/31/2020 by text page via the [facility] messaging system. Electronically Signed   By: Guadlupe Spanish M.D.   On: 05/31/2020 13:47   MR BRAIN WO CONTRAST  Result Date: 06/01/2020 CLINICAL DATA:  Stroke follow-up EXAM: MRI HEAD WITHOUT CONTRAST TECHNIQUE: Multiplanar, multiecho pulse sequences of the brain and surrounding structures were obtained without intravenous contrast. COMPARISON:  Brain MRI 06/01/2019 CT perfusion scan 05/31/2020 FINDINGS: Brain: Acute cortical infarct of the right insula and superior right temporal lobe. Punctate focus of acute ischemia at the medial right temporal lobe. No acute or chronic hemorrhage. There is multifocal hyperintense T2-weighted signal within the white matter. Parenchymal volume and CSF spaces are normal. The midline structures are normal. Vascular: Major flow voids are preserved. Skull and upper cervical spine: Normal calvarium and skull base. Visualized upper cervical spine and soft tissues are normal. Sinuses/Orbits:No paranasal sinus fluid levels or advanced mucosal thickening. No mastoid or middle ear effusion. Normal orbits. IMPRESSION: 1. Acute cortical infarct of the right insula and superior right temporal lobe. No hemorrhage or mass effect. 2. Punctate focus of acute ischemia at the medial right temporal lobe. Electronically Signed   By: Deatra Robinson M.D.    On: 06/01/2020 02:26   IR US Guide Vasc Access Right  Result Date: 05/31/2020 INDICATION: 59 year old male presents with acute right MCA syndrome, corresponding to right M1 occlusion. He presents for cervical angiogram and attempt at thrombectomy EXAM: ULTRASOUND-GUIDED ACCESS RIGHT COMMON FEMORAL ARTERY RIGHT CERVICAL AND CEREBRAL ANGIOGRAM MECHANICAL THROMBECTOMY OF RIGHT MCA CELT DEVICE FOR HEMOSTASIS COMPARISON:  CT imaging same day, CT imaging 1 year ago MEDICATIONS: None ANESTHESIA/SEDATION: The anesthesia team was present to provide general endotracheal tube anesthesia and for patient monitoring during the procedure. Intubation was performed in negative pressure [place] in neuro IR holding. Left radial arterial line was performed by the anesthesia team. Interventional neuro radiology nursing staff was also present. CONTRAST:  35 cc contrast FLUOROSCOPY TIME:  Fluoroscopy Time: 17 minutes 18 seconds (773 mGy). COMPLICATIONS: None TECHNIQUE: Informed written consent was obtained from the patient after a thorough discussion of the procedural risks, benefits and alternatives. Specific risks discussed include: Bleeding, infection, contrast reaction, kidney injury/failure, need for further procedure/surgery, arterial injury or dissection, embolization to new territory, intracranial hemorrhage (10-15% risk), neurologic deterioration, cardiopulmonary collapse, death. All questions were  addressed. Maximal Sterile Barrier Technique was utilized including during the procedure including caps, mask, sterile gowns, sterile gloves, sterile drape, hand hygiene and skin antiseptic. A timeout was performed prior to the initiation of the procedure. The anesthesia team was present to provide general endotracheal tube anesthesia and for patient monitoring during the procedure. Interventional neuro radiology nursing staff was also present. FINDINGS: Initial Findings: Right common carotid artery:  Normal course caliber and  contour. Right external carotid artery: Patent with antegrade flow. Right internal carotid artery: Initial angiogram after placement of the diagnostic catheter demonstrates some beading distally secondary to vaso spasm, which is non flow limiting. Vertical and petrous segment patent with normal course caliber contour. Cavernous segment patent. Clinoid segment patent. Antegrade flow of the ophthalmic artery. Ophthalmic segment patent. Terminus patent. Right MCA: Occlusion of the right M1 in the mid segment. Leptomeningeal collateral flow is evident across the watershed territory from the right ACA branches. Right ACA: A 1 segment patent. A 2 segment perfuses the right territory. Completion Findings: Right MCA: Complete reperfusion of the right MCA. The distal cortical branches, particularly of the parietal and parietooccipital region demonstrates some slow filling, potentially secondary to distal emboli versus competitive perfusion from leptomeningeal flow. Given the relatively uniform distribution of this slow flow with to and fro contrast, competitive perfusion from the collaterals is favored. Final angiogram of the cervical ICA demonstrates resolving spasm, which is non flow limiting. TICI TICI 2c: Near complete perfusion except for slow flow in a few distal cortical vessels/presence of small distal cortical emboli PROCEDURE: The anesthesia team was present to provide general endotracheal tube anesthesia and for patient monitoring during the procedure. Intubation was performed in negative pressure [place] in neuro IR holding. Interventional neuro radiology nursing staff was also present. Ultrasound survey of the right inguinal region was performed with images stored and sent to PACs. 11 blade scalpel was used to make a small incision. Blunt dissection was performed with US guidance. A micropuncture needle was used access the right common femoral artery under ultrasound. With excellent arterial blood flow returned, an  .018 micro wire was passed through the needle, observed to enter the abdominal aorta under fluoroscopy. The needle was removed, and a micropuncture sheath was placed over the wire. The inner dilator and wire were removed, and an 035 wire was advanced under fluoroscopy into the abdominal aorta. The sheath was removed and a 25cm 22F straight vascular sheath was placed. The dilator was removed and the sheath was flushed. Sheath was attached to pressurized and heparinized saline bag for constant forward flow. A coaxial system was then advanced over the 035 wire. This included a 95cm 087 "Walrus" balloon guide with coaxial 125cm Berenstein diagnostic catheter. This was advanced to the proximal descending thoracic aorta. Wire was then removed. Double flush of the catheter was performed. Catheter was then used to select the innominate artery. Angiogram was performed. Using roadmap technique, the catheter was advanced over a standard glide wire into right cervical ICA, with distal position achieved of the balloon guide. The diagnostic catheter and the wire were removed. Formal angiogram was performed. Road map function was used once the occluded vessel was identified. Copious back flush was performed and the balloon catheter was attached to heparinized and pressurized saline bag for forward flow. A second coaxial system was then advanced through the balloon catheter, which included the selected intermediate catheter, microcatheter, and microwire. In this scenario, the set up included a 137cm Zoom 71 intermediate catheter, a Trevo Provue18  microcatheter, and 014 synchro soft wire. This system was advanced through the balloon guide catheter under the road-map function, with adequate back-flush at the rotating hemostatic valve at that back end of the balloon guide. Microcatheter and the intermediate catheter system were advanced through the terminal ICA and MCA to the level of the occlusion. The micro wire was then carefully  advanced through the occluded segment. Microcatheter was then manipulated up to the occluded segment, with the zoom 71 catheter position in the terminus. This original 150 cm microcatheter would not advance completely through the occluded segment given the length required. As such, the intermediate catheter was advanced into the occluded segment under constant aspiration with the microcatheter and microwire remaining in the selected branch of the MCA. This represented the first pass, direct aspiration through the zoom 71 catheter. We observed the back flow of blood under constant aspiration for about 1 minutes. Upon withdrawal of the intermediate catheter into the straight segment of the MCA there was increased blood flow return and the engine was stopped. Aspiration was performed through the side arm of the hemostatic valve of the intermediate catheter and contrast was gently injected. This confirmed forward flow. Microcatheter and microwire were removed and then a gentle angiogram was performed. Given the less than 50% reperfusion achieved a second pass was required. Alternative coaxial microwire and microcatheter solution was then advanced through the balloon catheter, which now included a 160 cm 027 Marksman catheter, with 014 synchro soft wire. This system was advanced through the intermediate catheter under the road-map function, into the occluded vessel, the angular branch. The micro wire was then carefully advanced through the occluded segment. Microcatheter was then manipulated through the occlusion into the angular branch. Microwire was gently removed with saline flush at the hub. Blood was then aspirated through the hub of the microcatheter, and a gentle contrast injection was performed confirming intraluminal position. A rotating hemostatic valve was then attached to the back end of the microcatheter, and a pressurized and heparinized saline bag was attached to the catheter. 4 x 40 solitaire device was  then selected. Back flush was achieved at the rotating hemostatic valve, and then the device was gently advanced through the microcatheter to the distal end. The retriever was then unsheathed by withdrawing the microcatheter under fluoroscopy. Once the retriever was completely unsheathed, the microcatheter was carefully stripped from the delivery device. Control angiogram was performed from the intermediate catheter through the open solitaire. A 3 minute time interval was observed. We did not inflate the balloon on the balloon guide given the spasm identified previously. Constant aspiration using the proprietary engine was then performed at the intermediate catheter, as the retriever was gently and slowly withdrawn with fluoroscopic observation. Once the retriever was "corked" within the tip of the intermediate catheter, both were removed from the system. Free aspiration was confirmed at the hub of the balloon guide catheter, with free blood return confirmed. The balloon was then deflated, and a control angiogram was performed. Restoration of flow was confirmed. Angiogram of the cervical ICA was performed. Balloon guide was then removed. The skin at the puncture site was then cleaned with Chlorhexidine. The 8 French sheath was exchanged for a short 8 [place] sheath. 18F Celt device was deployed using [place] guidance. Flat panel CT was performed. Patient remained intubated given the COVID test was outstanding. Patient tolerated the procedure well and remained hemodynamically stable throughout. No complications were encountered and no significant blood loss encountered. IMPRESSION: Status post ultrasound guided  access right common femoral artery for right-sided cervical/cerebral angiogram and mechanical thrombectomy of right M1 occlusion with 2 passes, achieving TICI 2c flow, and closure of the access site with Celt device. Signed, Yvone Neu. Reyne Dumas, RPVI [facility]  Radiology PLAN: The patient will remain intubated, as he remains COVID status unknown ICU status Target systolic blood pressure of 120-140 The Celt closure device has indication for immediate ambulation, when applicable. Frequent neurovascular checks Repeat neurologic imaging with CT and/MRI at the discretion of neurology team Electronically Signed   By: Gilmer Mor D.O.   On: 05/31/2020 16:12   CT Code Stroke Cerebral Perfusion with contrast  Result Date: 05/31/2020 CLINICAL DATA:  Left-sided weakness, slurred speech EXAM: CT ANGIOGRAPHY HEAD AND NECK CT PERFUSION BRAIN TECHNIQUE: Multidetector CT imaging of the head and neck was performed using the standard protocol during bolus administration of intravenous contrast. Multiplanar CT image reconstructions and MIPs were obtained to evaluate the vascular anatomy. Carotid stenosis measurements (when applicable) are obtained utilizing NASCET criteria, using the distal internal carotid diameter as the denominator. Multiphase CT imaging of the brain was performed following IV bolus contrast injection. Subsequent parametric perfusion maps were calculated using RAPID software. CONTRAST:  OMNIPAQUE IOHEXOL 350 MG/ML SOLN COMPARISON:  05/31/2019 FINDINGS: CTA NECK FINDINGS Aortic arch: Great vessel origins are patent. Right carotid system: Patent.  No measurable stenosis. Left carotid system: Patent. Trace mixed plaque at the ICA origin. No measurable stenosis. Vertebral arteries: Patent. Left vertebral artery is dominant with mild plaque at the origin. No measurable stenosis. Skeleton: Advanced degenerative changes of the cervical spine primarily from C3-C4 to C5-C6. Other neck: No mass or adenopathy. Upper chest: No apical lung mass. Review of the MIP images confirms the above findings CTA HEAD FINDINGS Anterior circulation: Intracranial internal carotid arteries are patent with minimal calcified plaque. Right M1 MCA is patent to the bifurcation where there is  occlusion. Partial reconstitution of M2 MCA and more distal branches. Left middle cerebral and bilateral anterior cerebral arteries are patent. Anterior communicating artery is present. Posterior circulation: Intracranial vertebral arteries, basilar artery, and posterior cerebral arteries are patent. Venous sinuses: As permitted by contrast timing, patent. Review of the MIP images confirms the above findings CT Brain Perfusion Findings: CBF (<30%) Volume: 27mL Perfusion (Tmax>6.0s) volume: Mismatch Volume: 90mL Infarction Location: Right MCA territory IMPRESSION: Right M1 MCA bifurcation occlusion with partial reconstitution of M2 MCA and more distal branches. Perfusion imaging demonstrates 27 mL of core infarction and 90 mL of penumbra within the right MCA territory. No hemodynamically significant stenosis in the neck These results were communicated to Dr. Otelia Limes at 1:39 pm on 05/31/2020 by text page via the [facility] messaging system. Electronically Signed   By: Guadlupe Spanish M.D.   On: 05/31/2020 13:47   DG Chest Port 1 View  Result Date: 05/31/2020 CLINICAL DATA:  Endotracheal tube position. EXAM: PORTABLE CHEST 1 VIEW COMPARISON:  Radiograph yesterday. FINDINGS: Endotracheal tube tip 5.4 cm from the carina. Increased heart size likely related to technique. Bronchial thickening with question of developing vascular congestion. Streaky retrocardiac atelectasis. No pneumothorax or significant pleural effusion. Stable osseous structures. IMPRESSION: 1. Endotracheal tube tip 5.4 cm from the carina. 2. Bronchial thickening with question of developing vascular congestion. Streaky retrocardiac atelectasis. 3. Increased heart size, likely related to differences in technique. Electronically Signed   By: Narda Rutherford M.D.   On: 05/31/2020 15:59   IR PERCUTANEOUS ART THROMBECTOMY/INFUSION INTRACRANIAL INC DIAG ANGIO  Result Date: 05/31/2020 INDICATION: 59 year old male presents with acute right MCA  syndrome, corresponding to right M1 occlusion. He presents for cervical angiogram and attempt at thrombectomy EXAM: ULTRASOUND-GUIDED ACCESS RIGHT COMMON FEMORAL ARTERY RIGHT CERVICAL AND CEREBRAL ANGIOGRAM MECHANICAL THROMBECTOMY OF RIGHT MCA CELT DEVICE FOR HEMOSTASIS COMPARISON:  CT imaging same day, CT imaging 1 year ago MEDICATIONS: None ANESTHESIA/SEDATION: The anesthesia team was present to provide general endotracheal tube anesthesia and for patient monitoring during the procedure. Intubation was performed in negative pressure [place] in neuro IR holding. Left radial arterial line was performed by the anesthesia team. Interventional neuro radiology nursing staff was also present. CONTRAST:  35 cc contrast FLUOROSCOPY TIME:  Fluoroscopy Time: 17 minutes 18 seconds (773 mGy). COMPLICATIONS: None TECHNIQUE: Informed written consent was obtained from the patient after a thorough discussion of the procedural risks, benefits and alternatives. Specific risks discussed include: Bleeding, infection, contrast reaction, kidney injury/failure, need for further procedure/surgery, arterial injury or dissection, embolization to new territory, intracranial hemorrhage (10-15% risk), neurologic deterioration, cardiopulmonary collapse, death. All questions were addressed. Maximal Sterile Barrier Technique was utilized including during the procedure including caps, mask, sterile gowns, sterile gloves, sterile drape, hand hygiene and skin antiseptic. A timeout was performed prior to the initiation of the procedure. The anesthesia team was present to provide general endotracheal tube anesthesia and for patient monitoring during the procedure. Interventional neuro radiology nursing staff was also present. FINDINGS: Initial Findings: Right common carotid artery:  Normal course caliber and contour. Right external carotid artery: Patent with antegrade flow. Right internal carotid artery: Initial angiogram after placement of the  diagnostic catheter demonstrates some beading distally secondary to vaso spasm, which is non flow limiting. Vertical and petrous segment patent with normal course caliber contour. Cavernous segment patent. Clinoid segment patent. Antegrade flow of the ophthalmic artery. Ophthalmic segment patent. Terminus patent. Right MCA: Occlusion of the right M1 in the mid segment. Leptomeningeal collateral flow is evident across the watershed territory from the right ACA branches. Right ACA: A 1 segment patent. A 2 segment perfuses the right territory. Completion Findings: Right MCA: Complete reperfusion of the right MCA. The distal cortical branches, particularly of the parietal and parietooccipital region demonstrates some slow filling, potentially secondary to distal emboli versus competitive perfusion from leptomeningeal flow. Given the relatively uniform distribution of this slow flow with to and fro contrast, competitive perfusion from the collaterals is favored. Final angiogram of the cervical ICA demonstrates resolving spasm, which is non flow limiting. TICI TICI 2c: Near complete perfusion except for slow flow in a few distal cortical vessels/presence of small distal cortical emboli PROCEDURE: The anesthesia team was present to provide general endotracheal tube anesthesia and for patient monitoring during the procedure. Intubation was performed in negative pressure [place] in neuro IR holding. Interventional neuro radiology nursing staff was also present. Ultrasound survey of the right inguinal region was performed with images stored and sent to PACs. 11 blade scalpel was used to make a small incision. Blunt dissection was performed with US guidance. A micropuncture needle was used access the right common femoral artery under ultrasound. With excellent arterial blood flow returned, an .018 micro wire was passed through the needle, observed to enter the abdominal aorta under fluoroscopy. The needle was removed, and a  micropuncture sheath was placed over the wire. The inner dilator and wire were removed, and an 035 wire was advanced under fluoroscopy into the abdominal aorta. The sheath was removed and a 25cm 102F straight vascular sheath was placed.  The dilator was removed and the sheath was flushed. Sheath was attached to pressurized and heparinized saline bag for constant forward flow. A coaxial system was then advanced over the 035 wire. This included a 95cm 087 "Walrus" balloon guide with coaxial 125cm Berenstein diagnostic catheter. This was advanced to the proximal descending thoracic aorta. Wire was then removed. Double flush of the catheter was performed. Catheter was then used to select the innominate artery. Angiogram was performed. Using roadmap technique, the catheter was advanced over a standard glide wire into right cervical ICA, with distal position achieved of the balloon guide. The diagnostic catheter and the wire were removed. Formal angiogram was performed. Road map function was used once the occluded vessel was identified. Copious back flush was performed and the balloon catheter was attached to heparinized and pressurized saline bag for forward flow. A second coaxial system was then advanced through the balloon catheter, which included the selected intermediate catheter, microcatheter, and microwire. In this scenario, the set up included a 137cm Zoom 71 intermediate catheter, a Trevo Provue18 microcatheter, and 014 synchro soft wire. This system was advanced through the balloon guide catheter under the road-map function, with adequate back-flush at the rotating hemostatic valve at that back end of the balloon guide. Microcatheter and the intermediate catheter system were advanced through the terminal ICA and MCA to the level of the occlusion. The micro wire was then carefully advanced through the occluded segment. Microcatheter was then manipulated up to the occluded segment, with the zoom 71 catheter position  in the terminus. This original 150 cm microcatheter would not advance completely through the occluded segment given the length required. As such, the intermediate catheter was advanced into the occluded segment under constant aspiration with the microcatheter and microwire remaining in the selected branch of the MCA. This represented the first pass, direct aspiration through the zoom 71 catheter. We observed the back flow of blood under constant aspiration for about 1 minutes. Upon withdrawal of the intermediate catheter into the straight segment of the MCA there was increased blood flow return and the engine was stopped. Aspiration was performed through the side arm of the hemostatic valve of the intermediate catheter and contrast was gently injected. This confirmed forward flow. Microcatheter and microwire were removed and then a gentle angiogram was performed. Given the less than 50% reperfusion achieved a second pass was required. Alternative coaxial microwire and microcatheter solution was then advanced through the balloon catheter, which now included a 160 cm 027 Marksman catheter, with 014 synchro soft wire. This system was advanced through the intermediate catheter under the road-map function, into the occluded vessel, the angular branch. The micro wire was then carefully advanced through the occluded segment. Microcatheter was then manipulated through the occlusion into the angular branch. Microwire was gently removed with saline flush at the hub. Blood was then aspirated through the hub of the microcatheter, and a gentle contrast injection was performed confirming intraluminal position. A rotating hemostatic valve was then attached to the back end of the microcatheter, and a pressurized and heparinized saline bag was attached to the catheter. 4 x 40 solitaire device was then selected. Back flush was achieved at the rotating hemostatic valve, and then the device was gently advanced through the microcatheter  to the distal end. The retriever was then unsheathed by withdrawing the microcatheter under fluoroscopy. Once the retriever was completely unsheathed, the microcatheter was carefully stripped from the delivery device. Control angiogram was performed from the intermediate catheter through the  open solitaire. A 3 minute time interval was observed. We did not inflate the balloon on the balloon guide given the spasm identified previously. Constant aspiration using the proprietary engine was then performed at the intermediate catheter, as the retriever was gently and slowly withdrawn with fluoroscopic observation. Once the retriever was "corked" within the tip of the intermediate catheter, both were removed from the system. Free aspiration was confirmed at the hub of the balloon guide catheter, with free blood return confirmed. The balloon was then deflated, and a control angiogram was performed. Restoration of flow was confirmed. Angiogram of the cervical ICA was performed. Balloon guide was then removed. The skin at the puncture site was then cleaned with Chlorhexidine. The 8 French sheath was exchanged for a short 8 [place] sheath. 64F Celt device was deployed using [place] guidance. Flat panel CT was performed. Patient remained intubated given the COVID test was outstanding. Patient tolerated the procedure well and remained hemodynamically stable throughout. No complications were encountered and no significant blood loss encountered. IMPRESSION: Status post ultrasound guided access right common femoral artery for right-sided cervical/cerebral angiogram and mechanical thrombectomy of right M1 occlusion with 2 passes, achieving TICI 2c flow, and closure of the access site with Celt device. Signed, Yvone Neu. Reyne Dumas, RPVI [facility] Radiology PLAN: The patient will remain intubated, as he remains COVID status unknown ICU status Target systolic blood pressure of 120-140 The  Celt closure device has indication for immediate ambulation, when applicable. Frequent neurovascular checks Repeat neurologic imaging with CT and/MRI at the discretion of neurology team Electronically Signed   By: Gilmer Mor D.O.   On: 05/31/2020 16:12   CT HEAD CODE STROKE WO CONTRAST  Result Date: 05/31/2020 CLINICAL DATA:  Code stroke.  Left-sided weakness, slurred speech EXAM: CT HEAD WITHOUT CONTRAST TECHNIQUE: Contiguous axial images were obtained from the base of the skull through the vertex without intravenous contrast. COMPARISON:  05/31/2019 FINDINGS: Brain: There is no acute intracranial hemorrhage. New loss of gray-white differentiation in the right MCA territory along the right insula. There is also suspected involvement the inferior parietal cortex. There is no mass effect. Additional patchy hypoattenuation in the supratentorial white matter is nonspecific but probably reflects similar chronic microvascular ischemic changes. Ventricles and sulci are within normal limits in size and configuration. Vascular: Hyperdensity of a proximal right M2 MCA branch. There is intracranial atherosclerotic calcification at the skull base. Skull: Unremarkable. Sinuses/Orbits: Minor mucosal thickening. Other: Mastoid air cells are clear. ASPECTS [facility] Stroke Program Early CT Score) - Ganglionic level infarction (caudate, lentiform nuclei, internal capsule, insula, M1-M3 cortex): 5 - Supraganglionic infarction (M4-M6 cortex): 3 Total score (0-10 with 10 being normal): 8 IMPRESSION: No acute intracranial hemorrhage. Acute infarction right MCA territory with hyperdense proximal right M2 MCA. ASPECT score is 8. These results were communicated to Dr. Otelia Limes at 1:18 pmon 05/31/2020 by text page via the [facility] messaging system. Electronically Signed   By: Guadlupe Spanish M.D.   On: 05/31/2020 13:30      HISTORY OF PRESENT ILLNESS 59 y.o. male with a PMHx of stroke a year ago yesterday which was treated with tPA  followed by adverse effect of angioedema, CAD s/p DES, gout, tobacco abuse who presents acutely as a Code Stroke via EMS for acute onset of left facial droop, slurred speech and left sided weakness. LKN was 1045 which was the time of symptom onset.   He states that he smoked marijuana this AM which was  laced with cocaine powder. He states that he was partying. When removing one of his socks during exam a small pea-sized white lump wrapped in plastic wrap, bearing a resemblance to hardened sugar, fell out of his sock. It was taken into custody for further analysis by one of the RNs responding to the Code Stroke.   Medications list in Epic consists of ASA, Plavix and atorvastatin. However, the patient states he is not taking any medications. He also has an allergy to atorvastatin + Plavix listed.   HOSPITAL COURSE 59 year old male with history of CAD status post stent, smoker, noncompliance, cocaine abuse, history of stroke presented to ED for left facial droop, slurred speech and left-sided weakness.  No tPA given TPA allergy in the past.  Status post thrombectomy.  Stroke:  right  MCA infarct due to right M1 occlusion s/p IR with TICI2c, embolic pattern secondary to cocaine use and medication noncompliance  Code Stroke  Acute infarction right MCA  territory with hyperdense proximal right M2 MCA. ASPECT score is 8.  CTA head/ neck / perfusion : Right M1 MCA bifurcation occlusion with partial reconstitution of M2 MCA and more distal branches.   CTP 90 mL of penumbra within the right MCA territory.  MRI - Acute cortical infarct of the right insula and superior right temporal lobe.  2D Echo  EF 25 to 30%, LA moderately dilated  Recommend 30-day Cardic event monitoring as outpatient to rule out A. fib  LDL 84  HgbA1c 5.4  VTE prophylaxis - SCDs  None prior to admission, now on aspirin 81 mg daily and clopidogrel 75 mg daily.  Recommend DAPT for 3 weeks and then aspirin alone. However, per  RN, pt stated that he would not take the medication at home  Therapy recommendations:  home  Disposition:  home  History of stroke/TIA  05/2019 admitted for right arm leg weakness after intercourse with his girlfriend.  Status post TPA.  CT negative, CT head and neck no LVO.  MRI negative.  LDL 99, A1c 5.4.  EF 40 to 45%.  UDS showed cocaine positive.  Discharged on DAPT and Lipitor 40.  He is not compliant with medications outpatient  Cardiomyopathy  05/2019 EF 40 to 45%  This admission EF 25 to 30%  Likely due to chronic cocaine use  Recommend 30-day Cardic event monitoring as outpatient to rule out A. fib  Recommend outpatient cardiology follow-up - referral placed  Hypertension  Home meds:  none  Stable  Long-term BP goal normotensive  Hyperlipidemia  Home meds:  None  LDL 84, goal < 70  Put on Lipitor 80  Continue statin at discharge  Cocaine use  Chronic cocaine user  UDS positive for cocaine again this admission  Cocaine cessation education provided  Patient willing to quit  Tobacco abuse  Current smoker  Smoking cessation counseling provided  Pt is willing to quit  Other Stroke Risk Factors  ETOH use, alcohol level <10, advised to drink no more than 2 drink(s) a day  THC use - UDS THC POSITIVE. Patient advised to stop using due to stroke risk.  CAD septal stenting   DISCHARGE EXAM Blood pressure 128/80, pulse 67, temperature 98.9 F (37.2 C), temperature source Oral, resp. rate (!) 24, height 5' 8.5" (1.74 m), SpO2 97 %.   Constitutional: Appears well-developed and well-nourished.  Psych: Affect appropriate to situation Eyes: No scleral injection HENT: No OP obstrucion MSK: no joint deformities.  Cardiovascular: Normal rate and regular rhythm.  Respiratory: Effort normal, non-labored breathing GI: Soft.  No distension. There is no tenderness.   Mental Status: Patient is awake, alert, oriented to person, place, month, year,  and situation. Patient is able to give a clear and coherent history. No signs of aphasia or neglect Cranial Nerves: II: Visual Fields are full. Pupils are equal, round, and reactive to light.   III,IV, VI: EOMI without ptosis or diploplia.  V: Facial sensation is symmetric to LT VII: Facial movement is asymmetric. Slight left facial droop (patient states this is baseline) VIII: hearing is intact to voice X: Uvula elevates symmetrically XI: Shoulder shrug is symmetric. XII: tongue is midline without atrophy or fasciculations.  Motor: Tone is normal. Bulk is normal. 5/5 strength was present in all four extremities.  Sensory: Sensation is symmetric to light touch and temperature in the arms and legs. Plantars: Toes are downgoing bilaterally.  Cerebellar: FNF and HKS are intact bilaterally   Discharge Diet       Diet   Diet heart healthy/carb modified Room service appropriate? Yes; Fluid consistency: Thin   liquids  DISCHARGE PLAN  Disposition:  home  aspirin 81 mg daily and clopidogrel 75 mg daily for secondary stroke prevention.  Ongoing stroke risk factor control by Primary Care Physician at time of discharge  Follow-up PCP Pcp, No in 2 weeks.  Follow-up in [facility] in 4 weeks, office to schedule an appointment.   35 minutes were spent preparing discharge.  Valentina Lucks, MSN, NP-C [facility] [phone]  ATTENDING NOTE: I reviewed above note and agree with the assessment and plan. Pt was seen and examined.   Pt sitting in bed, asking to go home.  Neurologically intact, apparently he had a good outcome from thrombectomy.  MRI still showing left MCA infarct.  EF 25 to 30%, worsened from 1 year ago.  Recommend outpatient cardiology follow-up.  Recommend 30-day cardiac event monitoring to rule out A. fib as outpatient.  Recommend to continue DAPT and Lipitor.  However, per RN, he refused to take medication at home  after discharge.  Has put referral orders to cardiology and neurology for follow-up.  Marvel Plan, MD PhD Stroke Neurology 06/01/2020 7:53 PM

## 2020-06-02 NOTE — Anesthesia Postprocedure Evaluation (Signed)
Anesthesia Post Note  Patient: Mark Herrera  Procedure(s) Performed: IR WITH ANESTHESIA (N/A )     Patient location during evaluation: SICU Anesthesia Type: General Level of consciousness: sedated Pain management: pain level controlled Vital Signs Assessment: post-procedure vital signs reviewed and stable Respiratory status: patient remains intubated per anesthesia plan Cardiovascular status: stable Postop Assessment: no apparent nausea or vomiting Anesthetic complications: no   No complications documented.  Last Vitals:  Vitals:   06/01/20 1000 06/01/20 1045  BP: 115/68   Pulse: (!) 48 61  Resp: 16 20  Temp:    SpO2: 99% 99%    Last Pain:  Vitals:   06/01/20 0800  TempSrc: Oral  PainSc:    Pain Goal:                   Kennieth Rad

## 2020-06-21 ENCOUNTER — Encounter: Payer: Self-pay | Admitting: General Practice

## 2020-09-01 ENCOUNTER — Other Ambulatory Visit: Payer: Self-pay

## 2020-09-01 NOTE — Patient Outreach (Signed)
[facility]) Care Management  09/01/2020  Mark Herrera April 15, 1961 [id number]  First telephone outreach attempt to obtain mRS. No answer. Left message for returned call.  [name] [facility] Management Assistant [phone]

## 2020-09-12 ENCOUNTER — Other Ambulatory Visit: Payer: Self-pay

## 2020-09-12 NOTE — Patient Outreach (Signed)
[facility]) Care Management  09/12/2020  Mark Herrera [birth year] [id number]   Second telephone outreach attempt to obtain mRS. No answer. Unable to leave a message for returned call.  [name] [facility] Management Assistant [phone]

## 2020-09-14 ENCOUNTER — Other Ambulatory Visit: Payer: Self-pay

## 2020-09-14 NOTE — Patient Outreach (Signed)
[facility]) Care Management  09/14/2020  Mark Herrera 11-Oct-1960 [id number]   3 outreach attempts were completed to obtain mRs. mRs could not be obtained because patient never returned my calls. mRs=7    [name] Care Management Assistant [phone]

## 2021-07-04 ENCOUNTER — Ambulatory Visit (HOSPITAL_COMMUNITY)
Admission: EM | Admit: 2021-07-04 | Discharge: 2021-07-04 | Disposition: A | Discharge status: Home / Self Care | Payer: Self-pay | Attending: Family Medicine | Admitting: Family Medicine

## 2021-07-04 ENCOUNTER — Encounter (HOSPITAL_COMMUNITY): Payer: Self-pay

## 2021-07-04 DIAGNOSIS — H1033 Unspecified acute conjunctivitis, bilateral: Secondary | ICD-10-CM

## 2021-07-04 MED ORDER — OFLOXACIN 0.3 % OP SOLN: OPHTHALMIC | 0 refills | Status: DC

## 2021-07-04 MED ORDER — OLOPATADINE HCL 0.2 % OP SOLN: 1.0000 [drp] | Freq: Every day | OPHTHALMIC | 0 refills | Status: DC

## 2021-07-04 MED ORDER — POLYMYXIN B-TRIMETHOPRIM 10000-0.1 UNIT/ML-% OP SOLN: OPHTHALMIC | 0 refills | Status: DC

## 2021-07-04 NOTE — ED Triage Notes (Signed)
Pt presents with c/o bilateral eye pain x 4 days.   States he has redness, puffiness, drainage.

## 2021-07-05 NOTE — ED Provider Notes (Signed)
[facility] CARE CENTER   [id number] 07/04/21 Arrival Time: 1704 ASSESSMENT & PLAN:  1. Acute bacterial conjunctivitis of both eyes    Begin: Meds ordered this encounter  Medications    (REPORTS TOO EXPENSIVE)      Olopatadine HCl 0.2 % SOLN    Sig: Apply 1 drop to eye daily.    Dispense:  2.5 mL    Refill:  0   trimethoprim-polymyxin b (POLYTRIM) ophthalmic solution    Sig: 1-2 gtts in both eyes QID for 5-7 days    Dispense:  10 mL    Refill:  0    Discussed the diagnosis and proper care of conjunctivitis.  Stressed household Presenter, broadcasting. Ophthalmic drops per orders. Warm compress to eye(s). Local eye care discussed. Analgesics as needed.  Reviewed expectations re: course of current medical issues. Questions answered. Outlined signs and symptoms indicating need for more acute intervention. Patient verbalized understanding. After Visit Summary given.   SUBJECTIVE:  Mark Herrera is a 61 y.o. male who presents with complaint of persistent bilat eye redness/irritation/watery drainage. Onset gradual, approximately 4 days ago. No specific eye pain reported. Injury: no. Visual changes: no. Contact lens use: no. Recent illness: no. Self treatment: none reported.  OBJECTIVE:  Vitals:   07/04/21 1830  BP: 128/63  Pulse: 77  Resp: 18  SpO2: 100%    General appearance: alert; no distress HEENT: [place]; AT; PERRLA; no restriction of the extraocular movements OU: without reported pain; with 2+ conjunctival injection; with watery and some slightly opaque drainage; without gross corneal opacities; without limbal flush; without periorbital swelling or erythema Neck: supple without LAD Skin: warm and dry Psychological: alert and cooperative; normal mood and affect  No Known Allergies  Past Medical History:  Diagnosis Date   CAD (coronary artery disease)    10/08/16 STEMI DES x1 to pLAD EF 45%   Cocaine abuse (HCC)    CVA (cerebral vascular accident) (HCC)    Gout     Tobacco abuse    Social History   Socioeconomic History   Marital status: Single    Spouse name: Not on file   Number of children: Not on file   Years of education: Not on file   Highest education level: Not on file  Occupational History   Not on file  Tobacco Use   Smoking status: Every Day    Packs/day: 1.00    Types: Cigarettes   Smokeless tobacco: Never  Substance and Sexual Activity   Alcohol use: Yes    Comment: occ   Drug use: Yes    Types: Marijuana   Sexual activity: Not on file  Other Topics Concern   Not on file  Social History Narrative   Not on file   Social Determinants of Health   Financial Resource Strain: Not on file  Food Insecurity: Not on file  Transportation Needs: Not on file  Physical Activity: Not on file  Stress: Not on file  Social Connections: Not on file  Intimate Partner Violence: Not on file   Family History  Problem Relation Age of Onset   Hypertension Father    Past Surgical History:  Procedure Laterality Date   CORONARY STENT INTERVENTION N/A 10/08/2016   Procedure: Coronary Stent Intervention;  Surgeon: Kathleene Hazel, MD;  Location: [facility] INVASIVE CV LAB;  Service: Cardiovascular;  Laterality: N/A;   IR CT HEAD LTD  05/31/2020   IR PERCUTANEOUS ART THROMBECTOMY/INFUSION INTRACRANIAL INC DIAG ANGIO  05/31/2020   IR US GUIDE  VASC ACCESS RIGHT  05/31/2020   LEFT HEART CATH AND CORONARY ANGIOGRAPHY N/A 10/08/2016   Procedure: Left Heart Cath and Coronary Angiography;  Surgeon: Kathleene Hazel, MD;  Location: [facility] INVASIVE CV LAB;  Service: Cardiovascular;  Laterality: N/A;   RADIOLOGY WITH ANESTHESIA N/A 05/31/2020   Procedure: IR WITH ANESTHESIA;  Surgeon: Radiologist, Medication, MD;  Location: [facility] OR;  Service: Radiology;  Laterality: N/A;   TOE SURGERY        Mardella Layman, MD 07/05/21 [phone]

## 2021-09-05 ENCOUNTER — Encounter (HOSPITAL_COMMUNITY): Payer: Self-pay | Admitting: Anesthesiology

## 2021-09-05 ENCOUNTER — Emergency Department (HOSPITAL_COMMUNITY): Payer: Self-pay

## 2021-09-05 ENCOUNTER — Inpatient Hospital Stay (HOSPITAL_COMMUNITY)
Admission: EM | Admit: 2021-09-05 | Discharge: 2021-09-09 | DRG: 062 | Disposition: A | Discharge status: Rehabilitation Facility | Payer: Self-pay | Attending: Neurology | Admitting: Neurology

## 2021-09-05 ENCOUNTER — Encounter (HOSPITAL_COMMUNITY)
Admission: EM | Disposition: A | Discharge status: Rehabilitation Facility | Payer: Self-pay | Source: Home / Self Care | Attending: Neurology

## 2021-09-05 ENCOUNTER — Encounter (HOSPITAL_COMMUNITY): Payer: Self-pay | Admitting: Certified Registered"

## 2021-09-05 ENCOUNTER — Other Ambulatory Visit (HOSPITAL_COMMUNITY): Payer: Self-pay

## 2021-09-05 DIAGNOSIS — Z955 Presence of coronary angioplasty implant and graft: Secondary | ICD-10-CM

## 2021-09-05 DIAGNOSIS — I63511 Cerebral infarction due to unspecified occlusion or stenosis of right middle cerebral artery: Principal | ICD-10-CM | POA: Diagnosis present

## 2021-09-05 DIAGNOSIS — Z79899 Other long term (current) drug therapy: Secondary | ICD-10-CM

## 2021-09-05 DIAGNOSIS — R4587 Impulsiveness: Secondary | ICD-10-CM | POA: Diagnosis present

## 2021-09-05 DIAGNOSIS — F17210 Nicotine dependence, cigarettes, uncomplicated: Secondary | ICD-10-CM | POA: Diagnosis present

## 2021-09-05 DIAGNOSIS — F1290 Cannabis use, unspecified, uncomplicated: Secondary | ICD-10-CM | POA: Diagnosis present

## 2021-09-05 DIAGNOSIS — G8194 Hemiplegia, unspecified affecting left nondominant side: Secondary | ICD-10-CM | POA: Diagnosis present

## 2021-09-05 DIAGNOSIS — K148 Other diseases of tongue: Secondary | ICD-10-CM | POA: Diagnosis present

## 2021-09-05 DIAGNOSIS — F4321 Adjustment disorder with depressed mood: Secondary | ICD-10-CM | POA: Diagnosis present

## 2021-09-05 DIAGNOSIS — H5340 Unspecified visual field defects: Secondary | ICD-10-CM | POA: Diagnosis present

## 2021-09-05 DIAGNOSIS — Z7902 Long term (current) use of antithrombotics/antiplatelets: Secondary | ICD-10-CM

## 2021-09-05 DIAGNOSIS — E785 Hyperlipidemia, unspecified: Secondary | ICD-10-CM | POA: Diagnosis present

## 2021-09-05 DIAGNOSIS — R262 Difficulty in walking, not elsewhere classified: Secondary | ICD-10-CM | POA: Diagnosis present

## 2021-09-05 DIAGNOSIS — F1410 Cocaine abuse, uncomplicated: Secondary | ICD-10-CM | POA: Diagnosis present

## 2021-09-05 DIAGNOSIS — G9389 Other specified disorders of brain: Secondary | ICD-10-CM | POA: Diagnosis present

## 2021-09-05 DIAGNOSIS — Z8673 Personal history of transient ischemic attack (TIA), and cerebral infarction without residual deficits: Secondary | ICD-10-CM

## 2021-09-05 DIAGNOSIS — R471 Dysarthria and anarthria: Secondary | ICD-10-CM | POA: Diagnosis present

## 2021-09-05 DIAGNOSIS — E8809 Other disorders of plasma-protein metabolism, not elsewhere classified: Secondary | ICD-10-CM | POA: Diagnosis present

## 2021-09-05 DIAGNOSIS — Z7982 Long term (current) use of aspirin: Secondary | ICD-10-CM

## 2021-09-05 DIAGNOSIS — I252 Old myocardial infarction: Secondary | ICD-10-CM

## 2021-09-05 DIAGNOSIS — R29810 Facial weakness: Secondary | ICD-10-CM | POA: Diagnosis present

## 2021-09-05 DIAGNOSIS — I10 Essential (primary) hypertension: Secondary | ICD-10-CM | POA: Diagnosis present

## 2021-09-05 DIAGNOSIS — I2510 Atherosclerotic heart disease of native coronary artery without angina pectoris: Secondary | ICD-10-CM | POA: Diagnosis present

## 2021-09-05 DIAGNOSIS — R29712 NIHSS score 12: Secondary | ICD-10-CM | POA: Diagnosis present

## 2021-09-05 DIAGNOSIS — I639 Cerebral infarction, unspecified: Principal | ICD-10-CM

## 2021-09-05 DIAGNOSIS — I63311 Cerebral infarction due to thrombosis of right middle cerebral artery: Secondary | ICD-10-CM

## 2021-09-05 DIAGNOSIS — R739 Hyperglycemia, unspecified: Secondary | ICD-10-CM | POA: Diagnosis present

## 2021-09-05 DIAGNOSIS — Z8249 Family history of ischemic heart disease and other diseases of the circulatory system: Secondary | ICD-10-CM

## 2021-09-05 DIAGNOSIS — R4781 Slurred speech: Secondary | ICD-10-CM | POA: Diagnosis present

## 2021-09-05 HISTORY — PX: RADIOLOGY WITH ANESTHESIA: SHX6223

## 2021-09-05 LAB — CBC
HCT: 46.2 % (ref 39.0–52.0)
Hemoglobin: 15.1 g/dL (ref 13.0–17.0)
MCH: 29.7 pg (ref 26.0–34.0)
MCHC: 32.7 g/dL (ref 30.0–36.0)
MCV: 90.8 fL (ref 80.0–100.0)
Platelets: 195 10*3/uL (ref 150–400)
RBC: 5.09 MIL/uL (ref 4.22–5.81)
RDW: 13.8 % (ref 11.5–15.5)
WBC: 5.7 10*3/uL (ref 4.0–10.5)
nRBC: 0 % (ref 0.0–0.2)

## 2021-09-05 LAB — COMPREHENSIVE METABOLIC PANEL
ALT: 15 U/L (ref 0–44)
AST: 22 U/L (ref 15–41)
Albumin: 3.1 g/dL — ABNORMAL LOW (ref 3.5–5.0)
Alkaline Phosphatase: 66 U/L (ref 38–126)
Anion gap: 7 (ref 5–15)
BUN: 7 mg/dL (ref 6–20)
CO2: 22 mmol/L (ref 22–32)
Calcium: 8 mg/dL — ABNORMAL LOW (ref 8.9–10.3)
Chloride: 107 mmol/L (ref 98–111)
Creatinine, Ser: 1.07 mg/dL (ref 0.61–1.24)
GFR, Estimated: >60 mL/min (ref >60)
Glucose, Bld: 111 mg/dL — ABNORMAL HIGH (ref 70–99)
Potassium: 4.1 mmol/L (ref 3.5–5.1)
Sodium: 136 mmol/L (ref 135–145)
Total Bilirubin: 0.4 mg/dL (ref 0.3–1.2)
Total Protein: 5.3 g/dL — ABNORMAL LOW (ref 6.5–8.1)

## 2021-09-05 LAB — I-STAT CHEM 8, ED
BUN: 8 mg/dL (ref 6–20)
Calcium, Ion: 0.96 mmol/L — ABNORMAL LOW (ref 1.15–1.40)
Chloride: 106 mmol/L (ref 98–111)
Creatinine, Ser: 1 mg/dL (ref 0.61–1.24)
Glucose, Bld: 109 mg/dL — ABNORMAL HIGH (ref 70–99)
HCT: 46 % (ref 39.0–52.0)
Hemoglobin: 15.6 g/dL (ref 13.0–17.0)
Potassium: 4 mmol/L (ref 3.5–5.1)
Sodium: 137 mmol/L (ref 135–145)
TCO2: 23 mmol/L (ref 22–32)

## 2021-09-05 LAB — DIFFERENTIAL
Abs Immature Granulocytes: 0.02 10*3/uL (ref 0.00–0.07)
Basophils Absolute: 0 10*3/uL (ref 0.0–0.1)
Basophils Relative: 1 %
Eosinophils Absolute: 0.4 10*3/uL (ref 0.0–0.5)
Eosinophils Relative: 8 %
Immature Granulocytes: 0 %
Lymphocytes Relative: 30 %
Lymphs Abs: 1.7 10*3/uL (ref 0.7–4.0)
Monocytes Absolute: 0.5 10*3/uL (ref 0.1–1.0)
Monocytes Relative: 9 %
Neutro Abs: 3 10*3/uL (ref 1.7–7.7)
Neutrophils Relative %: 52 %

## 2021-09-05 LAB — PROTIME-INR
INR: 1.1 (ref 0.8–1.2)
Prothrombin Time: 13.7 seconds (ref 11.4–15.2)

## 2021-09-05 LAB — APTT: aPTT: 27 seconds (ref 24–36)

## 2021-09-05 LAB — CBG MONITORING, ED: Glucose-Capillary: 106 mg/dL — ABNORMAL HIGH (ref 70–99)

## 2021-09-05 SURGERY — RADIOLOGY WITH ANESTHESIA
Anesthesia: Choice

## 2021-09-05 MED ORDER — SODIUM CHLORIDE 0.9% FLUSH: 3.0000 mL | Freq: Once | INTRAVENOUS | Status: DC

## 2021-09-05 MED ORDER — ACETAMINOPHEN 650 MG RE SUPP: 650.0000 mg | RECTAL | Status: DC | PRN

## 2021-09-05 MED ORDER — CLEVIDIPINE BUTYRATE 0.5 MG/ML IV EMUL: 0.0000 mg/h | INTRAVENOUS | Status: DC

## 2021-09-05 MED ORDER — SENNOSIDES-DOCUSATE SODIUM 8.6-50 MG PO TABS: 1.0000 | ORAL_TABLET | Freq: Every evening | ORAL | Status: DC | PRN

## 2021-09-05 MED ORDER — SODIUM CHLORIDE 0.9 % IV SOLN: INTRAVENOUS | Status: AC

## 2021-09-05 MED ORDER — ACETAMINOPHEN 325 MG PO TABS
650.0000 mg | ORAL_TABLET | ORAL | Status: DC | PRN
  Administered 2021-09-07 – 2021-09-08 (×2): 650 mg via ORAL
  Filled 2021-09-05 (×2): qty 2

## 2021-09-05 MED ORDER — TENECTEPLASE FOR STROKE
0.2500 mg/kg | PACK | Freq: Once | INTRAVENOUS | Status: AC
  Administered 2021-09-05: 19 mg via INTRAVENOUS

## 2021-09-05 MED ORDER — ACETAMINOPHEN 160 MG/5ML PO SOLN: 650.0000 mg | ORAL | Status: DC | PRN

## 2021-09-05 MED ORDER — IOHEXOL 350 MG/ML SOLN
75.0000 mL | Freq: Once | INTRAVENOUS | Status: AC | PRN
  Administered 2021-09-05: 75 mL via INTRAVENOUS

## 2021-09-05 MED ORDER — PANTOPRAZOLE SODIUM 40 MG IV SOLR
40.0000 mg | Freq: Every day | INTRAVENOUS | Status: DC
  Administered 2021-09-06 – 2021-09-08 (×4): 40 mg via INTRAVENOUS
  Filled 2021-09-05 (×4): qty 10

## 2021-09-05 MED ORDER — STROKE: EARLY STAGES OF RECOVERY BOOK: Freq: Once | Status: DC

## 2021-09-05 NOTE — H&P (Addendum)
?                    NEUROHOSPITALIST HISTORY AND PHYSICAL  ? ?Requestig physician: Dr. Doren Custard ? ?Reason for Consult: Acute onset of left sided weakness, gait imbalance and dysarthria ? ?History obtained from:  Patient, EMS and Chart    ? ?HPI:                                                                                                                                         ? Mark Herrera is an 61 y.o. male with a PMHx of CAD s/p stenting, cocaine abuse, strokes x 2 without residual deficit, gout and tobacco abuse presenting to the ED as a Code Stroke via EMS after girlfriend noticed sudden onset of dysarthria, left facial droop, left sided weakness and gait instability. Time of symptom onset was the same as LKN: 1740. Vitals per EMS: 128/80 and CBG 118. On arrival to the ED the patient continued to be weak on the left side. He is on atorvastatin, ASA and Plavix as an outpatient.  ? ?Past Medical History:  ?Diagnosis Date  ? CAD (coronary artery disease)   ? 10/08/16 STEMI DES x1 to pLAD EF 45%  ? Cocaine abuse ([place])   ? CVA (cerebral vascular accident) [facility])   ? Gout   ? Tobacco abuse   ? ? ?Past Surgical History:  ?Procedure Laterality Date  ? CORONARY STENT INTERVENTION N/A 10/08/2016  ? Procedure: Coronary Stent Intervention;  Surgeon: Burnell Blanks, MD;  Location: [place] [facility];  Service: Cardiovascular;  Laterality: N/A;  ? IR CT HEAD LTD  05/31/2020  ? IR PERCUTANEOUS ART THROMBECTOMY/INFUSION INTRACRANIAL INC DIAG ANGIO  05/31/2020  ? IR US GUIDE VASC ACCESS RIGHT  05/31/2020  ? LEFT HEART CATH AND CORONARY ANGIOGRAPHY N/A 10/08/2016  ? Procedure: Left Heart Cath and Coronary Angiography;  Surgeon: Burnell Blanks, MD;  Location: [place] [facility];  Service: Cardiovascular;  Laterality: N/A;  ? RADIOLOGY WITH ANESTHESIA N/A 05/31/2020  ? Procedure: IR WITH ANESTHESIA;  Surgeon: Radiologist, Medication, MD;  Location: [place];  Service: Radiology;  Laterality: N/A;  ? TOE SURGERY     ? ? ?Family History  ?Problem Relation Age of Onset  ? Hypertension Father   ?          ? ?Social History:  reports that he has been smoking cigarettes. He has been smoking an average of 1 pack per day. He has never used smokeless tobacco. He reports current alcohol use. He reports current drug use. Drug: Marijuana. ? ?No Known Allergies ? ?MEDICATIONS:                                                                                                                     ?  No current facility-administered medications on file prior to encounter.  ? ?Current Outpatient Medications on File Prior to Encounter  ?Medication Sig Dispense Refill  ? aspirin 81 MG chewable tablet Chew 1 tablet (81 mg total) by mouth daily. 30 tablet 0  ? atorvastatin (LIPITOR) 80 MG tablet Take 1 tablet (80 mg total) by mouth daily. 30 tablet 0  ? clopidogrel (PLAVIX) 75 MG tablet Take 1 tablet (75 mg total) by mouth daily. 30 tablet 0  ? Olopatadine HCl 0.2 % SOLN Apply 1 drop to eye daily. 2.5 mL 0  ? trimethoprim-polymyxin b (POLYTRIM) ophthalmic solution 1-2 gtts in both eyes QID for 5-7 days 10 mL 0  ? ? ?ROS:                                                                                                                                       ?As per HPI. Detailed ROS deferred in the context of acuity of presentation.  ? ? ?Weight 77.8 kg. ? ? ?General Examination:                                                                                                      ? ?Physical Exam  ?HEENT-  [place]/AT   ?Lungs- Respirations unlabored ?Extremities- No edema ? ?Neurological Examination ?Mental Status: Awake and alert. Oriented x 5. Speech is dysarthric but fluent, with intact comprehension and naming. Dense left sided neglect; head and direction of gaze persistently tend to be to the left; also states "that is not my hand" when his left hand is held up for him to see. Anosognosia; states that he does not feel weak anywhere.  ?Cranial Nerves: ?II:  Left homonymous hemianopsia. PERRL  ?III,IV, VI: No ptosis. EOM full but with right sided gaze preference. No nystagmus.  ?V: Decreased left sided sensation ?VII: Prominent left facial droop ?VIII: Hearing intact to voice ?IX,X: No hypophonia or hoarseness ?XI: Left sided weakness ?XII: Leftward tongue deviation with extension ?Motor: ?RUE and RLE 5/5 ?LUE fluctuates between 3 and 4/5 with grossly dysmetric movements  ?LLE fluctuates between 3 and 4/5 with grossly dysmetric movements ?Sensory: Moderately to severely decreased touch, scratch and pressure sensation to LUE and LLE. Normal on the right.  ?Deep Tendon Reflexes: 2+ and symmetric throughout ?Plantars: Right: downgoing   Left: Equivocal ?Cerebellar: No ataxia that is disproportionate to weakness with left FNF and H-S. Normal on the right.  ?Gait: Deferred ? ?NIHSS: 12 ?  ?Lab Results: ?Basic Metabolic  Panel: ?No results for input(s): NA, K, CL, CO2, GLUCOSE, BUN, CREATININE, CALCIUM, MG, PHOS in the last 168 hours. ? ?CBC: ?No results for input(s): WBC, NEUTROABS, HGB, HCT, MCV, PLT in the last 168 hours. ? ?Cardiac Enzymes: ?No results for input(s): CKTOTAL, CKMB, CKMBINDEX, TROPONINI in the last 168 hours. ? ?Lipid Panel: ?No results for input(s): CHOL, TRIG, HDL, CHOLHDL, VLDL, LDLCALC in the last 168 hours. ? ?Imaging: ?No results found. ? ? ?Assessment: 61 y.o. male with a PMHx of CAD s/p stenting, cocaine abuse, strokes x 2 without residual deficit, gout and tobacco abuse presenting to the ED as a Code Stroke via EMS after girlfriend noticed sudden onset of dysarthria, left facial droop, left sided weakness and gait instability. Time of symptom onset was the same as LKN: 1740. Vitals per EMS: 128/80 and CBG 118. On arrival to the ED the patient continued to be weak on the left side. He is on atorvastatin, ASA and Plavix as an outpatient.  ?1. Exam reveals findings best referable to the right cerebral hemisphere, MCA territory ?2. CT head: No acute  intracranial process. ASPECTS is 10. Expected evolution of previously noted infarcts in the right ?insula and posterior right temporal lobe, with encephalomalacia. ?3. CTA of head and neck: Right proximal M2 occlusion, just distal to where the right MCA occluded on 05/31/2020, with poor perfusion of more distal right MCA branches. No additional hemodynamically significant intracranial ?stenosis. No hemodynamically significant stenosis in the neck. ?4. Stroke risk factors: CAD, cocaine abuse, strokes x 2 and tobacco abuse  ?5. After comprehensive review of possible contraindications, he has no absolute contraindications to TNK administration ?6. Patient is a TNK candidate. Discussed extensively the risks/benefits of TNK treatment vs. no treatment with the patient, including risks of hemorrhage and death with TNK administration versus worse overall outcomes on average in patients within the IV thrombolytic time window who are not administered TNK. Overall benefits of TNK regarding long-term prognosis are felt to outweigh risks. The patient expressed understanding and wish to proceed with TNK.  ?7. Patient improved significantly after TNK with NIHSS decreasing from 12 to 3 while preparing for VIR, which his daughter provided consent for, as initially the patient's anosognosia worsened and he became agitated, requesting to leave the hospital after TNK was given, but prior to the significant clinical improvement. After he improved, risks/benefits then favored not proceeding with thrombectomy.  ? ? ?Recommendations: ?1. Admitting to Neuro ICU.  ?2. Post-TNK order set to include frequent neuro checks and BP management.  ?3. No antiplatelet medications or anticoagulants for at least 24 hours following TNK.  ?4. DVT prophylaxis with SCDs.  ?5. Continue atorvastatin.  ?6. Will need to restart DAPT if follow up CT at 24 hours is negative for hemorrhagic conversion. ?7. MRI brain  ?8. TTE.  ?9. PT/OT/Speech.  ?10. NPO until  passes swallow evaluation.  ?11. Telemetry monitoring ?12. Fasting lipid panel, HgbA1c ? ?60 minutes spent in the emergent neurological evaluation and management of this critically ill patient ? ? [employer]

## 2021-09-05 NOTE — Anesthesia Preprocedure Evaluation (Deleted)
Anesthesia Evaluation  ? ? ?Reviewed: ?Allergy & Precautions, Patient's Chart, lab work & pertinent test results ? ?History of Anesthesia Complications ?Negative for: history of anesthetic complications ? ?Airway ? ? ? ? ? ? ? Dental ?  ?Pulmonary ?Current Smoker,  ?  ? ? ? ? ? ? ? Cardiovascular ?hypertension, + CAD, + Past MI and + Cardiac Stents  ? ? ? ?'21 TTE - EF 25 to 30%. Global hypokinesis. The left ventricular internal cavity size was moderately dilated. Grade II diastolic dysfunction (pseudonormalization). Left atrial size was moderately dilated. Mild mitral valve regurgitation.  ? ?  ?Neuro/Psych ?CVA   ? GI/Hepatic ?(+)  ?  ? substance abuse ? cocaine use and marijuana use,   ?Endo/Other  ? ? Renal/GU ?  ? ?  ?Musculoskeletal ? ? Abdominal ?  ?Peds ? Hematology ? ?On plavix ?   ?Anesthesia Other Findings ? ? Reproductive/Obstetrics ? ?  ? ? ? ? ? ? ? ? ? ? ? ? ? ?  ?  ? ? ? ? ? ? ? ?Anesthesia Physical ?Anesthesia Plan ? ?ASA: 4 and emergent ? ?Anesthesia Plan: General  ? ?Post-op Pain Management: Minimal or no pain anticipated  ? ?Induction: Intravenous and Rapid sequence ? ?PONV Risk Score and Plan: 2 and Treatment may vary due to age or medical condition, Ondansetron and Dexamethasone ? ?Airway Management Planned: Oral ETT ? ?Additional Equipment: Arterial line ? ?Intra-op Plan:  ? ?Post-operative Plan: Possible Post-op intubation/ventilation ? ?Informed Consent:  ? ? ? ?Only emergency history available and History available from chart only ? ?Plan Discussed with: CRNA and Anesthesiologist ? ?Anesthesia Plan Comments:   ? ? ? ? ? ?Anesthesia Quick Evaluation ? ?

## 2021-09-05 NOTE — Progress Notes (Signed)
PHARMACIST CODE STROKE RESPONSE ? ?Notified to mix TNK at [place] by Dr. Cheral Marker ?Delivered TNK to RN at 1837 ? ?TNK dose = 19 mg IV over 5 seconds ? ?Issues/delays encountered (if applicable): None ? ?Zenaida Deed, PharmD ?PGY1 Acute Care Pharmacy Resident  ?Phone: [phone] ?09/05/2021  6:39 PM ? ?Please check [email or web address] for unit-specific pharmacy phone numbers. ? ?

## 2021-09-05 NOTE — Code Documentation (Signed)
Patient arrived to [facility] via EMS after pt's girlfriend noticed left sided weakness and a facial droop and called 911. EMS activated the code stroke. He was LKW right before the event at 1740. Pt was taken for CT and cleared by EDP. He is not on blood thinners and was given TNK at 1838. Pt's NIHSS 12 (see code stroke documentation for details). He had left sided weakness, slurred speech, left neglect. A CTA was completed which showed an LVO. Pt's daughter gave consent for IR. Code IR was paged. Pt was taken into the suite where he was adamant he did not want the procedure. Neurologist met [place] in the suite and talked with the patient. He made significant improvement by that time and his NIHSS went to a 4. Code IR canceled and pt taken back to ED. He is telling staff he will not be staying the night and that its "an emotional thing" since he lost his wife and other close family members in the hospital. Pt's girlfriend now at the bedside.  ? ?CarePlan: MRI, q15x2, q30x6, q1x16 neuro/vital checks. BP<180/105.  ? ?Hand off with [place] ED RN.  ?

## 2021-09-05 NOTE — ED Notes (Signed)
Pt currently in IR

## 2021-09-05 NOTE — ED Provider Notes (Signed)
?[facility] EMERGENCY [facility] ?Provider Note ? ? ?CSN: [id number] ?Arrival date & time: 09/05/21  1813 ? ?An emergency department physician performed an initial assessment on this suspected stroke patient at 1818. ? ?History ? ?No chief complaint on file. ? ? ?Mark Herrera is a 61 y.o. male. ? ?HPI ?Patient presents for strokelike symptoms.  Onset was at 5:40 PM.  He had facial asymmetry, slurred speech, and left-sided weakness.  His medical history includes CAD, tobacco use, HTN, cocaine abuse, and prior stroke.  Per chart review, patient was seen in December 2021 with a right MCA stroke.  He underwent thrombectomy.  He left AMA.  He stated refusal to initiate aspirin, Plavix, and statin.  Of note, at that time he had similar symptoms to today's presentation.  EMS reports normal blood glucose prior to arrival. ?  ? ?Home Medications ?Prior to Admission medications   ?Medication Sig Start Date End Date Taking? Authorizing Provider  ?aspirin 81 MG chewable tablet Chew 1 tablet (81 mg total) by mouth daily. 06/01/20   Arline AspWilliams, Jessica N, NP  ?atorvastatin (LIPITOR) 80 MG tablet Take 1 tablet (80 mg total) by mouth daily. 06/01/20   Arline AspWilliams, Jessica N, NP  ?clopidogrel (PLAVIX) 75 MG tablet Take 1 tablet (75 mg total) by mouth daily. 06/01/20   Arline AspWilliams, Jessica N, NP  ?Olopatadine HCl 0.2 % SOLN Apply 1 drop to eye daily. 07/04/21   Mardella LaymanHagler, Brian, MD  ?trimethoprim-polymyxin b (POLYTRIM) ophthalmic solution 1-2 gtts in both eyes QID for 5-7 days 07/04/21   Mardella LaymanHagler, Brian, MD  ?   ? ?Allergies    ?Patient has no known allergies.   ? ?Review of Systems   ?Review of Systems  ?Unable to perform ROS: Other (Speech difficulty)  ? ?Physical Exam ?Updated Vital Signs ?BP 134/68   Pulse 64   Temp 98 ?F (36.7 ?C)   Resp 10   Wt 77.8 kg   SpO2 100%   BMI 25.70 kg/m?  ?Physical Exam ?Vitals and nursing note reviewed.  ?Constitutional:   ?   General: He is not in acute distress. ?   Appearance: He is  well-developed. He is ill-appearing. He is not toxic-appearing or diaphoretic.  ?HENT:  ?   Head: Normocephalic and atraumatic.  ?   Right Ear: External ear normal.  ?   Left Ear: External ear normal.  ?   Nose: Nose normal.  ?   Mouth/Throat:  ?   Mouth: Mucous membranes are moist.  ?Eyes:  ?   Conjunctiva/sclera: Conjunctivae normal.  ?Cardiovascular:  ?   Rate and Rhythm: Normal rate and regular rhythm.  ?   Heart sounds: No murmur heard. ?Pulmonary:  ?   Effort: Pulmonary effort is normal. No respiratory distress.  ?   Breath sounds: Normal breath sounds. No wheezing or rales.  ?Chest:  ?   Chest wall: No tenderness.  ?Abdominal:  ?   Palpations: Abdomen is soft.  ?   Tenderness: There is no abdominal tenderness.  ?Musculoskeletal:     ?   General: No swelling.  ?   Cervical back: Neck supple. No rigidity.  ?   Right lower leg: No edema.  ?   Left lower leg: No edema.  ?Skin: ?   General: Skin is warm and dry.  ?   Coloration: Skin is not jaundiced or pale.  ?Neurological:  ?   Mental Status: He is alert.  ?   Cranial Nerves: Dysarthria and facial asymmetry  present.  ?   Sensory: Sensory deficit present.  ?   Motor: Weakness present.  ? ? ?ED Results / Procedures / Treatments   ?Labs ?(all labs ordered are listed, but only abnormal results are displayed) ?Labs Reviewed  ?COMPREHENSIVE METABOLIC PANEL - Abnormal; Notable for the following components:  ?    Result Value  ? Glucose, Bld 111 (*)   ? Calcium 8.0 (*)   ? Total Protein 5.3 (*)   ? Albumin 3.1 (*)   ? All other components within normal limits  ?I-STAT CHEM 8, ED - Abnormal; Notable for the following components:  ? Glucose, Bld 109 (*)   ? Calcium, Ion 0.96 (*)   ? All other components within normal limits  ?CBG MONITORING, ED - Abnormal; Notable for the following components:  ? Glucose-Capillary 106 (*)   ? All other components within normal limits  ?PROTIME-INR  ?APTT  ?CBC  ?DIFFERENTIAL  ?URINALYSIS, ROUTINE W REFLEX MICROSCOPIC  ?RAPID URINE DRUG  SCREEN, HOSP PERFORMED  ?HIV ANTIBODY (ROUTINE TESTING W REFLEX)  ?HEMOGLOBIN A1C  ?LIPID PANEL  ? ? ?EKG ?None ? ?Radiology ?CT HEAD CODE STROKE WO CONTRAST ? ?Result Date: 09/05/2021 ?CLINICAL DATA:  Code stroke. Left-sided facial droop and weakness abnormal gait EXAM: CT HEAD WITHOUT CONTRAST TECHNIQUE: Contiguous axial images were obtained from the base of the skull through the vertex without intravenous contrast. RADIATION DOSE REDUCTION: This exam was performed according to the departmental dose-optimization program which includes automated exposure control, adjustment of the mA and/or kV according to patient size and/or use of iterative reconstruction technique. COMPARISON:  05/31/2020 CT head, 06/01/2020 MRI head FINDINGS: Brain: No evidence of acute infarction, hemorrhage, cerebral edema, mass, mass effect, or midline shift. No hydrocephalus or extra-axial fluid collection. Hypodensity in the right insula and posterior right temporal lobe, which correlate with the areas of infarct on the prior MRI and are consistent with expected evolution. Vascular: No hyperdense vessel. Skull: Normal. Negative for fracture or focal lesion. Sinuses/Orbits: Mucous retention cyst in the right frontal sinus. Otherwise clear. The orbits are unremarkable. Other: The mastoid air cells are well aerated. ASPECTS [facility] Stroke Program Early CT Score) - Ganglionic level infarction (caudate, lentiform nuclei, internal capsule, insula, M1-M3 cortex): 7 - Supraganglionic infarction (M4-M6 cortex): 3 Total score (0-10 with 10 being normal): 10 IMPRESSION: 1. No acute intracranial process. 2. ASPECTS is 10 3. Expected evolution of previously noted infarcts in the right insula and posterior right temporal lobe, with encephalomalacia. Code stroke imaging results were communicated on 09/05/2021 at 6:31 pm to provider Dr. Otelia Limes via secure text paging. Electronically Signed   By: Wiliam Ke M.D.   On: 09/05/2021 18:31  ? ?CT ANGIO HEAD  NECK W WO CM (CODE STROKE) ? ?Result Date: 09/05/2021 ?CLINICAL DATA:  Stroke suspected left-sided facial droop and weakness EXAM: CT ANGIOGRAPHY HEAD AND NECK TECHNIQUE: Multidetector CT imaging of the head and neck was performed using the standard protocol during bolus administration of intravenous contrast. Multiplanar CT image reconstructions and MIPs were obtained to evaluate the vascular anatomy. Carotid stenosis measurements (when applicable) are obtained utilizing NASCET criteria, using the distal internal carotid diameter as the denominator. RADIATION DOSE REDUCTION: This exam was performed according to the departmental dose-optimization program which includes automated exposure control, adjustment of the mA and/or kV according to patient size and/or use of iterative reconstruction technique. CONTRAST:  77mL OMNIPAQUE IOHEXOL 350 MG/ML SOLN COMPARISON:  CTA head neck 05/31/2020, correlation is also made to CT head  09/05/2021. FINDINGS: CT HEAD FINDINGS For noncontrast findings, please see same day CT head. CTA NECK FINDINGS Aortic arch: Standard branching. Imaged portion shows no evidence of aneurysm or dissection. No significant stenosis of the major arch vessel origins. Right carotid system: No evidence of dissection, stenosis (50% or greater) or occlusion. Left carotid system: No evidence of dissection, stenosis (50% or greater) or occlusion. Vertebral arteries: Left dominant. No evidence of dissection, stenosis (50% or greater) or occlusion. Skeleton: Degenerative changes in the cervical spine, similar to prior. No acute osseous abnormality. Poor dentition, with multiple periapical lucencies and dental caries. Other neck: No mass or adenopathy. Upper chest: No focal pulmonary opacity or pleural effusion. Emphysema. Review of the MIP images confirms the above findings CTA HEAD FINDINGS Anterior circulation: Both internal carotid arteries are patent to the termini, without significant stenosis. A1 segments  patent. Normal anterior communicating artery. Anterior cerebral arteries are patent to their distal aspects. Right proximal M2 occlusion (series 8, image [id number]), possibly involving the bifurcation, which appears to

## 2021-09-05 NOTE — ED Notes (Signed)
Assisted getting CBG it is 106. ?

## 2021-09-06 ENCOUNTER — Inpatient Hospital Stay (HOSPITAL_COMMUNITY): Payer: Self-pay

## 2021-09-06 ENCOUNTER — Encounter (HOSPITAL_COMMUNITY): Payer: Self-pay | Admitting: Radiology

## 2021-09-06 DIAGNOSIS — I6389 Other cerebral infarction: Secondary | ICD-10-CM

## 2021-09-06 LAB — MRSA NEXT GEN BY PCR, NASAL: MRSA by PCR Next Gen: NOT DETECTED

## 2021-09-06 LAB — LIPID PANEL
Cholesterol: 159 mg/dL (ref 0–200)
HDL: 42 mg/dL (ref >40)
LDL Cholesterol: 105 mg/dL — ABNORMAL HIGH (ref 0–99)
Total CHOL/HDL Ratio: 3.8 RATIO
Triglycerides: 59 mg/dL (ref <150)
VLDL: 12 mg/dL (ref 0–40)

## 2021-09-06 LAB — ECHOCARDIOGRAM COMPLETE
Area-P 1/2: 3.11 cm2
S' Lateral: 4.8 cm
Weight: 2744.29 oz

## 2021-09-06 LAB — GLUCOSE, CAPILLARY
Glucose-Capillary: 93 mg/dL (ref 70–99)
Glucose-Capillary: 142 mg/dL — ABNORMAL HIGH (ref 70–99)

## 2021-09-06 LAB — HEMOGLOBIN A1C
Hgb A1c MFr Bld: 5.3 % (ref 4.8–5.6)
Mean Plasma Glucose: 105.41 mg/dL

## 2021-09-06 LAB — HIV ANTIBODY (ROUTINE TESTING W REFLEX): HIV Screen 4th Generation wRfx: NONREACTIVE

## 2021-09-06 MED ORDER — CHLORHEXIDINE GLUCONATE CLOTH 2 % EX PADS
6.0000 | MEDICATED_PAD | Freq: Every day | CUTANEOUS | Status: DC
  Administered 2021-09-07 – 2021-09-08 (×2): 6 via TOPICAL

## 2021-09-06 MED ORDER — ATORVASTATIN CALCIUM 40 MG PO TABS
40.0000 mg | ORAL_TABLET | Freq: Every day | ORAL | Status: DC
  Administered 2021-09-06 – 2021-09-09 (×4): 40 mg via ORAL
  Filled 2021-09-06 (×3): qty 1

## 2021-09-06 MED ORDER — PERFLUTREN LIPID MICROSPHERE
1.0000 mL | INTRAVENOUS | Status: AC | PRN
  Administered 2021-09-06: 3 mL via INTRAVENOUS
  Filled 2021-09-06: qty 10

## 2021-09-06 MED ORDER — SODIUM CHLORIDE 0.9 % IV SOLN
Freq: Once | INTRAVENOUS | Status: DC
Start: 2021-09-06 — End: 2021-09-09

## 2021-09-06 NOTE — Evaluation (Cosign Needed)
Speech Language Pathology Evaluation ?Patient Details ?Name: Mark Herrera ?MRN: [id number] ?DOB: [birth year] ?Today's Date: 09/06/2021 ?Time: 1330-1350 ?SLP Time Calculation (min) (ACUTE ONLY): 20 min ? ?Problem List:  ?Patient Active Problem List  ? Diagnosis Date Noted  ? Status post stroke 05/31/2020  ? Stroke (cerebrum) (HCC) 05/31/2020  ? Essential hypertension 06/01/2019  ? Cocaine abuse (HCC) 06/01/2019  ? Stroke-like episode s/p tPA 05/31/2019  ? Hyperlipemia 10/11/2016  ? Tobacco abuse 10/11/2016  ? STEMI (ST elevation myocardial infarction) (HCC) 10/09/2016  ? Acute ST elevation myocardial infarction (STEMI) involving left anterior descending (LAD) coronary artery (HCC)   ? ?Past Medical History:  ?Past Medical History:  ?Diagnosis Date  ? CAD (coronary artery disease)   ? 10/08/16 STEMI DES x1 to pLAD EF 45%  ? Cocaine abuse (HCC)   ? CVA (cerebral vascular accident) [facility])   ? Gout   ? Tobacco abuse   ? ?Past Surgical History:  ?Past Surgical History:  ?Procedure Laterality Date  ? CORONARY STENT INTERVENTION N/A 10/08/2016  ? Procedure: Coronary Stent Intervention;  Surgeon: Kathleene Hazel, MD;  Location: [facility] INVASIVE CV [facility];  Service: Cardiovascular;  Laterality: N/A;  ? IR CT HEAD LTD  05/31/2020  ? IR PERCUTANEOUS ART THROMBECTOMY/INFUSION INTRACRANIAL INC DIAG ANGIO  05/31/2020  ? IR US GUIDE VASC ACCESS RIGHT  05/31/2020  ? LEFT HEART CATH AND CORONARY ANGIOGRAPHY N/A 10/08/2016  ? Procedure: Left Heart Cath and Coronary Angiography;  Surgeon: Kathleene Hazel, MD;  Location: [facility] INVASIVE CV LAB;  Service: Cardiovascular;  Laterality: N/A;  ? RADIOLOGY WITH ANESTHESIA N/A 05/31/2020  ? Procedure: IR WITH ANESTHESIA;  Surgeon: Radiologist, Medication, MD;  Location: [facility] OR;  Service: Radiology;  Laterality: N/A;  ? RADIOLOGY WITH ANESTHESIA N/A 09/05/2021  ? Procedure: RADIOLOGY WITH ANESTHESIA;  Surgeon: Radiologist, Medication, MD;  Location: [facility] OR;  Service: Radiology;  Laterality: N/A;  ? TOE  SURGERY    ? ?HPI:  ?Mark Herrera is an 61 y.o. male with a PMHx of CAD s/p stenting, cocaine abuse, strokes x 2 without residual deficit, gout and tobacco abuse presenting to the ED as a Code Stroke via EMS after girlfriend noticed sudden onset of dysarthria, left facial droop, left sided weakness and gait instability.  ? ?Assessment / Plan / Recommendation ?Clinical Impression ? Patient was seen at bedside for speech-language evaluation. Pt endorsed that he was lethargic, and dozed off 1x during the evaluation. At baseline, pt reports that he is retired and lives at home with girlfriend and daughter. He verbalized that he is responsible for a lot around the house, including cooking, cleaning and finances. He reports that he does not take any medications at baseline. SLP administered SLUMS evaluation and pt scored 24/30 indicating a mild cognitive-communication deficit. Per SLUMS, patient's primary deficit is with short-term memory recall tasks. Of note, could only remember 2/5 objects for delayed recall task. Pt exhibited greater frustration with this task, and required verbal encouragement and choice cues to improve accuracy to 4/5. SLP also facilitated following commands task where pt had to cross midline and touch R hand to L body part & vice versa. SLP provided extensive education to the pt about incorporating external memory aids (i.e., planner, pen & pad, check-list) in order to aid in acute changes in memory. Patient's level of lethargy may also be impacting memory and recall. Education completed and SLP to sign off. ?   ?SLP Assessment ? SLP Recommendation/Assessment: Patient does not need any further Speech  [place] ?SLP Visit Diagnosis: Cognitive communication deficit (R41.841)  ?  ?Recommendations for follow up therapy are one component of a multi-disciplinary discharge planning process, led by the attending physician.  Recommendations may be updated based on patient status,  additional functional criteria and insurance authorization. ?   ?Follow Up Recommendations ? No SLP follow up  ?  ?Assistance Recommended at Discharge ? None  ?Functional Status Assessment Patient has had a recent decline in their functional status and demonstrates the ability to make significant improvements in function in a reasonable and predictable amount of time.  ?Frequency and Duration    ?  ?  ?   ?SLP Evaluation ?Cognition ? Overall Cognitive Status: Within Functional Limits for tasks assessed ?Arousal/Alertness: Lethargic ?Orientation Level: Oriented to person;Oriented to place;Oriented to time;Oriented to situation ?Year: 2023 ?Day of Week: Correct ?Attention: Focused ?Focused Attention: Appears intact ?Memory: Impaired ?Memory Impairment: Storage deficit;Decreased recall of new information;Decreased short term memory ?Decreased Short Term Memory: Verbal basic;Functional basic ?Awareness: Appears intact ?Problem Solving: Appears intact ?Safety/Judgment: Appears intact  ?  ?   ?Comprehension ? Auditory Comprehension ?Overall Auditory Comprehension: Appears within functional limits for tasks assessed ?Yes/No Questions: Within Functional Limits ?Commands: Within Functional Limits ?Conversation: Complex ?Interfering Components: Other (comment) (lethargy) ?Visual Recognition/Discrimination ?Discrimination: Not tested ?Reading Comprehension ?Reading Status: Not tested  ?  ?Expression Expression ?Primary Mode of Expression: Verbal ?Verbal Expression ?Overall Verbal Expression: Appears within functional limits for tasks assessed ?Initiation: No impairment ?Automatic Speech: Social Response;Name ?Level of Generative/Spontaneous Verbalization: Conversation ?Naming: No impairment ?Pragmatics: No impairment ?Effective Techniques: Open ended questions;Semantic cues ?Non-Verbal Means of Communication: Not applicable ?Written Expression ?Dominant Hand: Right ?Written Expression: Not tested   ?Oral / Motor ? Oral  Motor/Sensory Function ?Overall Oral Motor/Sensory Function: Within functional limits ?Motor Speech ?Overall Motor Speech: Appears within functional limits for tasks assessed ?Respiration: Within functional limits ?Phonation: Normal ?Resonance: Within functional limits ?Articulation: Within functional limitis ?Intelligibility: Intelligible ?Motor Planning: Witnin functional limits ?Motor Speech Errors: Not applicable   ?        ? ?Ezekiel Slocumb ?09/06/2021, 2:02 PM ? ?

## 2021-09-06 NOTE — Progress Notes (Addendum)
STROKE TEAM PROGRESS NOTE  ? ?INTERVAL HISTORY ?Mark Herrera is an 61 y.o. male with a PMHx of CAD s/p stenting, cocaine abuse, strokes x 2 without residual deficit, gout and tobacco abuse. Code stroke called due to new deficits of dysarthria, L facial droop, Left weakness, and gait instability. LKW was 45 minutes prior to arrival. Initial NIHSS of 12.  CT angios showed proximal right M2 occlusion just distal to the previous right MCA occlusion on 05/31/2020 with poor perfusion of the more distal right MCA branches.  Given TNK with improvement in NIHSS to 3. He refused IR intervention. ? ?Called to assess patient this morning. NIHSS of 5 (see exam below). Code IR not called.  ? ?Vitals:  ? 09/06/21 1100 09/06/21 1159 09/06/21 1200 09/06/21 1300  ?BP: 134/76  123/83 115/73  ?Pulse: 67  (!) 55 68  ?Resp:      ?Temp:  98.5 ?F (36.9 ?C)    ?TempSrc:  Oral    ?SpO2:      ?Weight:      ? ?CBC:  ?Recent Labs  ?Lab 09/05/21 ?1816 09/05/21 ?1821  ?WBC 5.7  --   ?NEUTROABS 3.0  --   ?HGB 15.1 15.6  ?HCT 46.2 46.0  ?MCV 90.8  --   ?PLT 195  --   ? ?Basic Metabolic Panel:  ?Recent Labs  ?Lab 09/05/21 ?1816 09/05/21 ?1821  ?NA 136 137  ?K 4.1 4.0  ?CL 107 106  ?CO2 22  --   ?GLUCOSE 111* 109*  ?BUN 7 8  ?CREATININE 1.07 1.00  ?CALCIUM 8.0*  --   ? ?Lipid Panel:  ?Recent Labs  ?Lab 09/06/21 ?0701  ?CHOL 159  ?TRIG 59  ?HDL 42  ?CHOLHDL 3.8  ?VLDL 12  ?LDLCALC 105*  ? ?HgbA1c:  ?Recent Labs  ?Lab 09/06/21 ?0700  ?HGBA1C 5.3  ? ?Urine Drug Screen: No results for input(s): LABOPIA, COCAINSCRNUR, LABBENZ, AMPHETMU, THCU, LABBARB in the last 168 hours.  ?Alcohol Level No results for input(s): ETH in the last 168 hours. ? ?IMAGING past 24 hours ?CT HEAD CODE STROKE WO CONTRAST ? ?Result Date: 09/05/2021 ?CLINICAL DATA:  Code stroke. Left-sided facial droop and weakness abnormal gait EXAM: CT HEAD WITHOUT CONTRAST TECHNIQUE: Contiguous axial images were obtained from the base of the skull through the vertex without intravenous  contrast. RADIATION DOSE REDUCTION: This exam was performed according to the departmental dose-optimization program which includes automated exposure control, adjustment of the mA and/or kV according to patient size and/or use of iterative reconstruction technique. COMPARISON:  05/31/2020 CT head, 06/01/2020 MRI head FINDINGS: Brain: No evidence of acute infarction, hemorrhage, cerebral edema, mass, mass effect, or midline shift. No hydrocephalus or extra-axial fluid collection. Hypodensity in the right insula and posterior right temporal lobe, which correlate with the areas of infarct on the prior MRI and are consistent with expected evolution. Vascular: No hyperdense vessel. Skull: Normal. Negative for fracture or focal lesion. Sinuses/Orbits: Mucous retention cyst in the right frontal sinus. Otherwise clear. The orbits are unremarkable. Other: The mastoid air cells are well aerated. ASPECTS [facility] Stroke Program Early CT Score) - Ganglionic level infarction (caudate, lentiform nuclei, internal capsule, insula, M1-M3 cortex): 7 - Supraganglionic infarction (M4-M6 cortex): 3 Total score (0-10 with 10 being normal): 10 IMPRESSION: 1. No acute intracranial process. 2. ASPECTS is 10 3. Expected evolution of previously noted infarcts in the right insula and posterior right temporal lobe, with encephalomalacia. Code stroke imaging results were communicated on 09/05/2021 at 6:31 pm to  provider Dr. Cheral Marker via secure text paging. Electronically Signed   By: Merilyn Baba M.D.   On: 09/05/2021 18:31  ? ?CT ANGIO HEAD NECK W WO CM (CODE STROKE) ? ?Result Date: 09/05/2021 ?CLINICAL DATA:  Stroke suspected left-sided facial droop and weakness EXAM: CT ANGIOGRAPHY HEAD AND NECK TECHNIQUE: Multidetector CT imaging of the head and neck was performed using the standard protocol during bolus administration of intravenous contrast. Multiplanar CT image reconstructions and MIPs were obtained to evaluate the vascular anatomy. Carotid  stenosis measurements (when applicable) are obtained utilizing NASCET criteria, using the distal internal carotid diameter as the denominator. RADIATION DOSE REDUCTION: This exam was performed according to the departmental dose-optimization program which includes automated exposure control, adjustment of the mA and/or kV according to patient size and/or use of iterative reconstruction technique. CONTRAST:  31mL OMNIPAQUE IOHEXOL 350 MG/ML SOLN COMPARISON:  CTA head neck 05/31/2020, correlation is also made to CT head 09/05/2021. FINDINGS: CT HEAD FINDINGS For noncontrast findings, please see same day CT head. CTA NECK FINDINGS Aortic arch: Standard branching. Imaged portion shows no evidence of aneurysm or dissection. No significant stenosis of the major arch vessel origins. Right carotid system: No evidence of dissection, stenosis (50% or greater) or occlusion. Left carotid system: No evidence of dissection, stenosis (50% or greater) or occlusion. Vertebral arteries: Left dominant. No evidence of dissection, stenosis (50% or greater) or occlusion. Skeleton: Degenerative changes in the cervical spine, similar to prior. No acute osseous abnormality. Poor dentition, with multiple periapical lucencies and dental caries. Other neck: No mass or adenopathy. Upper chest: No focal pulmonary opacity or pleural effusion. Emphysema. Review of the MIP images confirms the above findings CTA HEAD FINDINGS Anterior circulation: Both internal carotid arteries are patent to the termini, without significant stenosis. A1 segments patent. Normal anterior communicating artery. Anterior cerebral arteries are patent to their distal aspects. Right proximal M2 occlusion (series 8, image [id number]), possibly involving the bifurcation, which appears to be just distal to where the vessel occluded on 05/31/2020. The anterior right M2 is patent but not particularly well perfused compared to the left. Poor perfusion of the distal right MCA branches.  No left M1 stenosis or occlusion. Normal left MCA bifurcation. Distal left MCA branches perfused. Posterior circulation: Vertebral arteries patent to the vertebrobasilar junction without stenosis. The right V4 primarily supplies the right PICA posterior inferior cerebral arteries patent bilaterally. Basilar patent to its distal aspect. Superior cerebellar arteries patent bilaterally. Patent P1 segments. PCAs perfused to their distal aspects without stenosis. The bilateral posterior communicating arteries are not visualized. Venous sinuses: As permitted by contrast timing, patent. Anatomic variants: None significant. Review of the MIP images confirms the above findings IMPRESSION: 1. Right proximal M2 occlusion, just distal to where the right MCA occluded on 05/31/2020, with poor perfusion of more distal right MCA branches. No additional hemodynamically significant intracranial stenosis. 2.  No hemodynamically significant stenosis in the neck. Code stroke imaging results were communicated on 09/05/2021 at 6:50 pm to provider Dr. Cheral Marker via telephone, who verbally acknowledged these results. Electronically Signed   By: Merilyn Baba M.D.   On: 09/05/2021 19:00   ? ?PHYSICAL EXAM ? ?Physical Exam  ?Constitutional: Appears well-developed and well-nourished middle-aged African-American male not in distress ?Psych: Affect appropriate to situation ?Eyes: No scleral injection ?HENT: No OP obstrucion ?MSK: no joint deformities.  ?Cardiovascular: Normal rate and regular rhythm.  ?Respiratory: Effort normal, non-labored breathing ?GI: Soft.  No distension. There is no tenderness.  ?Skin: WDI ? ?  Neuro: ?Mental Status: ?Patient is awake, alert, oriented to person, place, month, year, and situation. ?Cranial Nerves: ?II: L sided homonymous hemianopia. Pupils are equal, round, and reactive to light.   ?III,IV, VI: EOMI without ptosis or diploplia.  ?V: untested ?VII: L sided facial droop, mild ?VIII: Hearing is intact to voice ?X:  untested ?XI: Shoulder shrug is symmetric. ?XII: Tongue protrudes midline without atrophy or fasciculations.  ?Motor and sensory: ?Tone is normal. Bulk is normal. L arm drift and sensory loss ? ?ASSESSMENT/P

## 2021-09-06 NOTE — TOC CAGE-AID Note (Signed)
Transition of Care (TOC) - CAGE-AID Screening ? ? ?Patient Details  ?Name: Mark Herrera ?MRN: [id number] ?Date of Birth: 13-Nov-1960 ? ?Transition of Care (TOC) CM/SW Contact:    ?Prescilla Monger C Tarpley-Carter, LCSWA ?Phone Number: ?09/06/2021, 2:28 PM ? ? ?Clinical Narrative: ?Pt participated in Cage-Aid.  Pt stated he does use substance(cigarettes and marijuana).  Pt was offered resources, due to usage of substance.    ? ?Insurance underwriter, MSW, LCSW-A ?Pronouns:  She/Her/Hers ?[facility] ?Clinical Social Worker ?Direct Number:  [phone] ?[email or web address]  ? ?CAGE-AID Screening: ?  ? ?Have You Ever Felt You Ought to Cut Down on Your Drinking or Drug Use?: Yes ?Have People Annoyed You By Critizing Your Drinking Or Drug Use?: No ?Have You Felt Bad Or Guilty About Your Drinking Or Drug Use?: No ?Have You Ever Had a Drink or Used Drugs First Thing In The Morning to Steady Your Nerves or to Get Rid of a Hangover?: No ?CAGE-AID Score: 1 ? ?Substance Abuse Education Offered: Yes ? ?Substance abuse interventions: Educational Materials ? ? ? ? ? ? ?

## 2021-09-07 LAB — CBC WITH DIFFERENTIAL/PLATELET
Abs Immature Granulocytes: 0.03 10*3/uL (ref 0.00–0.07)
Basophils Absolute: 0 10*3/uL (ref 0.0–0.1)
Basophils Relative: 1 %
Eosinophils Absolute: 0.3 10*3/uL (ref 0.0–0.5)
Eosinophils Relative: 6 %
HCT: 40.8 % (ref 39.0–52.0)
Hemoglobin: 13.3 g/dL (ref 13.0–17.0)
Immature Granulocytes: 1 %
Lymphocytes Relative: 22 %
Lymphs Abs: 1.1 10*3/uL (ref 0.7–4.0)
MCH: 29.5 pg (ref 26.0–34.0)
MCHC: 32.6 g/dL (ref 30.0–36.0)
MCV: 90.5 fL (ref 80.0–100.0)
Monocytes Absolute: 0.5 10*3/uL (ref 0.1–1.0)
Monocytes Relative: 9 %
Neutro Abs: 3.2 10*3/uL (ref 1.7–7.7)
Neutrophils Relative %: 61 %
Platelets: 170 10*3/uL (ref 150–400)
RBC: 4.51 MIL/uL (ref 4.22–5.81)
RDW: 13.6 % (ref 11.5–15.5)
WBC: 5.1 10*3/uL (ref 4.0–10.5)
nRBC: 0 % (ref 0.0–0.2)

## 2021-09-07 LAB — COMPREHENSIVE METABOLIC PANEL
ALT: 13 U/L (ref 0–44)
AST: 15 U/L (ref 15–41)
Albumin: 2.6 g/dL — ABNORMAL LOW (ref 3.5–5.0)
Alkaline Phosphatase: 58 U/L (ref 38–126)
Anion gap: 5 (ref 5–15)
BUN: 7 mg/dL (ref 6–20)
CO2: 24 mmol/L (ref 22–32)
Calcium: 8.4 mg/dL — ABNORMAL LOW (ref 8.9–10.3)
Chloride: 112 mmol/L — ABNORMAL HIGH (ref 98–111)
Creatinine, Ser: 1.07 mg/dL (ref 0.61–1.24)
GFR, Estimated: >60 mL/min (ref >60)
Glucose, Bld: 107 mg/dL — ABNORMAL HIGH (ref 70–99)
Potassium: 4.2 mmol/L (ref 3.5–5.1)
Sodium: 141 mmol/L (ref 135–145)
Total Bilirubin: 0.6 mg/dL (ref 0.3–1.2)
Total Protein: 4.9 g/dL — ABNORMAL LOW (ref 6.5–8.1)

## 2021-09-07 MED ORDER — ASPIRIN 81 MG PO CHEW
81.0000 mg | CHEWABLE_TABLET | Freq: Every day | ORAL | Status: DC
  Administered 2021-09-07: 81 mg via ORAL
  Filled 2021-09-07: qty 1

## 2021-09-07 MED ORDER — CLOPIDOGREL BISULFATE 75 MG PO TABS
75.0000 mg | ORAL_TABLET | Freq: Every day | ORAL | Status: DC
  Administered 2021-09-07: 75 mg via ORAL
  Filled 2021-09-07: qty 1

## 2021-09-07 MED ORDER — ASPIRIN 81 MG PO CHEW
81.0000 mg | CHEWABLE_TABLET | Freq: Every day | ORAL | Status: DC
  Administered 2021-09-08 – 2021-09-09 (×2): 81 mg via ORAL
  Filled 2021-09-07 (×2): qty 1

## 2021-09-07 MED ORDER — CLOPIDOGREL BISULFATE 75 MG PO TABS
75.0000 mg | ORAL_TABLET | Freq: Every day | ORAL | Status: DC
  Administered 2021-09-08 – 2021-09-09 (×2): 75 mg via ORAL
  Filled 2021-09-07 (×2): qty 1

## 2021-09-07 NOTE — Progress Notes (Signed)
Inpatient Rehab Admissions Coordinator:  ? ?I spoke with pt. At bedside and pt.'s son and girlfriend over the phone to discuss potential CIR admit. They are interested and Pt. And girlfriend are able to provide 24/7 support at d/c. Pt.'s girlfriend confirmed that his medicaid is inactive and that Pt. Is uninsured. I will follow for potentia CIR admit once medically stable and work up is complete.  ? ?Clemens Catholic, MS, CCC-SLP ?Rehab Admissions Coordinator  ?[phone] (celll) ?[phone] (office) ? ?

## 2021-09-07 NOTE — Evaluation (Signed)
Physical Therapy Evaluation ?Patient Details ?Name: Mark Herrera ?MRN: [id number] ?DOB: Mar 12, 1961 ?Today's Date: 09/07/2021 ? ?History of Present Illness ? Pt is a 61 y/o male who presents 4/5 with dysarthria, left facial droop, left sided weakness and gait instability. TNK given with functional improvements noted. CT head showed early subacute infarct in the right parietal lobe. MRI pending but R MCA CVA suspected. PMH: CAD s/p stenting, cocaine abuse, strokes x 2 without residual deficit, gout and tobacco abuse ?  ?Clinical Impression ? Pt in bed upon arrival of PT, agreeable to evaluation at this time. Prior to admission the pt was independent with mobility, working in [employer]. The pt now presents with limitations in functional mobility, strength, coordination, safety and postural awareness, and dynamic stability due to above dx, and will continue to benefit from skilled PT to address these deficits. The pt was able to demo good initiation of movement for bed mobility, but progressively fatigued with sitting EOB needing increased frequency of cues then minA to maintain static sitting balance. He was able to stand x2 with mod-maxA of 2 and blocking of L knee. The pt required maxA of 2 due to strong L lean and increased coordination and strength for managing steps. Given significant levels of assist needed and pt's goal to return to independence, recommend acute inpatient rehab at d/c to maximize functional recovery. Pt with good family support at home.  ?   ?   ? ?Recommendations for follow up therapy are one component of a multi-disciplinary discharge planning process, led by the attending physician.  Recommendations may be updated based on patient status, additional functional criteria and insurance authorization. ? ?Follow Up Recommendations Acute inpatient rehab (3hours/day) ? ?  ?Assistance Recommended at Discharge Frequent or constant Supervision/Assistance  ?Patient can return home with the  following ? Two people to help with walking and/or transfers;A lot of help with bathing/dressing/bathroom;Assistance with cooking/housework;Assist for transportation;Help with stairs or ramp for entrance ? ?  ?Equipment Recommendations  (defer to further ambulation training)  ?Recommendations for Other Services ? Rehab consult  ?  ?Functional Status Assessment Patient has had a recent decline in their functional status and demonstrates the ability to make significant improvements in function in a reasonable and predictable amount of time.  ? ?  ?Precautions / Restrictions Precautions ?Precautions: Fall ?Restrictions ?Weight Bearing Restrictions: No  ? ?  ? ?Mobility ? Bed Mobility ?Overal bed mobility: Needs Assistance ?Bed Mobility: Supine to Sit ?  ?  ?Supine to sit: Min guard, HOB elevated ?  ?  ?General bed mobility comments: for safety with L lateral lean with cues to correct ?  ? ?Transfers ?Overall transfer level: Needs assistance ?Equipment used: 2 person hand held assist ?Transfers: Sit to/from Stand, Bed to chair/wheelchair/BSC ?Sit to Stand: Mod assist, +2 physical assistance, +2 safety/equipment ?  ?Step pivot transfers: Max assist, +2 physical assistance, +2 safety/equipment ?  ?  ?  ?General transfer comment: Mod A x 2 for 2 sit to stand trials with L lateral lean noted. Pt able to assist in correcting with verbal cues. Max A x 2 for stepping to recliner with L knee blocking to prevent buckling, cues for sequencing feet and posture correction ?  ? ?Ambulation/Gait ?Ambulation/Gait assistance: Max assist, +2 physical assistance ?Gait Distance (Feet): 3 Feet ?Assistive device: 2 person hand held assist ?Gait Pattern/deviations: Step-to pattern, Decreased stride length, Narrow base of support ?Gait velocity: decreased ?  ?  ?General Gait Details: pt with small lateral steps  to recliner, poor coordination of LLE steps, knee hyperextending. heavy L lean ? ? ?Modified Rankin (Stroke Patients Only) ?Modified  Rankin (Stroke Patients Only) ?Pre-Morbid Rankin Score: No symptoms ?Modified Rankin: Severe disability ? ?  ? ?Balance Overall balance assessment: Needs assistance ?Sitting-balance support: Feet supported, Single extremity supported, Bilateral upper extremity supported ?Sitting balance-Leahy Scale: Poor ?Sitting balance - Comments: initially fair with L lateral lean progressing with fatigue, able to correct with cues and min guard to Min A ?Postural control: Left lateral lean ?Standing balance support: Bilateral upper extremity supported, During functional activity ?Standing balance-Leahy Scale: Poor ?Standing balance comment: strong L lean, can correct with max cues briefly but not sustain. L knee hyperextending ?  ?  ?  ?  ?  ?  ?  ?  ?  ?  ?  ?   ? ? ? ?Pertinent Vitals/Pain Pain Assessment ?Pain Assessment: No/denies pain  ? ? ?Home Living Family/patient expects to be discharged to:: Private residence ?Living Arrangements: Other (Comment) ("a woman" (likely significant other)) ?Available Help at Discharge: Family;Available 24 hours/day ?Type of Home: Other(Comment) (boarding house) ?Home Access: Level entry ?  ?  ?  ?Home Layout: One level ?Home Equipment: Grab bars - tub/shower ?Additional Comments: Reports he has been staying with a woman in a boarding house but may go to another location soon? has 3 children that live in this area  ?  ?Prior Function Prior Level of Function : Independent/Modified Independent;Driving;Working/employed ?  ?  ?  ?  ?  ?  ?Mobility Comments: no use of AD for mobility ?ADLs Comments: Independent with ADLs, IADLs, driving and shopping. 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  ? ? ?Hand Dominance  ? Dominant Hand: Right ? ?  ?Extremity/Trunk Assessment  ? Upper Extremity Assessment ?Upper Extremity Assessment: LUE deficits/detail;Defer to OT evaluation ?LUE Deficits / Details: Noted weakness though ROM fair. 3-/5 shoulder, able to lift up to 100* though  difficult to sustain.weak grasp, wrist extensors. noted coordination deficits but reports sensation same as R UE and normal ?LUE Sensation: WNL ?LUE Coordination: decreased fine motor;decreased gross motor ?  ? ?Lower Extremity Assessment ?Lower Extremity Assessment: LLE deficits/detail ?LLE Deficits / Details: grossly 4-/5 but poor functional awareness and needing cues to reposition. pt reports sensation intact, impaired corrdination ?LLE Sensation: WNL ?LLE Coordination: decreased fine motor;decreased gross motor ?  ? ?Cervical / Trunk Assessment ?Cervical / Trunk Assessment: Other exceptions ?Cervical / Trunk Exceptions: L lateral lean and weakness, can correct briefly when cued, not sustain  ?Communication  ? Communication: No difficulties  ?Cognition Arousal/Alertness: Awake/alert ?Behavior During Therapy: [facility] for tasks assessed/performed, Impulsive ?Overall Cognitive Status: No family/caregiver present to determine baseline cognitive functioning ?  ?  ?  ?  ?  ?  ?  ?  ?  ?  ?  ?  ?  ?  ?  ?  ?General Comments: Pleasant, participatory. overwhelmed with deficits, reports not ever "being handicapped before" and tearful. responds well to encouragement, requires conistent safety cues due to minor impulsivity ?  ?  ? ?  ?General Comments General comments (skin integrity, edema, etc.): VSS on RA ? ?  ?   ? ?Assessment/Plan  ?  ?PT Assessment Patient needs continued PT services  ?PT Problem List Decreased strength;Decreased activity tolerance;Decreased balance;Decreased mobility;Decreased coordination;Decreased cognition;Decreased safety awareness ? ?   ?  ?PT Treatment Interventions DME instruction;Gait training;Stair training;Functional mobility training;Therapeutic activities;Therapeutic exercise;Balance training;Neuromuscular re-education;Patient/family education   ? ?  PT Goals (Current goals can be found in the Care Plan section)  ?Acute Rehab PT Goals ?Patient Stated Goal: to get back to work ?PT Goal  Formulation: With patient ?Time For Goal Achievement: 09/21/21 ?Potential to Achieve Goals: Good ? ?  ?Frequency Min 4X/week ?  ? ? ?Co-evaluation PT/OT/SLP Co-Evaluation/Treatment: Yes ?Reason for Co-Treatment: Complexity o

## 2021-09-07 NOTE — Progress Notes (Signed)
? ?  Inpatient Rehab Admissions Coordinator : ? ?Per therapy recommendations, patient was screened for CIR candidacy by Kalianne Fetting RN MSN.  At this time patient appears to be a potential candidate for CIR. I will place a rehab consult per protocol for full assessment. Please call me with any questions. ? ?Annelle Behrendt RN MSN ?Admissions Coordinator ?[phone] ?  ?

## 2021-09-07 NOTE — Evaluation (Signed)
Occupational Therapy Evaluation ?Patient Details ?Name: Mark Herrera ?MRN: [id number] ?DOB: [birth year] ?Today's Date: 09/07/2021 ? ? ?History of Present Illness Pt is a 61 y/o male who presents with dysarthria, left facial droop, left sided weakness and gait instability.  TNK given with functional improvements noted. CT head showed early subacute infarct in the right parietal lobe. MRI pending but R MCA CVA suspected. PMH: CAD s/p stenting, cocaine abuse, strokes x 2 without residual deficit, gout and tobacco abuse  ? ?Clinical Impression ?  ?PTA, pt lives with significant other and Independent in all ADLs, IADLs and mobility. 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 Pt presents now with diagnoses above and deficits in L sided strength, coordination, sitting/standing balance, and safety awareness. Pt overwhelmed by deficits but eager to return to independence. Currently, pt requires up to Mod A for UB ADLs, Mod A for LB ADLs seated, and Max A x 2 for pivot transfers to recliner with consistent safety/sequencing cues needed. Based on high PLOF, anticipate pt will make good progress with AIR level therapies.   ?   ? ?Recommendations for follow up therapy are one component of a multi-disciplinary discharge planning process, led by the attending physician.  Recommendations may be updated based on patient status, additional functional criteria and insurance authorization.  ? ?Follow Up Recommendations ? Acute inpatient rehab (3hours/day)  ?  ?Assistance Recommended at Discharge Frequent or constant Supervision/Assistance  ?Patient can return home with the following Two people to help with walking and/or transfers;A lot of help with bathing/dressing/bathroom;Assistance with cooking/housework;Assist for transportation;Help with stairs or ramp for entrance ? ?  ?Functional Status Assessment ? Patient has had a recent decline in their functional status and demonstrates the ability to make significant improvements in  function in a reasonable and predictable amount of time.  ?Equipment Recommendations ? Other (comment) (TBD pending progress)  ?  ?Recommendations for Other Services Rehab consult ? ? ?  ?Precautions / Restrictions Precautions ?Precautions: Fall ?Restrictions ?Weight Bearing Restrictions: No  ? ?  ? ?Mobility Bed Mobility ?Overal bed mobility: Needs Assistance ?Bed Mobility: Supine to Sit ?  ?  ?Supine to sit: Min guard, HOB elevated ?  ?  ?General bed mobility comments: for safety with L lateral lean with cues to correct ?  ? ?Transfers ?Overall transfer level: Needs assistance ?Equipment used: 2 person hand held assist ?Transfers: Sit to/from Stand, Bed to chair/wheelchair/BSC ?Sit to Stand: Mod assist, +2 physical assistance, +2 safety/equipment ?  ?  ?Step pivot transfers: Max assist, +2 physical assistance, +2 safety/equipment ?  ?  ?General transfer comment: Mod A x 2 for 2 sit to stand trials with L lateral lean noted. Pt able to assist in correcting with verbal cues. Max A x 2 for stepping to recliner with L knee blocking to prevent buckling, cues for sequencing feet and posture correction ?  ? ?  ?Balance Overall balance assessment: Needs assistance ?Sitting-balance support: Feet supported, Single extremity supported, Bilateral upper extremity supported ?Sitting balance-Leahy Scale: Poor ?Sitting balance - Comments: initially fair with L lateral lean progressing with fatigue, able to correct with cues and min guard to Min A ?Postural control: Left lateral lean ?Standing balance support: Bilateral upper extremity supported, During functional activity ?Standing balance-Leahy Scale: Poor ?  ?  ?  ?  ?  ?  ?  ?  ?  ?  ?  ?  ?   ? ?ADL either performed or assessed with clinical judgement  ? ?ADL Overall  ADL's : Needs assistance/impaired ?Eating/Feeding: Set up;Sitting ?Eating/Feeding Details (indicate cue type and reason): was noted with some coughing, talking with mouth full ?Grooming: Minimal  assistance;Sitting ?Grooming Details (indicate cue type and reason): able to reach to face with washcloth; would need assist with bimanual tasks ?Upper Body Bathing: Minimal assistance;Sitting ?  ?Lower Body Bathing: Sit to/from stand;Moderate assistance ?  ?Upper Body Dressing : Moderate assistance;Sitting ?Upper Body Dressing Details (indicate cue type and reason): to don gown around back, assist to don affected UE first ?Lower Body Dressing: Moderate assistance;Sitting/lateral leans ?Lower Body Dressing Details (indicate cue type and reason): able to reach feet bringing feet to self. will need assist in standing due to lateral leans and poor balance ?Toilet Transfer: Maximal assistance;+2 for physical assistance;+2 for safety/equipment;Stand-pivot;BSC/3in1 ?  ?Toileting- Clothing Manipulation and Hygiene: Moderate assistance;Sitting/lateral lean ?  ?  ?  ?  ?General ADL Comments: Limited by L sided weakness, coordination deficits requiring +2 assist for safe dynamic standing and transfer attempts. Educated on shoulder positioning for swelling and joint integrity, self ROM and AROM of UE, as well as AROM of LE  ? ? ? ?Vision Ability to See in Adequate Light: 1 Impaired ?Patient Visual Report: No change from baseline ?Vision Assessment?: Vision impaired- to be further tested in functional context ?Additional Comments: noted L homonymous hemianopsia per neuro note - to be further assessed  ?   ?Perception   ?  ?Praxis   ?  ? ?Pertinent Vitals/Pain Pain Assessment ?Pain Assessment: No/denies pain  ? ? ? ?Hand Dominance Right ?  ?Extremity/Trunk Assessment Upper Extremity Assessment ?Upper Extremity Assessment: LUE deficits/detail ?LUE Deficits / Details: Noted weakness though ROM fair. 3-/5 shoulder, able to lift up to 100* though difficult to sustain.weak grasp, wrist extensors. noted coordination deficits but reports sensation same as R UE and normal ?LUE Coordination: decreased fine motor;decreased gross motor ?   ?Lower Extremity Assessment ?Lower Extremity Assessment: Defer to PT evaluation ?  ?Cervical / Trunk Assessment ?Cervical / Trunk Assessment: Normal ?  ?Communication Communication ?Communication: No difficulties ?  ?Cognition Arousal/Alertness: Awake/alert ?Behavior During Therapy: [facility] for tasks assessed/performed, Impulsive ?Overall Cognitive Status: No family/caregiver present to determine baseline cognitive functioning ?  ?  ?  ?  ?  ?  ?  ?  ?  ?  ?  ?  ?  ?  ?  ?  ?General Comments: Pleasant, participatory. overwhelmed with deficits, reports not ever "being handicapped before" and tearful. responds well to encouragement, requires conistent safety cues due to minor impulsivity ?  ?  ?General Comments  VSS on RA ? ?  ?Exercises   ?  ?Shoulder Instructions    ? ? ?Home Living Family/patient expects to be discharged to:: Private residence ?Living Arrangements: Other (Comment) ("a woman" (likely significant other)) ?Available Help at Discharge: Family;Available 24 hours/day ?Type of Home: Other(Comment) (boarding house) ?Home Access: Level entry ?  ?  ?Home Layout: One level ?  ?  ?Bathroom Shower/Tub: Walk-in shower ?  ?Bathroom Toilet: Standard ?  ?  ?Home Equipment: Grab bars - tub/shower ?  ?Additional Comments: Reports he has been staying with a woman in a boarding house but may go to another location soon? has 3 children that live in this area ?  ? ?  ?Prior Functioning/Environment Prior Level of Function : Independent/Modified Independent;Driving;Working/employed ?  ?  ?  ?  ?  ?  ?Mobility Comments: no use of AD for mobility ?ADLs Comments: Independent with ADLs, IADLs, driving  and shopping. 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  ? ?  ?  ?OT Problem List: Decreased strength;Decreased activity tolerance;Impaired balance (sitting and/or standing);Decreased coordination;Decreased safety awareness;Decreased knowledge of use of DME or AE;Decreased knowledge of precautions;Impaired UE  functional use ?  ?   ?OT Treatment/Interventions: Self-care/ADL training;Therapeutic exercise;Energy conservation;DME and/or AE instruction;Therapeutic activities;Patient/family education;Balance training  ?  ?OT Goals(

## 2021-09-07 NOTE — Progress Notes (Addendum)
STROKE TEAM PROGRESS NOTE  ? ?INTERVAL HISTORY ? ?Patient is sitting up comfortably in bed.  Nobody is at the bedside ?Patient reports improved LUE and LLE strength this morning. Discussed modifiable risk factors such as cocaine use. Patient has refused MRI-Brain multiple times over the past 24 hrs.  ? ?Vitals:  ? 09/07/21 0800 09/07/21 1000 09/07/21 1100 09/07/21 1200  ?BP: 115/77 128/78 115/77 133/86  ?Pulse: 61     ?Resp: 13 20 18 20   ?Temp: 98 ?F (36.7 ?C)     ?TempSrc: Oral     ?SpO2: 99% 100% 100% 100%  ?Weight:      ? ?CBC:  ?Recent Labs  ?Lab 09/05/21 ?1816 09/05/21 ?1821 09/07/21 ?11/07/21  ?WBC 5.7  --  5.1  ?NEUTROABS 3.0  --  3.2  ?HGB 15.1 15.6 13.3  ?HCT 46.2 46.0 40.8  ?MCV 90.8  --  90.5  ?PLT 195  --  170  ? ?Basic Metabolic Panel:  ?Recent Labs  ?Lab 09/05/21 ?1816 09/05/21 ?1821 09/07/21 ?11/07/21  ?NA 136 137 141  ?K 4.1 4.0 4.2  ?CL 107 106 112*  ?CO2 22  --  24  ?GLUCOSE 111* 109* 107*  ?BUN 7 8 7   ?CREATININE 1.07 1.00 1.07  ?CALCIUM 8.0*  --  8.4*  ? ?Lipid Panel:  ?Recent Labs  ?Lab 09/06/21 ?0701  ?CHOL 159  ?TRIG 59  ?HDL 42  ?CHOLHDL 3.8  ?VLDL 12  ?LDLCALC 105*  ? ?HgbA1c:  ?Recent Labs  ?Lab 09/06/21 ?0700  ?HGBA1C 5.3  ? ?Urine Drug Screen: No results for input(s): LABOPIA, COCAINSCRNUR, LABBENZ, AMPHETMU, THCU, LABBARB in the last 168 hours.  ?Alcohol Level No results for input(s): ETH in the last 168 hours. ? ?IMAGING past 24 hours ?CT HEAD WO CONTRAST (11/06/21) ? ?Result Date: 09/06/2021 ?CLINICAL DATA:  Acute neurological deficit. Stroke suspected. TNK follow-up. EXAM: CT HEAD WITHOUT CONTRAST TECHNIQUE: Contiguous axial images were obtained from the base of the skull through the vertex without intravenous contrast. RADIATION DOSE REDUCTION: This exam was performed according to the departmental dose-optimization program which includes automated exposure control, adjustment of the mA and/or kV according to patient size and/or use of iterative reconstruction technique. COMPARISON:  CT 09/05/2021,  MRI 06/01/2020 FINDINGS: Brain: Focal area of low-attenuation with loss of gray-white junction and sulcal effacement demonstrated in the right parietal lobe consistent with early subacute infarct in the distribution of the right middle cerebral artery. Older area of encephalomalacia corresponding to old infarct in the right temporal lobe and insular region. No developing midline shift. No ventricular dilatation. No acute intracranial hemorrhage. Vascular: Mild intracranial arterial vascular calcifications. Skull: Calvarium appears intact. Sinuses/Orbits: Mucosal thickening in the paranasal sinuses. No acute air-fluid levels. Mastoid air cells are clear. Other: None. IMPRESSION: 1. Changes consistent with early subacute infarct in the right parietal lobe, representing progression from an old area of infarct seen on previous studies. MRI is recommended for further evaluation. 2. No acute intracranial hemorrhage or midline shift. Electronically Signed   By: 11/05/2021 M.D.   On: 09/06/2021 20:03  ? ?ECHOCARDIOGRAM COMPLETE ? ?Result Date: 09/06/2021 ?   ECHOCARDIOGRAM REPORT   Patient Name:   Mark Herrera Date of Exam: 09/06/2021 Medical Rec #:  [name]         Height:       68.5 in Accession #:    11/06/2021        Weight:       171.5 lb Date of Birth:  02/07/1961  BSA:          1.925 m? Patient Age:    60 years          BP:           115/73 mmHg Patient Gender: M                 HR:           54 bpm. Exam Location:  Inpatient Procedure: 2D Echo, 3D Echo, Color Doppler, Cardiac Doppler and Intracardiac            Opacification Agent Indications:    Stroke i63.9  History:        Patient has prior history of Echocardiogram examinations, most                 recent 06/01/2020. CAD; Risk Factors:Hypertension and                 Dyslipidemia.  Sonographer:    Irving Burton Senior RDCS Referring Phys: [phone] ERIC LINDZEN IMPRESSIONS  1. LV apical false tendon (normal variant) - no mural thrombus. Left ventricular  ejection fraction, by estimation, is 30 to 35%. Left ventricular ejection fraction by 3D volume is 34 %. The left ventricle has moderately decreased function. The left ventricle demonstrates global hypokinesis. The left ventricular internal cavity size was mildly dilated. Left ventricular diastolic parameters are consistent with Grade I diastolic dysfunction (impaired relaxation).  2. Right ventricular systolic function is normal. The right ventricular size is normal. Tricuspid regurgitation signal is inadequate for assessing PA pressure.  3. Left atrial size was mildly dilated.  4. The mitral valve is abnormal. Trivial mitral valve regurgitation.  5. The aortic valve is tricuspid. Aortic valve regurgitation is not visualized. Aortic valve sclerosis/calcification is present, without any evidence of aortic stenosis.  6. The inferior vena cava is normal in size with greater than 50% respiratory variability, suggesting right atrial pressure of 3 mmHg.  7. Cannot exclude a small PFO. Comparison(s): Changes from prior study are noted. 06/01/2020: LVEF 25-30%, grade 2 DD. FINDINGS  Left Ventricle: LV apical false tendon (normal variant) - no mural thrombus. Left ventricular ejection fraction, by estimation, is 30 to 35%. Left ventricular ejection fraction by 3D volume is 34 %. The left ventricle has moderately decreased function. The left ventricle demonstrates global hypokinesis. Definity contrast agent was given IV to delineate the left ventricular endocardial borders. The left ventricular internal cavity size was mildly dilated. There is no left ventricular hypertrophy. Left ventricular diastolic parameters are consistent with Grade I diastolic dysfunction (impaired relaxation). Indeterminate filling pressures. Right Ventricle: The right ventricular size is normal. No increase in right ventricular wall thickness. Right ventricular systolic function is normal. Tricuspid regurgitation signal is inadequate for assessing  PA pressure. Left Atrium: Left atrial size was mildly dilated. Right Atrium: Right atrial size was normal in size. Pericardium: There is no evidence of pericardial effusion. Mitral Valve: The mitral valve is abnormal. There is mild thickening of the anterior and posterior mitral valve leaflet(s). Trivial mitral valve regurgitation. Tricuspid Valve: The tricuspid valve is grossly normal. Tricuspid valve regurgitation is not demonstrated. Aortic Valve: The aortic valve is tricuspid. Aortic valve regurgitation is not visualized. Aortic valve sclerosis/calcification is present, without any evidence of aortic stenosis. Pulmonic Valve: The pulmonic valve was normal in structure. Pulmonic valve regurgitation is not visualized. Aorta: The aortic root and ascending aorta are structurally normal, with no evidence of dilitation. Venous: The inferior vena cava is normal in  size with greater than 50% respiratory variability, suggesting right atrial pressure of 3 mmHg. IAS/Shunts: The interatrial septum is aneurysmal. Cannot exclude a small PFO.  LEFT VENTRICLE PLAX 2D LVIDd:         5.90 cm         Diastology LVIDs:         4.80 cm         LV e' medial:    7.07 cm/s LV PW:         0.80 cm         LV E/e' medial:  9.3 LV IVS:        0.80 cm         LV e' lateral:   5.33 cm/s LVOT diam:     2.30 cm         LV E/e' lateral: 12.4 LV SV:         83 LV SV Index:   43 LVOT Area:     4.15 cm?        3D Volume EF                                LV 3D EF:    Left                                             ventricul                                             ar                                             ejection                                             fraction                                             by 3D                                             volume is                                             34 %.                                 3D Volume EF:  3D EF:        34 %                                 LV EDV:       174 ml                                LV ESV:       115 ml                                LV SV:        59 ml RIGHT VENTRICLE RV S prime:     13.50 cm/s TAPSE (M-mode): 2.4 cm LEFT ATRIUM

## 2021-09-07 NOTE — Progress Notes (Signed)
Attached patient refused CT Scan and MRI follow up scans on 4/6 shifts from 0700-1900. Physician notified. The patient consented to follow-up Ct-scan at 1900 on 4/and imaging was completed. ?

## 2021-09-07 NOTE — Progress Notes (Signed)
OT Cancellation Note ? ?Patient Details ?Name: Mark Herrera ?MRN: [id number] ?DOB: 18-Feb-1961 ? ? ?Cancelled Treatment:    Reason Eval/Treat Not Completed: Active bedrest order Strict bedrest orders still active. Working to communicate with medical team for OOB activity clearance.  ? ?Lorre Munroe ?09/07/2021, 6:52 AM ?

## 2021-09-08 ENCOUNTER — Inpatient Hospital Stay (HOSPITAL_COMMUNITY): Payer: Self-pay

## 2021-09-08 NOTE — Progress Notes (Signed)
STROKE TEAM PROGRESS NOTE  ? ?INTERVAL HISTORY ? ?Patient is sitting up comfortably in bed.  Nobody is at the bedside ?Patient reports improved LUE and LLE strength this morning. Discussed modifiable risk factors such as cocaine use.  He reports that he is going to move from the area and get away from influence using illicit drugs.  He now has agreed to have MRI done.  Apparent baseline after last stroke was impaired weakness of the left lower extremity and difficulty ambulating. ? ?Vitals:  ? 09/07/21 1950 09/08/21 0000 09/08/21 0400 09/08/21 0731  ?BP: 122/74 134/86 129/84 127/75  ?Pulse: 62 62 60 70  ?Resp: 20   20  ?Temp: 98.4 ?F (36.9 ?C) 98.5 ?F (36.9 ?C) 98.4 ?F (36.9 ?C) 97.9 ?F (36.6 ?C)  ?TempSrc: Oral Oral Oral   ?SpO2: 97% 98% 97% 100%  ?Weight:      ? ?CBC:  ?Recent Labs  ?Lab 09/05/21 ?1816 09/05/21 ?1821 09/07/21 ?[id number]  ?WBC 5.7  --  5.1  ?NEUTROABS 3.0  --  3.2  ?HGB 15.1 15.6 13.3  ?HCT 46.2 46.0 40.8  ?MCV 90.8  --  90.5  ?PLT 195  --  170  ? ? ?Basic Metabolic Panel:  ?Recent Labs  ?Lab 09/05/21 ?1816 09/05/21 ?1821 09/07/21 ?[id number]  ?NA 136 137 141  ?K 4.1 4.0 4.2  ?CL 107 106 112*  ?CO2 22  --  24  ?GLUCOSE 111* 109* 107*  ?BUN 7 8 7   ?CREATININE 1.07 1.00 1.07  ?CALCIUM 8.0*  --  8.4*  ? ? ?Lipid Panel:  ?Recent Labs  ?Lab 09/06/21 ?0701  ?CHOL 159  ?TRIG 59  ?HDL 42  ?CHOLHDL 3.8  ?VLDL 12  ?[place] 105*  ? ? ?HgbA1c:  ?Recent Labs  ?Lab 09/06/21 ?0700  ?HGBA1C 5.3  ? ? ?Urine Drug Screen: No results for input(s): LABOPIA, COCAINSCRNUR, LABBENZ, AMPHETMU, THCU, LABBARB in the last 168 hours.  ?Alcohol Level No results for input(s): ETH in the last 168 hours. ? ?IMAGING past 24 hours ?MR BRAIN WO CONTRAST ? ?Result Date: 09/08/2021 ?CLINICAL DATA:  Stroke workup EXAM: MRI HEAD WITHOUT CONTRAST TECHNIQUE: Multiplanar, multiecho pulse sequences of the brain and surrounding structures were obtained without intravenous contrast. COMPARISON:  Head CT and CTA from yesterday in the preceding day FINDINGS:  Brain: Acute right MCA branch infarct affecting the superficial temporal lobe and extending into the right frontal and parietal cortex, roughly following the sylvian fissure. Mild white matter involvement extending towards the lateral ventricle. Pre-existing right insular infarct. No hematoma, hydrocephalus, or collection. Mild chronic small vessel ischemic change in the hemispheric white matter and pons. Vascular: Major flow voids are preserved.  Recent CTA Skull and upper cervical spine: Normal marrow signal. C3-4 disc degeneration. Sinuses/Orbits: No significant finding IMPRESSION: 1. Acute right MCA branch infarct as described. Pre-existing right insular cortex infarct. 2. Chronic small vessel ischemia in the hemispheric white matter and pons. Electronically Signed   By: Jorje Guild M.D.   On: 09/08/2021 11:26   ? ?PHYSICAL EXAM ? ?Physical Exam  ?Constitutional: Appears well-developed and well-nourished middle-aged African-American male not in distress ?Psych: Affect appropriate to situation ?Eyes: No scleral injection ?HENT: No OP obstrucion ?MSK: no joint deformities.  ?Cardiovascular: Normal rate and regular rhythm.  ?Respiratory: Effort normal, non-labored breathing ?GI: Soft.  No distension. There is no tenderness.  ?Skin: WDI ? ?Neuro: ?Mental Status: ?Patient is awake, alert, oriented to person, place, month, year, and situation. ?Cranial Nerves: ?II: Pupils are equal, round,  and reactive to light.  Visual fields intact today. ?III,IV, VI: EOMI without ptosis or diploplia.  ?V: untested ?VII: L sided facial droop, mild ?VIII: Hearing is intact to voice ?X: untested ?XI: Shoulder shrug is symmetric. ?XII: Tongue protrudes midline without atrophy or fasciculations.  ?Motor and sensory: ?Tone is normal. Bulk is normal. L arm drift; mild drift left leg.  Left foot tapping mildly with impaired strength dorsiflexion is 4+/5.  Hip flexion 5/5.  Left upper extremity: Deltoid, tricep and biceps 5/5 but hand  movements 3/5.  Markedly impaired finger tapping and hand dexterity.  Right upper extremity and right lower extremity are normal. ? ? 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 Code stroke called due to new deficits of dysarthria, L facial droop, Left weakness, and gait instability. LKW was 45 minutes prior to arrival. Initial NIHSS of 12. Given TNK with improvement in NIHSS to 3. He refused IR intervention. ? ?Stroke: MRI pending at this time, but likely R MCA branch stroke based on CT/CTA findings and symptoms, likely due to severe symptomatic right MCA near occlusion. patient treated with IV TNK with some improvement but refused intervention. ?Code Stroke CTH with no acute stroke ?CTA head & neck: Right proximal M2 occlusion, just distal to where the right MCA occluded on 05/31/2020, with poor perfusion of more distal right MCA branches. ?MRI: pending (patient refusing) ?2D Echo EF 30-35%, global hypokinesis, G1DD, mild LAE ?LDL 105 ?HgbA1c 5.3 ?VTE prophylaxis - SCDs ?   ?Diet  ? Diet regular Room service appropriate? Yes; Fluid consistency: Thin  ? ?No antithrombotic prior to admission, now on aspirin 81 mg daily and clopidogrel 75 mg daily (will continue for 90 days given high grade intracranial stenosis.  ?Therapy recommendations:  pending ?Disposition:  TBD ? ?Hypertension ?Home meds:  NA ?Stable ?Post TNK goal of <185/110 ?Long-term BP goal normotensive ? ?Hyperlipidemia ?Home meds:  NA ?LDL 105, goal < 70 ?Add atorvastatin 40 mg ?Continue statin at discharge ? ?Glucose control ?Home meds:  NA ?HgbA1c 5.3, goal < 7.0 ?CBGs ?Recent Labs  ?  09/05/21 ?1815 09/06/21 ?0813 09/06/21 ?2029  ?GLUCAP 106* 93 142*  ? ?  ?SSI ? ?Other Stroke Risk Factors ?Cigarette smoker, advised to stop smoking ?Substance abuse - UDS:  THC POSITIVE, Cocaine POSITIVE. Patient advised to stop using due to stroke  risk. ?Hx stroke ?Coronary artery disease ? ?Hospital day # 3 ? ? ? ?To contact Stroke Continuity provider, please refer to [email or web address] ?After hours, contact General Neurology ? ?

## 2021-09-08 NOTE — Plan of Care (Signed)
?  Problem: Education: ?Goal: Knowledge of disease or condition will improve ?Outcome: Progressing ?Goal: Knowledge of secondary prevention will improve (SELECT ALL) ?Outcome: Progressing ?  ?Problem: Coping: ?Goal: Will identify appropriate support needs ?Outcome: Progressing ?  ?Problem: Self-Care: ?Goal: Ability to participate in self-care as condition permits will improve ?Outcome: Progressing ?  ?

## 2021-09-08 NOTE — Progress Notes (Signed)
Physical Therapy Treatment ?Patient Details ?Name: Mark Herrera ?MRN: [id number] ?DOB: Jul 16, 1960 ?Today's Date: 09/08/2021 ? ? ?History of Present Illness Pt is a 61 y/o male who presents 4/5 with dysarthria, left facial droop, left sided weakness and gait instability. TNK given with functional improvements noted. CT head showed early subacute infarct in the right parietal lobe. MRI pending but R MCA CVA suspected. PMH: CAD s/p stenting, cocaine abuse, strokes x 2 without residual deficit, gout and tobacco abuse ? ?  ?PT Comments  ? ? Pt is progressing well towards goals. Able to mobilize LLE well with ther ex. LLE buckling with standing and transfer to recliner chair requiring assist at knee to prevent buckling. Pt very unsteady in standing requiring +2 assist for safety. Pt very pleasant and motivated to work with therapy. Current plan remains appropriate. Will continue to follow acutely.  ?  ?Recommendations for follow up therapy are one component of a multi-disciplinary discharge planning process, led by the attending physician.  Recommendations may be updated based on patient status, additional functional criteria and insurance authorization. ? ?Follow Up Recommendations ? Acute inpatient rehab (3hours/day) ?  ?  ?Assistance Recommended at Discharge Frequent or constant Supervision/Assistance  ?Patient can return home with the following Two people to help with walking and/or transfers;A lot of help with bathing/dressing/bathroom;Assistance with cooking/housework;Assist for transportation;Help with stairs or ramp for entrance ?  ?Equipment Recommendations ?  (defer to further ambulation training)  ?  ?Recommendations for Other Services Rehab consult ? ? ?  ?Precautions / Restrictions Precautions ?Precautions: Fall ?Restrictions ?Weight Bearing Restrictions: No  ?  ? ?Mobility ? Bed Mobility ?Overal bed mobility: Needs Assistance ?Bed Mobility: Supine to Sit ?  ?  ?Supine to sit: Min guard, HOB elevated ?  ?   ?General bed mobility comments: for safety with L lateral lean with cues to correct ?  ? ?Transfers ?Overall transfer level: Needs assistance ?Equipment used: 2 person hand held assist ?Transfers: Sit to/from Stand, Bed to chair/wheelchair/BSC ?Sit to Stand: Mod assist, +2 physical assistance, +2 safety/equipment, Min assist ?  ?Step pivot transfers: +2 physical assistance, +2 safety/equipment, Mod assist ?  ?  ?  ?General transfer comment: Good power up, physical assist required for balance once standing with hands off armrests. ?  ? ?Ambulation/Gait ?  ?  ?  ?  ?  ?  ?  ?General Gait Details: unsafe to attempt today with PTA and nurse tech. Will try again with rehab tech. ? ? ?Stairs ?  ?  ?  ?  ?  ? ? ?Wheelchair Mobility ?  ? ?Modified Rankin (Stroke Patients Only) ?Modified Rankin (Stroke Patients Only) ?Pre-Morbid Rankin Score: No symptoms ?Modified Rankin: Severe disability ? ? ?  ?Balance Overall balance assessment: Needs assistance ?Sitting-balance support: Feet supported, Single extremity supported, Bilateral upper extremity supported ?Sitting balance-Leahy Scale: Poor ?Sitting balance - Comments: initially fair with L lateral lean progressing with fatigue, able to correct with cues and min guard to Min A ?Postural control: Left lateral lean ?Standing balance support: Bilateral upper extremity supported, During functional activity ?Standing balance-Leahy Scale: Poor ?Standing balance comment: strong L lean, can correct with max cues briefly but not sustain. L knee hyperextending ?  ?  ?  ?  ?  ?  ?  ?  ?  ?  ?  ?  ? ?  ?Cognition Arousal/Alertness: Awake/alert ?Behavior During Therapy: [facility] for tasks assessed/performed, Impulsive ?Overall Cognitive Status: No family/caregiver present to determine baseline cognitive functioning ?  ?  ?  ?  ?  ?  ?  ?  ?  ?  ?  ?  ?  ?  ?  ?  ?  General Comments: Pleasant, participatory. overwhelmed with deficits, reports not ever "being handicapped before" and tearful.  responds well to encouragement, requires conistent safety cues due to minor impulsivity. Chair alarm is a must. ?  ?  ? ?  ?Exercises Other Exercises ?Other Exercises: repeat sit<>stand 5x with mod-min A +1. Required less assist after each attempt. Good power up but difficutly progressing UEs off armrests. ? ?  ?General Comments   ?  ?  ? ?Pertinent Vitals/Pain Pain Assessment ?Pain Assessment: No/denies pain  ? ? ?Home Living   ?  ?  ?  ?  ?  ?  ?  ?  ?  ?   ?  ?Prior Function    ?  ?  ?   ? ?PT Goals (current goals can now be found in the care plan section) Acute Rehab PT Goals ?Patient Stated Goal: to get back to work ?PT Goal Formulation: With patient ?Time For Goal Achievement: 09/21/21 ?Potential to Achieve Goals: Good ?Progress towards PT goals: Progressing toward goals ? ?  ?Frequency ? ? ? Min 4X/week ? ? ? ?  ?PT Plan Current plan remains appropriate  ? ? ?Co-evaluation   ?  ?  ?  ?  ? ?  ?AM-PAC PT "6 Clicks" Mobility   ?Outcome Measure ? Help needed turning from your back to your side while in a flat bed without using bedrails?: A Little ?Help needed moving from lying on your back to sitting on the side of a flat bed without using bedrails?: A Little ?Help needed moving to and from a bed to a chair (including a wheelchair)?: Total ?Help needed standing up from a chair using your arms (e.g., wheelchair or bedside chair)?: A Lot ?Help needed to walk in hospital room?: Total ?Help needed climbing 3-5 steps with a railing? : Total ?6 Click Score: 11 ? ?  ?End of Session Equipment Utilized During Treatment: Gait belt ?Activity Tolerance: Patient tolerated treatment well ?Patient left: in chair;with call bell/phone within reach;with chair alarm set ?Nurse Communication: Mobility status ?PT Visit Diagnosis: Other abnormalities of gait and mobility (R26.89);Muscle weakness (generalized) (M62.81);Hemiplegia and hemiparesis ?Hemiplegia - Right/Left: Left ?Hemiplegia - dominant/non-dominant:  Non-dominant ?Hemiplegia - caused by: Cerebral infarction ?  ? ? ?Time: 1205-1227 ?PT Time Calculation (min) (ACUTE ONLY): 22 min ? ?Charges:  $Neuromuscular Re-education: 8-22 mins          ?          ? ?Kallie Locks, PTA ?Acute Rehab ? ? ?Sheral Apley ?09/08/2021, 1:22 PM ? ?

## 2021-09-09 ENCOUNTER — Other Ambulatory Visit: Payer: Self-pay

## 2021-09-09 ENCOUNTER — Encounter (HOSPITAL_COMMUNITY): Payer: Self-pay | Admitting: Physical Medicine and Rehabilitation

## 2021-09-09 ENCOUNTER — Inpatient Hospital Stay (HOSPITAL_COMMUNITY)
Admit: 2021-09-09 | Discharge: 2021-09-20 | DRG: 057 | Disposition: A | Discharge status: Home / Self Care | Payer: Self-pay | Source: Intra-hospital | Attending: Physical Medicine and Rehabilitation | Admitting: Physical Medicine and Rehabilitation

## 2021-09-09 DIAGNOSIS — Z8249 Family history of ischemic heart disease and other diseases of the circulatory system: Secondary | ICD-10-CM

## 2021-09-09 DIAGNOSIS — F4321 Adjustment disorder with depressed mood: Secondary | ICD-10-CM | POA: Diagnosis present

## 2021-09-09 DIAGNOSIS — E8809 Other disorders of plasma-protein metabolism, not elsewhere classified: Secondary | ICD-10-CM | POA: Diagnosis present

## 2021-09-09 DIAGNOSIS — M109 Gout, unspecified: Secondary | ICD-10-CM | POA: Diagnosis present

## 2021-09-09 DIAGNOSIS — E785 Hyperlipidemia, unspecified: Secondary | ICD-10-CM

## 2021-09-09 DIAGNOSIS — R4587 Impulsiveness: Secondary | ICD-10-CM | POA: Diagnosis present

## 2021-09-09 DIAGNOSIS — Z955 Presence of coronary angioplasty implant and graft: Secondary | ICD-10-CM

## 2021-09-09 DIAGNOSIS — I63511 Cerebral infarction due to unspecified occlusion or stenosis of right middle cerebral artery: Secondary | ICD-10-CM

## 2021-09-09 DIAGNOSIS — R001 Bradycardia, unspecified: Secondary | ICD-10-CM

## 2021-09-09 DIAGNOSIS — F17210 Nicotine dependence, cigarettes, uncomplicated: Secondary | ICD-10-CM | POA: Diagnosis present

## 2021-09-09 DIAGNOSIS — K029 Dental caries, unspecified: Secondary | ICD-10-CM | POA: Diagnosis present

## 2021-09-09 DIAGNOSIS — R739 Hyperglycemia, unspecified: Secondary | ICD-10-CM | POA: Diagnosis present

## 2021-09-09 DIAGNOSIS — I69354 Hemiplegia and hemiparesis following cerebral infarction affecting left non-dominant side: Principal | ICD-10-CM

## 2021-09-09 DIAGNOSIS — F1910 Other psychoactive substance abuse, uncomplicated: Secondary | ICD-10-CM

## 2021-09-09 DIAGNOSIS — Z7902 Long term (current) use of antithrombotics/antiplatelets: Secondary | ICD-10-CM

## 2021-09-09 DIAGNOSIS — I10 Essential (primary) hypertension: Secondary | ICD-10-CM | POA: Diagnosis present

## 2021-09-09 DIAGNOSIS — Z7982 Long term (current) use of aspirin: Secondary | ICD-10-CM

## 2021-09-09 DIAGNOSIS — I69322 Dysarthria following cerebral infarction: Secondary | ICD-10-CM

## 2021-09-09 DIAGNOSIS — I639 Cerebral infarction, unspecified: Secondary | ICD-10-CM | POA: Diagnosis present

## 2021-09-09 DIAGNOSIS — I69392 Facial weakness following cerebral infarction: Secondary | ICD-10-CM

## 2021-09-09 DIAGNOSIS — F1290 Cannabis use, unspecified, uncomplicated: Secondary | ICD-10-CM | POA: Diagnosis present

## 2021-09-09 DIAGNOSIS — Z79899 Other long term (current) drug therapy: Secondary | ICD-10-CM

## 2021-09-09 DIAGNOSIS — Z7151 Drug abuse counseling and surveillance of drug abuser: Secondary | ICD-10-CM

## 2021-09-09 DIAGNOSIS — F1490 Cocaine use, unspecified, uncomplicated: Secondary | ICD-10-CM | POA: Diagnosis present

## 2021-09-09 DIAGNOSIS — I2510 Atherosclerotic heart disease of native coronary artery without angina pectoris: Secondary | ICD-10-CM | POA: Diagnosis present

## 2021-09-09 DIAGNOSIS — Z8673 Personal history of transient ischemic attack (TIA), and cerebral infarction without residual deficits: Secondary | ICD-10-CM

## 2021-09-09 DIAGNOSIS — R32 Unspecified urinary incontinence: Secondary | ICD-10-CM | POA: Diagnosis present

## 2021-09-09 DIAGNOSIS — I252 Old myocardial infarction: Secondary | ICD-10-CM

## 2021-09-09 DIAGNOSIS — Z716 Tobacco abuse counseling: Secondary | ICD-10-CM

## 2021-09-09 MED ORDER — PANTOPRAZOLE SODIUM 40 MG IV SOLR: 40.0000 mg | Freq: Every day | INTRAVENOUS | Status: DC

## 2021-09-09 MED ORDER — ACETAMINOPHEN 160 MG/5ML PO SOLN: 650.0000 mg | ORAL | Status: DC | PRN

## 2021-09-09 MED ORDER — ATORVASTATIN CALCIUM 40 MG PO TABS: 40.0000 mg | ORAL_TABLET | Freq: Every day | ORAL | 0 refills | Status: DC

## 2021-09-09 MED ORDER — ACETAMINOPHEN 650 MG RE SUPP: 650.0000 mg | RECTAL | Status: DC | PRN

## 2021-09-09 MED ORDER — ASPIRIN 81 MG PO CHEW
81.0000 mg | CHEWABLE_TABLET | Freq: Every day | ORAL | Status: DC
  Administered 2021-09-10 – 2021-09-11 (×2): 81 mg via ORAL
  Filled 2021-09-09 (×2): qty 1

## 2021-09-09 MED ORDER — CHLORHEXIDINE GLUCONATE CLOTH 2 % EX PADS: 6.0000 | MEDICATED_PAD | Freq: Every day | CUTANEOUS | Status: DC

## 2021-09-09 MED ORDER — ATORVASTATIN CALCIUM 40 MG PO TABS
40.0000 mg | ORAL_TABLET | Freq: Every day | ORAL | Status: DC
  Administered 2021-09-10 – 2021-09-20 (×11): 40 mg via ORAL
  Filled 2021-09-09 (×11): qty 1

## 2021-09-09 MED ORDER — SENNOSIDES-DOCUSATE SODIUM 8.6-50 MG PO TABS: 1.0000 | ORAL_TABLET | Freq: Every evening | ORAL | Status: DC | PRN

## 2021-09-09 MED ORDER — CLOPIDOGREL BISULFATE 75 MG PO TABS
75.0000 mg | ORAL_TABLET | Freq: Every day | ORAL | 2 refills | Status: DC
Start: 2021-09-10 — End: 2021-09-19

## 2021-09-09 MED ORDER — ACETAMINOPHEN 325 MG PO TABS
650.0000 mg | ORAL_TABLET | ORAL | Status: DC | PRN
  Administered 2021-09-12 – 2021-09-18 (×6): 650 mg via ORAL
  Filled 2021-09-09 (×7): qty 2

## 2021-09-09 MED ORDER — CLOPIDOGREL BISULFATE 75 MG PO TABS
75.0000 mg | ORAL_TABLET | Freq: Every day | ORAL | Status: DC
  Administered 2021-09-10 – 2021-09-20 (×11): 75 mg via ORAL
  Filled 2021-09-09 (×11): qty 1

## 2021-09-09 MED ORDER — ASPIRIN 81 MG PO CHEW: 81.0000 mg | CHEWABLE_TABLET | Freq: Every day | ORAL | 0 refills | Status: AC

## 2021-09-09 NOTE — Progress Notes (Signed)
Inpatient Rehab Admissions Coordinator:    I have a CIR bed for this pt. Today. RN may call report to [phone].  Edeline Greening, MS, CCC-SLP Rehab Admissions Coordinator  [phone] (celll) [phone] (office)  

## 2021-09-09 NOTE — Plan of Care (Signed)

## 2021-09-09 NOTE — Progress Notes (Addendum)
PMR Admission Coordinator Pre-Admission Assessment ?  ?Patient: Mark Herrera is an 61 y.o., male ?MRN: [id number] ?DOB: [birth year] ?Height:   ?Weight: 77.8 kg ?  ?Insurance Information ?HMO:     PPO:      PCP:      IPA:      80/20:      OTHER:  ?  ?PRIMARY: Uninsured  ?  ?SECONDARY:       Policy#:      Phone#:  ?  ?Financial Counselor:       Phone#:  ?  ?The ?Data Collection Information Summary? for patients in Inpatient Rehabilitation Facilities with attached ?Privacy Act Jay Records? was provided and verbally reviewed with: Patient ?  ?Emergency Contact Information ?Contact Information   ?  ?  Name Relation Home Work Mobile  ?  Debby Freiberg Son [phone]   [phone]  ?  Janalyn Rouse Daughter [phone]   [phone]  ?  ?   ?  ?  ?Current Medical History  ?Patient Admitting Diagnosis: CVA                   ?History of Present Illness: Mark Herrera is an 61 y.o. male with a PMHx of CAD s/p stenting, cocaine abuse, strokes x 2 without residual deficit, gout and tobacco abuse presenting to the ED as a Code Stroke via EMS after girlfriend noticed sudden onset of dysarthria, left facial droop, left sided weakness and gait instability. Pt. Admitted as code stroke 09/05/21. Vitals per EMS: 128/80 and CBG 118. On arrival to the ED the patient continued to be weak on the left side. CT head showed no acute intracranial process. ASPECTS is 10. Expected evolution of previously noted infarcts in the right insula and posterior right temporal lobe, with encephalomalacia.  CTA of head and neck: Right proximal M2 occlusion, just distal to where the right MCA occluded on 05/31/2020, with poor perfusion of more distal right MCA branches. No additional hemodynamically significant intracranial stenosis. No hemodynamically significant stenosis in the neck. Pt. Was administered TNK on admission. Initially declined thrombectomy and MRI but consented to MRI 4/8 with results as follows. Acute right  MCA branch infarct as described. Pre-existing right insular cortex infarct,  Chronic small vessel ischemia in the hemispheric white matter and ?pons. PT/OT/SLP consulted and recommended CIR to assist in return to PLOF.  ?  ?  ?Complete NIHSS TOTAL: 3 ?  ?Patient's medical record from [facility] has been reviewed by the rehabilitation admission coordinator and physician. ?  ?Past Medical History  ?    ?Past Medical History:  ?Diagnosis Date  ? CAD (coronary artery disease)    ?  10/08/16 STEMI DES x1 to pLAD EF 45%  ? Cocaine abuse ([place])    ? CVA (cerebral vascular accident) [facility])    ? Gout    ? Tobacco abuse    ?  ?  ?Has the patient had major surgery during 100 days prior to admission? No ?  ?Family History   ?family history includes Hypertension in his father. ?  ?Current Medications ?  ?Current Facility-Administered Medications:  ?   stroke: mapping our early stages of recovery book, , Does not apply, Once, Kerney Elbe, MD ?  0.9 %  sodium chloride infusion, , Intravenous, Once, Garvin Fila, MD ?  acetaminophen (TYLENOL) tablet 650 mg, 650 mg, Oral, Q4H PRN, 650 mg at 09/08/21 1536 **OR** acetaminophen (TYLENOL) 160 MG/5ML solution 650 mg, 650 mg, Per Tube, Q4H  PRN **OR** acetaminophen (TYLENOL) suppository 650 mg, 650 mg, Rectal, Q4H PRN, Kerney Elbe, MD ?  aspirin chewable tablet 81 mg, 81 mg, Oral, Daily, Corky Sox, MD, 81 mg at 09/09/21 [id number] ?  atorvastatin (LIPITOR) tablet 40 mg, 40 mg, Oral, Daily, Corky Sox, MD, 40 mg at 09/09/21 0806 ?  Chlorhexidine Gluconate Cloth 2 % PADS 6 each, 6 each, Topical, Daily, Kerney Elbe, MD, 6 each at 09/08/21 [phone] ?  clopidogrel (PLAVIX) tablet 75 mg, 75 mg, Oral, Daily, Corky Sox, MD, 75 mg at 09/09/21 0806 ?  pantoprazole (PROTONIX) injection 40 mg, 40 mg, Intravenous, QHS, Kerney Elbe, MD, 40 mg at 09/08/21 2326 ?  senna-docusate (Senokot-S) tablet 1 tablet, 1 tablet, Oral, QHS PRN, Kerney Elbe, MD ?  sodium chloride flush  (NS) 0.9 % injection 3 mL, 3 mL, Intravenous, Once, Godfrey Pick, MD ?  ?Patients Current Diet:  ?Diet Order   ?  ?         ?    Diet regular Room service appropriate? Yes; Fluid consistency: Thin  Diet effective now       ?  ?  ?   ?  ?  ?   ?  ?  ?Precautions / Restrictions ?Precautions ?Precautions: Fall ?Restrictions ?Weight Bearing Restrictions: No  ?  ?Has the patient had 2 or more falls or a fall with injury in the past year? No ?  ?Prior Activity Level ?Community (5-7x/wk): Pt. was active in the community PTA ?  ?Prior Functional Level ?Self Care: Did the patient need help bathing, dressing, using the toilet or eating? Needed some help ?  ?Indoor Mobility: Did the patient need assistance with walking from room to room (with or without device)? Needed some help ?  ?Stairs: Did the patient need assistance with internal or external stairs (with or without device)? Independent ?  ?Functional Cognition: Did the patient need help planning regular tasks such as shopping or remembering to take medications? Independent ?  ?Patient Information ?Are you of Hispanic, Latino/a,or Spanish origin?: A. No, not of Hispanic, Latino/a, or Spanish origin ?What is your race?: B. Black or African American ?Do you need or want an interpreter to communicate with a doctor or health care staff?: 0. No ?  ?Patient's Response To:  ?Health Literacy and Transportation ?Is the patient able to respond to health literacy and transportation needs?: Yes ?Health Literacy - How often do you need to have someone help you when you read instructions, pamphlets, or other written material from your doctor or pharmacy?: Never ?In the past 12 months, has lack of transportation kept you from medical appointments or from getting medications?: Yes ?In the past 12 months, has lack of transportation kept you from meetings, work, or from getting things needed for daily living?: Yes ?  ?Home Assistive Devices / Equipment ?Home Equipment: Grab bars -  tub/shower ?  ?Prior Device Use: Indicate devices/aids used by the patient prior to current illness, exacerbation or injury? None of the above ?  ?Current Functional Level ?Cognition ?  Arousal/Alertness: Lethargic ?Overall Cognitive Status: No family/caregiver present to determine baseline cognitive functioning ?Orientation Level: Oriented X4 ?General Comments: Pleasant, participatory. overwhelmed with deficits, reports not ever "being handicapped before" and tearful. responds well to encouragement, requires conistent safety cues due to minor impulsivity. Chair alarm is a must. ?Attention: Focused ?Focused Attention: Appears intact ?Memory: Impaired ?Memory Impairment: Storage deficit, Decreased recall of new information, Decreased short term memory ?Decreased Short Term Memory: Verbal basic, Functional basic ?  Awareness: Appears intact ?Problem Solving: Appears intact ?Safety/Judgment: Appears intact ?   ?Extremity Assessment ?(includes Sensation/Coordination) ?  Upper Extremity Assessment: LUE deficits/detail, Defer to OT evaluation ?LUE Deficits / Details: Noted weakness though ROM fair. 3-/5 shoulder, able to lift up to 100* though difficult to sustain.weak grasp, wrist extensors. noted coordination deficits but reports sensation same as R UE and normal ?LUE Sensation: WNL ?LUE Coordination: decreased fine motor, decreased gross motor  ?Lower Extremity Assessment: LLE deficits/detail ?LLE Deficits / Details: grossly 4-/5 but poor functional awareness and needing cues to reposition. pt reports sensation intact, impaired corrdination ?LLE Sensation: WNL ?LLE Coordination: decreased fine motor, decreased gross motor  ?   ?ADLs ?  Overall ADL's : Needs assistance/impaired ?Eating/Feeding: Set up, Sitting ?Eating/Feeding Details (indicate cue type and reason): was noted with some coughing, talking with mouth full ?Grooming: Minimal assistance, Sitting ?Grooming Details (indicate cue type and reason): able to reach to  face with washcloth; would need assist with bimanual tasks ?Upper Body Bathing: Minimal assistance, Sitting ?Lower Body Bathing: Sit to/from stand, Moderate assistance ?Upper Body Dressing : Moderate assistan

## 2021-09-09 NOTE — Discharge Summary (Signed)
Stroke Discharge Summary  ?Patient ID: Mark Herrera    l ?  MRN: [id number]    ?  DOB: August 23, 1960 ? ?Date of Admission: 09/05/2021 ?Date of Discharge: 09/09/2021 ? ?Attending Physician:  Dr. Merlene Laughter ?Consultant(s):   None ?Patient's PCP:  Pcp, No ? ?Discharge Diagnoses:  ?Principal Problem: ?  Stroke (cerebrum) ([place]) ? ? ?Medications to be continued on Rehab ?Allergies as of 09/09/2021   ?No Known Allergies ?  ? ?  ?Medication List  ?  ? ?TAKE these medications   ? ?aspirin 81 MG chewable tablet ?Chew 1 tablet (81 mg total) by mouth daily. ?Start taking on: September 10, 2021 ?  ?atorvastatin 40 MG tablet ?Commonly known as: LIPITOR ?Take 1 tablet (40 mg total) by mouth daily. ?Start taking on: September 10, 2021 ?  ?clopidogrel 75 MG tablet ?Commonly known as: PLAVIX ?Take 1 tablet (75 mg total) by mouth daily. ?Start taking on: September 10, 2021 ?  ? ?  ? ? ?LABORATORY STUDIES ?CBC ?   ?Component Value Date/Time  ? WBC 5.1 09/07/2021 0624  ? RBC 4.51 09/07/2021 0624  ? HGB 13.3 09/07/2021 0624  ? HCT 40.8 09/07/2021 0624  ? PLT 170 09/07/2021 0624  ? MCV 90.5 09/07/2021 0624  ? MCH 29.5 09/07/2021 0624  ? MCHC 32.6 09/07/2021 0624  ? RDW 13.6 09/07/2021 0624  ? LYMPHSABS 1.1 09/07/2021 0624  ? MONOABS 0.5 09/07/2021 0624  ? EOSABS 0.3 09/07/2021 0624  ? BASOSABS 0.0 09/07/2021 0624  ? ?CMP ?   ?Component Value Date/Time  ? NA 141 09/07/2021 0624  ? K 4.2 09/07/2021 0624  ? CL 112 (H) 09/07/2021 [id number]  ? CO2 24 09/07/2021 0624  ? GLUCOSE 107 (H) 09/07/2021 [id number]  ? BUN 7 09/07/2021 0624  ? CREATININE 1.07 09/07/2021 0624  ? CALCIUM 8.4 (L) 09/07/2021 [id number]  ? PROT 4.9 (L) 09/07/2021 [id number]  ? ALBUMIN 2.6 (L) 09/07/2021 [id number]  ? AST 15 09/07/2021 0624  ? ALT 13 09/07/2021 0624  ? ALKPHOS 58 09/07/2021 0624  ? BILITOT 0.6 09/07/2021 0624  ? GFRNONAA >60 09/07/2021 0624  ? GFRAA >60 06/01/2019 0553  ? ?COAGS ?Lab Results  ?Component Value Date  ? INR 1.1 09/05/2021  ? INR 1.1 05/31/2020  ? INR 1.1 05/31/2019  ? ?Lipid Panel ?    ?Component Value Date/Time  ? CHOL 159 09/06/2021 0701  ? TRIG 59 09/06/2021 0701  ? HDL 42 09/06/2021 0701  ? CHOLHDL 3.8 09/06/2021 0701  ? VLDL 12 09/06/2021 0701  ? [place] 105 (H) 09/06/2021 0701  ? ?HgbA1C  ?Lab Results  ?Component Value Date  ? HGBA1C 5.3 09/06/2021  ? ?Urinalysis ?   ?Component Value Date/Time  ? COLORURINE STRAW (A) 05/31/2020 1417  ? APPEARANCEUR CLEAR 05/31/2020 1417  ? LABSPEC 1.006 05/31/2020 1417  ? PHURINE 7.0 05/31/2020 1417  ? GLUCOSEU NEGATIVE 05/31/2020 1417  ? HGBUR SMALL (A) 05/31/2020 1417  ? [place] NEGATIVE 05/31/2020 1417  ? [place] NEGATIVE 05/31/2020 1417  ? [place] NEGATIVE 05/31/2020 1417  ? NITRITE NEGATIVE 05/31/2020 1417  ? LEUKOCYTESUR NEGATIVE 05/31/2020 1417  ? ?Urine Drug Screen  ?   ?Component Value Date/Time  ? [place] DETECTED 05/31/2020 1343  ? COCAINSCRNUR POSITIVE (A) 05/31/2020 1343  ? [place] DETECTED 05/31/2020 1343  ? AMPHETMU NONE DETECTED 05/31/2020 1343  ? THCU POSITIVE (A) 05/31/2020 1343  ? [place] DETECTED 05/31/2020 1343  ?  ?Alcohol Level ?   ?Component Value Date/Time  ? ETH <10  05/31/2019 0835  ? ? ? ?SIGNIFICANT DIAGNOSTIC STUDIES ?CT HEAD WO CONTRAST (5MM) ? ?Result Date: 09/06/2021 ?CLINICAL DATA:  Acute neurological deficit. Stroke suspected. TNK follow-up. EXAM: CT HEAD WITHOUT CONTRAST TECHNIQUE: Contiguous axial images were obtained from the base of the skull through the vertex without intravenous contrast. RADIATION DOSE REDUCTION: This exam was performed according to the departmental dose-optimization program which includes automated exposure control, adjustment of the mA and/or kV according to patient size and/or use of iterative reconstruction technique. COMPARISON:  CT 09/05/2021, MRI 06/01/2020 FINDINGS: Brain: Focal area of low-attenuation with loss of gray-white junction and sulcal effacement demonstrated in the right parietal lobe consistent with early subacute infarct in the distribution of the right  middle cerebral artery. Older area of encephalomalacia corresponding to old infarct in the right temporal lobe and insular region. No developing midline shift. No ventricular dilatation. No acute intracranial hemorrhage. Vascular: Mild intracranial arterial vascular calcifications. Skull: Calvarium appears intact. Sinuses/Orbits: Mucosal thickening in the paranasal sinuses. No acute air-fluid levels. Mastoid air cells are clear. Other: None. IMPRESSION: 1. Changes consistent with early subacute infarct in the right parietal lobe, representing progression from an old area of infarct seen on previous studies. MRI is recommended for further evaluation. 2. No acute intracranial hemorrhage or midline shift. Electronically Signed   By: Lucienne Capers M.D.   On: 09/06/2021 20:03  ? ?MR BRAIN WO CONTRAST ? ?Result Date: 09/08/2021 ?CLINICAL DATA:  Stroke workup EXAM: MRI HEAD WITHOUT CONTRAST TECHNIQUE: Multiplanar, multiecho pulse sequences of the brain and surrounding structures were obtained without intravenous contrast. COMPARISON:  Head CT and CTA from yesterday in the preceding day FINDINGS: Brain: Acute right MCA branch infarct affecting the superficial temporal lobe and extending into the right frontal and parietal cortex, roughly following the sylvian fissure. Mild white matter involvement extending towards the lateral ventricle. Pre-existing right insular infarct. No hematoma, hydrocephalus, or collection. Mild chronic small vessel ischemic change in the hemispheric white matter and pons. Vascular: Major flow voids are preserved.  Recent CTA Skull and upper cervical spine: Normal marrow signal. C3-4 disc degeneration. Sinuses/Orbits: No significant finding IMPRESSION: 1. Acute right MCA branch infarct as described. Pre-existing right insular cortex infarct. 2. Chronic small vessel ischemia in the hemispheric white matter and pons. Electronically Signed   By: Jorje Guild M.D.   On: 09/08/2021 11:26   ? ?ECHOCARDIOGRAM COMPLETE ? ?Result Date: 09/06/2021 ?   ECHOCARDIOGRAM REPORT   Patient Name:   Mark Herrera Date of Exam: 09/06/2021 Medical Rec #:  [id number]         Height:       68.5 in Accession #:    [id number]        Weight:       171.5 lb Date of Birth:  [birth year]          BSA:          1.925 m? Patient Age:    61 years          BP:           115/73 mmHg Patient Gender: M                 HR:           54 bpm. Exam Location:  Inpatient Procedure: 2D Echo, 3D Echo, Color Doppler, Cardiac Doppler and Intracardiac            Opacification Agent Indications:    Stroke i63.9  History:  Patient has prior history of Echocardiogram examinations, most                 recent 06/01/2020. CAD; Risk Factors:Hypertension and                 Dyslipidemia.  Sonographer:    Raquel Sarna Senior RDCS Referring Phys: [phone] ERIC LINDZEN IMPRESSIONS  1. LV apical false tendon (normal variant) - no mural thrombus. Left ventricular ejection fraction, by estimation, is 30 to 35%. Left ventricular ejection fraction by 3D volume is 34 %. The left ventricle has moderately decreased function. The left ventricle demonstrates global hypokinesis. The left ventricular internal cavity size was mildly dilated. Left ventricular diastolic parameters are consistent with Grade I diastolic dysfunction (impaired relaxation).  2. Right ventricular systolic function is normal. The right ventricular size is normal. Tricuspid regurgitation signal is inadequate for assessing PA pressure.  3. Left atrial size was mildly dilated.  4. The mitral valve is abnormal. Trivial mitral valve regurgitation.  5. The aortic valve is tricuspid. Aortic valve regurgitation is not visualized. Aortic valve sclerosis/calcification is present, without any evidence of aortic stenosis.  6. The inferior vena cava is normal in size with greater than 50% respiratory variability, suggesting right atrial pressure of 3 mmHg.  7. Cannot exclude a small PFO. Comparison(s): Changes  from prior study are noted. 06/01/2020: LVEF 25-30%, grade 2 DD. FINDINGS  Left Ventricle: LV apical false tendon (normal variant) - no mural thrombus. Left ventricular ejection fraction, by estimation, is 30 to 35%. Left ventri

## 2021-09-09 NOTE — Evaluation (Signed)
Speech Language Pathology Assessment and Plan ? ?Patient Details  ?Name: Mark Herrera ?MRN: [id number] ?Date of Birth: 04/11/1961 ? ?SLP Diagnosis: Dysarthria  ?Rehab Potential: Excellent ?ELOS: 14-16 days  ? ? ?Today's Date: 09/10/2021 ?SLP Individual Time: 4696-2952 ?SLP Individual Time Calculation (min): 60 min ? ? ?Hospital Problem: Principal Problem: ?  CVA (cerebral vascular accident) ([place]) ? ?Past Medical History:  ?Past Medical History:  ?Diagnosis Date  ? CAD (coronary artery disease)   ? 10/08/16 STEMI DES x1 to pLAD EF 45%  ? Cocaine abuse ([place])   ? CVA (cerebral vascular accident) [facility])   ? Gout   ? Tobacco abuse   ? ?Past Surgical History:  ?Past Surgical History:  ?Procedure Laterality Date  ? CORONARY STENT INTERVENTION N/A 10/08/2016  ? Procedure: Coronary Stent Intervention;  Surgeon: Burnell Blanks, MD;  Location: [place] [facility];  Service: Cardiovascular;  Laterality: N/A;  ? IR CT HEAD LTD  05/31/2020  ? IR PERCUTANEOUS ART THROMBECTOMY/INFUSION INTRACRANIAL INC DIAG ANGIO  05/31/2020  ? IR US GUIDE VASC ACCESS RIGHT  05/31/2020  ? LEFT HEART CATH AND CORONARY ANGIOGRAPHY N/A 10/08/2016  ? Procedure: Left Heart Cath and Coronary Angiography;  Surgeon: Burnell Blanks, MD;  Location: [place] [facility];  Service: Cardiovascular;  Laterality: N/A;  ? RADIOLOGY WITH ANESTHESIA N/A 05/31/2020  ? Procedure: IR WITH ANESTHESIA;  Surgeon: Radiologist, Medication, MD;  Location: [place];  Service: Radiology;  Laterality: N/A;  ? RADIOLOGY WITH ANESTHESIA N/A 09/05/2021  ? Procedure: RADIOLOGY WITH ANESTHESIA;  Surgeon: Radiologist, Medication, MD;  Location: [place];  Service: Radiology;  Laterality: N/A;  ? TOE SURGERY    ? ? ?Assessment / Plan / Recommendation ?Clinical Impression   Mark Herrera is an 61 y.o. male with a PMHx of CAD s/p stenting, cocaine abuse, strokes x 2 without residual deficit, gout and tobacco abuse presenting to the ED as a Code Stroke via EMS after girlfriend  noticed sudden onset of dysarthria, left facial droop, left sided weakness and gait instability. Time of symptom onset was the same as LKN: 1740. Vitals per EMS: 128/80 and CBG 118. On arrival to the ED the patient continued to be weak on the left side. CT head showed no acute intracranial process. ASPECTS is 10. Expected evolution of previously noted infarcts in the right insula and posterior right temporal lobe, with encephalomalacia.  CTA of head and neck: Right proximal M2 occlusion, just distal to where the right MCA occluded on 05/31/2020, with poor perfusion of more distal right MCA branches. No additional hemodynamically significant intracranial stenosis. No hemodynamically significant stenosis in the neck. Pt. Was administered TNK on admission. Initially declined thrombectomy and MRI but consented to MRI 4/8 with results as follows. Acute right MCA branch infarct as described. Pre-existing right insular cortex infarct,  Chronic small vessel ischemia in the hemispheric white matter and ?pons. PT/OT/SLP consulted and recommended CIR to assist in return to PLOF.  ?  ?Pt presents with mild dysarthria in conversation characterized by occasional misarticulation resulting in 90% intelligibility. Pt demonstrates overall awareness of errors due to left side weakness, but does not always correct errors in the moment. Pt expresses concerns over misarticulation errors. Pt demonstrated WFL on all subsections on formal cognitive linguistic assessment CLQT. Pt did express occasional reduced safety awareness with novel deficits but appeared open to learning safety precautions. Pt denies acute changes to swallow function. ST will focus on education and utilization of speech intelligibility strategies and then  d/c from [place].Pt would benefit from skilled ST services in order to maximize functional independence and reduce burden of care, no further services at discharge. ? ?  ?Skilled Therapeutic Interventions           Skilled ST services focused on cognitive skills. SLP facilitated administration of cognitive linguistic formal assessment and provided education of results. SLP and pt collaborated to set goals for cognitive linguistic needs during length of stay. All questions answered to satisfaction.  Pt was left in room with call bell within reach and chair alarm set. SLP recommends to continue skilled services.  ?  ?SLP Assessment ? Patient will need skilled Speech Lanaguage Pathology Services during CIR admission  ?  ?Recommendations ? Recommendations for Other Services: Neuropsych consult;Therapeutic Recreation consult ?Therapeutic Recreation Interventions: Outing/community reintergration;Pet therapy ?Patient destination: Home ?Follow up Recommendations: None ?Equipment Recommended: None recommended by SLP  ?  ?SLP Frequency 1 to 3 out of 7 days   ?SLP Duration ? ?SLP Intensity ? ?SLP Treatment/Interventions 14-16 days ? ?Minumum of 1-2 x/day, 30 to 90 minutes ? ?Speech/Language facilitation;Internal/external aids   ? ?Pain ?Pain Assessment ?Pain Score: 0-No pain ? ?Prior Functioning ?Cognitive/Linguistic Baseline: Within functional limits ?Type of Home: Other(Comment) ? Lives With: Significant other;Daughter ?Available Help at Discharge: Family;Available 24 hours/day ?Vocation: Full time employment ? ?SLP Evaluation ?Cognition ?Overall Cognitive Status: Within Functional Limits for tasks assessed ?Arousal/Alertness: Awake/alert ?Orientation Level: Oriented X4 ?Attention: Alternating ?Focused Attention: Appears intact ?Alternating Attention: Appears intact ?Memory: Appears intact ?Awareness: Appears intact ?Problem Solving: Appears intact ?Safety/Judgment: Impaired  ?Comprehension ?Auditory Comprehension ?Overall Auditory Comprehension: Appears within functional limits for tasks assessed ?Yes/No Questions: Within Functional Limits ?Commands: Within Functional Limits ?Expression ?Expression ?Primary Mode of Expression:  Verbal ?Verbal Expression ?Overall Verbal Expression: Appears within functional limits for tasks assessed ?Oral Motor ?Oral Motor/Sensory Function ?Overall Oral Motor/Sensory Function: Within functional limits ?Motor Speech ?Overall Motor Speech: Impaired ?Respiration: Within functional limits ?Phonation: Normal ?Resonance: Within functional limits ?Articulation: Impaired ?Level of Impairment: Conversation ?Intelligibility: Intelligible ?Motor Planning: Witnin functional limits ?Motor Speech Errors: Not applicable ? ?Care Tool ?Care Tool Cognition ?Ability to hear (with hearing aid or hearing appliances if normally used Ability to hear (with hearing aid or hearing appliances if normally used): 0. Adequate - no difficulty in normal conservation, social interaction, listening to TV ?  ?Expression of Ideas and Wants Expression of Ideas and Wants: 3. Some difficulty - exhibits some difficulty with expressing needs and ideas (e.g, some words or finishing thoughts) or speech is not clear ?  ?Understanding Verbal and Non-Verbal Content Understanding Verbal and Non-Verbal Content: 4. Understands (complex and basic) - clear comprehension without cues or repetitions  ?Memory/Recall Ability Memory/Recall Ability : Current season;Staff names and faces  ? ? ?Short Term Goals: ?Week 1: SLP Short Term Goal 1 (Week 1): Pt will demonstrate 95% intelligibility in conversation with mod I use of speech strategies. ? ?Refer to Care Plan for Long Term Goals ? ?Recommendations for other services: Neuropsych and Therapeutic Recreation  Pet therapy and Outing/community reintegration ? ?Discharge Criteria: Patient will be discharged from SLP if patient refuses treatment 3 consecutive times without medical reason, if treatment goals not met, if there is a change in medical status, if patient makes no progress towards goals or if patient is discharged from hospital. ? ?The above assessment, treatment plan, treatment alternatives and goals were  discussed and mutually agreed upon: by patient ? ?Stella Bortle ?09/10/2021, 2:08 PM ? ? ?

## 2021-09-09 NOTE — H&P (Signed)
? ? ?Physical Medicine and Rehabilitation Admission H&P ? ?  ?CC: CVA ? ?HPI: 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 BP was stable, CBG was 118. He is on atorvastatin, aspirin, and Plavix outpatient. He was found to have a R MCA stroke on MRI, and was treated with IV TNK with some improvement but refused intervention. CTA head and neck showsn right proximal M2 occlusion just distal to where the right MCA occluded on 05/31/20. BP has been stable, LDL is 105, HgbA1c is 5.3. Urine found to be THC and cocaine positive. He is admitted to CIR on 4/9 with left sided weakness and impulsivity. ? ?Review of Systems  ?Constitutional: Negative.   ?HENT: Negative.    ?Eyes: Negative.   ?Respiratory: Negative.    ?Cardiovascular: Negative.   ?Gastrointestinal: Negative.   ?Genitourinary: Negative.   ?Musculoskeletal: Negative.   ?Skin: Negative.   ?Neurological:  Positive for focal weakness.  ?Endo/Heme/Allergies: Negative.   ?Psychiatric/Behavioral:  Positive for depression.   ?Past Medical History:  ?Diagnosis Date  ? CAD (coronary artery disease)   ? 10/08/16 STEMI DES x1 to pLAD EF 45%  ? Cocaine abuse ([place])   ? CVA (cerebral vascular accident) [facility])   ? Gout   ? Tobacco abuse   ? ?Past Surgical History:  ?Procedure Laterality Date  ? CORONARY STENT INTERVENTION N/A 10/08/2016  ? Procedure: Coronary Stent Intervention;  Surgeon: Burnell Blanks, MD;  Location: [place] [facility];  Service: Cardiovascular;  Laterality: N/A;  ? IR CT HEAD LTD  05/31/2020  ? IR PERCUTANEOUS ART THROMBECTOMY/INFUSION INTRACRANIAL INC DIAG ANGIO  05/31/2020  ? IR US GUIDE VASC ACCESS RIGHT  05/31/2020  ? LEFT HEART CATH AND CORONARY ANGIOGRAPHY N/A 10/08/2016  ? Procedure:  Left Heart Cath and Coronary Angiography;  Surgeon: Burnell Blanks, MD;  Location: [place] [facility];  Service: Cardiovascular;  Laterality: N/A;  ? RADIOLOGY WITH ANESTHESIA N/A 05/31/2020  ? Procedure: IR WITH ANESTHESIA;  Surgeon: Radiologist, Medication, MD;  Location: [place];  Service: Radiology;  Laterality: N/A;  ? RADIOLOGY WITH ANESTHESIA N/A 09/05/2021  ? Procedure: RADIOLOGY WITH ANESTHESIA;  Surgeon: Radiologist, Medication, MD;  Location: [place];  Service: Radiology;  Laterality: N/A;  ? TOE SURGERY    ? ?Family History  ?Problem Relation Age of Onset  ? Hypertension Father   ? ?Social History:  reports that he has been smoking cigarettes. He has been smoking an average of 1 pack per day. He has never used smokeless tobacco. He reports current alcohol use. He reports current drug use. Drug: Marijuana. ?Allergies: No Known Allergies ?No medications prior to admission.  ? ? ? ? ?Home: ?Home Living ?Family/patient expects to be discharged to:: Private residence ?Living Arrangements: Other (Comment) ("a woman" (likely significant other)) ?Available Help at Discharge: Family, Available 24 hours/day ?Type of Home: Other(Comment) (boarding house) ?Home Access: Level entry ?Home Layout: One level ?Bathroom Shower/Tub: Walk-in shower ?Bathroom Toilet: Standard ?Bathroom Accessibility: No ?Home Equipment: Grab bars - tub/shower ?Additional Comments: Reports he has been staying with a woman in a boarding house but may go to another location soon? has 3 children that live in this area ? Lives With: Significant other, Daughter ?  ?Functional History: ?  Prior Function ?Prior Level of Function : Independent/Modified Independent, Driving, Working/employed ?Mobility Comments: no use of AD for mobility ?ADLs Comments: Independent with ADLs, IADLs, driving and shopping. 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 ?Functional Status:  ?Mobility: ?Bed Mobility ?Overal bed mobility: Needs  Assistance ?Bed Mobility: Supine to Sit ?Supine to sit: Min guard, HOB elevated ?General bed mobility comments: for safety with L lateral lean with cues to correct ?Transfers ?Overall transfer level: Needs assistance ?Equipment used: 2 person hand held assist ?Transfers: Sit to/from Stand, Bed to chair/wheelchair/BSC ?Sit to Stand: Mod assist, +2 physical assistance, +2 safety/equipment, Min assist ?Bed to/from chair/wheelchair/BSC transfer type:: Step pivot ?Step pivot transfers: +2 physical assistance, +2 safety/equipment, Mod assist ?General transfer comment: Good power up, physical assist required for balance once standing with hands off armrests. ?Ambulation/Gait ?Ambulation/Gait assistance: Max assist, +2 physical assistance ?Gait Distance (Feet): 3 Feet ?Assistive device: 2 person hand held assist ?Gait Pattern/deviations: Step-to pattern, Decreased stride length, Narrow base of support ?General Gait Details: unsafe to attempt today with PTA and nurse tech. Will try again with rehab tech. ?Gait velocity: decreased ?  ? ?ADL: ?ADL ?Overall ADL's : Needs assistance/impaired ?Eating/Feeding: Set up, Sitting ?Eating/Feeding Details (indicate cue type and reason): was noted with some coughing, talking with mouth full ?Grooming: Minimal assistance, Sitting ?Grooming Details (indicate cue type and reason): able to reach to face with washcloth; would need assist with bimanual tasks ?Upper Body Bathing: Minimal assistance, Sitting ?Lower Body Bathing: Sit to/from stand, Moderate assistance ?Upper Body Dressing : Moderate assistance, Sitting ?Upper Body Dressing Details (indicate cue type and reason): to don gown around back, assist to don affected UE first ?Lower Body Dressing: Moderate assistance, Sitting/lateral leans ?Lower Body Dressing Details (indicate cue type and reason): able to reach feet bringing feet to self. will need assist in standing due to lateral leans and poor balance ?Toilet Transfer: Maximal  assistance, +2 for physical assistance, +2 for safety/equipment, Stand-pivot, BSC/3in1 ?Toileting- Clothing Manipulation and Hygiene: Moderate assistance, Sitting/lateral lean ?General ADL Comments: Limited by L sided weakness, coordination deficits requiring +2 assist for safe dynamic standing and transfer attempts. Educated on shoulder positioning for swelling and joint integrity, self ROM and AROM of UE, as well as AROM of LE ? ?Cognition: ?Cognition ?Overall Cognitive Status: No family/caregiver present to determine baseline cognitive functioning ?Arousal/Alertness: Lethargic ?Orientation Level: Oriented X4 ?Year: 2023 ?Day of Week: Correct ?Attention: Focused ?Focused Attention: Appears intact ?Memory: Impaired ?Memory Impairment: Storage deficit, Decreased recall of new information, Decreased short term memory ?Decreased Short Term Memory: Verbal basic, Functional basic ?Awareness: Appears intact ?Problem Solving: Appears intact ?Safety/Judgment: Appears intact ?Cognition ?Arousal/Alertness: Awake/alert ?Behavior During Therapy: [facility] for tasks assessed/performed, Impulsive ?Overall Cognitive Status: No family/caregiver present to determine baseline cognitive functioning ?General Comments: Pleasant, participatory. overwhelmed with deficits, reports not ever "being handicapped before" and tearful. responds well to encouragement, requires conistent safety cues due to minor impulsivity. Chair alarm is a must. ? ?Physical Exam: ?Blood pressure 115/73, pulse 63, temperature 98.1 ?F (36.7 ?C), temperature source Oral, resp. rate 20, weight 77.8 kg, SpO2 99 %. ?Physical Exam ?Gen: no distress, normal appearing ?HEENT: Left homonymous hemianopsia ?Cardio: Reg rate ?Chest: normal effort, normal rate of breathing ?Abd: soft, non-distended ?Ext: no edema ?Psych: pleasant, normal affect ?Skin: intact ?Neuro/Musculoskeletal: Left homonymous hemianopsia. Sensation intact. LUE drift. Impaired LUE coordination. 4/5 strength  throughout left side. Right sided strength intact.  ? ?No results found for this or any previous visit (from  the past 48 hour(s)). ?MR BRAIN WO CONTRAST ? ?Result Date: 09/08/2021 ?CLINICAL DATA:  Stroke workup EXAM: MRI

## 2021-09-09 NOTE — H&P (Signed)
? ? ?Physical Medicine and Rehabilitation Admission H&P ? ?  ?CC: CVA ? ?HPI: 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 BP was stable, CBG was 118. He is on atorvastatin, aspirin, and Plavix outpatient. He was found to have a R MCA stroke on MRI, and was treated with IV TNK with some improvement but refused intervention. CTA head and neck showsn right proximal M2 occlusion just distal to where the right MCA occluded on 05/31/20. BP has been stable, LDL is 105, HgbA1c is 5.3. Urine found to be THC and cocaine positive. He is admitted to CIR on 4/9 with left sided weakness and impulsivity. ? ?Review of Systems  ?Constitutional: Negative.   ?HENT: Negative.    ?Eyes: Negative.   ?Respiratory: Negative.    ?Cardiovascular: Negative.   ?Gastrointestinal: Negative.   ?Genitourinary: Negative.   ?Musculoskeletal: Negative.   ?Skin: Negative.   ?Neurological:  Positive for focal weakness.  ?Endo/Heme/Allergies: Negative.   ?Psychiatric/Behavioral:  Positive for depression.   ?Past Medical History:  ?Diagnosis Date  ? CAD (coronary artery disease)   ? 10/08/16 STEMI DES x1 to pLAD EF 45%  ? Cocaine abuse ([place])   ? CVA (cerebral vascular accident) [facility])   ? Gout   ? Tobacco abuse   ? ?Past Surgical History:  ?Procedure Laterality Date  ? CORONARY STENT INTERVENTION N/A 10/08/2016  ? Procedure: Coronary Stent Intervention;  Surgeon: Burnell Blanks, MD;  Location: [place] [facility];  Service: Cardiovascular;  Laterality: N/A;  ? IR CT HEAD LTD  05/31/2020  ? IR PERCUTANEOUS ART THROMBECTOMY/INFUSION INTRACRANIAL INC DIAG ANGIO  05/31/2020  ? IR US GUIDE VASC ACCESS RIGHT  05/31/2020  ? LEFT HEART CATH AND CORONARY ANGIOGRAPHY N/A 10/08/2016  ? Procedure:  Left Heart Cath and Coronary Angiography;  Surgeon: Burnell Blanks, MD;  Location: [place] [facility];  Service: Cardiovascular;  Laterality: N/A;  ? RADIOLOGY WITH ANESTHESIA N/A 05/31/2020  ? Procedure: IR WITH ANESTHESIA;  Surgeon: Radiologist, Medication, MD;  Location: [place];  Service: Radiology;  Laterality: N/A;  ? RADIOLOGY WITH ANESTHESIA N/A 09/05/2021  ? Procedure: RADIOLOGY WITH ANESTHESIA;  Surgeon: Radiologist, Medication, MD;  Location: [place];  Service: Radiology;  Laterality: N/A;  ? TOE SURGERY    ? ?Family History  ?Problem Relation Age of Onset  ? Hypertension Father   ? ?Social History:  reports that he has been smoking cigarettes. He has been smoking an average of 1 pack per day. He has never used smokeless tobacco. He reports current alcohol use. He reports current drug use. Drug: Marijuana. ?Allergies: No Known Allergies ?Medications Prior to Admission  ?Medication Sig Dispense Refill  ? [START ON 09/10/2021] aspirin 81 MG chewable tablet Chew 1 tablet (81 mg total) by mouth daily. 30 tablet 0  ? [START ON 09/10/2021] atorvastatin (LIPITOR) 40 MG tablet Take 1 tablet (40 mg total) by mouth daily. 30 tablet 0  ? [START ON 09/10/2021] clopidogrel (PLAVIX) 75 MG tablet Take 1 tablet (75 mg total) by mouth daily. 30 tablet 2  ? ? ? ? ?Home: ?Home Living ?Family/patient expects to be discharged to:: Private residence ?Living Arrangements: Other (Comment) ("a woman" (likely significant other)) ?Available Help at Discharge: Family, Available 24  hours/day ?Type of Home: Other(Comment) (boarding house) ?Home Access: Level entry ?Home Layout: One level ?Bathroom Shower/Tub: Walk-in shower ?Bathroom Toilet: Standard ?Bathroom Accessibility: No ?Home Equipment: Grab bars - tub/shower ?Additional Comments: Reports he has been staying with a woman in a boarding house but may go to another location soon? has 3 children that live in this area ? Lives With: Significant other, Daughter ?  ?Functional  History: ?Prior Function ?Prior Level of Function : Independent/Modified Independent, Driving, Working/employed ?Mobility Comments: no use of AD for mobility ?ADLs Comments: Independent with ADLs, IADLs, driving and shopping. 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  ?Functional Status:  ?Mobility: ?Bed Mobility ?Overal bed mobility: Needs Assistance ?Bed Mobility: Supine to Sit ?Supine to sit: Min guard, HOB elevated ?General bed mobility comments: for safety with L lateral lean with cues to correct ?Transfers ?Overall transfer level: Needs assistance ?Equipment used: 2 person hand held assist ?Transfers: Sit to/from Stand, Bed to chair/wheelchair/BSC ?Sit to Stand: Mod assist, +2 physical assistance, +2 safety/equipment, Min assist ?Bed to/from chair/wheelchair/BSC transfer type:: Step pivot ?Step pivot transfers: +2 physical assistance, +2 safety/equipment, Mod assist ?General transfer comment: Good power up, physical assist required for balance once standing with hands off armrests. ?Ambulation/Gait ?Ambulation/Gait assistance: Max assist, +2 physical assistance ?Gait Distance (Feet): 3 Feet ?Assistive device: 2 person hand held assist ?Gait Pattern/deviations: Step-to pattern, Decreased stride length, Narrow base of support ?General Gait Details: unsafe to attempt today with PTA and nurse tech. Will try again with rehab tech. ?Gait velocity: decreased ?  ?ADL: ?ADL ?Overall ADL's : Needs assistance/impaired ?Eating/Feeding: Set up, Sitting ?Eating/Feeding Details (indicate cue type and reason): was noted with some coughing, talking with mouth full ?Grooming: Minimal assistance, Sitting ?Grooming Details (indicate cue type and reason): able to reach to face with washcloth; would need assist with bimanual tasks ?Upper Body Bathing: Minimal assistance, Sitting ?Lower Body Bathing: Sit to/from stand, Moderate assistance ?Upper Body Dressing : Moderate assistance, Sitting ?Upper Body Dressing  Details (indicate cue type and reason): to don gown around back, assist to don affected UE first ?Lower Body Dressing: Moderate assistance, Sitting/lateral leans ?Lower Body Dressing Details (indicate cue type and reason): able to reach feet bringing feet to self. will need assist in standing due to lateral leans and poor balance ?Toilet Transfer: Maximal assistance, +2 for physical assistance, +2 for safety/equipment, Stand-pivot, BSC/3in1 ?Toileting- Clothing Manipulation and Hygiene: Moderate assistance, Sitting/lateral lean ?General ADL Comments: Limited by L sided weakness, coordination deficits requiring +2 assist for safe dynamic standing and transfer attempts. Educated on shoulder positioning for swelling and joint integrity, self ROM and AROM of UE, as well as AROM of LE ?  ?Cognition: ?Cognition ?Overall Cognitive Status: No family/caregiver present to determine baseline cognitive functioning ?Arousal/Alertness: Lethargic ?Orientation Level: Oriented X4 ?Year: 2023 ?Day of Week: Correct ?Attention: Focused ?Focused Attention: Appears intact ?Memory: Impaired ?Memory Impairment: Storage deficit, Decreased recall of new information, Decreased short term memory ?Decreased Short Term Memory: Verbal basic, Functional basic ?Awareness: Appears intact ?Problem Solving: Appears intact ?Safety/Judgment: Appears intact ?Cognition ?Arousal/Alertness: Awake/alert ?Behavior During Therapy: [facility] for tasks assessed/performed, Impulsive ?Overall Cognitive Status: No family/caregiver present to determine baseline cognitive functioning ?General Comments: Pleasant, participatory. overwhelmed with deficits, reports not ever "being handicapped before" and tearful. responds well to encouragement, requires conistent safety cues due to minor impulsivity. Chair alarm is a must. ? ?Physical Exam: ?Blood pressure 114/74, pulse 62, temperature 98.4 ?F (36.9 ?C), temperature source Oral, resp. rate 16, height  5\' 9"  (1.753 m), weight  72.3 kg, SpO2 100 %. ?Physical Exam ?Gen: no distress, normal appearing ?HEENT: Left homonymous hemianopsia ?Cardio: Reg rate ?Chest: normal effort, normal rate of breathing ?Abd: soft, non-distended ?Ext: no

## 2021-09-09 NOTE — PMR Pre-admission (Signed)
PMR Admission Coordinator Pre-Admission Assessment ? ?Patient: Mark Herrera is an 61 y.o., male ?MRN: [id number] ?DOB: 05/04/1961 ?Height:   ?Weight: 77.8 kg ? ?Insurance Information ?HMO:     PPO:      PCP:      IPA:      80/20:      OTHER:  ? ?PRIMARY: Uninsured  ? ?SECONDARY:       Policy#:      Phone#:  ? ?Financial Counselor:       Phone#:  ? ?The ?Data Collection Information Summary? for patients in Inpatient Rehabilitation Facilities with attached ?Privacy Act Statement-Health Care Records? was provided and verbally reviewed with: Patient ? ?Emergency Contact Information ?Contact Information   ? ? Name Relation Home Work Mobile  ? Vallery Sa Son [phone]  [phone]  ? Carmie End Daughter [phone]  [phone]  ? ?  ? ? ?Current Medical History  ?Patient Admitting Diagnosis: CVA   ?History of Present Illness: Mark Herrera is an 61 y.o. male with a PMHx of CAD s/p stenting, cocaine abuse, strokes x 2 without residual deficit, gout and tobacco abuse presenting to the ED as a Code Stroke via EMS after girlfriend noticed sudden onset of dysarthria, left facial droop, left sided weakness and gait instability. Time of symptom onset was the same as LKN: 1740. Vitals per EMS: 128/80 and CBG 118. On arrival to the ED the patient continued to be weak on the left side. CT head showed no acute intracranial process. ASPECTS is 10. Expected evolution of previously noted infarcts in the right insula and posterior right temporal lobe, with encephalomalacia.  CTA of head and neck: Right proximal M2 occlusion, just distal to where the right MCA occluded on 05/31/2020, with poor perfusion of more distal right MCA branches. No additional hemodynamically significant intracranial stenosis. No hemodynamically significant stenosis in the neck. Pt. Was administered TNK on admission. Initially declined thrombectomy and MRI but consented to MRI 4/8 with results as follows. Acute right MCA branch infarct as  described. Pre-existing right insular cortex infarct,  Chronic small vessel ischemia in the hemispheric white matter and ?pons. PT/OT/SLP consulted and recommended CIR to assist in return to PLOF.  ? ? ?Complete NIHSS TOTAL: 3 ? ?[facility] has been reviewed by the rehabilitation admission coordinator and physician. ? ?Past Medical History  ?Past Medical History:  ?Diagnosis Date  ? CAD (coronary artery disease)   ? 10/08/16 STEMI DES x1 to pLAD EF 45%  ? Cocaine abuse ([facility])   ? CVA (cerebral vascular accident) [facility])   ? Gout   ? Tobacco abuse   ? ? ?Has the patient had major surgery during 100 days prior to admission? No ? ?Family History   ?family history includes Hypertension in his father. ? ?Current Medications ? ?Current Facility-Administered Medications:  ?   stroke: mapping our early stages of recovery book, , Does not apply, Once, Caryl Pina, MD ?  0.9 %  sodium chloride infusion, , Intravenous, Once, Micki Riley, MD ?  acetaminophen (TYLENOL) tablet 650 mg, 650 mg, Oral, Q4H PRN, 650 mg at 09/08/21 1536 **OR** acetaminophen (TYLENOL) 160 MG/5ML solution 650 mg, 650 mg, Per Tube, Q4H PRN **OR** acetaminophen (TYLENOL) suppository 650 mg, 650 mg, Rectal, Q4H PRN, Caryl Pina, MD ?  aspirin chewable tablet 81 mg, 81 mg, Oral, Daily, Carlyn Reichert, MD, 81 mg at 09/09/21 5465 ?  atorvastatin (LIPITOR) tablet 40 mg, 40 mg, Oral, Daily, Carlyn Reichert, MD,  40 mg at 09/09/21 0806 ?  Chlorhexidine Gluconate Cloth 2 % PADS 6 each, 6 each, Topical, Daily, Kerney Elbe, MD, 6 each at 09/08/21 [phone] ?  clopidogrel (PLAVIX) tablet 75 mg, 75 mg, Oral, Daily, Corky Sox, MD, 75 mg at 09/09/21 0806 ?  pantoprazole (PROTONIX) injection 40 mg, 40 mg, Intravenous, QHS, Kerney Elbe, MD, 40 mg at 09/08/21 2326 ?  senna-docusate (Senokot-S) tablet 1 tablet, 1 tablet, Oral, QHS PRN, Kerney Elbe, MD ?  sodium chloride flush (NS) 0.9 % injection 3 mL, 3 mL,  Intravenous, Once, Godfrey Pick, MD ? ?Patients Current Diet:  ?Diet Order   ? ?       ?  Diet regular Room service appropriate? Yes; Fluid consistency: Thin  Diet effective now       ?  ? ?  ?  ? ?  ? ? ?Precautions / Restrictions ?Precautions ?Precautions: Fall ?Restrictions ?Weight Bearing Restrictions: No  ? ?Has the patient had 2 or more falls or a fall with injury in the past year? No ? ?Prior Activity Level ?Community (5-7x/wk): Pt. was active in the community PTA ? ?Prior Functional Level ?Self Care: Did the patient need help bathing, dressing, using the toilet or eating? Needed some help ? ?Indoor Mobility: Did the patient need assistance with walking from room to room (with or without device)? Needed some help ? ?Stairs: Did the patient need assistance with internal or external stairs (with or without device)? Independent ? ?Functional Cognition: Did the patient need help planning regular tasks such as shopping or remembering to take medications? Independent ? ?Patient Information ?Are you of Hispanic, Latino/a,or Spanish origin?: A. No, not of Hispanic, Latino/a, or Spanish origin ?What is your race?: B. Black or African American ?Do you need or want an interpreter to communicate with a doctor or health care staff?: 0. No ? ?Patient's Response To:  ?Health Literacy and Transportation ?Is the patient able to respond to health literacy and transportation needs?: Yes ?Health Literacy - How often do you need to have someone help you when you read instructions, pamphlets, or other written material from your doctor or pharmacy?: Never ?In the past 12 months, has lack of transportation kept you from medical appointments or from getting medications?: Yes ?In the past 12 months, has lack of transportation kept you from meetings, work, or from getting things needed for daily living?: Yes ? ?Home Assistive Devices / Equipment ?Home Equipment: Grab bars - tub/shower ? ?Prior Device Use: Indicate devices/aids used by  the patient prior to current illness, exacerbation or injury? None of the above ? ?Current Functional Level ?Cognition ? Arousal/Alertness: Lethargic ?Overall Cognitive Status: No family/caregiver present to determine baseline cognitive functioning ?Orientation Level: Oriented X4 ?General Comments: Pleasant, participatory. overwhelmed with deficits, reports not ever "being handicapped before" and tearful. responds well to encouragement, requires conistent safety cues due to minor impulsivity. Chair alarm is a must. ?Attention: Focused ?Focused Attention: Appears intact ?Memory: Impaired ?Memory Impairment: Storage deficit, Decreased recall of new information, Decreased short term memory ?Decreased Short Term Memory: Verbal basic, Functional basic ?Awareness: Appears intact ?Problem Solving: Appears intact ?Safety/Judgment: Appears intact ?   ?Extremity Assessment ?(includes Sensation/Coordination) ? Upper Extremity Assessment: LUE deficits/detail, Defer to OT evaluation ?LUE Deficits / Details: Noted weakness though ROM fair. 3-/5 shoulder, able to lift up to 100* though difficult to sustain.weak grasp, wrist extensors. noted coordination deficits but reports sensation same as R UE and normal ?LUE Sensation: WNL ?LUE Coordination: decreased fine motor, decreased  gross motor  ?Lower Extremity Assessment: LLE deficits/detail ?LLE Deficits / Details: grossly 4-/5 but poor functional awareness and needing cues to reposition. pt reports sensation intact, impaired corrdination ?LLE Sensation: WNL ?LLE Coordination: decreased fine motor, decreased gross motor  ?  ?ADLs ? Overall ADL's : Needs assistance/impaired ?Eating/Feeding: Set up, Sitting ?Eating/Feeding Details (indicate cue type and reason): was noted with some coughing, talking with mouth full ?Grooming: Minimal assistance, Sitting ?Grooming Details (indicate cue type and reason): able to reach to face with washcloth; would need assist with bimanual tasks ?Upper  Body Bathing: Minimal assistance, Sitting ?Lower Body Bathing: Sit to/from stand, Moderate assistance ?Upper Body Dressing : Moderate assistance, Sitting ?Upper Body Dressing Details (indicate cue type and rea

## 2021-09-09 NOTE — Progress Notes (Addendum)
Inpatient Rehabilitation Admission Medication Review by a Pharmacist ? ?A complete drug regimen review was completed for this patient to identify any potential clinically significant medication issues. ? ?High Risk Drug Classes Is patient taking? Indication by Medication  ?Antipsychotic No   ?Anticoagulant No   ?Antibiotic No   ?Opioid No   ?Antiplatelet Yes Aspirin and plavix for stroke  ?Hypoglycemics/insulin No   ?Vasoactive Medication No   ?Chemotherapy No   ?Other No   ? ? ? ?Type of Medication Issue Identified Description of Issue Recommendation(s)  ?Drug Interaction(s) (clinically significant) ?    ?Duplicate Therapy ?    ?Allergy ?    ?No Medication Administration End Date ? Plavix  Patient should be on aspirin + plavix x3 mo then continue aspirin as monotherapy thereafter   ?Incorrect Dose ?    ?Additional Drug Therapy Needed ?    ?Significant med changes from prior encounter (inform family/care partners about these prior to discharge).    ?Other ? IV pantoprazole If patient is tolerating other oral medications, consider switching to PO   ? ? ?Clinically significant medication issues were identified that warrant physician communication and completion of prescribed/recommended actions by midnight of the next day:  No ? ? ?Pharmacist comments: None ? ?Time spent performing this drug regimen review (minutes):  15 ? ?Valeda Malm, Pharm.D. ?PGY-1 Pharmacy Resident ?[phone] ?09/09/2021 12:42 PM ? ?

## 2021-09-09 NOTE — Progress Notes (Signed)
Arrived to the unit with no issues. Oriented patient to the and assigned room. Admission complete. ? ? ? ?Yehuda Mao, LPN ?

## 2021-09-10 DIAGNOSIS — I6389 Other cerebral infarction: Secondary | ICD-10-CM

## 2021-09-10 LAB — CBC
HCT: 43.9 % (ref 39.0–52.0)
Hemoglobin: 14.2 g/dL (ref 13.0–17.0)
MCH: 29 pg (ref 26.0–34.0)
MCHC: 32.3 g/dL (ref 30.0–36.0)
MCV: 89.6 fL (ref 80.0–100.0)
Platelets: 205 10*3/uL (ref 150–400)
RBC: 4.9 MIL/uL (ref 4.22–5.81)
RDW: 13.4 % (ref 11.5–15.5)
WBC: 5.3 10*3/uL (ref 4.0–10.5)
nRBC: 0 % (ref 0.0–0.2)

## 2021-09-10 LAB — URINALYSIS, ROUTINE W REFLEX MICROSCOPIC
Bilirubin Urine: NEGATIVE
Glucose, UA: NEGATIVE mg/dL
Hgb urine dipstick: NEGATIVE
Ketones, ur: NEGATIVE mg/dL
Nitrite: NEGATIVE
Protein, ur: NEGATIVE mg/dL
Specific Gravity, Urine: 1.02 (ref 1.005–1.030)
pH: 6 (ref 5.0–8.0)

## 2021-09-10 LAB — BASIC METABOLIC PANEL
Anion gap: 6 (ref 5–15)
BUN: 12 mg/dL (ref 6–20)
CO2: 28 mmol/L (ref 22–32)
Calcium: 8.8 mg/dL — ABNORMAL LOW (ref 8.9–10.3)
Chloride: 106 mmol/L (ref 98–111)
Creatinine, Ser: 1.02 mg/dL (ref 0.61–1.24)
GFR, Estimated: >60 mL/min (ref >60)
Glucose, Bld: 106 mg/dL — ABNORMAL HIGH (ref 70–99)
Potassium: 4.3 mmol/L (ref 3.5–5.1)
Sodium: 140 mmol/L (ref 135–145)

## 2021-09-10 NOTE — Discharge Instructions (Addendum)
Inpatient Rehab Discharge Instructions ? ?[name] ?Discharge date and time:   ? ?Activities/Precautions/ Functional Status: ?Activity: No Driving ?Diet: regular diet ?Wound Care: Routine skin checks ? ? ?Functional status:  ?___ No restrictions     ___ Walk up steps independently ?___ 24/7 supervision/assistance   ___ Walk up steps with assistance ?___ Intermittent supervision/assistance  ___ Bathe/dress independently ?___ Walk with walker     __x_ Bathe/dress with assistance ?___ Walk Independently    ___ Shower independently ?___ Walk with assistance    ___ Shower with assistance ?_X__ No alcohol     ___ Return to work/school ________ ? ? ?COMMUNITY REFERRALS UPON DISCHARGE:   ? ?Outpatient: PT      OT     ST       ?            Agency: [facility]     Phone:  [phone] ?            Appointment Date/Time: *Please expect follow-up within 7-10 business days to schedule your appointment. If you have not received follow-up, be sure to contact the site directly.* ? ?Medical Equipment/Items Ordered: rolling walker (charity) ?                                                Agency/Supplier: [employer] [phone]- Be sure to send in financial assistance application within 30 days.  ? ? ? ?Special Instructions: ?No driving smoking or alcohol or illicit drug use ?2.  Continue aspirin 81 mg daily and Plavix 75 mg day until 12/10/2021 then aspirin alone ? ? ?STROKE/TIA DISCHARGE INSTRUCTIONS ?SMOKING Cigarette smoking nearly doubles your risk of having a stroke & is the single most alterable risk factor  ?If you smoke or have smoked in the last 12 months, you are advised to quit smoking for your health. Most of the excess cardiovascular risk related to smoking disappears within a year of stopping. ?Ask you doctor about anti-smoking medications ?[place] Quit Line: 1-800-QUIT NOW ?Free Smoking Cessation Classes [phone]  ?CHOLESTEROL Know your levels; limit fat & cholesterol in your diet  ?Lipid Panel  ?    ?Component Value Date/Time  ? CHOL 159 09/06/2021 0701  ? TRIG 59 09/06/2021 0701  ? HDL 42 09/06/2021 0701  ? CHOLHDL 3.8 09/06/2021 0701  ? VLDL 12 09/06/2021 0701  ? LDLCALC 105 (H) 09/06/2021 0701  ? ? ? Many patients benefit from treatment even if their cholesterol is at goal. ?Goal: Total Cholesterol (CHOL) less than 160 ?Goal:  Triglycerides (TRIG) less than 150 ?Goal:  HDL greater than 40 ?Goal:  LDL (LDLCALC) less than 100 ?  ?BLOOD PRESSURE American Stroke Association blood pressure target is less that 120/80 mm/Hg  ?Your discharge blood pressure is:  BP: 117/72 Monitor your blood pressure ?Limit your salt and alcohol intake ?Many individuals will require more than one medication for high blood pressure  ?DIABETES (A1c is a blood sugar average for last 3 months) Goal HGBA1c is under 7% (HBGA1c is blood sugar average for last 3 months)  ?Diabetes: ?No known diagnosis of diabetes   ? ?Lab Results  ?Component Value Date  ? HGBA1C 5.3 09/06/2021  ? ? Your HGBA1c can be lowered with medications, healthy diet, and exercise. ?Check your blood sugar as directed by your physician ?Call your physician if you  experience unexplained or low blood sugars.  ?PHYSICAL ACTIVITY/REHABILITATION Goal is 30 minutes at least 4 days per week  ?Activity: Increase activity slowly, ?Therapies: Physical Therapy: Home Health ?Return to work:  Activity decreases your risk of heart attack and stroke and makes your heart stronger.  It helps control your weight and blood pressure; helps you relax and can improve your mood. ?Participate in a regular exercise program. ?Talk with your doctor about the best form of exercise for you (dancing, walking, swimming, cycling).  ?DIET/WEIGHT Goal is to maintain a healthy weight  ?Your discharge diet is:  ?Diet Order   ? ?       ?  Diet regular Room service appropriate? Yes; Fluid consistency: Thin  Diet effective now       ?  ? ?  ?  ? ?  ?  liquids ?Your height is:  Height: 5\' 9"  (175.3 cm) ?Your  current weight is: Weight: 72.3 kg ?Your Body Mass Index (BMI) is:  BMI (Calculated): 23.53 Following the type of diet specifically designed for you will help prevent another stroke. ?Yo are at  goal weight Your goal Body Mass Index (BMI) is 19-24. ?Healthy food habits can help reduce 3 risk factors for stroke:  High cholesterol, hypertension, and excess weight.  ?RESOURCES Stroke/Support Group:  Call [phone] ?  ?STROKE EDUCATION PROVIDED/REVIEWED AND GIVEN TO PATIENT Stroke warning signs and symptoms ?How to activate emergency medical system (call 911). ?Medications prescribed at discharge. ?Need for follow-up after discharge. ?Personal risk factors for stroke. ?Pneumonia vaccine given: No ?Flu vaccine given: No ?My questions have been answered, the writing is legible, and I understand these instructions.  I will adhere to these goals & educational materials that have been provided to me after my discharge from the hospital.  ? ? ? ?My questions have been answered and I understand these instructions. I will adhere to these goals and the provided educational materials after my discharge from the hospital. ? ?Patient/Caregiver Signature _______________________________ Date __________ ? ?Clinician Signature _______________________________________ Date __________ ? ?Please bring this form and your medication list with you to all your follow-up doctor's appointments.   ?

## 2021-09-10 NOTE — Progress Notes (Signed)
?                                                       PROGRESS NOTE ? ? ?Subjective/Complaints: ? ?Slept OK considering the bed/hospital.  ?No pain.  ?LBM yesterday. Peeing ok, but has urgency- no dysuria. Also frequency and incontinence intermittently.  ?GF in chair, asleep.  ? ? ?ROS: ? ?Pt denies SOB, abd pain, CP, N/V/C/D, and vision changes ? ? ? ?Objective: ?  ?MR BRAIN WO CONTRAST ? ?Result Date: 09/08/2021 ?CLINICAL DATA:  Stroke workup EXAM: MRI HEAD WITHOUT CONTRAST TECHNIQUE: Multiplanar, multiecho pulse sequences of the brain and surrounding structures were obtained without intravenous contrast. COMPARISON:  Head CT and CTA from yesterday in the preceding day FINDINGS: Brain: Acute right MCA branch infarct affecting the superficial temporal lobe and extending into the right frontal and parietal cortex, roughly following the sylvian fissure. Mild white matter involvement extending towards the lateral ventricle. Pre-existing right insular infarct. No hematoma, hydrocephalus, or collection. Mild chronic small vessel ischemic change in the hemispheric white matter and pons. Vascular: Major flow voids are preserved.  Recent CTA Skull and upper cervical spine: Normal marrow signal. C3-4 disc degeneration. Sinuses/Orbits: No significant finding IMPRESSION: 1. Acute right MCA branch infarct as described. Pre-existing right insular cortex infarct. 2. Chronic small vessel ischemia in the hemispheric white matter and pons. Electronically Signed   By: Tiburcio PeaJonathan  Watts M.D.   On: 09/08/2021 11:26   ?Recent Labs  ?  09/10/21 ?0637  ?WBC 5.3  ?HGB 14.2  ?HCT 43.9  ?PLT 205  ? ?Recent Labs  ?  09/10/21 ?0637  ?NA 140  ?K 4.3  ?CL 106  ?CO2 28  ?GLUCOSE 106*  ?BUN 12  ?CREATININE 1.02  ?CALCIUM 8.8*  ? ? ?Intake/Output Summary (Last 24 hours) at 09/10/2021 0943 ?Last data filed at 09/10/2021 0700 ?Gross per 24 hour  ?Intake 1260 ml  ?Output 900 ml  ?Net 360 ml  ?  ? ?  ? ?Physical Exam: ?Vital Signs ?Blood pressure 117/72,  pulse (!) 53, temperature 98.5 ?F (36.9 ?C), temperature source Oral, resp. rate 18, height 5\' 9"  (1.753 m), weight 72.3 kg, SpO2 100 %. ? ? ?General: awake, alert, appropriate, supine in bed; NAD ?HENT: conjugate gaze; oropharynx moist ?CV: regular to borderline bradycardic rate; no JVD ?Pulmonary: CTA B/L; no W/R/R- good air movement ?GI: soft, NT, ND, (+)BS ?Psychiatric: appropriate ?Neurological: alert ?Skin: intact ?Neuro/Musculoskeletal: Left homonymous hemianopsia. Sensation intact. LUE drift. Impaired LUE coordination. 4/5 strength throughout left side. Right sided strength intact.  ?  ? ? ?Assessment/Plan: ?1. Functional deficits which require 3+ hours per day of interdisciplinary therapy in a comprehensive inpatient rehab setting. ?Physiatrist is providing close team supervision and 24 hour management of active medical problems listed below. ?Physiatrist and rehab team continue to assess barriers to discharge/monitor patient progress toward functional and medical goals ? ?Care Tool: ? ?Bathing ?   ?   ?   ?  ?  ?Bathing assist   ?  ?  ?Upper Body Dressing/Undressing ?Upper body dressing   ?What is the patient wearing?: Hospital gown only ?   ?Upper body assist Assist Level: Minimal Assistance - Patient > 75% ?   ?Lower Body Dressing/Undressing ?Lower body dressing ? ? ?   ?What is the patient wearing?: Hospital gown  only ? ?  ? ?Lower body assist   ?   ? ?Toileting ?Toileting    ?Toileting assist Assist for toileting: Minimal Assistance - Patient > 75% ?  ?  ?Transfers ?Chair/bed transfer ? ?Transfers assist ?   ? ?Chair/bed transfer assist level: Minimal Assistance - Patient > 75% ?  ?  ?Locomotion ?Ambulation ? ? ?Ambulation assist ? ?   ? ?  ?  ?   ? ?Walk 10 feet activity ? ? ?Assist ?   ? ?  ?   ? ?Walk 50 feet activity ? ? ?Assist   ? ?  ?   ? ? ?Walk 150 feet activity ? ? ?Assist   ? ?  ?  ?  ? ?Walk 10 feet on uneven surface  ?activity ? ? ?Assist   ? ? ?  ?   ? ?Wheelchair ? ? ? ? ?Assist Is the  patient using a wheelchair?: No ?  ?  ? ?  ?   ? ? ?Wheelchair 50 feet with 2 turns activity ? ? ? ?Assist ? ?  ?  ? ? ?   ? ?Wheelchair 150 feet activity  ? ? ? ?Assist ?   ? ? ?   ? ?Blood pressure 117/72, pulse (!) 53, temperature 98.5 ?F (36.9 ?C), temperature source Oral, resp. rate 18, height 5\' 9"  (1.753 m), weight 72.3 kg, SpO2 100 %. ? ?Medical Problem List and Plan: ?1. Functional deficits secondary to right MCA infarction ?            -patient may shower ?            -ELOS/Goals: 10-14 days S ?            -First day of evaluations- con't CIR., PT, OT and SLP ?2.  High grade intracranial stenosis: continue aspirin and plavix (will need total 90 days) and DVT prophylaxis with SCDs.  ?3. HLD: LDL is 105, continue atorvastatin 40mg  ?4. Situational depression: provide encouragement regarding improvements in sensation and strength already.  ?5. Neuropsych: This patient is capable of making decisions on his own behalf. ?6. Impulsivity: SLP ordered ?7. Active smoker: continue smoking cessation counseling ?8. Active THC use: continue providing cessation counseling ?9. Active cocaine use: continue providing cessation counseling ?10. History of prior R MCA stroke: provide stroke prevention counseling ?11. Hyperglycemia: HgbA1c is 5.3, educate regarding low added sugar in diet.  ?12. Hypoalbuminemia: educated regarding low sugar/high protein diet.  ?13. Urinary frequency/urgency/accidents- ? 4/10- will check U/A and Cx to check for UTI. No Dysuria ?  ? ?LOS: ?1 days ?A FACE TO FACE EVALUATION WAS PERFORMED ? ?Mark Herrera ?09/10/2021, 9:43 AM  ? ? ? ?

## 2021-09-10 NOTE — Progress Notes (Signed)
Slept good. Continent using urinal. Girlfriend stayed night. Patrici Ranks A  ?

## 2021-09-10 NOTE — Progress Notes (Signed)
Incontinent of urine, requiring total assist with hygiene and bed change. Mark Herrera A  ?

## 2021-09-10 NOTE — Progress Notes (Signed)
Inpatient Rehabilitation  Patient information reviewed and entered into eRehab system by Marlina Cataldi M. Jakaya Jacobowitz, M.A., CCC/SLP, PPS Coordinator.  Information including medical coding, functional ability and quality indicators will be reviewed and updated through discharge.    

## 2021-09-10 NOTE — Evaluation (Signed)
Occupational Therapy Assessment and Plan ? ?Patient Details  ?Name: Mark Herrera ?MRN: [id number] ?Date of Birth: [birth year] ? ?OT Diagnosis: abnormal posture, acute pain, and muscle weakness (generalized) ?Rehab Potential: Rehab Potential (ACUTE ONLY): Good ?ELOS: 10-14 days  ? ?Today's Date: 09/10/2021 ?OT Individual Time: 1000-1100 ?OT Individual Time Calculation (min): 60 min    ? ?Hospital Problem: Principal Problem: ?  CVA (cerebral vascular accident) ([place]) ? ? ?Past Medical History:  ?Past Medical History:  ?Diagnosis Date  ? CAD (coronary artery disease)   ? 10/08/16 STEMI DES x1 to pLAD EF 45%  ? Cocaine abuse ([place])   ? CVA (cerebral vascular accident) [facility])   ? Gout   ? Tobacco abuse   ? ?Past Surgical History:  ?Past Surgical History:  ?Procedure Laterality Date  ? CORONARY STENT INTERVENTION N/A 10/08/2016  ? Procedure: Coronary Stent Intervention;  Surgeon: Burnell Blanks, MD;  Location: [place] [facility];  Service: Cardiovascular;  Laterality: N/A;  ? IR CT HEAD LTD  05/31/2020  ? IR PERCUTANEOUS ART THROMBECTOMY/INFUSION INTRACRANIAL INC DIAG ANGIO  05/31/2020  ? IR US GUIDE VASC ACCESS RIGHT  05/31/2020  ? LEFT HEART CATH AND CORONARY ANGIOGRAPHY N/A 10/08/2016  ? Procedure: Left Heart Cath and Coronary Angiography;  Surgeon: Burnell Blanks, MD;  Location: [place] [facility];  Service: Cardiovascular;  Laterality: N/A;  ? RADIOLOGY WITH ANESTHESIA N/A 05/31/2020  ? Procedure: IR WITH ANESTHESIA;  Surgeon: Radiologist, Medication, MD;  Location: [place];  Service: Radiology;  Laterality: N/A;  ? RADIOLOGY WITH ANESTHESIA N/A 09/05/2021  ? Procedure: RADIOLOGY WITH ANESTHESIA;  Surgeon: Radiologist, Medication, MD;  Location: [place];  Service: Radiology;  Laterality: N/A;  ? TOE SURGERY    ? ? ?Assessment & Plan ?Clinical Impression:  ? ?Mark Herrera is a 61 year old man with a past medical history of CAD s/p stenting, cocaine abuse, 2 CVAs without residual deficit, gout, and tobacco abuse, who  presented to the ED as a code stroke via EMS after girlfriend noted sudden onset dysarthria, left facial droop, left sided weakness, and gait instability. BP was stable, CBG was 118. He is on atorvastatin, aspirin, and Plavix outpatient. He was found to have a R MCA stroke on MRI, and was treated with IV TNK with some improvement but refused intervention. CTA head and neck showsn right proximal M2 occlusion just distal to where the right MCA occluded on 05/31/20. BP has been stable, LDL is 105, HgbA1c is 5.3. Urine found to be THC and cocaine positive. He is admitted to CIR on 4/9 with left sided weakness and impulsivity. Patient transferred to CIR on 09/09/2021 .   ? ?Patient currently requires mod with basic self-care skills secondary to muscle weakness, decreased cardiorespiratoy endurance, decreased coordination and decreased motor planning, decreased awareness, decreased problem solving, and decreased safety awareness, and decreased standing balance.  Prior to hospitalization, patient could complete all self-care independently. ? ?Patient will benefit from skilled intervention to decrease level of assist with basic self-care skills and increase independence with basic self-care skills prior to discharge home with care partner.  Anticipate patient will require 24 hour supervision and follow up home health. ? ?OT - End of Session ?Activity Tolerance: Tolerates 10 - 20 min activity with multiple rests ?Endurance Deficit: Yes ?Endurance Deficit Description: Fatigued after transfers during session ?OT Assessment ?Rehab Potential (ACUTE ONLY): Good ?OT Barriers to Discharge: Home environment access/layout ?OT Patient demonstrates impairments in the following area(s): Balance;Cognition;Endurance;Motor;Pain;Safety;Sensory;Vision ?OT Basic ADL's Functional  Problem(s): Grooming;Bathing;Dressing;Toileting ?OT Transfers Functional Problem(s): Toilet;Tub/Shower ?OT Additional Impairment(s): Fuctional Use of Upper  Extremity ?OT Plan ?OT Intensity: Minimum of 1-2 x/day, 45 to 90 minutes ?OT Frequency: 5 out of 7 days ?OT Duration/Estimated Length of Stay: 10-14 days ?OT Treatment/Interventions: Balance/vestibular training;Discharge planning;Functional electrical stimulation;Pain management;Self Care/advanced ADL retraining;Therapeutic Activities;UE/LE Coordination activities;Functional mobility training;Patient/family education;Therapeutic Exercise;Community reintegration;DME/adaptive equipment instruction;Neuromuscular re-education;Psychosocial support;UE/LE Strength taining/ROM;Wheelchair propulsion/positioning;Splinting/orthotics ?OT Self Feeding Anticipated Outcome(s): Mod I ?OT Basic Self-Care Anticipated Outcome(s): Supervision to CGA ?OT Toileting Anticipated Outcome(s): CGA ?OT Bathroom Transfers Anticipated Outcome(s): CGA ?OT Recommendation ?Recommendations for Other Services: Neuropsych consult;Therapeutic Recreation consult ?Therapeutic Recreation Interventions: Pet therapy;Stress management ?Patient destination: Home ?Follow Up Recommendations: Home health OT;24 hour supervision/assistance ?Equipment Recommended: To be determined ? ? ?OT Evaluation ?Precautions/Restrictions  ?Precautions ?Precautions: Fall ?Restrictions ?Weight Bearing Restrictions: No ?Home Living/Prior Functioning ?Home Living ?Family/patient expects to be discharged to:: Private residence ?Living Arrangements: Spouse/significant other, Children ?Available Help at Discharge: Family, Available 24 hours/day ?Type of Home: Other(Comment) ("boarding house") ?Home Access: Level entry ?Home Layout: One level ?Bathroom Shower/Tub: Gaffer, Curtain ?Bathroom Toilet: Standard ?Bathroom Accessibility: No (reports maybe a walker) ?Additional Comments: reports that they will be moving from the boarding house that is less accessible and will be moving in with pt's son upon d/c ? Lives With: Significant other, Daughter ?IADL History ?Homemaking  Responsibilities: No ?Current License: Yes ?Mode of Transportation: Car ?Occupation: Full time employment ?Type of Occupation: Set designer, working on roofs ?Leisure and Hobbies: tv, hang out with significant other ?Prior Function ?Level of Independence: Independent with basic ADLs, Independent with gait, Independent with homemaking with ambulation ? Able to Take Stairs?: Reciprically ?Driving: Yes ?Vocation: Full time employment ?Vocation Requirements: reports working as needed when work opportunities surfaced ?Leisure: Hobbies-yes (Comment) ?Vision ?Baseline Vision/History: 0 No visual deficits ?Ability to See in Adequate Light: 0 Adequate ?Patient Visual Report: No change from baseline ?Vision Assessment?: Vision impaired- to be further tested in functional context ?Additional Comments: noted L homonymous hemianopsia per neuro note - to be further assessed ?Perception  ?Perception: Within Functional Limits ?Praxis ?Praxis: Impaired ?Cognition ?Cognition ?Overall Cognitive Status: Within Functional Limits for tasks assessed ?Arousal/Alertness: Awake/alert ?Orientation Level: Person;Place;Situation ?Person: Oriented ?Place: Oriented ?Situation: Oriented ?Memory: Impaired ?Focused Attention: Appears intact ?Awareness: Appears intact ?Problem Solving: Impaired ?Safety/Judgment: Impaired ?Brief Interview for Mental Status (BIMS) ?Repetition of Three Words (First Attempt): 3 ?Temporal Orientation: Year: Correct ?Temporal Orientation: Month: Accurate within 5 days ?Temporal Orientation: Day: Correct ?Recall: "Sock": Yes, no cue required ?Recall: "Blue": Yes, no cue required ?Recall: "Bed": Yes, no cue required ?BIMS Summary Score: 15 ?Sensation ?Sensation ?Light Touch: Impaired by gross assessment ?Hot/Cold: Appears Intact ?Proprioception: Impaired by gross assessment ?Stereognosis: Appears Intact ?Additional Comments: Impaired proximally vs distally on LUE ?Coordination ?Gross Motor Movements are Fluid and  Coordinated: No ?Coordination and Movement Description: generalized weakness and deconditioning ?Finger Nose Finger Test: Impaired on LUE, WFL but still slow on RUE ?Motor  ?Motor ?Motor: Other (comment) (L sided hemiparesis) ?M

## 2021-09-10 NOTE — Progress Notes (Signed)
Inpatient Rehabilitation Care Coordinator ?Assessment and Plan ?Patient Details  ?Name: Mark Herrera ?MRN: [id number] ?Date of Birth: 1960-08-23 ? ?Today's Date: 09/10/2021 ? ?Hospital Problems: Principal Problem: ?  CVA (cerebral vascular accident) ([place]) ? ?Past Medical History:  ?Past Medical History:  ?Diagnosis Date  ? CAD (coronary artery disease)   ? 10/08/16 STEMI DES x1 to pLAD EF 45%  ? Cocaine abuse ([place])   ? CVA (cerebral vascular accident) [facility])   ? Gout   ? Tobacco abuse   ? ?Past Surgical History:  ?Past Surgical History:  ?Procedure Laterality Date  ? CORONARY STENT INTERVENTION N/A 10/08/2016  ? Procedure: Coronary Stent Intervention;  Surgeon: Burnell Blanks, MD;  Location: [place] [facility];  Service: Cardiovascular;  Laterality: N/A;  ? IR CT HEAD LTD  05/31/2020  ? IR PERCUTANEOUS ART THROMBECTOMY/INFUSION INTRACRANIAL INC DIAG ANGIO  05/31/2020  ? IR US GUIDE VASC ACCESS RIGHT  05/31/2020  ? LEFT HEART CATH AND CORONARY ANGIOGRAPHY N/A 10/08/2016  ? Procedure: Left Heart Cath and Coronary Angiography;  Surgeon: Burnell Blanks, MD;  Location: [place] [facility];  Service: Cardiovascular;  Laterality: N/A;  ? RADIOLOGY WITH ANESTHESIA N/A 05/31/2020  ? Procedure: IR WITH ANESTHESIA;  Surgeon: Radiologist, Medication, MD;  Location: [place];  Service: Radiology;  Laterality: N/A;  ? RADIOLOGY WITH ANESTHESIA N/A 09/05/2021  ? Procedure: RADIOLOGY WITH ANESTHESIA;  Surgeon: Radiologist, Medication, MD;  Location: [place];  Service: Radiology;  Laterality: N/A;  ? TOE SURGERY    ? ?Social History:  reports that he has been smoking cigarettes. He has been smoking an average of 1 pack per day. He has never used smokeless tobacco. He reports current alcohol use. He reports current drug use. Drug: Marijuana. ? ?Family / Support Systems ?Marital Status: Single ?How Long?: wife passed 10 years ago ?Patient Roles: Parent, Partner ?Spouse/Significant Other: Enis Slipper (s/o)  [phone] ?Children: 3 children- Melissa, Mya, and [name]- all live in [place] ?Other Supports: s/o ?Anticipated Caregiver: son Myna Bright ?Ability/Limitations of Caregiver: Pt will d/c to home with his son Myna Bright who works, and there will be support from pt s/o, and his other children ?Caregiver Availability: 24/7 ?Family Dynamics: Pt was living with s/o in boarding house ? ?Social History ?Preferred language: English ?Religion: Christian ?Cultural Background: Pt worked as Engineer, technical sales, Consulting civil engineer, Financial planner, and landscaping ?Education: high school grad ?Health Literacy - How often do you need to have someone help you when you read instructions, pamphlets, or other written material from your doctor or pharmacy?: Never ?Writes: Yes ?Employment Status: Unemployed ?Date Retired/Disabled/Unemployed: Self-employed- works as opportunites are available ?Legal History/Current Legal Issues: Pt reports he served 10 yrs in prison 17-18-yrs ago. No current issues and no probation. ?Guardian/Conservator: N/A  ? ?Abuse/Neglect ?Abuse/Neglect Assessment Can Be Completed: Yes ?Physical Abuse: Denies ?Verbal Abuse: Denies ?Sexual Abuse: Denies ?Exploitation of patient/patient's resources: Denies ?Self-Neglect: Denies ? ?Patient response to: ?Social Isolation - How often do you feel lonely or isolated from those around you?: Never ? ?Emotional Status ?Pt's affect, behavior and adjustment status: Pt in good spirits at time of visit. Pt often does not express his full self when s/o is around. Likely to remain as a strong figure at this time. ?Recent Psychosocial Issues: Denies ?Psychiatric History: Denies ?Substance Abuse History: Admits to smoking cigarettes 1/2-1ppd; and smoking marijuana laced with cocaine. Vague answer on length of use. reports a low period when he abused drugs when his wife passed. ? ?Patient / Family Perceptions, Expectations &  Goals ?Pt/Family understanding of illness & functional  limitations: Pt and family have a general understanding of pt care needs ?Premorbid pt/family roles/activities: Independent with some assistance with self/care and mobility ?Anticipated changes in roles/activities/participation: Assistance with ADLs/IADLs ?Pt/family expectations/goals: Pt goal is to get his health back. ? ?[employer] ?Community Agencies: None ?Premorbid Home Care/DME Agencies: None ?Transportation available at discharge: TBD ?Is the patient able to respond to transportation needs?: Yes ?In the past 12 months, has lack of transportation kept you from medical appointments or from getting medications?: No ?In the past 12 months, has lack of transportation kept you from meetings, work, or from getting things needed for daily living?: No ?Resource referrals recommended: Neuropsychology ? ?Discharge Planning ?Living Arrangements: Children ?Support Systems: Children, Spouse/significant other ?Type of Residence: Private residence ?Insurance Resources: Self-pay ?Financial Resources: Family Support ?Financial Screen Referred: Yes ?Money Management: Family ?Does the patient have any problems obtaining your medications?: No ?Home Management: Pt lived in [place] prior to admission. ?Patient/Family Preliminary Plans: TBD ?Care Coordinator Barriers to Discharge: Decreased caregiver support, Lack of/limited family support ?Care Coordinator Barriers to Discharge Comments: Pt is uninsured; limited natural supports due to work schedules during the day ?Care Coordinator Anticipated Follow Up Needs: HH/OP ? ?Clinical Impression ?SW met with pt and pt partner Enis Slipper in room to introduce self, explain role, and discuss discharge process.  Pt is not a English as a second language teacher. No DME. No HCPOA. Pt does not have a PCP and would like assistance with establishing care. Pt is calm and pleasant. He masks with humor, however, realizes that he will require support at discharge. Pt aware SW to f/u with his son [place].  ? ?SW  received updates from Joanne/[employer] reporting pt should be seen tomorrow to discuss Medicaid and disability.  ? ?Pt has hospital f/u with Dr. Margarita Rana on May 10 at 2:30pm at [facility] and Wellness.  ? ?1348-SW spoke with pt son to introduce self, explain role, and discuss discharge process. He confirms pt will d/c to his home and there will be support from siblings since he works.He is aware SW will f/u tomorrow after team conference.  ? ?Rana Snare ?09/10/2021, 2:07 PM ? ?  ?

## 2021-09-10 NOTE — Plan of Care (Signed)
?  Problem: RH Expression Communication ?Goal: LTG Patient will increase speech intelligibility (SLP) ?Description: LTG: Patient will increase speech intelligibility at word/phrase/conversation level with cues, % of the time (SLP) ?Flowsheets (Taken 09/10/2021 1403) ?LTG: Patient will increase speech intelligibility (SLP): Modified Independent ?Level: Conversation level ?Percent of time patient will use intelligible speech: 95% ?  ?

## 2021-09-10 NOTE — Evaluation (Signed)
Physical Therapy Assessment and Plan ? ?Patient Details  ?Name: Mark Herrera ?MRN: [id number] ?Date of Birth: 1961-03-06 ? ?PT Diagnosis: Abnormality of gait, Ataxic gait, Difficulty walking, Hemiparesis non-dominant, and Muscle weakness ?Rehab Potential: Good ?ELOS: 10-14 days  ? ?Today's Date: 09/10/2021 ?PT Individual Time: [id number] ?PT Individual Time Calculation (min): 55 min   ? ?Hospital Problem: Principal Problem: ?  CVA (cerebral vascular accident) ([place]) ? ? ?Past Medical History:  ?Past Medical History:  ?Diagnosis Date  ? CAD (coronary artery disease)   ? 10/08/16 STEMI DES x1 to pLAD EF 45%  ? Cocaine abuse ([place])   ? CVA (cerebral vascular accident) [facility])   ? Gout   ? Tobacco abuse   ? ?Past Surgical History:  ?Past Surgical History:  ?Procedure Laterality Date  ? CORONARY STENT INTERVENTION N/A 10/08/2016  ? Procedure: Coronary Stent Intervention;  Surgeon: Burnell Blanks, MD;  Location: [place] [facility];  Service: Cardiovascular;  Laterality: N/A;  ? IR CT HEAD LTD  05/31/2020  ? IR PERCUTANEOUS ART THROMBECTOMY/INFUSION INTRACRANIAL INC DIAG ANGIO  05/31/2020  ? IR US GUIDE VASC ACCESS RIGHT  05/31/2020  ? LEFT HEART CATH AND CORONARY ANGIOGRAPHY N/A 10/08/2016  ? Procedure: Left Heart Cath and Coronary Angiography;  Surgeon: Burnell Blanks, MD;  Location: [place] [facility];  Service: Cardiovascular;  Laterality: N/A;  ? RADIOLOGY WITH ANESTHESIA N/A 05/31/2020  ? Procedure: IR WITH ANESTHESIA;  Surgeon: Radiologist, Medication, MD;  Location: [place];  Service: Radiology;  Laterality: N/A;  ? RADIOLOGY WITH ANESTHESIA N/A 09/05/2021  ? Procedure: RADIOLOGY WITH ANESTHESIA;  Surgeon: Radiologist, Medication, MD;  Location: [place];  Service: Radiology;  Laterality: N/A;  ? TOE SURGERY    ? ? ?Assessment & Plan ?Clinical Impression: Patient is a 61 year old man with a past medical history of CAD s/p stenting, cocaine abuse, 2 CVAs without residual deficit, gout, and tobacco abuse, who  presented to the ED as a code stroke via EMS after girlfriend noted sudden onset dysarthria, left facial droop, left sided weakness, and gait instability. BP was stable, CBG was 118. He is on atorvastatin, aspirin, and Plavix outpatient. He was found to have a R MCA stroke on MRI, and was treated with IV TNK with some improvement but refused intervention. CTA head and neck showsn right proximal M2 occlusion just distal to where the right MCA occluded on 05/31/20. BP has been stable, LDL is 105, HgbA1c is 5.3. Urine found to be THC and cocaine positive. He is admitted to CIR on 4/9 with left sided weakness and impulsivity. Patient transferred to CIR on 09/09/2021 .  ? ?Patient currently requires min with mobility secondary to muscle weakness, decreased cardiorespiratoy endurance, unbalanced muscle activation, ataxia, and decreased coordination, hemianopsia, decreased attention to left, and decreased standing balance, decreased postural control, hemiplegia, and decreased balance strategies.  Prior to hospitalization, patient was independent  with mobility and lived with Significant other, Daughter in a Other(Comment) home.  Home access is  Level entry. ? ?Patient will benefit from skilled PT intervention to maximize safe functional mobility, minimize fall risk, and decrease caregiver burden for planned discharge home with 24 hour supervision.  Anticipate patient will benefit from follow up [place] at discharge. ? ?PT - End of Session ?Activity Tolerance: Tolerates 10 - 20 min activity with multiple rests ?Endurance Deficit: Yes ?Endurance Deficit Description: Fatigued after gait training during session ?PT Assessment ?Rehab Potential (ACUTE/IP ONLY): Good ?PT Barriers to Discharge: Decreased caregiver support;Lack of/limited  family support ?PT Patient demonstrates impairments in the following area(s): Balance;Endurance;Motor;Sensory;Safety ?PT Transfers Functional Problem(s): Bed Mobility;Bed to Chair;Car ?PT Locomotion  Functional Problem(s): Ambulation;Stairs ?PT Plan ?PT Intensity: Minimum of 1-2 x/day ,45 to 90 minutes ?PT Frequency: 5 out of 7 days ?PT Duration Estimated Length of Stay: 10-14 days ?PT Treatment/Interventions: Community reintegration;Ambulation/gait training;Balance/vestibular training;Cognitive remediation/compensation;Discharge planning;Disease management/prevention;DME/adaptive equipment instruction;Functional electrical stimulation;Functional mobility training;Neuromuscular re-education;Pain management;Patient/family education;Psychosocial support;Skin care/wound management;Splinting/orthotics;Stair training;Therapeutic Activities;Therapeutic Exercise;UE/LE Strength taining/ROM;UE/LE Coordination activities;Wheelchair propulsion/positioning;Visual/perceptual remediation/compensation ?PT Transfers Anticipated Outcome(s): supervision with LRAD ?PT Locomotion Anticipated Outcome(s): supervision with LRAD ?PT Recommendation ?Recommendations for Other Services: Therapeutic Recreation consult;Neuropsych consult ?Follow Up Recommendations: 24 hour supervision/assistance;Home health PT ?Patient destination: Home ?Equipment Recommended: To be determined ? ? ?PT Evaluation ?Precautions/Restrictions ?Precautions ?Precautions: Fall ?Precaution Comments: L hemi ?Restrictions ?Weight Bearing Restrictions: No ?General ?  Vital SignsTherapy Vitals ?Temp: 98.5 ?F (36.9 ?C) ?Pulse Rate: 60 ?Resp: 18 ?BP: 136/78 ?Patient Position (if appropriate): Sitting ?Oxygen Therapy ?SpO2: 100 % ?O2 Device: Room Air ?Pain ?Pain Assessment ?Pain Score: 0-No pain ?Pain Interference ?Pain Interference ?Pain Effect on Sleep: 1. Rarely or not at all ?Pain Interference with Therapy Activities: 1. Rarely or not at all ?Pain Interference with Day-to-Day Activities: 1. Rarely or not at all ?Home Living/Prior Functioning ?Home Living ?Living Arrangements: Children ?Available Help at Discharge: Family;Available 24 hours/day ?Type of Home:  Other(Comment) ?Home Access: Level entry ?Home Layout: One level ?Bathroom Shower/Tub: Walk-in shower;Curtain ?Bathroom Toilet: Standard ?Additional Comments: reports that they will be moving from the boarding house that is less accessible and will be moving in with pt's son upon d/c ? Lives With: Significant other;Daughter ?Prior Function ?Level of Independence: Independent with basic ADLs;Independent with gait;Independent with homemaking with ambulation ? Able to Take Stairs?: Reciprically ?Driving: Yes ?Vocation: Full time employment ?Vocation Requirements: reports working as needed when work opportunities surfaced ?Vision/Perception  ?Vision - History ?Ability to See in Adequate Light: 0 Adequate ?Vision - Assessment ?Additional Comments: L homonymous hemianopsia per neuro note - to be further assesssed ?Perception ?Perception: Impaired ?Inattention/Neglect: Does not attend to left side of body ?Praxis ?Praxis: Intact  ?Cognition ?Overall Cognitive Status: Within Functional Limits for tasks assessed ?Arousal/Alertness: Awake/alert ?Orientation Level: Oriented X4 ?Attention: Alternating ?Focused Attention: Appears intact ?Alternating Attention: Appears intact ?Memory: Appears intact ?Awareness: Appears intact ?Problem Solving: Appears intact ?Safety/Judgment: Impaired ?Sensation ?Sensation ?Light Touch: Impaired by gross assessment ?Hot/Cold: Appears Intact ?Proprioception: Impaired by gross assessment ?Stereognosis: Appears Intact ?Additional Comments: Impaired proximally vs distally on L side ?Coordination ?Gross Motor Movements are Fluid and Coordinated: No ?Coordination and Movement Description: L hemi ?Heel Shin Test: slowed on L but WFL ?Motor  ?Motor ?Motor: Other (comment) ?Motor - Skilled Clinical Observations: L hemi  ? ?Trunk/Postural Assessment  ?Cervical Assessment ?Cervical Assessment: Within Functional Limits ?Thoracic Assessment ?Thoracic Assessment: Within Functional Limits ?Lumbar  Assessment ?Lumbar Assessment: Within Functional Limits ?Postural Control ?Postural Control: Within Functional Limits  ?Balance ?Balance ?Balance Assessed: Yes ?Standardized Balance Assessment ?Standardized Balance Assessment: Timed U

## 2021-09-11 LAB — URINE CULTURE: Culture: <10000 — AB

## 2021-09-11 MED ORDER — SENNOSIDES-DOCUSATE SODIUM 8.6-50 MG PO TABS
1.0000 | ORAL_TABLET | Freq: Every day | ORAL | Status: DC
  Administered 2021-09-12 – 2021-09-19 (×8): 1 via ORAL
  Filled 2021-09-11 (×9): qty 1

## 2021-09-11 MED ORDER — ASPIRIN 81 MG PO CHEW
81.0000 mg | CHEWABLE_TABLET | Freq: Every day | ORAL | Status: DC
  Administered 2021-09-12 – 2021-09-20 (×9): 81 mg via ORAL
  Filled 2021-09-11 (×9): qty 1

## 2021-09-11 MED ORDER — CITALOPRAM HYDROBROMIDE 20 MG PO TABS
20.0000 mg | ORAL_TABLET | Freq: Every day | ORAL | Status: DC
  Administered 2021-09-11 – 2021-09-20 (×10): 20 mg via ORAL
  Filled 2021-09-11 (×10): qty 1

## 2021-09-11 MED ORDER — NICOTINE 7 MG/24HR TD PT24
7.0000 mg | MEDICATED_PATCH | Freq: Every day | TRANSDERMAL | Status: DC
  Administered 2021-09-11 – 2021-09-20 (×10): 7 mg via TRANSDERMAL
  Filled 2021-09-11 (×10): qty 1

## 2021-09-11 MED FILL — Tenecteplase For IV Soln Kit 50 MG: INTRAVENOUS | Qty: 31 | Status: AC

## 2021-09-11 MED FILL — Tenecteplase For IV Soln Kit 50 MG: INTRAVENOUS | Qty: 19 | Status: AC

## 2021-09-11 NOTE — Progress Notes (Signed)
Speech Language Pathology Discharge Summary ? ?Patient Details  ?Name: Mark Herrera ?MRN: [id number] ?Date of Birth: 1961/03/12 ? ?Today's Date: 09/11/2021 ?SLP Individual Time: 3005-1102 ?SLP Individual Time Calculation (min): 45 min ? ? ?Skilled Therapeutic Interventions:  ?Pt seen, this AM, for skilled ST intervention targeting speech intelligibility goals outlined in care plan. Pt encountered awake/alert and OOB in recliner chair. Reports feeling slightly depressed due to his current medical status. SLP offered verbal support and reassurance of his cognitive-linguistic functioning and overall speech intelligibility s/p CVA; he verbalized appreciation. No c/o pain. Agreeable to ST intervention at bedside. ? ?SLP facilitated today's session by providing compensatory strategy training for speech intelligibility (e.g. SLOP strategies), in addition to fatigue management/energy conservation as it relates to speech clarity, general education re: dysarthria s/p CVA, and environmental modifications/factors to consider with subtle changes in speech intelligibility. Pt actively participated in all education. Verbalized and demonstrated understanding of strategies; independently achieved 95% intelligibility at the conversational level with background noise present. Pt verbalized appreciation of education and stated that he feels his speech has improved since initial evaluation on 09/10/2021. Cognition remains intact. ? ?Session concluded with direct hand-off to PT. Pt to be discharged from skilled ST intervention due to pt meeting LTG.  ? ?Patient has met 1 of 1 long term goals.  Patient to discharge at overall Independent level.  ?Reasons goals not met:  N/A ? ?Clinical Impression/Discharge Summary: Pt actively participated in compensatory speech intelligibility training, dysarthria education, and use of environmental modifications if needed to aid in comprehensibility of intended verbal messages. Pt was 95% intelligible  at the conversational level with no intervention and self-corrected independently. Education is complete and LTG has been achieved. No further ST intervention is indicated at this time, nor is f/u upon CIR discharge. Thank you for this consult. ? ?Care Partner:  ?Caregiver Able to Provide Assistance: Yes  ?Type of Caregiver Assistance: Cognitive ? ?Recommendation:  ?None  ?   ?Equipment: N/A  ? ?Reasons for discharge: Treatment goals met  ? ?Patient/Family Agrees with Progress Made and Goals Achieved: Yes  ? ? ?Torin Whisner A Sharonna Vinje ?09/11/2021, 12:35 PM ? ?

## 2021-09-11 NOTE — Progress Notes (Signed)
Physical Therapy Session Note ? ?Patient Details  ?Name: Mark Herrera ?MRN: [id number] ?Date of Birth: [birth year] ? ?Today's Date: 09/11/2021 ?PT Individual Time: 0900-1000; [id number] ?PT Therapy Time: 60 min; 30 min ? ?Short Term Goals: ?Week 1:  PT Short Term Goal 1 (Week 1): Pt will complete bed<>chair transfers with CGA and LRAD ?PT Short Term Goal 2 (Week 1): Pt will ambulate 16ft with CGA and LRAD ?PT Short Term Goal 3 (Week 1): Pt will complete outcome measure to assess falls risk ? ? ?Skilled Therapeutic Interventions/Progress Updates:  ?Session 1: ?Pt received sitting in recliner and agreeable to therapy. Pt denies pain but mainly reports sense of heaviness of LUE/LLE with use. CGA stand-pivot transfers to and from w/c with RW with frequent verbal cuing for safe hand placement on walker and w/c before standing. Pt transported to therapy gym in w/c for time management. Gait training initiated for 1 trial x77ft minA with RW and with shoe cover on L foot to assist foot clearance d/t foot drop -- requires verbal cuing for maintaining wider BOS with foot placement, staying centered within RW, maintaining erect posture/head upright. Reported a 6 RPE after ambulation. Pt states that he has always assumed a forward flexed posture for years, but understands the benefit of trying to correct posture for improved safety awareness and stability with functional activities. Dynamic standing balance initiated with RW minA, performing alt step-taps onto 3" box -- with visual target to address proprioception and with verbal cuing for upright posture. Pt transported back to room in w/c for time management. Stand-pivot txfer with RW CGA back to recliner. Pt left in recliner with all needs in reach and safety alarm active. ? ?Session 2: ?Pt received lying in bed asleep. Woken by girlfriend and agreeable to therapy. Pt did not report pain but did express the LLE feeling heavier this afternoon d/t fatigue. Supervision for bed  mobility. Pt attempted to don shoes with CGA and trunk maximally flexed fwd but experienced LOB on EOB d/t poor safety awareness; able to recover balance with modA. CGA stand-pivot transfer with RW > w/c with improved safety awareness for hand-placement during STS. Pt transported to therapy gym in w/c for time management. Standing dynamic balance activities with RW minA initiated to address BLE stability, coordination, and proprioception: alt step-taps onto visual target, cone taps, stepping fwd/backward over yardstick. Pt requires increased time to perform tasks and demonstrates significant reliance on RW for stability but attempts to correct his own posture in order to decrease reliance on UE support. Pt transported back to room in w/c for time management. Stand-pivot transfer with RW CGA back to bed. Pt left in bed with HOB elevated, safety alarm active, and all needs in reach. ? ?Therapy Documentation ?Precautions:  ?Precautions ?Precautions: Fall ?Precaution Comments: L hemi ?Restrictions ?Weight Bearing Restrictions: No ? ? ?Therapy/Group: Individual Therapy ? ?Lanetta Inch ?09/11/2021, 12:11 PM  ?

## 2021-09-11 NOTE — Progress Notes (Signed)
?                                                       PROGRESS NOTE ? ? ?Subjective/Complaints: ? ? ?Emotional over not being independent anymore.  ?No other issues ?LBM yesterday.  ?Asking for nicotine patch.  ?Also depressed mood.  ? ?ROS: ? ?Pt denies SOB, abd pain, CP, N/V/C/D, and vision changes ? ? ?Objective: ?  ?No results found. ?Recent Labs  ?  09/10/21 ?0637  ?WBC 5.3  ?HGB 14.2  ?HCT 43.9  ?PLT 205  ? ?Recent Labs  ?  09/10/21 ?0637  ?NA 140  ?K 4.3  ?CL 106  ?CO2 28  ?GLUCOSE 106*  ?BUN 12  ?CREATININE 1.02  ?CALCIUM 8.8*  ? ? ?Intake/Output Summary (Last 24 hours) at 09/11/2021 0920 ?Last data filed at 09/11/2021 8295 ?Gross per 24 hour  ?Intake 960 ml  ?Output 200 ml  ?Net 760 ml  ?  ? ?  ? ?Physical Exam: ?Vital Signs ?Blood pressure 120/74, pulse (!) 56, temperature 97.7 ?F (36.5 ?C), temperature source Oral, resp. rate 14, height 5\' 9"  (1.753 m), weight 72.3 kg, SpO2 100 %. ? ? ? ?General: awake, alert, appropriate, sitting up in bedside chair; NAD ?HENT: conjugate gaze; oropharynx moist ?CV: regular /borderline rate; no JVD ?Pulmonary: CTA B/L; no W/R/R- good air movement ?GI: soft, NT, ND, (+)BS ?Psychiatric: appropriate, depressed ?Neurological: Ox3 ?Skin: intact ?Neuro/Musculoskeletal: Left homonymous hemianopsia. Sensation intact. LUE drift. Impaired LUE coordination. 4/5 strength throughout left side. Right sided strength intact.  ?  ? ? ?Assessment/Plan: ?1. Functional deficits which require 3+ hours per day of interdisciplinary therapy in a comprehensive inpatient rehab setting. ?Physiatrist is providing close team supervision and 24 hour management of active medical problems listed below. ?Physiatrist and rehab team continue to assess barriers to discharge/monitor patient progress toward functional and medical goals ? ?Care Tool: ? ?Bathing ?   ?Body parts bathed by patient: Right arm, Left arm, Chest, Abdomen, Front perineal area, Right upper leg, Left upper leg, Face  ? Body parts  bathed by helper: Right lower leg, Left lower leg, Buttocks ?  ?  ?Bathing assist Assist Level: Moderate Assistance - Patient 50 - 74% ?  ?  ?Upper Body Dressing/Undressing ?Upper body dressing   ?What is the patient wearing?: Pull over shirt ?   ?Upper body assist Assist Level: Moderate Assistance - Patient 50 - 74% ?   ?Lower Body Dressing/Undressing ?Lower body dressing ? ? ?   ?What is the patient wearing?: Pants ? ?  ? ?Lower body assist Assist for lower body dressing: Moderate Assistance - Patient 50 - 74% ?   ? ?Toileting ?Toileting    ?Toileting assist Assist for toileting: Moderate Assistance - Patient 50 - 74% ?  ?  ?Transfers ?Chair/bed transfer ? ?Transfers assist ?   ? ?Chair/bed transfer assist level: Minimal Assistance - Patient > 75% ?  ?  ?Locomotion ?Ambulation ? ? ?Ambulation assist ? ?   ? ?Assist level: Minimal Assistance - Patient > 75% ?Assistive device: Walker-rolling ?Max distance: 16ft  ? ?Walk 10 feet activity ? ? ?Assist ?   ? ?Assist level: Minimal Assistance - Patient > 75% ?Assistive device: Walker-rolling  ? ?Walk 50 feet activity ? ? ?Assist   ? ?Assist level: Minimal Assistance - Patient >  75% ?Assistive device: Walker-rolling  ? ? ?Walk 150 feet activity ? ? ?Assist Walk 150 feet activity did not occur: Safety/medical concerns ? ?  ?  ?  ? ?Walk 10 feet on uneven surface  ?activity ? ? ?Assist Walk 10 feet on uneven surfaces activity did not occur: Safety/medical concerns ? ? ?  ?   ? ?Wheelchair ? ? ? ? ?Assist Is the patient using a wheelchair?: No ?  ?  ? ?  ?   ? ? ?Wheelchair 50 feet with 2 turns activity ? ? ? ?Assist ? ?  ?  ? ? ?   ? ?Wheelchair 150 feet activity  ? ? ? ?Assist ?   ? ? ?   ? ?Blood pressure 120/74, pulse (!) 56, temperature 97.7 ?F (36.5 ?C), temperature source Oral, resp. rate 14, height 5\' 9"  (1.753 m), weight 72.3 kg, SpO2 100 %. ? ?Medical Problem List and Plan: ?1. Functional deficits secondary to right MCA infarction ?            -patient may shower ?             -ELOS/Goals: 10-14 days S ?            -team conference today- to determine length of stay ? Continue CIR- PT, OT and SLP ?2.  High grade intracranial stenosis: continue aspirin and plavix (will need total 90 days) and DVT prophylaxis with SCDs.  ?3. HLD: LDL is 105, continue atorvastatin 40mg  ?4. Situational depression: provide encouragement regarding improvements in sensation and strength already.  ? 4/11- will start Celexa 20 mg daily.  ?5. Neuropsych: This patient is capable of making decisions on his own behalf. ?6. Impulsivity: SLP ordered ?7. Active smoker: continue smoking cessation counseling ? 4/11- will start Nicotine patch 7 mcg daily ?8. Active THC use: continue providing cessation counseling ?9. Active cocaine use: continue providing cessation counseling ?10. History of prior R MCA stroke: provide stroke prevention counseling ?11. Hyperglycemia: HgbA1c is 5.3, educate regarding low added sugar in diet.  ?12. Hypoalbuminemia: educated regarding low sugar/high protein diet.  ?13. Urinary frequency/urgency/accidents- ? 4/10- will check U/A and Cx to check for UTI. No Dysuria ?  4/11- Rare bacteria and trace leukocytes- no other Sx's- won't treat unless Cx shows something different ? ? ?I spent a total of 41    minutes on total care today- >50% coordination of care- due to team conference as well as prolonged d/w pt and nursing about patch.  ? ? ?LOS: ?2 days ?A FACE TO FACE EVALUATION WAS PERFORMED ? ?Jamela Cumbo ?09/11/2021, 9:20 AM  ? ? ? ?

## 2021-09-11 NOTE — Patient Care Conference (Signed)
Inpatient RehabilitationTeam Conference and Plan of Care Update ?Date: 09/11/2021   Time: 11:04 AM ? ? ?Patient Name: Mark Herrera      ?Medical Record Number: [id number]  ?Date of Birth: 12-Aug-1960 ?Sex: Male         ?Room/Bed: 7W29F/6O13Y-86 ?Payor Info: Payor: MEDICAID POTENTIAL / Plan: MEDICAID POTENTIAL / Product Type: *No Product type* /   ? ?Admit Date/Time:  09/09/2021 11:50 AM ? ?Primary Diagnosis:  CVA (cerebral vascular accident) ([facility]) ? ?Hospital Problems: Principal Problem: ?  CVA (cerebral vascular accident) ([facility]) ? ? ? ?Expected Discharge Date: Expected Discharge Date: 09/21/21 ? ?Team Members Present: ?Physician leading conference: Dr. Genice Rouge ?Social Worker Present: Cecile Sheerer, LCSWA ?Nurse Present: Kennyth Arnold, RN ?PT Present: Peter Congo, PT ?OT Present: Ardis Rowan, Jaynee Eagles, OT ?SLP Present: Gerda Diss, SLP ?PPS Coordinator present : Cheri Rous, OT ? ?   Current Status/Progress Goal Weekly Team Focus  ?Bowel/Bladder ? ? continent b/b with incontinent episodes  regain continence  toilet q 2 hr and prn   ?Swallow/Nutrition/ Hydration ? ?           ?ADL's ? ? min A/mod A BADLs and functional tranfsers  supervision overall  BADL retraining, functional tranfsers, education   ?Mobility ? ? min A transfers, min A gait x 70 ft, min A stairs  Supervision overall  L NMR, gait, AFO assessment   ?Communication ? ? supervision A 90% intelligibility in conversation ( misarticulation)  mod I  education on speech intelligibility strategies and then d/c   ?Safety/Cognition/ Behavioral Observations ?           ?Pain ? ? no reported pain  < 3  assess pain q 4hr and prn   ?Skin ? ? CDI  no new breakdown  assess skin q shift and prn   ? ? ?Discharge Planning:  ?Uninsured. D/c to home with his son Mark Herrera and support from various family members.   ?Team Discussion: ?This is the 3rd stroke. Added Nicotine patch, and Celexa for depression. Recommending lifestyle change. Incontinent  episodes, no pain reported. UA negative, awaiting culture results. Discharging home with son. Uninsured. Girlfriend has been present. ? ?Patient on target to meet rehab goals: ?yes, supervision goals. Currently min assist overall. May need AFO, consult ordered. Min/mod assist with ADL's, min assist transfers. Will assess vision. Working on Systems developer.  ? ?*See Care Plan and progress notes for long and short-term goals.  ? ?Revisions to Treatment Plan:  ?Adjusting medications ?  ?Teaching Needs: ?Family education, medication management, safety awareness, transfer/gait training, etc. ?  ?Current Barriers to Discharge: ?Decreased caregiver support, Home enviroment access/layout, New diabetic, Incontinence, Lack of/limited family support, Medication compliance, and polysubstance abuse. ? ?Possible Resolutions to Barriers: ?Family education ?Follow up PT/OT/SLP ?Order recommended DME ?  ? ? Medical Summary ?Current Status: emotional over loss of independence- some incontinence of B/B- no pain- skin good- GF here a lot- ? Barriers to Discharge: Decreased family/caregiver support;Home enviroment access/layout;Medical stability;Weight bearing restrictions;Incontinence;Neurogenic Bowel & Bladder ? Barriers to Discharge Comments: no insurance- going to son's Mark Herrera house and help from GF; other kids ?Possible Resolutions to [employer]: barriers- depression- started Celexa- and nicotine patch started- min A right now- will need LLE AFO- Supervision goals- d/c form SLP after 1 treatment for speech- d/c- 4/21 ? ? ?Continued Need for Acute Rehabilitation Level of Care: The patient requires daily medical management by a physician with specialized training in physical medicine and rehabilitation for the following  reasons: ?Direction of a multidisciplinary physical rehabilitation program to maximize functional independence : Yes ?Medical management of patient stability for increased activity during  participation in an intensive rehabilitation regime.: Yes ?Analysis of laboratory values and/or radiology reports with any subsequent need for medication adjustment and/or medical intervention. : Yes ? ? ?I attest that I was present, lead the team conference, and concur with the assessment and plan of the team. ? ? ?Kennyth Arnold G ?09/11/2021, 3:10 PM  ? ? ? ? ? ? ?

## 2021-09-11 NOTE — Progress Notes (Signed)
Occupational Therapy Session Note ? ?Patient Details  ?Name: Mark Herrera ?MRN: [id number] ?Date of Birth: 1960-11-24 ? ?Today's Date: 09/11/2021 ?OT Individual Time: [id number] ?OT Individual Time Calculation (min): 28 min  ? ? ?Short Term Goals: ?Week 1:  OT Short Term Goal 1 (Week 1): Pt will complete 1/3 toileting steps with no more than min cues ?OT Short Term Goal 2 (Week 1): Pt will complete sit > stand in prep for ADL with min A ?OT Short Term Goal 3 (Week 1): Pt will donn UB shirt with min A with min cues needed for hemi-technique ? ?Skilled Therapeutic Interventions/Progress Updates:  ?  Pt resting in recliner upon arrival. OT intervention with focus on functional amb with RW in room envirionment-min A. Max verbal cues for attending to LUE and maintaining LUE on RW. Mod verbal cues to attend to objects in Lt visual field. Sit<>stand with CGA and max verbal cues for UE positioning and sequencing. Pt remained in recliner with all needs within reach.  ? ?Therapy Documentation ?Precautions:  ?Precautions ?Precautions: Fall ?Precaution Comments: L hemi ?Restrictions ?Weight Bearing Restrictions: No ? ?Pain: ? Pt denies pain this morning ? ? ?Therapy/Group: Individual Therapy ? ?[name] ?09/11/2021, 12:05 PM ?

## 2021-09-11 NOTE — Care Management (Signed)
[facility] ?Individual Statement of Services ? ?Patient Name:  JYLAN LOEZA  ?Date:  09/11/2021 ? ?Welcome to the [facility].  Our goal is to provide you with an individualized program based on your diagnosis and situation, designed to meet your specific needs.  With this comprehensive rehabilitation program, you will be expected to participate in at least 3 hours of rehabilitation therapies Monday-Friday, with modified therapy programming on the weekends. ? ?Your rehabilitation program will include the following services:  Physical Therapy (PT), Occupational Therapy (OT), Speech Therapy (ST), 24 hour per day rehabilitation nursing, Therapeutic Recreaction (TR), Psychology, Neuropsychology, Care Coordinator, Rehabilitation Medicine, Nutrition Services, Pharmacy Services, and Other ? ?Weekly team conferences will be held on Tuesdays to discuss your progress.  Your Inpatient Rehabilitation Care Coordinator will talk with you frequently to get your input and to update you on team discussions.  Team conferences with you and your family in attendance may also be held. ? ?Expected length of stay: 10-14 days   ? ?\Overall anticipated outcome: Supervision ? ?Depending on your progress and recovery, your program may change. Your Inpatient Rehabilitation Care Coordinator will coordinate services and will keep you informed of any changes. Your Inpatient Rehabilitation Care Coordinator's name and contact numbers are listed  below. ? ?The following services may also be recommended but are not provided by the [facility]:  ?Driving Evaluations ?Home Health Rehabiltiation Services ?Outpatient Rehabilitation Services ?Vocational Rehabilitation ?  ?Arrangements will be made to provide these services after discharge if needed.  Arrangements include referral to agencies that provide these services. ? ?Your insurance has been verified to be:  Uninsured ? ?Your primary  doctor is:  No PCP ? ?Pertinent information will be shared with your doctor and your insurance company. ? ?Inpatient Rehabilitation Care Coordinator:  Susie Cassette [phone] or (C) [phone] ? ?Information discussed with and copy given to patient by: Gretchen Short, 09/11/2021, 12:38 PM    ?

## 2021-09-11 NOTE — Plan of Care (Signed)
?  Problem: RH Expression Communication ?Goal: LTG Patient will increase speech intelligibility (SLP) ?Description: LTG: Patient will increase speech intelligibility at word/phrase/conversation level with cues, % of the time (SLP) ?Outcome: Completed/Met ?Flowsheets ?Taken 09/11/2021 1247 by Helaine Chess A, CCC-SLP ?Level: Conversation level ?Taken 09/10/2021 1403 by Charolett Bumpers, [place] ?LTG: Patient will increase speech intelligibility (SLP): Modified Independent ?Percent of time patient will use intelligible speech: 95% ?  ?

## 2021-09-11 NOTE — Progress Notes (Signed)
Occupational Therapy Session Note ? ?Patient Details  ?Name: CARLEY STRICKLING ?MRN: [id number] ?Date of Birth: 1960-10-28 ? ?Today's Date: 09/11/2021 ?OT Individual Time: 1030-1057 ?OT Individual Time Calculation (min): 27 min  ? ? ?Short Term Goals: ?Week 1:  OT Short Term Goal 1 (Week 1): Pt will complete 1/3 toileting steps with no more than min cues ?OT Short Term Goal 2 (Week 1): Pt will complete sit > stand in prep for ADL with min A ?OT Short Term Goal 3 (Week 1): Pt will donn UB shirt with min A with min cues needed for hemi-technique ? ?Skilled Therapeutic Interventions/Progress Updates:  ?  Pt resting in recliner upon arrival and agreeable to therapy. OT intervention with focus on sit<>stand, standing balance, LUE function, discharge planning, and safety awareness to increase independence with BADLs. Sit<>stand and standing balance with CGA. Pt with LUE weakness and FMC deficits. Pt denies visual changes; will assess further in functional context. Pt issued green foam block for grasp strengthening. Pt has walk in shower at home. Pt agreeable to shower tomorrow and remarked that he would like to try standing for shower. Pt remained in relciner with all needs within reach.  ? ?Therapy Documentation ?Precautions:  ?Precautions ?Precautions: Fall ?Precaution Comments: L hemi ?Restrictions ?Weight Bearing Restrictions: No ? ?Pain: ?Pain Assessment ?Pain Scale: 0-10 ?Pain Score: 0-No pain ? ? ?Therapy/Group: Individual Therapy ? ?Rich Brave ?09/11/2021, 10:58 AM ?

## 2021-09-11 NOTE — Progress Notes (Signed)
Patient ID: Mark Herrera, male   DOB: [birth year], 61 y.o.   MRN: [id number] ? ? ?SW met with pt and pt partner Mark Herrera to provide updates from team conference, and d/c date 4/21. SW provided pt with appt card for hospital f/u appt on 5/10 at [facility] at 2:30pm. Pt aware he will be screened for MCD/disability while here and still waiting at this time.  ? ?63- SW spoke with pt son Mark Herrera to inform on above. SW informed there will continue to be updates.  ? ?Loralee Pacas, MSW, LCSWA ?Office: [phone] ?Cell: [phone] ?Fax: [phone]  ?

## 2021-09-12 NOTE — Progress Notes (Signed)
Physical Therapy Session Note ? ?Patient Details  ?Name: Mark Herrera ?MRN: [id number] ?Date of Birth: Nov 25, 1960 ? ?Today's Date: 09/12/2021 ?PT Individual Time: 2751-7001 ?PT Individual Time Calculation (min): 45 min  ? ?Short Term Goals: ?Week 1:  PT Short Term Goal 1 (Week 1): Pt will complete bed<>chair transfers with CGA and LRAD ?PT Short Term Goal 2 (Week 1): Pt will ambulate 176ft with CGA and LRAD ?PT Short Term Goal 3 (Week 1): Pt will complete outcome measure to assess falls risk  ? ?Skilled Therapeutic Interventions/Progress Updates:  ?Pt received asleep sitting in w/c, with girlfriend present. Once woken, pt was agreeable to therapy. CGA for STS and stand-pivot transfers with RW, required less verbal cuing for safe hand placement, but still required verbal cuing to keep RW close to him when turning.  ? ?- Gait training initiated with RW CGA for 2 trials x100 ft with 180 deg turn and required 1 seated rest break between trials d/t limited endurance. Pt demonstrated improved L foot clearance along with increased heel-strike, but would intermittently "drag" L foot (unknowingly) d/t decreased ankle DF/hip and knee flex, and with fatigue. Also required verbal cuing to remain in the center of RW for stability. Pt is aware he tends to try and move too fast and becomes less cognizant of his L foot placement, posture, and positioning of RW. Pt will occasionally attempt to self-correct posture during gait but continues to require verbal cuing. ? ?- Standing dynamic balance activity performed with RW CGA: elevated 1 LE on 3" step to increase WB and use of the LE on the ground, while engaged in UE reaching (1UE support on RW) with clothes pins to facilitate forced use of the LUE, trunk stability, and balance. When reaching with LUE, pt reports significant increase in L shoulder pain (5/10) that he has not experienced in the past. Painful with AROM and PROM. Pt did express that he has had hx or dislocation on the  L shoulder and understands the recently decreased use of the LUE may be contributing to pain as well.  ? ?Pt transported back to room in w/c for time management. Per pt request, pt left in w/c with applied short-acting heat pack placed over the L shoulder and PT advised pt to remove it if he begins to experience discomfort before the heat wears off. Girlfriend present, safety alarm active, and all needs in reach. ? ?Therapy Documentation ?Precautions:  ?Precautions ?Precautions: Fall ?Precaution Comments: L hemi ?Restrictions ?Weight Bearing Restrictions: No ? ? ? ? ?Therapy/Group: Individual Therapy ? ?Charmian Muff ?09/12/2021, 2:14 PM  ?

## 2021-09-12 NOTE — Progress Notes (Signed)
?                                                       PROGRESS NOTE ? ? ?Subjective/Complaints: ?No new complaints this morning ?Seems to be much more bright than when I last saw him ?Girlfriend at bedside ?Going out on grounds during lunch break ? ?ROS: ? ?Pt denies SOB, abd pain, CP, N/V/C/D, and vision changes, +weakness ? ? ?Objective: ?  ?No results found. ?Recent Labs  ?  09/10/21 ?0637  ?WBC 5.3  ?HGB 14.2  ?HCT 43.9  ?PLT 205  ? ?Recent Labs  ?  09/10/21 ?0637  ?NA 140  ?K 4.3  ?CL 106  ?CO2 28  ?GLUCOSE 106*  ?BUN 12  ?CREATININE 1.02  ?CALCIUM 8.8*  ? ? ?Intake/Output Summary (Last 24 hours) at 09/12/2021 1304 ?Last data filed at 09/12/2021 1007 ?Gross per 24 hour  ?Intake 595 ml  ?Output 300 ml  ?Net 295 ml  ?  ? ?  ? ?Physical Exam: ?Vital Signs ?Blood pressure 119/70, pulse 65, temperature 99.2 ?F (37.3 ?C), temperature source Oral, resp. rate 18, height 5\' 9"  (1.753 m), weight 72.3 kg, SpO2 100 %. ?Gen: no distress, normal appearing ?HEENT: oral mucosa pink and moist, NCAT ?Cardio: Reg rate ?Chest: normal effort, normal rate of breathing ?Abd: soft, non-distended ?Ext: no edema ?Psych: pleasant, normal affect ?Skin: intact ?Neuro/Musculoskeletal: Left homonymous hemianopsia. Sensation intact. LUE drift. Impaired LUE coordination. 4/5 strength throughout left side. Right sided strength intact.  ?  ? ? ?Assessment/Plan: ?1. Functional deficits which require 3+ hours per day of interdisciplinary therapy in a comprehensive inpatient rehab setting. ?Physiatrist is providing close team supervision and 24 hour management of active medical problems listed below. ?Physiatrist and rehab team continue to assess barriers to discharge/monitor patient progress toward functional and medical goals ? ?Care Tool: ? ?Bathing ?   ?Body parts bathed by patient: Right arm, Left arm, Chest, Abdomen, Front perineal area, Buttocks, Right upper leg, Left upper leg, Right lower leg, Left lower leg, Face  ? Body parts bathed by  helper: Right lower leg, Left lower leg, Buttocks ?  ?  ?Bathing assist Assist Level: Contact Guard/Touching assist ?  ?  ?Upper Body Dressing/Undressing ?Upper body dressing   ?What is the patient wearing?: Pull over shirt ?   ?Upper body assist Assist Level: Supervision/Verbal cueing ?   ?Lower Body Dressing/Undressing ?Lower body dressing ? ? ?   ?What is the patient wearing?: Pants ? ?  ? ?Lower body assist Assist for lower body dressing: Contact Guard/Touching assist ?   ? ?Toileting ?Toileting    ?Toileting assist Assist for toileting: Moderate Assistance - Patient 50 - 74% ?  ?  ?Transfers ?Chair/bed transfer ? ?Transfers assist ?   ? ?Chair/bed transfer assist level: Minimal Assistance - Patient > 75% ?  ?  ?Locomotion ?Ambulation ? ? ?Ambulation assist ? ?   ? ?Assist level: Minimal Assistance - Patient > 75% ?Assistive device: Walker-rolling ?Max distance: 70 ft  ? ?Walk 10 feet activity ? ? ?Assist ?   ? ?Assist level: Minimal Assistance - Patient > 75% ?Assistive device: Walker-rolling  ? ?Walk 50 feet activity ? ? ?Assist   ? ?Assist level: Minimal Assistance - Patient > 75% ?Assistive device: Walker-rolling  ? ? ?Walk 150 feet activity ? ? ?  Assist Walk 150 feet activity did not occur: Safety/medical concerns ? ?  ?  ?  ? ?Walk 10 feet on uneven surface  ?activity ? ? ?Assist Walk 10 feet on uneven surfaces activity did not occur: Safety/medical concerns ? ? ?  ?   ? ?Wheelchair ? ? ? ? ?Assist Is the patient using a wheelchair?: No ?  ?  ? ?  ?   ? ? ?Wheelchair 50 feet with 2 turns activity ? ? ? ?Assist ? ?  ?  ? ? ?   ? ?Wheelchair 150 feet activity  ? ? ? ?Assist ?   ? ? ?   ? ?Blood pressure 119/70, pulse 65, temperature 99.2 ?F (37.3 ?C), temperature source Oral, resp. rate 18, height 5\' 9"  (1.753 m), weight 72.3 kg, SpO2 100 %. ? ?Medical Problem List and Plan: ?1. Functional deficits secondary to right MCA infarction ?            -patient may shower ?            -ELOS/Goals: 10-14 days  S ? Continue CIR- PT, OT and SLP ?2.  High grade intracranial stenosis: continue aspirin and plavix (will need total 90 days) and DVT prophylaxis with SCDs.  ?3. HLD: LDL is 105, continue atorvastatin 40mg  ?4. Situational depression: provide encouragement regarding improvements in sensation and strength already.  ? Continue Celexa 20 mg daily, started 4/11. ?5. Neuropsych: This patient is capable of making decisions on his own behalf. ?6. Impulsivity: SLP ordered ?7. Active smoker: continue smoking cessation counseling ? 4/11- will start Nicotine patch 7 mcg daily ?8. Active THC use: continue providing cessation counseling ?9. Active cocaine use: continue providing cessation counseling ?10. History of prior R MCA stroke: provide stroke prevention counseling ?11. Hyperglycemia: HgbA1c is 5.3, educate regarding low added sugar in diet.  ?12. Hypoalbuminemia: educated regarding low sugar/high protein diet.  ?13. Urinary frequency/urgency/accidents- ? 4/10- will check U/A and Cx to check for UTI. No Dysuria ?  4/11- Rare bacteria and trace leukocytes- no other Sx's- won't treat unless Cx shows something different ? ?LOS: ?3 days ?A FACE TO FACE EVALUATION WAS PERFORMED ? ?Martha Clan P Ricardo Kayes ?09/12/2021, 1:04 PM  ? ? ? ?

## 2021-09-12 NOTE — IPOC Note (Signed)
Overall Plan of Care (IPOC) ?Patient Details ?Name: Mark Herrera ?MRN: [id number] ?DOB: June 02, 1961 ? ?Admitting Diagnosis: CVA (cerebral vascular accident) (HCC) ? ?Hospital Problems: Principal Problem: ?  CVA (cerebral vascular accident) (HCC) ? ? ? ? Functional Problem List: ?Nursing Endurance, Medication Management, Safety, Motor, Bowel  ?PT Balance, Endurance, Motor, Sensory, Safety  ?OT Balance, Cognition, Endurance, Motor, Pain, Safety, Sensory, Vision  ?SLP    ?TR    ?    ? Basic ADL?s: ?OT Grooming, Bathing, Dressing, Toileting  ? ?  Advanced  ADL?s: ?OT    ?   ?Transfers: ?PT Bed Mobility, Bed to Chair, Car  ?OT Toilet, Tub/Shower  ? ?  Locomotion: ?PT Ambulation, Stairs  ? ?  Additional Impairments: ?OT Fuctional Use of Upper Extremity  ?SLP Communication ?expression ?   ?TR    ? ? ?Anticipated Outcomes ?Item Anticipated Outcome  ?Self Feeding Mod I  ?Swallowing ?   ?  ?Basic self-care ? Supervision to CGA  ?Toileting ? CGA ?  ?Bathroom Transfers CGA  ?Bowel/Bladder ? Supervision  ?Transfers ? supervision with LRAD  ?Locomotion ? supervision with LRAD  ?Communication ? Mod I  ?Cognition ?    ?Pain ? n/a  ?Safety/Judgment ? to remain fall free while in rehab  ? ?Therapy Plan: ?PT Intensity: Minimum of 1-2 x/day ,45 to 90 minutes ?PT Frequency: 5 out of 7 days ?PT Duration Estimated Length of Stay: 10-14 days ?OT Intensity: Minimum of 1-2 x/day, 45 to 90 minutes ?OT Frequency: 5 out of 7 days ?OT Duration/Estimated Length of Stay: 10-14 days ?SLP Intensity: Minumum of 1-2 x/day, 30 to 90 minutes ?SLP Frequency: 1 to 3 out of 7 days ?SLP Duration/Estimated Length of Stay: 14-16 days  ? ?Due to the current state of emergency, patients may not be receiving their 3-hours of Medicare-mandated therapy. ? ? Team Interventions: ?Nursing Interventions Patient/Family Education, Disease Management/Prevention, Medication Management, Discharge Planning, Psychosocial Support, Bowel Management  ?PT interventions  Community reintegration, Ambulation/gait training, Warden/ranger, Cognitive remediation/compensation, Discharge planning, Disease management/prevention, DME/adaptive equipment instruction, Functional electrical stimulation, Functional mobility training, Neuromuscular re-education, Pain management, Patient/family education, Psychosocial support, Skin care/wound management, Splinting/orthotics, Stair training, Therapeutic Activities, Therapeutic Exercise, UE/LE Strength taining/ROM, UE/LE Coordination activities, Wheelchair propulsion/positioning, Visual/perceptual remediation/compensation  ?OT Interventions Balance/vestibular training, Discharge planning, Functional electrical stimulation, Pain management, Self Care/advanced ADL retraining, Therapeutic Activities, UE/LE Coordination activities, Functional mobility training, Patient/family education, Therapeutic Exercise, Community reintegration, DME/adaptive equipment instruction, Neuromuscular re-education, Psychosocial support, UE/LE Strength taining/ROM, Wheelchair propulsion/positioning, Splinting/orthotics  ?SLP Interventions Speech/Language facilitation, Internal/external aids  ?TR Interventions    ?SW/CM Interventions Discharge Planning, Psychosocial Support, Patient/Family Education  ? ?Barriers to Discharge ?MD  Medical stability  ?Nursing Decreased caregiver support, Lack of/limited family support, Insurance for SNF coverage, Medication compliance, Incontinence ?1 level, level entry. Son can provide min assist at discharge. Hx. polysubstance abuse and tobacco abuse.  ?PT Decreased caregiver support, Lack of/limited family support ?   ?OT Home environment access/layout ?   ?SLP   ?   ?SW Decreased caregiver support, Lack of/limited family support ?Pt is uninsured; limited natural supports due to work schedules during the day  ? ?Team Discharge Planning: ?Destination: PT-Home ,OT- Home , SLP-Home ?Projected Follow-up: PT-24 hour  supervision/assistance, Home health PT, OT-  Home health OT, 24 hour supervision/assistance, SLP-None ?Projected Equipment Needs: PT-To be determined, OT- To be determined, SLP-None recommended by SLP ?Equipment Details: PT- , OT-  ?Patient/family involved in discharge planning: PT- Patient,  OT-Patient, Family member/caregiver, SLP-Patient ? ?  MD ELOS: 10-14 days ?Medical Rehab Prognosis:  Excellent ?Assessment: The patient has been admitted for CIR therapies with the diagnosis of right MCA infarction. The team will be addressing functional mobility, strength, stamina, balance, safety, adaptive techniques and equipment, self-care, bowel and bladder mgt, patient and caregiver education. Goals have been set at supervision. Anticipated discharge destination is home.  ? ?See Team Conference Notes for weekly updates to the plan of care  ?

## 2021-09-12 NOTE — Progress Notes (Signed)
Physical Therapy Session Note ? ?Patient Details  ?Name: Mark Herrera ?MRN: [id number] ?Date of Birth: [birth year] ? ?Today's Date: 09/12/2021 ?PT Individual Time: 4696-2952 ?PT Individual Time Calculation (min): 45 min  ? ?Short Term Goals: ?Week 1:  PT Short Term Goal 1 (Week 1): Pt will complete bed<>chair transfers with CGA and LRAD ?PT Short Term Goal 2 (Week 1): Pt will ambulate 113f with CGA and LRAD ?PT Short Term Goal 3 (Week 1): Pt will complete outcome measure to assess falls risk ? ?Skilled Therapeutic Interventions/Progress Updates: Pt presented in bed with GF present agreeable to therapy. KJuliann Pulse PTA present throughout session for unit orientation. Pt performed bed mobility with supervision and use of bed features. Pt donned shoes and ties laces with increased time and supervision. Performed Sit to stand with CGA and RW with cues for hand placement. Pt ambulated to day room with RW and CGA. Pt noted to require cues for increased heel strike, decreased weight shifting to L noted and forward flexed posture. In dayroom session focused on dynamic standing balance and L NMR via forced use. Pt participated in ball taps in standing with +2 present for safety, posture, correction, weight shifting, and maintaining TKE on L. Pt requiring mod multimodal cues as pt noted to rotate hips to compensate for decreased weight shift to L. Pt with limited carryover to maintain erect posture as well. Pt also required cues for wrist extension to allow pt to tap ball with palm of hand to facilitate maintaining LUE attention. Due to pt's compensations 2in step placed with pt placing RLE on step to increase LLE forced use. Pt requiring mod multimodal cues for postural corrections and +2 providing biofeedback at L knee to maintain quad activation. Pt then ambulated to [employer] with RW and participate din Sit to stand x 5 with mirror feedback with pt attempting to maintain equal weight on plates. Pt also performed small  range standing march at 50 cm/sec ~x20. Once completed pt transferred to w/c for energy conservation and transported back to room. Pt remained in w/c at end of session with belt alarm on, call bell within reach and needs met.  ?   ? ?Therapy Documentation ?Precautions:  ?Precautions ?Precautions: Fall ?Precaution Comments: L hemi ?Restrictions ?Weight Bearing Restrictions: No ?General: ?  ?Vital Signs: ?  ? ? ? ?Therapy/Group: Individual Therapy ? ?Mark Herrera ?Mark Herrera, PTA ? ?09/12/2021, 10:31 AM  ?

## 2021-09-12 NOTE — Progress Notes (Signed)
Occupational Therapy Session Note ? ?Patient Details  ?Name: Mark Herrera ?MRN: [id number] ?Date of Birth: 1961-01-16 ? ?Today's Date: 09/12/2021 ?OT Individual Time: 8546-2703 ?OT Individual Time Calculation (min): 75 min  ? ? ?Short Term Goals: ?Week 1:  OT Short Term Goal 1 (Week 1): Pt will complete 1/3 toileting steps with no more than min cues ?OT Short Term Goal 2 (Week 1): Pt will complete sit > stand in prep for ADL with min A ?OT Short Term Goal 3 (Week 1): Pt will donn UB shirt with min A with min cues needed for hemi-technique ? ?Skilled Therapeutic Interventions/Progress Updates:  ?  OT intervention with focus on functional amb with RW, LUE strengthening and FMC, bathing at shower level, and dressing with sit<>stand from std chair. Amb with RW into bathroom and transferred to shower seat. Pt doffed clothing with CGA when standing. Bathing with CGA when standing. Pt amb with RW to std chair in room. Dressing with CGA when standing. Pt able to tie shoe laces without assistance. All amb with CGA. Pt transitioned to gym and issued red theraputty. Pt completed theraputty activities with focus on finger flexion/extension and strengtheining. Pt retuned to room and reamined in w/c with all needs within reach. Belt alarm activated. S/o present.  ? ?Therapy Documentation ?Precautions:  ?Precautions ?Precautions: Fall ?Precaution Comments: L hemi ?Restrictions ?Weight Bearing Restrictions: No ?  ?Pain: ? Pt denies pain this morning ? ? ?Therapy/Group: Individual Therapy ? ?Rich Brave ?09/12/2021, 10:16 AM ?

## 2021-09-12 NOTE — Progress Notes (Signed)
Physical Therapy Session Note ? ?Patient Details  ?Name: Mark Herrera ?MRN: [id number] ?Date of Birth: [birth year] ? ?Today's Date: 09/12/2021 ?PT Individual Time: 1443-1540 ?PT Individual Time Calculation (min): 45 min  ? ?Short Term Goals: ?Week 1:  PT Short Term Goal 1 (Week 1): Pt will complete bed<>chair transfers with CGA and LRAD ?PT Short Term Goal 2 (Week 1): Pt will ambulate 139f with CGA and LRAD ?PT Short Term Goal 3 (Week 1): Pt will complete outcome measure to assess falls risk ? ?Skilled Therapeutic Interventions/Progress Updates: Pt presented in bed sleeping but easily aroused by girlfriend and agreeable to therapy. Pt denies pain and motivated to participate. Pt performed bed mobility with supervision with use of bed features. Performed ambulatory transfer to w/c with RW and CGA. [facility]  and ambulation on uneven surfaces. Pt ambulated throughout courtyard with RW and CGA with intermittent cues for increasing heel strike and adequate L foot clearance. Pt did note decreased L foot clearance with fatigue but was able to correct with cues. Pt with seated rest break after ~3062f Pt then ambulated back to  entrance >30014fn same manner as prior with  verbal cues for RW management when crossing over threshold as well as door mat at entrance which pt's RW did catch on but was able to correct with cues. Pt ambulated in [facility] as well in same manner as prior. Pt transported back to 4W nsg station and pt ambulated in controlled environment with CGA and improved step length and R foot clearance. Once back in room pt transferred to bed with supervision and returned to supine. Pt left in bed at end of session with bed alarm on, call bell within reach and needs met.  ?   ? ?Therapy Documentation ?Precautions:  ?Precautions ?Precautions: Fall ?Precaution Comments: L hemi ?Restrictions ?Weight Bearing Restrictions: No ?General: ?  ?Vital Signs: ?Therapy  Vitals ?Temp: 98.7 ?F (37.1 ?C) ?Pulse Rate: 65 ?Resp: 17 ?BP: 135/61 ?Patient Position (if appropriate): Sitting ?Oxygen Therapy ?SpO2: 100 % ?O2 Device: Room Air ?Pain: ?Pain Assessment ?Pain Score: 4  ? ? ? ?Therapy/Group: Individual Therapy ? ?Izzah Pasqua ?09/12/2021, 3:40 PM  ?

## 2021-09-13 MED ORDER — BENZOCAINE 10 % MT GEL
Freq: Four times a day (QID) | OROMUCOSAL | Status: DC | PRN
  Filled 2021-09-13: qty 9

## 2021-09-13 NOTE — Progress Notes (Signed)
Occupational Therapy Note ? ?Patient Details  ?Name: Mark Herrera ?MRN: [id number] ?Date of Birth: Jul 07, 1960 ? ?Today's Date: 09/13/2021 ?OT Missed Time: 75 Minutes ?Missed Time Reason: Pain ? ?Pt missed 75 mins skilled OT services. Pt sitting EOB with hot pack on Rt side of face, c/o "bad toothache." RN present to admin meds. OTA checked back later and pt still in significant pain. Pt requested to "reschedule." Will check back as schedule allows.  ? ?Rich Brave ?09/13/2021, 9:21 AM ?

## 2021-09-13 NOTE — Progress Notes (Signed)
Physical Therapy Session Note ? ?Patient Details  ?Name: Mark Herrera ?MRN: [id number] ?Date of Birth: 26-Sep-1960 ? ?Today's Date: 09/13/2021 ?PT Individual Time: 7408-1448 ?PT Individual Time Calculation (min): 58 min  ? ?Short Term Goals: ?Week 1:  PT Short Term Goal 1 (Week 1): Pt will complete bed<>chair transfers with CGA and LRAD ?PT Short Term Goal 2 (Week 1): Pt will ambulate 133ft with CGA and LRAD ?PT Short Term Goal 3 (Week 1): Pt will complete outcome measure to assess falls risk ? ?Skilled Therapeutic Interventions/Progress Updates:  ?Pt received asleep lying in bed, girlfriend also present. Pt was easily woken and agreeable to therapy. He reports pain (not rated) d/t toothache that began in the middle of the night; has been premedicated and states that pain has significantly improved since earlier this AM. Supervision for bed mobility (lying > sitting). Pt transported in w/c to therapy gym for time management.  ? ?- Stairs navigation initiated with minA x4 steps -- trial 1: 2HR, trial 2: 1HR (only attempted with RUE supported). Pt demonstrated good understanding of safe foot placement and which LE should lead with ascension/descension with min verbal cuing. Requires slightly increased time with descent to ensure LLE is fully on step which pt states he is able to feel adequately.  ? ?Pt transported in w/c outside on 1st floor for time management. ?- Gait training initiated with RW CGA for 2 trials: x275ft, x250 ft - with seated rest break between trials. Pt demonstrates improved awareness of L foot placement but continues to show decreased L foot clearance (dragging toe), requires increased time, verbal cuing for upright posture, and stepping with wider BOS. Within both trials, pt ambulated across uneven surfaces and navigated multiple turns without LOB. Retrogait also performed x36ft with RW minA to assess pt coordination/balance. ? ?- Dynamic standing balance performed in tandem stance (bilat) with RW  CGA while hitting a ball to PTA; RW present for stability in case of LOB but pt performed most of activity with no UE support and minimal instances of contact with walker to regain stabilization. More difficulty with RLE leading d/t increased WB and reliance on LLE stability. ? ?Pt transported back to room in w/c for time management. Pt left in w/c with safety belt active and all needs in reach. ? ?Therapy Documentation ?Precautions:  ?Precautions ?Precautions: Fall ?Precaution Comments: L hemi ?Restrictions ?Weight Bearing Restrictions: No ? ? ?Therapy/Group: Individual Therapy ? ?Charmian Muff ?09/13/2021, 12:37 PM  ?

## 2021-09-13 NOTE — Progress Notes (Signed)
Physical Therapy Session Note ? ?Patient Details  ?Name: Mark Herrera ?MRN: [id number] ?Date of Birth: Mar 17, 1961 ? ?Today's Date: 09/13/2021 ?PT Individual Time: 1303-1400 ?PT Individual Time Calculation (min): 57 min  ? ?Short Term Goals: ?Week 1:  PT Short Term Goal 1 (Week 1): Pt will complete bed<>chair transfers with CGA and LRAD ?PT Short Term Goal 2 (Week 1): Pt will ambulate 173ft with CGA and LRAD ?PT Short Term Goal 3 (Week 1): Pt will complete outcome measure to assess falls risk ? ?Skilled Therapeutic Interventions/Progress Updates: Pt presented in w/c agreeable to therapy. Pt states tooth pain has improved since am session. Pt ambulated to day room with RW and CGA with PTA requiring intermittent cues for improved heel strike. Session focused on gait training, balance, and NMR via forced use. Pt participated in ambulation ~169ft without AD with minA nearing CGA. Pt with increased L hip instability in stance phase and requiring increased cues for L foot clearance. Pt also participated in ambulation stepping over cones with LLE. Pt requiring intermittent minA for balance correction however was able to improve to consistently clear cones. Pt indicating becoming more aware when ambulating without AD that he has more deficits than prior to CVA. PTA educated pt that he still has functional strength however requires more attention to task with pt verbalizing understanding. Pt participated in Sit to stand holding 1.5Kg weighted ball bringing ball overhead during transition to stand x 10. Pt also performed reaching around body to pass ball clockwise/counterclockwise x 15 each direction. Participated in use of agility ladder side stepping as well as performing in/outs. Pt requiring increased time to complete task and required minA performing in/outs due to instability. Pt transported back to room at end of session and requesting to use bathroom. Performed stand pivot to toilet with CGA and was able to perform  clothing management with CGA. Pt left at toilet at end of session with Yehuda Mao NT and [facility] LPN notified of pt's disposition and pt verbalizing understanding to pull cord for assistance once completed.  ?   ? ?Therapy Documentation ?Precautions:  ?Precautions ?Precautions: Fall ?Precaution Comments: L hemi ?Restrictions ?Weight Bearing Restrictions: No ?General: ?  ?Vital Signs: ?Therapy Vitals ?Temp: 97.9 ?F (36.6 ?C) ?Temp Source: Oral ?Pulse Rate: 63 ?Resp: 17 ?BP: 127/84 ?Patient Position (if appropriate): Lying ?Oxygen Therapy ?SpO2: 100 % ?O2 Device: Room Air ? ?Therapy/Group: Individual Therapy ? ?Amere Bricco ?Daylyn Christine, PTA ? ?09/13/2021, 3:33 PM  ?

## 2021-09-13 NOTE — Progress Notes (Signed)
?                                                       PROGRESS NOTE ? ? ?Subjective/Complaints: ?Had a bad toothache this morning. Missed 75 minutes OT this morning due to toothache. Discussed ordering oragel and he is appreciative.  ?He did great with PT this morning! ? ?ROS: ? ?Pt denies SOB, abd pain, CP, N/V/C/D, and vision changes, +weakness, +toothache ? ? ?Objective: ?  ?No results found. ?No results for input(s): WBC, HGB, HCT, PLT in the last 72 hours. ? ?No results for input(s): NA, K, CL, CO2, GLUCOSE, BUN, CREATININE, CALCIUM in the last 72 hours. ? ? ?Intake/Output Summary (Last 24 hours) at 09/13/2021 1137 ?Last data filed at 09/13/2021 0746 ?Gross per 24 hour  ?Intake 960 ml  ?Output 250 ml  ?Net 710 ml  ?  ? ?  ? ?Physical Exam: ?Vital Signs ?Blood pressure 133/69, pulse 66, temperature 98.2 ?F (36.8 ?C), temperature source Oral, resp. rate 18, height 5\' 9"  (1.753 m), weight 72.3 kg, SpO2 100 %. ?Gen: no distress, normal appearing ?HEENT: oral mucosa pink and moist, NCAT ?Cardio: Reg rate ?Chest: normal effort, normal rate of breathing ?Abd: soft, non-distended ?Ext: no edema ?Psych: pleasant, normal affect ?Skin: intact ?Neuro/Musculoskeletal: Left homonymous hemianopsia. Sensation intact. LUE drift. Impaired LUE coordination. 4/5 strength throughout left side. Right sided strength intact. Seated in wheelchair ?  ? ? ?Assessment/Plan: ?1. Functional deficits which require 3+ hours per day of interdisciplinary therapy in a comprehensive inpatient rehab setting. ?Physiatrist is providing close team supervision and 24 hour management of active medical problems listed below. ?Physiatrist and rehab team continue to assess barriers to discharge/monitor patient progress toward functional and medical goals ? ?Care Tool: ? ?Bathing ?   ?Body parts bathed by patient: Right arm, Left arm, Chest, Abdomen, Front perineal area, Buttocks, Right upper leg, Left upper leg, Right lower leg, Left lower leg, Face  ?  Body parts bathed by helper: Right lower leg, Left lower leg, Buttocks ?  ?  ?Bathing assist Assist Level: Contact Guard/Touching assist ?  ?  ?Upper Body Dressing/Undressing ?Upper body dressing   ?What is the patient wearing?: Pull over shirt ?   ?Upper body assist Assist Level: Supervision/Verbal cueing ?   ?Lower Body Dressing/Undressing ?Lower body dressing ? ? ?   ?What is the patient wearing?: Pants ? ?  ? ?Lower body assist Assist for lower body dressing: Contact Guard/Touching assist ?   ? ?Toileting ?Toileting    ?Toileting assist Assist for toileting: Contact Guard/Touching assist ?  ?  ?Transfers ?Chair/bed transfer ? ?Transfers assist ?   ? ?Chair/bed transfer assist level: Contact Guard/Touching assist ?  ?  ?Locomotion ?Ambulation ? ? ?Ambulation assist ? ?   ? ?Assist level: Contact Guard/Touching assist ?Assistive device: Walker-rolling ?Max distance: 156ft  ? ?Walk 10 feet activity ? ? ?Assist ?   ? ?Assist level: Contact Guard/Touching assist ?Assistive device: Walker-rolling  ? ?Walk 50 feet activity ? ? ?Assist   ? ?Assist level: Contact Guard/Touching assist ?Assistive device: Walker-rolling  ? ? ?Walk 150 feet activity ? ? ?Assist Walk 150 feet activity did not occur: Safety/medical concerns ? ?  ?  ?  ? ?Walk 10 feet on uneven surface  ?activity ? ? ?Assist Walk 10 feet  on uneven surfaces activity did not occur: Safety/medical concerns ? ? ?  ?   ? ?Wheelchair ? ? ? ? ?Assist Is the patient using a wheelchair?: No ?  ?  ? ?  ?   ? ? ?Wheelchair 50 feet with 2 turns activity ? ? ? ?Assist ? ?  ?  ? ? ?   ? ?Wheelchair 150 feet activity  ? ? ? ?Assist ?   ? ? ?   ? ?Blood pressure 133/69, pulse 66, temperature 98.2 ?F (36.8 ?C), temperature source Oral, resp. rate 18, height 5\' 9"  (1.753 m), weight 72.3 kg, SpO2 100 %. ? ?Medical Problem List and Plan: ?1. Functional deficits secondary to right MCA infarction ?            -patient may shower ?            -ELOS/Goals: 10-14 days S ? Continue  CIR- PT, OT and SLP ?2.  High grade intracranial stenosis: continue aspirin and plavix (will need total 90 days) and DVT prophylaxis with SCDs.  ?3. HLD: LDL is 105, continue atorvastatin 40mg  ?4. Situational depression: provide encouragement regarding improvements in sensation and strength already.  ? Continue Celexa 20 mg daily, started 4/11. ?5. Neuropsych: This patient is capable of making decisions on his own behalf. ?6. Impulsivity: SLP ordered ?7. Active smoker: continue smoking cessation counseling ? continue Nicotine patch 7 mcg daily, patient states it is helping ?8. Active THC use: continue providing cessation counseling ?9. Active cocaine use: continue providing cessation counseling ?10. History of prior R MCA stroke: provide stroke prevention counseling ?11. Hyperglycemia: HgbA1c is 5.3, educate regarding low added sugar in diet.  ?12. Hypoalbuminemia: educated regarding low sugar/high protein diet.  ?13. Urinary frequency/urgency/accidents-UC reviewed and shows insignificant growth.  ?14. Toothache: oragel ordered.  ? ?LOS: ?4 days ?A FACE TO FACE EVALUATION WAS PERFORMED ? ? P Nina Hoar ?09/13/2021, 11:37 AM  ? ? ? ?

## 2021-09-14 NOTE — Progress Notes (Signed)
Physical Therapy Session Note ? ?Patient Details  ?Name: Mark Herrera ?MRN: [id number] ?Date of Birth: 07-25-1960 ? ?Today's Date: 09/14/2021 ?PT Individual Time: 6629-4765 ?PT Individual Time Calculation (min): 58 min  ? ?Short Term Goals: ?Week 1:  PT Short Term Goal 1 (Week 1): Pt will complete bed<>chair transfers with CGA and LRAD ?PT Short Term Goal 2 (Week 1): Pt will ambulate 151ft with CGA and LRAD ?PT Short Term Goal 3 (Week 1): Pt will complete outcome measure to assess falls risk ? ?Skilled Therapeutic Interventions/Progress Updates: Pt handed off by OT in dayroom.  Pt eager for therapy.  Pt performed game of horseshoes standing on [employer] cushion using B UEs reaching across midline w/o using Ues for support.  Pt required CGA and verbal cues for safe performance.  Pt performed horseshoes standing w/ forefeet on wedge w/ same A but increased LOB backwards initially.  Pt amb w/o AD x 60', 100', 120' and 150' into room holding glass of water in 1 hand and then other.  Pt required  CGA and verbal cues for maintaining glass w/ LUE, as well as attention to surroundings.  Pt amb into BR and able to manage clothing supervision.  Pt to call nursing when finished and nursing aware. ?   ? ?Therapy Documentation ?Precautions:  ?Precautions ?Precautions: Fall ?Precaution Comments: L hemi ?Restrictions ?Weight Bearing Restrictions: No ?General: ?  ?Vital Signs: ? ?Pain:no c/o ?  ? ? ?Therapy/Group: Individual Therapy ? ?[name] ?09/14/2021, 10:19 AM  ?

## 2021-09-14 NOTE — Progress Notes (Signed)
Occupational Therapy Session Note ? ?Patient Details  ?Name: Mark Herrera ?MRN: [id number] ?Date of Birth: [birth year] ? ?Today's Date: 09/14/2021 ?OT Individual Time: 9242-6834 ?OT Individual Time Calculation (min): 60 min  ? ? ?Short Term Goals: ?Week 1:  OT Short Term Goal 1 (Week 1): Pt will complete 1/3 toileting steps with no more than min cues ?OT Short Term Goal 2 (Week 1): Pt will complete sit > stand in prep for ADL with min A ?OT Short Term Goal 3 (Week 1): Pt will donn UB shirt with min A with min cues needed for hemi-technique ? ? ?Skilled Therapeutic Interventions/Progress Updates:  ?  Pt semi reclined in bed, very pleasant and motivated to make progress.  Pt completed supine to sit with independence.  Sit to stand and ambulated to doorway with close supervision.  Pt turned right into hallway and ambulated without AD ~100 feet to large gym with CGA and intermittent Vcs to attend to LLE step length and foot clearance during swing phase.  Pt slightly distracted internally and externally requiring the cueing to redirect.  Pt sat in arm chair and participated in [facility] assessments and table top activities.   ? ?Box and Blocks: Right 46 blocks  Left 31 blocks ? ?9 hole peg test: Right 37.5 seconds  Left 69.7 seconds ? ?Left hand fine motor control impairment apparent from results of these assessments.  Instructed pt through finger<>palm in hand translation using small smooth rocks, shuffling/dealing cards, lacing paperclips.  Provided [facility] HEP handout.  Pt ambulated back to room this time with education prior to mobility on importance of reducing distraction and with increased attention to LLE during ambulation.  Pt required less Vcs intermittently, more like min-mod cueing to attend, and appeared with increased steadiness requiring close supervision with intermittent CGA.  Pt ended session sitting EOB with lunch tray setup and call bell in reach, bed alarm on. ? ?Therapy Documentation ?Precautions:   ?Precautions ?Precautions: Fall ?Precaution Comments: L hemi ?Restrictions ?Weight Bearing Restrictions: No ? ? ? ?Therapy/Group: Individual Therapy ? ?Dian Situ Brion Hedges ?09/14/2021, 1:52 PM ?

## 2021-09-14 NOTE — Progress Notes (Signed)
Occupational Therapy Session Note ? ?Patient Details  ?Name: Mark Herrera ?MRN: [id number] ?Date of Birth: 09-22-1960 ? ?Today's Date: 09/14/2021 ?OT Individual Time: 5329-9242 ?OT Individual Time Calculation (min): 42 min  (12 make up time) ? ? ?Short Term Goals: ?Week 1:  OT Short Term Goal 1 (Week 1): Pt will complete 1/3 toileting steps with no more than min cues ?OT Short Term Goal 2 (Week 1): Pt will complete sit > stand in prep for ADL with min A ?OT Short Term Goal 3 (Week 1): Pt will donn UB shirt with min A with min cues needed for hemi-technique ? ?Skilled Therapeutic Interventions/Progress Updates:  ?   ?Pt received in bed with no pain reported  ?ADL: ?Pt completes toileting at end of session with education on use of RW over toilet for standing void.  ? ?Therapeutic activity ?Pt taken to outside courtyard to work on [employer] mobility, community reentry, standing balance, stair negotiation, curb negotiation and walking over grass. Pt completes all mobility with CGA. Emphasis on hemi engagement doing 1/2 flight of stairs with hemi LE going up for strengthening with cuing for weight shift forward. Pt understanding this was just for exercise and should negotiate with strong LE up normally. Pt walks up curb step with walker and around grassy area with CGA and no LOB. Cuing for increased pace when on level ground ? ?Pt left at end of session in bed with exit alarm on, call light in reach and all needs met ? ? ?Therapy Documentation ?Precautions:  ?Precautions ?Precautions: Fall ?Precaution Comments: L hemi ?Restrictions ?Weight Bearing Restrictions: No ?General: ?  ?Vital Signs: ? ? ? ?Therapy/Group: individual ? ?Lowella Dell Eula Jaster ?09/14/2021, 9:18 AM ?

## 2021-09-14 NOTE — Progress Notes (Signed)
Occupational Therapy Session Note ? ?Patient Details  ?Name: Mark Herrera ?MRN: [id number] ?Date of Birth: 02/14/1961 ? ?Today's Date: 09/14/2021 ?OT Individual Time: 7062-3762 ?OT Individual Time Calculation (min): 60 min  ? ? ?Short Term Goals: ?Week 1:  OT Short Term Goal 1 (Week 1): Pt will complete 1/3 toileting steps with no more than min cues ?OT Short Term Goal 2 (Week 1): Pt will complete sit > stand in prep for ADL with min A ?OT Short Term Goal 3 (Week 1): Pt will donn UB shirt with min A with min cues needed for hemi-technique ? ?Skilled Therapeutic Interventions/Progress Updates:  ?  OT intervention with focus on BADL retraining, functional amb with RW and witout AD, standing balance, LUE FMC, activity tolerance, and safety awareness. Pt amb with RW to bathroom and completed shower with CGA when standing. Pt returned to room with RW and sat EOB for dressing tasks. Pt completed dressing tasks with CGA for standing. Pt amb with RW to day room with CGA. [facility] tasks with red theraputty and small beads. Pt removed and replaced beads with LUE/hand. Amb in day room without AD with CGA and min verbal cues for safety. Pt presented with basketball and dribbled with RUE and LUE. Pt amb with min A for balance while dribbling basketball with RUE and LUE. Pt pleased with progress. Hand off to PT. ? ?Therapy Documentation ?Precautions:  ?Precautions ?Precautions: Fall ?Precaution Comments: L hemi ?Restrictions ?Weight Bearing Restrictions: No ? ?Pain: ? Pt reports his tooth still hurts but is better; pt denies any other pain ? ? ?Therapy/Group: Individual Therapy ? ?Rich Brave ?09/14/2021, 9:28 AM ?

## 2021-09-14 NOTE — Progress Notes (Signed)
?                                                       PROGRESS NOTE ? ? ?Subjective/Complaints: ?He would like to be able to go to bathroom independently- discussed with team and they do not feel he is ready for this yet. ?Oragel helped with tooth pain ? ?ROS: ? ?Pt denies SOB, abd pain, CP, N/V/C/D, and vision changes, +weakness, +toothache- improved ? ? ?Objective: ?  ?No results found. ?No results for input(s): WBC, HGB, HCT, PLT in the last 72 hours. ? ?No results for input(s): NA, K, CL, CO2, GLUCOSE, BUN, CREATININE, CALCIUM in the last 72 hours. ? ? ?Intake/Output Summary (Last 24 hours) at 09/14/2021 1553 ?Last data filed at 09/14/2021 1200 ?Gross per 24 hour  ?Intake 1248 ml  ?Output 700 ml  ?Net 548 ml  ?  ? ?  ? ?Physical Exam: ?Vital Signs ?Blood pressure 121/74, pulse (!) 57, temperature 98.1 ?F (36.7 ?C), temperature source Oral, resp. rate 17, height 5\' 9"  (1.753 m), weight 72.3 kg, SpO2 100 %. ?Gen: no distress, normal appearing ?HEENT: oral mucosa pink and moist, NCAT ?Cardio: Bradycardic ?Chest: normal effort, normal rate of breathing ?Abd: soft, non-distended ?Ext: no edema ?Psych: pleasant, normal affect ?Skin: intact ?Neuro/Musculoskeletal: Left homonymous hemianopsia. Sensation intact. LUE drift. Impaired LUE coordination. 4/5 strength throughout left side. Right sided strength intact. Seated in wheelchair ?  ? ? ?Assessment/Plan: ?1. Functional deficits which require 3+ hours per day of interdisciplinary therapy in a comprehensive inpatient rehab setting. ?Physiatrist is providing close team supervision and 24 hour management of active medical problems listed below. ?Physiatrist and rehab team continue to assess barriers to discharge/monitor patient progress toward functional and medical goals ? ?Care Tool: ? ?Bathing ?   ?Body parts bathed by patient: Right arm, Left arm, Chest, Abdomen, Front perineal area, Buttocks, Right upper leg, Left upper leg, Right lower leg, Left lower leg, Face  ?  Body parts bathed by helper: Right lower leg, Left lower leg, Buttocks ?  ?  ?Bathing assist Assist Level: Contact Guard/Touching assist ?  ?  ?Upper Body Dressing/Undressing ?Upper body dressing   ?What is the patient wearing?: Pull over shirt ?   ?Upper body assist Assist Level: Supervision/Verbal cueing ?   ?Lower Body Dressing/Undressing ?Lower body dressing ? ? ?   ?What is the patient wearing?: Pants, Incontinence brief ? ?  ? ?Lower body assist Assist for lower body dressing: Contact Guard/Touching assist ?   ? ?Toileting ?Toileting    ?Toileting assist Assist for toileting: Contact Guard/Touching assist ?  ?  ?Transfers ?Chair/bed transfer ? ?Transfers assist ?   ? ?Chair/bed transfer assist level: Contact Guard/Touching assist ?  ?  ?Locomotion ?Ambulation ? ? ?Ambulation assist ? ?   ? ?Assist level: Contact Guard/Touching assist ?Assistive device: No Device ?Max distance: 150  ? ?Walk 10 feet activity ? ? ?Assist ?   ? ?Assist level: Contact Guard/Touching assist ?Assistive device: No Device  ? ?Walk 50 feet activity ? ? ?Assist   ? ?Assist level: Contact Guard/Touching assist ?Assistive device: No Device  ? ? ?Walk 150 feet activity ? ? ?Assist Walk 150 feet activity did not occur: Safety/medical concerns ? ?Assist level: Contact Guard/Touching assist ?Assistive device: No Device ?  ? ?Walk 10 feet  on uneven surface  ?activity ? ? ?Assist Walk 10 feet on uneven surfaces activity did not occur: Safety/medical concerns ? ? ?Assist level: Contact Guard/Touching assist ?Assistive device: Walker-rolling  ? ?Wheelchair ? ? ? ? ?Assist Is the patient using a wheelchair?: No ?  ?  ? ?  ?   ? ? ?Wheelchair 50 feet with 2 turns activity ? ? ? ?Assist ? ?  ?  ? ? ?   ? ?Wheelchair 150 feet activity  ? ? ? ?Assist ?   ? ? ?   ? ?Blood pressure 121/74, pulse (!) 57, temperature 98.1 ?F (36.7 ?C), temperature source Oral, resp. rate 17, height 5\' 9"  (1.753 m), weight 72.3 kg, SpO2 100 %. ? ?Medical Problem List and  Plan: ?1. Functional deficits secondary to right MCA infarction ?            -patient may shower ?            -ELOS/Goals: 10-14 days S ? Continue CIR- PT, OT and SLP ? Discussed the type of work he does, eventual goal to return to work ?2.  High grade intracranial stenosis: continue aspirin and plavix (will need total 90 days) and DVT prophylaxis with SCDs.  ?3. HLD: LDL is 105, continue atorvastatin 40mg  ?4. Situational depression: provide encouragement regarding improvements in sensation and strength already.  ? Continue Celexa 20 mg daily, started 4/11. ?5. Neuropsych: This patient is capable of making decisions on his own behalf. ?6. Impulsivity: SLP ordered ?7. Active smoker: continue smoking cessation counseling ? continue Nicotine patch 7 mcg daily, patient states it is helping ?8. Active THC use: continue providing cessation counseling ?9. Active cocaine use: continue providing cessation counseling ?10. History of prior R MCA stroke: provide stroke prevention counseling ?11. Hyperglycemia: HgbA1c is 5.3, educate regarding low added sugar in diet.  ?12. Hypoalbuminemia: educated regarding low sugar/high protein diet.  ?13. Urinary frequency/urgency/accidents-UC reviewed and shows insignificant growth.  ?14. Toothache: oragel ordered and helpful, continue PRN.  ?15. Bradycardic: continue to monitor HR TID ? ?LOS: ?5 days ?A FACE TO FACE EVALUATION WAS PERFORMED ? ? P Lorretta Kerce ?09/14/2021, 3:53 PM  ? ? ? ?

## 2021-09-15 DIAGNOSIS — E785 Hyperlipidemia, unspecified: Secondary | ICD-10-CM

## 2021-09-15 DIAGNOSIS — F1910 Other psychoactive substance abuse, uncomplicated: Secondary | ICD-10-CM

## 2021-09-15 DIAGNOSIS — R001 Bradycardia, unspecified: Secondary | ICD-10-CM

## 2021-09-15 DIAGNOSIS — I63511 Cerebral infarction due to unspecified occlusion or stenosis of right middle cerebral artery: Secondary | ICD-10-CM

## 2021-09-15 NOTE — Progress Notes (Signed)
?                                                       PROGRESS NOTE ? ? ?Subjective/Complaints: ?Patient seen laying in bed this AM. He states he slept well overnight.  He would like to ambulate in the room.  Discussed safety. Encouraged to discuss with therapies today.  ? ?ROS: Denies CP, SOB, N/V/D ? ?Objective: ?  ?No results found. ?No results for input(s): WBC, HGB, HCT, PLT in the last 72 hours. ? ?No results for input(s): NA, K, CL, CO2, GLUCOSE, BUN, CREATININE, CALCIUM in the last 72 hours. ? ? ?Intake/Output Summary (Last 24 hours) at 09/15/2021 0944 ?Last data filed at 09/15/2021 9449 ?Gross per 24 hour  ?Intake 531 ml  ?Output 1400 ml  ?Net -869 ml  ? ?  ? ?  ? ?Physical Exam: ?Vital Signs ?Blood pressure 127/69, pulse (!) 54, temperature 98.1 ?F (36.7 ?C), temperature source Oral, resp. rate 14, height 5\' 9"  (1.753 m), weight 72.3 kg, SpO2 100 %. ?Gen: NAD. Normal appearing ?HEENT: oral mucosa pink and moist, NCAT ?Cardio: +Bradycardic ?Chest: normal effort, normal rate of breathing ?Abd: soft, non-distended ?Psych: pleasant, normal affect ?Skin: intact ?Neuro/Musculoskeletal: Left homonymous hemianopsia.  ?Motor: 4/5 strength throughout left side. Mild dysmetria ? ?Assessment/Plan: ?1. Functional deficits which require 3+ hours per day of interdisciplinary therapy in a comprehensive inpatient rehab setting. ?Physiatrist is providing close team supervision and 24 hour management of active medical problems listed below. ?Physiatrist and rehab team continue to assess barriers to discharge/monitor patient progress toward functional and medical goals ? ?Care Tool: ? ?Bathing ?   ?Body parts bathed by patient: Right arm, Left arm, Chest, Abdomen, Front perineal area, Buttocks, Right upper leg, Left upper leg, Right lower leg, Left lower leg, Face  ? Body parts bathed by helper: Right lower leg, Left lower leg, Buttocks ?  ?  ?Bathing assist Assist Level: Contact Guard/Touching assist ?  ?  ?Upper Body  Dressing/Undressing ?Upper body dressing   ?What is the patient wearing?: Pull over shirt ?   ?Upper body assist Assist Level: Supervision/Verbal cueing ?   ?Lower Body Dressing/Undressing ?Lower body dressing ? ? ?   ?What is the patient wearing?: Pants, Incontinence brief ? ?  ? ?Lower body assist Assist for lower body dressing: Contact Guard/Touching assist ?   ? ?Toileting ?Toileting    ?Toileting assist Assist for toileting: Contact Guard/Touching assist ?  ?  ?Transfers ?Chair/bed transfer ? ?Transfers assist ?   ? ?Chair/bed transfer assist level: Contact Guard/Touching assist ?  ?  ?Locomotion ?Ambulation ? ? ?Ambulation assist ? ?   ? ?Assist level: Contact Guard/Touching assist ?Assistive device: No Device ?Max distance: 150  ? ?Walk 10 feet activity ? ? ?Assist ?   ? ?Assist level: Contact Guard/Touching assist ?Assistive device: No Device  ? ?Walk 50 feet activity ? ? ?Assist   ? ?Assist level: Contact Guard/Touching assist ?Assistive device: No Device  ? ? ?Walk 150 feet activity ? ? ?Assist Walk 150 feet activity did not occur: Safety/medical concerns ? ?Assist level: Contact Guard/Touching assist ?Assistive device: No Device ?  ? ?Walk 10 feet on uneven surface  ?activity ? ? ?Assist Walk 10 feet on uneven surfaces activity did not occur: Safety/medical concerns ? ? ?Assist level: Contact  Guard/Touching assist ?Assistive device: Walker-rolling  ? ?Wheelchair ? ? ? ? ?Assist Is the patient using a wheelchair?: No ?  ?  ? ?  ?   ? ? ?Wheelchair 50 feet with 2 turns activity ? ? ? ?Assist ? ?  ?  ? ? ?   ? ?Wheelchair 150 feet activity  ? ? ? ?Assist ?   ? ? ?   ? ?Blood pressure 127/69, pulse (!) 54, temperature 98.1 ?F (36.7 ?C), temperature source Oral, resp. rate 14, height 5\' 9"  (1.753 m), weight 72.3 kg, SpO2 100 %. ? ?Medical Problem List and Plan: ?1. Functional deficits secondary to right MCA infarction ?            Cont CIR ?2. High grade intracranial stenosis: continue aspirin and plavix (will  need total 90 days) and DVT prophylaxis with SCDs.  ?3. HLD: LDL is 105, continue atorvastatin 40mg  ?4. Situational depression: provide encouragement regarding improvements in sensation and strength already.  ? Continue Celexa 20 mg daily, started 4/11. ?5. Neuropsych: This patient is capable of making decisions on his own behalf. ?6. Impulsivity: SLP ordered ?7. Active smoker: continue smoking cessation counseling ? continue Nicotine patch 7 mcg daily, patient states it is helping ?8. Active THC use: continue providing cessation counseling ?9. Active cocaine use: continue providing cessation counseling ?10. History of prior R MCA stroke: provide stroke prevention counseling ?11. Hyperglycemia: HgbA1c is 5.3, educate regarding low added sugar in diet.  ?12. Hypoalbuminemia: educated regarding low sugar/high protein diet.  ?13. Urinary frequency/urgency/accidents-UC reviewed and shows insignificant growth.  ?14. Toothache: oragel ordered and helpful, continue PRN.  ?15. Bradycardic: continue to monitor HR TID ? Stable on 4/15 ? ?LOS: ?6 days ?A FACE TO FACE EVALUATION WAS PERFORMED ? ?Eathel Pajak 6/11 ?09/15/2021, 9:44 AM  ? ? ? ?

## 2021-09-16 NOTE — Progress Notes (Signed)
Occupational Therapy Session Note ? ?Patient Details  ?Name: Mark Herrera ?MRN: [id number] ?Date of Birth: August 22, 1960 ? ?Today's Date: 09/16/2021 ?OT Individual Time: 0700-0800 ?OT Individual Time Calculation (min): 60 min  ? ? ?Short Term Goals: ?Week 1:  OT Short Term Goal 1 (Week 1): Pt will complete 1/3 toileting steps with no more than min cues ?OT Short Term Goal 2 (Week 1): Pt will complete sit > stand in prep for ADL with min A ?OT Short Term Goal 3 (Week 1): Pt will donn UB shirt with min A with min cues needed for hemi-technique ? ?Skilled Therapeutic Interventions/Progress Updates:  ?  Pt sleeping in bed upon arrival but easily aroused. Pt asked to eat breakfast before taking a shower. Self feeding with focus on LUE use at diminished level. Pt able to open all containers without assistance. Pt amb with RW to bathroom (CGA) and completed shower with CGA for standing balance. Pt returned to room and completed dressing with sit<>stand for LB dressing (CGA). Pt amb without AD to gather oral care supplies before standing at sink to complete grooming. Pt amb wihtout AD in hallway to day room and return with CGA and min verbal cues for swing through of LLE. No LOB noted. No safety issues. Pt verbalized understanding that he will need to use AD for amb when he discharges. Pt returned to EOB and reamined seated EOB with bed alarm activated. All needs within reach.  ? ?Therapy Documentation ?Precautions:  ?Precautions ?Precautions: Fall ?Precaution Comments: L hemi ?Restrictions ?Weight Bearing Restrictions: No ?Pain: ? Pt denies pain this morning ? ? ?Therapy/Group: Individual Therapy ? ?Rich Brave ?09/16/2021, 8:04 AM ?

## 2021-09-16 NOTE — Progress Notes (Signed)
Physical Therapy Session Note ? ?Patient Details  ?Name: Mark Herrera ?MRN: [id number] ?Date of Birth: 1960/07/06 ? ?Today's Date: 09/16/2021 ?PT Individual Time: 0350-0938 ?PT Individual Time Calculation (min): 24 min  ? ?Short Term Goals: ?Week 1:  PT Short Term Goal 1 (Week 1): Pt will complete bed<>chair transfers with CGA and LRAD ?PT Short Term Goal 2 (Week 1): Pt will ambulate 170ft with CGA and LRAD ?PT Short Term Goal 3 (Week 1): Pt will complete outcome measure to assess falls risk ? ?Skilled Therapeutic Interventions/Progress Updates:  ?  Session focused on community reintegration including gait outdoors on uneven surfaces and stair negotiation without AD. Pt requires overall CGA for balance and occasional cues for attention to L foot clearance (no impact on balance noted). Also discussed stroke risk factors and signs/symptoms and educated on progression of mobility upon discharge. Pt very motivated to improve. Transfers in room close supervision to CGA and independent with bed mobility.  ? ?Therapy Documentation ?Precautions:  ?Precautions ?Precautions: Fall ?Precaution Comments: L hemi ?Restrictions ?Weight Bearing Restrictions: No ? ?Pain: ? Denies pain. ? ? ? ?Therapy/Group: Individual Therapy ? ?Tedd Sias ?Philip Aspen, PT, DPT, CBIS ? ?09/16/2021, 9:14 AM  ?

## 2021-09-17 DIAGNOSIS — I63411 Cerebral infarction due to embolism of right middle cerebral artery: Secondary | ICD-10-CM

## 2021-09-17 NOTE — Progress Notes (Signed)
Occupational Therapy Weekly Progress Note ? ?Patient Details  ?Name: Mark Herrera ?MRN: [id number] ?Date of Birth: [birth year] ? ?Beginning of progress report period: September 10, 2021 ?End of progress report period: September 17, 2021 ? ?Patient has met 3 of 3 short term goals.  Pt is making excellent progress with BADLs and functional transfers. Pt completes bathing and LB dressing tasks with CGA when standing. Functional transfers with CGA/close supervision. Pt using LUE at diminished level in all functional tasks. Pt requires min verbal cues to attend to Lt when ambulating. Min verbal cues for safety awareness. ? ?Patient continues to demonstrate the following deficits: muscle weakness, decreased cardiorespiratoy endurance, unbalanced muscle activation, decreased coordination, and decreased motor planning, decreased attention, decreased awareness, and decreased safety awareness, and decreased standing balance, decreased postural control, hemiplegia, and decreased balance strategies and therefore will continue to benefit from skilled OT intervention to enhance overall performance with BADL. ? ?Patient progressing toward long term goals..  Continue plan of care. ? ?OT Short Term Goals ?Week 1:  OT Short Term Goal 1 (Week 1): Pt will complete 1/3 toileting steps with no more than min cues ?OT Short Term Goal 1 - Progress (Week 1): Met ?OT Short Term Goal 2 (Week 1): Pt will complete sit > stand in prep for ADL with min A ?OT Short Term Goal 2 - Progress (Week 1): Met ?OT Short Term Goal 3 (Week 1): Pt will donn UB shirt with min A with min cues needed for hemi-technique ?OT Short Term Goal 3 - Progress (Week 1): Met ?Week 2:  OT Short Term Goal 1 (Week 2): STG=LTG 2/2 ELOS ? ? ?Mark Herrera ?09/17/2021, 6:56 AM  ?

## 2021-09-17 NOTE — Progress Notes (Signed)
Physical Therapy Session Note ? ?Patient Details  ?Name: Mark Herrera ?MRN: [id number] ?Date of Birth: Oct 04, 1960 ? ?Today's Date: 09/17/2021 ?PT Individual Time: 1300-1330 ?PT Individual Time Calculation (min): 30 min  ? ?Short Term Goals: ?Week 1:  PT Short Term Goal 1 (Week 1): Pt will complete bed<>chair transfers with CGA and LRAD ?PT Short Term Goal 2 (Week 1): Pt will ambulate 149ft with CGA and LRAD ?PT Short Term Goal 3 (Week 1): Pt will complete outcome measure to assess falls risk ? ?Skilled Therapeutic Interventions/Progress Updates:  ?  Session focused on NMR for higher level balance retraining, gait without AD, floor transfers and education on fall recovery, and core/trunk balance training in quadruped. Pt performs gait on unit without AD with overall CGA and improved overall clearance of LLE today but does demonstrate more mild LOB's especially in distracting environment. Simulated fall and floor transfer training x 3 reviewing safety procedures and what to do in case of fall. Pt performed at supervision level. Pt verbalized understanding and able to verbally and physically demonstrate. In quadruped performed bird dog with alternating UE/LE for trunk/core and balance retraining as well as LLE/LUE NMR x 10-15 reps each. Progressed to modified push-ups x 5 and then self selected tall kneeling with raising arms overhead to "test out balance". Cues for technique provided and feedback about positioning. Handoff to OT. ? ?Therapy Documentation ?Precautions:  ?Precautions ?Precautions: Fall ?Precaution Comments: L hemi ?Restrictions ?Weight Bearing Restrictions: No ? ? ?Pain: ?No complaints of pain.  ? ? ? ?Therapy/Group: Individual Therapy ? ?Mark Herrera ?Philip Aspen, PT, DPT, CBIS ? ?09/17/2021, 2:53 PM  ?

## 2021-09-17 NOTE — Discharge Summary (Signed)
Physician Discharge Summary  ?Patient ID: ?Mark Herrera ?MRN: [id number] ?DOB/AGE: [birth year] 59 y.o. ? ?Admit date: 09/09/2021 ?Discharge date: 09/20/2021 ? ?Discharge Diagnoses:  ?Principal Problem: ?  CVA (cerebral vascular accident) (HCC) ?Active Problems: ?  Right middle cerebral artery stroke (HCC) ?  Bradycardia ?  Dyslipidemia ?  Polysubstance abuse (HCC) ?Tobacco use ?CAD with stenting ?Gout ? ?Discharged Condition: Stable ? ?Significant Diagnostic Studies: ?CT HEAD WO CONTRAST ( ) ? ?Result Date: 09/06/2021 ?CLINICAL DATA:  Acute neurological deficit. Stroke suspected. TNK follow-up. EXAM: CT HEAD WITHOUT CONTRAST TECHNIQUE: Contiguous axial images were obtained from the base of the skull through the vertex without intravenous contrast. RADIATION DOSE REDUCTION: This exam was performed according to the departmental dose-optimization program which includes automated exposure control, adjustment of the mA and/or kV according to patient size and/or use of iterative reconstruction technique. COMPARISON:  CT 09/05/2021, MRI 06/01/2020 FINDINGS: Brain: Focal area of low-attenuation with loss of gray-white junction and sulcal effacement demonstrated in the right parietal lobe consistent with early subacute infarct in the distribution of the right middle cerebral artery. Older area of encephalomalacia corresponding to old infarct in the right temporal lobe and insular region. No developing midline shift. No ventricular dilatation. No acute intracranial hemorrhage. Vascular: Mild intracranial arterial vascular calcifications. Skull: Calvarium appears intact. Sinuses/Orbits: Mucosal thickening in the paranasal sinuses. No acute air-fluid levels. Mastoid air cells are clear. Other: None. IMPRESSION: 1. Changes consistent with early subacute infarct in the right parietal lobe, representing progression from an old area of infarct seen on previous studies. MRI is recommended for further evaluation. 2. No acute  intracranial hemorrhage or midline shift. Electronically Signed   By: Burman Nieves M.D.   On: 09/06/2021 20:03  ? ?MR BRAIN WO CONTRAST ? ?Result Date: 09/08/2021 ?CLINICAL DATA:  Stroke workup EXAM: MRI HEAD WITHOUT CONTRAST TECHNIQUE: Multiplanar, multiecho pulse sequences of the brain and surrounding structures were obtained without intravenous contrast. COMPARISON:  Head CT and CTA from yesterday in the preceding day FINDINGS: Brain: Acute right MCA branch infarct affecting the superficial temporal lobe and extending into the right frontal and parietal cortex, roughly following the sylvian fissure. Mild white matter involvement extending towards the lateral ventricle. Pre-existing right insular infarct. No hematoma, hydrocephalus, or collection. Mild chronic small vessel ischemic change in the hemispheric white matter and pons. Vascular: Major flow voids are preserved.  Recent CTA Skull and upper cervical spine: Normal marrow signal. C3-4 disc degeneration. Sinuses/Orbits: No significant finding IMPRESSION: 1. Acute right MCA branch infarct as described. Pre-existing right insular cortex infarct. 2. Chronic small vessel ischemia in the hemispheric white matter and pons. Electronically Signed   By: Tiburcio Pea M.D.   On: 09/08/2021 11:26  ? ?ECHOCARDIOGRAM COMPLETE ? ?Result Date: 09/06/2021 ?   ECHOCARDIOGRAM REPORT   Patient Name:   Mark Herrera Date of Exam: 09/06/2021 Medical Rec #:  [id number]         Height:       68.5 in Accession #:    [id number]        Weight:       171.5 lb Date of Birth:  07/09/1960          BSA:          1.925 m? Patient Age:    60 years          BP:           115/73 mmHg Patient Gender: M  HR:           54 bpm. Exam Location:  Inpatient Procedure: 2D Echo, 3D Echo, Color Doppler, Cardiac Doppler and Intracardiac            Opacification Agent Indications:    Stroke i63.9  History:        Patient has prior history of Echocardiogram examinations, most                  recent 06/01/2020. CAD; Risk Factors:Hypertension and                 Dyslipidemia.  Sonographer:    Irving Burton Senior RDCS Referring Phys: [phone] ERIC LINDZEN IMPRESSIONS  1. LV apical false tendon (normal variant) - no mural thrombus. Left ventricular ejection fraction, by estimation, is 30 to 35%. Left ventricular ejection fraction by 3D volume is 34 %. The left ventricle has moderately decreased function. The left ventricle demonstrates global hypokinesis. The left ventricular internal cavity size was mildly dilated. Left ventricular diastolic parameters are consistent with Grade I diastolic dysfunction (impaired relaxation).  2. Right ventricular systolic function is normal. The right ventricular size is normal. Tricuspid regurgitation signal is inadequate for assessing PA pressure.  3. Left atrial size was mildly dilated.  4. The mitral valve is abnormal. Trivial mitral valve regurgitation.  5. The aortic valve is tricuspid. Aortic valve regurgitation is not visualized. Aortic valve sclerosis/calcification is present, without any evidence of aortic stenosis.  6. The inferior vena cava is normal in size with greater than 50% respiratory variability, suggesting right atrial pressure of 3 mmHg.  7. Cannot exclude a small PFO. Comparison(s): Changes from prior study are noted. 06/01/2020: LVEF 25-30%, grade 2 DD. FINDINGS  Left Ventricle: LV apical false tendon (normal variant) - no mural thrombus. Left ventricular ejection fraction, by estimation, is 30 to 35%. Left ventricular ejection fraction by 3D volume is 34 %. The left ventricle has moderately decreased function. The left ventricle demonstrates global hypokinesis. Definity contrast agent was given IV to delineate the left ventricular endocardial borders. The left ventricular internal cavity size was mildly dilated. There is no left ventricular hypertrophy. Left ventricular diastolic parameters are consistent with Grade I diastolic dysfunction (impaired  relaxation). Indeterminate filling pressures. Right Ventricle: The right ventricular size is normal. No increase in right ventricular wall thickness. Right ventricular systolic function is normal. Tricuspid regurgitation signal is inadequate for assessing PA pressure. Left [facility]: Left atrial size was mildly dilated. Right [facility]: Right atrial size was normal in size. Pericardium: There is no evidence of pericardial effusion. Mitral Valve: The mitral valve is abnormal. There is mild thickening of the anterior and posterior mitral valve leaflet(s). Trivial mitral valve regurgitation. Tricuspid Valve: The tricuspid valve is grossly normal. Tricuspid valve regurgitation is not demonstrated. Aortic Valve: The aortic valve is tricuspid. Aortic valve regurgitation is not visualized. Aortic valve sclerosis/calcification is present, without any evidence of aortic stenosis. Pulmonic Valve: The pulmonic valve was normal in structure. Pulmonic valve regurgitation is not visualized. Aorta: The aortic root and ascending aorta are structurally normal, with no evidence of dilitation. Venous: The inferior vena cava is normal in size with greater than 50% respiratory variability, suggesting right atrial pressure of 3 mmHg. IAS/Shunts: The interatrial septum is aneurysmal. Cannot exclude a small PFO.  LEFT VENTRICLE PLAX 2D LVIDd:         5.90 cm         Diastology LVIDs:         4.80  cm         LV e' medial:    7.07 cm/s LV PW:         0.80 cm         LV E/e' medial:  9.3 LV IVS:        0.80 cm         LV e' lateral:   5.33 cm/s LVOT diam:     2.30 cm         LV E/e' lateral: 12.4 LV SV:         83 LV SV Index:   43 LVOT Area:     4.15 cm?        3D Volume EF                                LV 3D EF:    Left                                             ventricul                                             ar                                             ejection                                             fraction                                              by 3D                                             volume is                                             34 %.                                 3D Volume EF:                                3D EF:

## 2021-09-17 NOTE — Progress Notes (Signed)
Occupational Therapy Session Note ? ?Patient Details  ?Name: Mark Herrera ?MRN: [id number] ?Date of Birth: 1961/04/01 ? ?Today's Date: 09/17/2021 ?OT Individual Time: 7824-2353 ?OT Individual Time Calculation (min): 55 min  ? ? ?Short Term Goals: ?Week 2:  OT Short Term Goal 1 (Week 2): STG=LTG 2/2 ELOS ? ?Skilled Therapeutic Interventions/Progress Updates:  ?  Pt received from PT. OT intervention with focus on functional amb with/without RW in community setting inside hospital and on sidewalk at [facility] entrance. Pt amb with RW from gym to entrance by [facility]. After rest on bench, pt amb without AD to bench on opposite side of courtyard. Pt amb without RW across walkway to bench at top of hill. Pt amb without AD to bottom of hill and rested on bench. Pt amb without AD up hill and in entrance before resting. Pt amb without AD from [facility] entrance to room without rest. Focus on safety awareness. All amb with CGA. Pt able to engage in conversation with no LOB. Pt returned to room and remained in bed with all needs within reach. Bed alarm activated.  ? ?Therapy Documentation ?Precautions:  ?Precautions ?Precautions: Fall ?Precaution Comments: L hemi ?Restrictions ?Weight Bearing Restrictions: No ? ?Pain: ? Pt denies pain this afternoon ? ? ?Therapy/Group: Individual Therapy ? ?Rich Brave ?09/17/2021, 2:28 PM ?

## 2021-09-17 NOTE — Progress Notes (Signed)
Occupational Therapy Session Note ? ?Patient Details  ?Name: Mark Herrera ?MRN: [id number] ?Date of Birth: [birth year] ? ?Today's Date: 09/17/2021 ?OT Individual Time: [phone] ?OT Individual Time Calculation (min): 74 min  ? ? ?Short Term Goals: ?Week 2:  OT Short Term Goal 1 (Week 2): STG=LTG 2/2 ELOS ? ?Skilled Therapeutic Interventions/Progress Updates:  ?  OT intervention with focus on BADL retraining, functional amb with RW, standing balance, functional amb without AD, activity tolerance and safety awareness to increase independence with BADLs. Pt amb with RW into bathroom and removed underpants while standing. Min A when supported by LLE only. Bathing at shower level with supervision. Dressing with CGA when standing to pull pants over hips. Shaving and oral care standing at sink. Pt amb with RW to gym for activities: standing balance using heavy boxing bag with feet staggered in boxing stance-CGA. Tossing 1kg ball against rebounder on compliant and noncompliant surface with CGA. Pt amb back to nursing station with RW. Pt amb without AD to room and back to nursing station. Pt returned to room with RW. Pt requires min verbal cues for safety awareness when amb. Pt remained in bed with all needs within reach and belt alarm activated.  ? ?Therapy Documentation ?Precautions:  ?Precautions ?Precautions: Fall ?Precaution Comments: L hemi ?Restrictions ?Weight Bearing Restrictions: No ? ?Pain: ?Pain Assessment ?Pain Scale: 0-10 ?Pain Score: 0-No pain ? ? ?Therapy/Group: Individual Therapy ? ?Rich Brave ?09/17/2021, 9:34 AM ?

## 2021-09-17 NOTE — Progress Notes (Signed)
Physical Therapy Session Note ? ?Patient Details  ?Name: Mark Herrera ?MRN: [id number] ?Date of Birth: 08/19/1960 ? ?Today's Date: 09/17/2021 ?PT Individual Time: 1030-1150  ?PT Individual Time Calculation (min): 80 min  ? ?Short Term Goals: ?Week 1:  PT Short Term Goal 1 (Week 1): Pt will complete bed<>chair transfers with CGA and LRAD ?PT Short Term Goal 2 (Week 1): Pt will ambulate 148ft with CGA and LRAD ?PT Short Term Goal 3 (Week 1): Pt will complete outcome measure to assess falls risk ? ?Skilled Therapeutic Interventions/Progress Updates:  ?Pt received asleep in bed but easily awoken with touch to the hand and eager for therapy. STS to RW CGA. Gait training was initiated at start of session; pt ambulated without AD CGA in controlled environment x255ft, intermittent verbal cuing for keeping "chest up", required increased time but showed more consistent L foot clearance and safety awareness during ambulation. Obstacle course navigation initiated x5 trials to target balance, proprioception, coordination, gait, curb navigation, and safety awareness; all obstacles navigated with RW at least for 1 trial, but majority of trials performed without AD, seated rest breaks in between trials. Included the following: ?- cone taps (minA with HH assist for support; LOB to the L noted) ?- 6" curb navigation (CGA with HH assist) ?- forward tandem walking and retrogait (normal stance) x2ft (minA with HH assist; lateral sway noted with tandem walking, shortened stride with retrogait) ?- single-leg 3-point taps onto colored dots (CGA with HH assist) ?- side-stepping in agility ladder (CGA-minA with HH assist; more difficult towards the L d/t proprioceptive deficits) ? ?Gait initiated x1000+ ft for community integration, which included ambulating in controlled environment, outdoors, across uneven surfaces, decline surface, getting in/out of elevator, and stairs navigation while carrying out conversation for added cognitive  load. Stairs navigation occurred outside for 3 trials x4 steps with CGA using 1HR then attempting with [facility] and close-guarding per pt request to be able to feel his balance (but demonstrates verbal understanding that navigating stairs with 1HR is safest upon D/C). Overall, no LOB noted, but demonstrated decreased step length and increased time to ambulate with fatigue. Pt reports feeling adequate endurance throughout but is easily distractible, requiring frequent verbal cuing to re-attend to therapy task. Pt was left in bed after session with safety alarm active and all needs in reach. ? ?Therapy Documentation ?Precautions:  ?Precautions ?Precautions: Fall ?Precaution Comments: L hemi ?Restrictions ?Weight Bearing Restrictions: No ? ?Therapy/Group: Individual Therapy ? ?Charmian Muff ?09/17/2021, 3:57 PM  ?

## 2021-09-17 NOTE — Progress Notes (Signed)
?                                                       PROGRESS NOTE ? ? ?Subjective/Complaints: ? ?Pt reports sleeping well- wants to move up d/c date from Friday- will d/w therapy.  ?No issues.  ?LBM in last 24 hours.  ?GF says she can take him home early.  ? ?ROS:  ?Pt denies SOB, abd pain, CP, N/V/C/D, and vision changes ? ? ?Objective: ?  ?No results found. ?No results for input(s): WBC, HGB, HCT, PLT in the last 72 hours. ? ?No results for input(s): NA, K, CL, CO2, GLUCOSE, BUN, CREATININE, CALCIUM in the last 72 hours. ? ? ?Intake/Output Summary (Last 24 hours) at 09/17/2021 0947 ?Last data filed at 09/17/2021 0500 ?Gross per 24 hour  ?Intake 237 ml  ?Output 1250 ml  ?Net -1013 ml  ?  ? ?  ? ?Physical Exam: ?Vital Signs ?Blood pressure 119/84, pulse (!) 55, temperature 97.7 ?F (36.5 ?C), temperature source Oral, resp. rate 14, height 5\' 9"  (1.753 m), weight 72.3 kg, SpO2 100 %. ? ? ?General: awake, alert, appropriate, sitting up in bed; GF at bedside; nurse in room; NAD ?HENT: dysconjugate gaze; oropharynx moist ?CV: regular rate; no JVD ?Pulmonary: CTA B/L; no W/R/R- good air movement ?GI: soft, NT, ND, (+)BS ?Psychiatric: appropriate- bright affect ?Neurological: Ox3 ? ?Neuro/Musculoskeletal: Left homonymous hemianopsia.  ?Motor: 4/5 strength throughout left side. Mild dysmetria ? ?Assessment/Plan: ?1. Functional deficits which require 3+ hours per day of interdisciplinary therapy in a comprehensive inpatient rehab setting. ?Physiatrist is providing close team supervision and 24 hour management of active medical problems listed below. ?Physiatrist and rehab team continue to assess barriers to discharge/monitor patient progress toward functional and medical goals ? ?Care Tool: ? ?Bathing ?   ?Body parts bathed by patient: Right arm, Left arm, Chest, Abdomen, Front perineal area, Buttocks, Right upper leg, Left upper leg, Right lower leg, Left lower leg, Face  ? Body parts bathed by helper: Right lower leg,  Left lower leg, Buttocks ?  ?  ?Bathing assist Assist Level: Supervision/Verbal cueing ?  ?  ?Upper Body Dressing/Undressing ?Upper body dressing   ?What is the patient wearing?: Pull over shirt ?   ?Upper body assist Assist Level: Independent ?   ?Lower Body Dressing/Undressing ?Lower body dressing ? ? ?   ?What is the patient wearing?: Pants, Underwear/pull up ? ?  ? ?Lower body assist Assist for lower body dressing: Contact Guard/Touching assist ?   ? ?Toileting ?Toileting    ?Toileting assist Assist for toileting: Contact Guard/Touching assist ?  ?  ?Transfers ?Chair/bed transfer ? ?Transfers assist ?   ? ?Chair/bed transfer assist level: Supervision/Verbal cueing ?  ?  ?Locomotion ?Ambulation ? ? ?Ambulation assist ? ?   ? ?Assist level: Contact Guard/Touching assist ?Assistive device: No Device ?Max distance: 250'  ? ?Walk 10 feet activity ? ? ?Assist ?   ? ?Assist level: Contact Guard/Touching assist ?Assistive device: No Device  ? ?Walk 50 feet activity ? ? ?Assist   ? ?Assist level: Contact Guard/Touching assist ?Assistive device: No Device  ? ? ?Walk 150 feet activity ? ? ?Assist Walk 150 feet activity did not occur: Safety/medical concerns ? ?Assist level: Contact Guard/Touching assist ?Assistive device: No Device ?  ? ?Walk 10 feet  on uneven surface  ?activity ? ? ?Assist Walk 10 feet on uneven surfaces activity did not occur: Safety/medical concerns ? ? ?Assist level: Contact Guard/Touching assist ?Assistive device: Walker-rolling  ? ?Wheelchair ? ? ? ? ?Assist Is the patient using a wheelchair?: No ?  ?  ? ?  ?   ? ? ?Wheelchair 50 feet with 2 turns activity ? ? ? ?Assist ? ?  ?  ? ? ?   ? ?Wheelchair 150 feet activity  ? ? ? ?Assist ?   ? ? ?   ? ?Blood pressure 119/84, pulse (!) 55, temperature 97.7 ?F (36.5 ?C), temperature source Oral, resp. rate 14, height 5\' 9"  (1.753 m), weight 72.3 kg, SpO2 100 %. ? ?Medical Problem List and Plan: ?1. Functional deficits secondary to right MCA infarction ?             Con't CIR- PT, OT and SLP- will ask if can move up d/c date.  ?2. High grade intracranial stenosis: continue aspirin and plavix (will need total 90 days) and DVT prophylaxis with SCDs.  ?3. HLD: LDL is 105, continue atorvastatin 40mg  ?4. Situational depression: provide encouragement regarding improvements in sensation and strength already.  ? Continue Celexa 20 mg daily, started 4/11. ?5. Neuropsych: This patient is capable of making decisions on his own behalf. ?6. Impulsivity: SLP ordered ? 4/17- SLP discharged pt- doing so well ?7. Active smoker: continue smoking cessation counseling ? continue Nicotine patch 7 mcg daily, patient states it is helping ?8. Active THC use: continue providing cessation counseling ?9. Active cocaine use: continue providing cessation counseling ? 4/17- educated pt on stopping illicit drug use.  ?10. History of prior R MCA stroke: provide stroke prevention counseling ?11. Hyperglycemia: HgbA1c is 5.3, educate regarding low added sugar in diet.  ?12. Hypoalbuminemia: educated regarding low sugar/high protein diet.  ?13. Urinary frequency/urgency/accidents-UC reviewed and shows insignificant growth.  ?14. Toothache: oragel ordered and helpful, continue PRN.  ?15. Bradycardic: continue to monitor HR TID ? 4/17- Asymptomatic- in 50s-  ? ? ? ?LOS: ?8 days ?A FACE TO FACE EVALUATION WAS PERFORMED ? ?Eliz Nigg ?09/17/2021, 9:47 AM  ? ? ? ?

## 2021-09-18 NOTE — Patient Care Conference (Signed)
Inpatient RehabilitationTeam Conference and Plan of Care Update ?Date: 09/18/2021   Time: 11:00 AM  ? ? ?Patient Name: Mark Herrera      ?Medical Record Number: [id number]  ?Date of Birth: [birth year] ?Sex: Male         ?Room/Bed: 1B51W/2H85I-77 ?Payor Info: Payor: MEDICAID POTENTIAL / Plan: MEDICAID POTENTIAL / Product Type: *No Product type* /   ? ?Admit Date/Time:  09/09/2021 11:50 AM ? ?Primary Diagnosis:  CVA (cerebral vascular accident) (HCC) ? ?Hospital Problems: Principal Problem: ?  CVA (cerebral vascular accident) (HCC) ?Active Problems: ?  Right middle cerebral artery stroke (HCC) ?  Bradycardia ?  Dyslipidemia ?  Polysubstance abuse (HCC) ? ? ? ?Expected Discharge Date: Expected Discharge Date: 09/20/21 ? ?Team Members Present: ?Physician leading conference: Dr. Genice Rouge ?Social Worker Present: Lavera Guise, BSW ?Nurse Present: Kennyth Arnold, RN ?PT Present: Peter Congo, PT ?OT Present: Ardis Rowan, Jaynee Eagles, OT ?SLP Present: Gerda Diss, SLP ?PPS Coordinator present : Fae Pippin, SLP ? ?   Current Status/Progress Goal Weekly Team Focus  ?Bowel/Bladder ? ? Pt continent of B/B. LBM 09/17/2021  Regular BMs every 3 days or less  Assess B/B every shift and PRN   ?Swallow/Nutrition/ Hydration ? ?           ?ADL's ? ? CGA/supervision BADLs, CGA functional transfers, min verbal cues safety awareness  supervision overall  education, safety awareness, standing balance   ?Mobility ? ? CGA transfers, CGA gait with RW x1000+ ft, CGA stairs 1HR  Supervision overall  L NMR, gait, stairs, safety awareness   ?Communication ? ?           ?Safety/Cognition/ Behavioral Observations ?           ?Pain ? ? Pt denies pain.  Pain scale less than 3/10  Assess pain every shift and PRN   ?Skin ? ? Skin remains intact  no skin breakdown  Assess skin every shift and PRN   ? ? ?Discharge Planning:  ?Uninsured. D/c to home with his son [name] and support from various family members.   ?Team  Discussion: ?Requesting to leave on  09/20/2021. Will accommodate. Bradycardic, asymptomatic. Urinary frequency improved. Continent B/B, LBM 4/16. No pain reported. Discharging home with son. Uninsured. ? ?Patient on target to meet rehab goals: ?yes, supervision overall. Currently CGA for gait due to safety awareness. Close to goal level. ? ?*See Care Plan and progress notes for long and short-term goals.  ? ?Revisions to Treatment Plan:  ?Adjusting medications, moved discharge date up 1 day. ?  ?Teaching Needs: ?Family education, medication management, safety awareness, transfer/gait training, etc. ?  ?Current Barriers to Discharge: ?Decreased caregiver support, Home enviroment access/layout, Lack of/limited family support, Medication compliance, and Behavior ? ?Possible Resolutions to Barriers: ?Family education ?Follow-up PT/OT ?Order recommended DME ?  ? ? Medical Summary ?Current Status: continent B/B- no skin issues- mood is better since started Celexa- no pain; ? Barriers to Discharge: Behavior;Home enviroment access/layout;Medication compliance ? Barriers to Discharge Comments: going home with GF and son- is uninsured ?Possible Resolutions to Barriers/Weekly Focus: CGA for everything- needs CGA due to safety awareness main limiter- - goals supervision- d/c will move to 09/20/21 ? ? ?Continued Need for Acute Rehabilitation Level of Care: The patient requires daily medical management by a physician with specialized training in physical medicine and rehabilitation for the following reasons: ?Direction of a multidisciplinary physical rehabilitation program to maximize functional independence : Yes ?Medical management of patient stability for increased  activity during participation in an intensive rehabilitation regime.: Yes ?Analysis of laboratory values and/or radiology reports with any subsequent need for medication adjustment and/or medical intervention. : Yes ? ? ?I attest that I was present, lead the team  conference, and concur with the assessment and plan of the team. ? ? ?Kennyth Arnold G ?09/18/2021, 2:45 PM  ? ? ? ? ? ? ?

## 2021-09-18 NOTE — Progress Notes (Signed)
Patient ID: Mark Herrera, male   DOB: 1960/12/21, 61 y.o.   MRN: [id number] ?Team Conference Report to Patient/Family ? ?Team Conference discussion was reviewed with the patient and caregiver, including goals, any changes in plan of care and target discharge date.  Patient and caregiver express understanding and are in agreement.  The patient has a target discharge date of 09/20/21. ? ?Sw met with patient and provided team conference updates. Patient excited about discharging sooner on 4/20. The patient would like to discuss disability.  ? ?Dyanne Iha ?09/18/2021, 12:27 PM  ?

## 2021-09-18 NOTE — Progress Notes (Signed)
Occupational Therapy Session Note ? ?Patient Details  ?Name: Mark Herrera ?MRN: [id number] ?Date of Birth: 1960-08-13 ? ?Today's Date: 09/18/2021 ?OT Individual Time: [id number] ?OT Individual Time Calculation (min): 59 min  ? ? ?Short Term Goals: ?Week 2:  OT Short Term Goal 1 (Week 2): STG=LTG 2/2 ELOS ? ?Skilled Therapeutic Interventions/Progress Updates:  ?  Pt seated EOB eating breakfast upon arrival. Pt declined shower this morning and elected to wash face, brush teeth, and change clothing before amb without AD to day room. NuStep: 5 mins level 7 BUE/BLE and 5 mins level 5 BLE only. Agility activities with Hoverball requiring min/mod A for balance. Crossover stepping in hallway holding on to rails with min A. Crossover stepping in hallway without rails with mod A. Pt amb back to room without AD (CGA) and returned to bed. Pt remained in bed with bed alarm activated. All needs wihtin reach. ? ?Therapy Documentation ?Precautions:  ?Precautions ?Precautions: Fall ?Precaution Comments: L hemi ?Restrictions ?Weight Bearing Restrictions: No ? ?Pain: ? Pt denies pain this morning ? ? ?Therapy/Group: Individual Therapy ? ?[name] ?09/18/2021, 7:59 AM ?

## 2021-09-18 NOTE — Progress Notes (Signed)
?                                                       PROGRESS NOTE ? ? ?Subjective/Complaints: ? ?D/w pt and OTA- ?Will d/c Thursday- move d/c date up a day.  ? ? ?Walking in hall- no issues ?ROS:  ? ? ?Pt denies SOB, abd pain, CP, N/V/C/D, and vision changes ? ? ?Objective: ?  ?No results found. ?No results for input(s): WBC, HGB, HCT, PLT in the last 72 hours. ? ?No results for input(s): NA, K, CL, CO2, GLUCOSE, BUN, CREATININE, CALCIUM in the last 72 hours. ? ? ?Intake/Output Summary (Last 24 hours) at 09/18/2021 0915 ?Last data filed at 09/18/2021 0700 ?Gross per 24 hour  ?Intake 1180 ml  ?Output 825 ml  ?Net 355 ml  ?  ? ?  ? ?Physical Exam: ?Vital Signs ?Blood pressure 126/75, pulse (!) 57, temperature 98 ?F (36.7 ?C), temperature source Oral, resp. rate 18, height 5\' 9"  (1.753 m), weight 72.3 kg, SpO2 100 %. ? ? ? ?General: awake, alert, appropriate, walking in hall with OTA; NAD ?HENT: conjugate gaze; oropharynx moist ?CV: regular rate; no JVD ?Pulmonary: CTA B/L; no W/R/R- good air movement ?GI: soft, NT, ND, (+)BS ?Psychiatric: appropriate ?Neurological: Ox3 ?Neuro/Musculoskeletal: Left homonymous hemianopsia.  ?Motor: 4/5 strength throughout left side. Mild dysmetria ? ?Assessment/Plan: ?1. Functional deficits which require 3+ hours per day of interdisciplinary therapy in a comprehensive inpatient rehab setting. ?Physiatrist is providing close team supervision and 24 hour management of active medical problems listed below. ?Physiatrist and rehab team continue to assess barriers to discharge/monitor patient progress toward functional and medical goals ? ?Care Tool: ? ?Bathing ?   ?Body parts bathed by patient: Right arm, Left arm, Chest, Abdomen, Front perineal area, Buttocks, Right upper leg, Left upper leg, Right lower leg, Left lower leg, Face  ? Body parts bathed by helper: Right lower leg, Left lower leg, Buttocks ?  ?  ?Bathing assist Assist Level: Supervision/Verbal cueing ?  ?  ?Upper Body  Dressing/Undressing ?Upper body dressing   ?What is the patient wearing?: Pull over shirt ?   ?Upper body assist Assist Level: Independent ?   ?Lower Body Dressing/Undressing ?Lower body dressing ? ? ?   ?What is the patient wearing?: Pants, Underwear/pull up ? ?  ? ?Lower body assist Assist for lower body dressing: Contact Guard/Touching assist ?   ? ?Toileting ?Toileting    ?Toileting assist Assist for toileting: Contact Guard/Touching assist ?  ?  ?Transfers ?Chair/bed transfer ? ?Transfers assist ?   ? ?Chair/bed transfer assist level: Supervision/Verbal cueing ?  ?  ?Locomotion ?Ambulation ? ? ?Ambulation assist ? ?   ? ?Assist level: Contact Guard/Touching assist ?Assistive device: No Device ?Max distance: 23ft  ? ?Walk 10 feet activity ? ? ?Assist ?   ? ?Assist level: Contact Guard/Touching assist ?Assistive device: No Device  ? ?Walk 50 feet activity ? ? ?Assist   ? ?Assist level: Contact Guard/Touching assist ?Assistive device: No Device  ? ? ?Walk 150 feet activity ? ? ?Assist Walk 150 feet activity did not occur: Safety/medical concerns ? ?Assist level: Contact Guard/Touching assist ?Assistive device: No Device ?  ? ?Walk 10 feet on uneven surface  ?activity ? ? ?Assist Walk 10 feet on uneven surfaces activity did not occur: Safety/medical  concerns ? ? ?Assist level: Contact Guard/Touching assist ?Assistive device: Walker-rolling  ? ?Wheelchair ? ? ? ? ?Assist Is the patient using a wheelchair?: No ?  ?  ? ?  ?   ? ? ?Wheelchair 50 feet with 2 turns activity ? ? ? ?Assist ? ?  ?  ? ? ?   ? ?Wheelchair 150 feet activity  ? ? ? ?Assist ?   ? ? ?   ? ?Blood pressure 126/75, pulse (!) 57, temperature 98 ?F (36.7 ?C), temperature source Oral, resp. rate 18, height 5\' 9"  (1.753 m), weight 72.3 kg, SpO2 100 %. ? ?Medical Problem List and Plan: ?1. Functional deficits secondary to right MCA infarction ?         Continue CIR- PT, OT and SLP ? Team conference today to determine d/c date- sounds like can leave  Thursday 4/20 ?2. High grade intracranial stenosis: continue aspirin and plavix (will need total 90 days) and DVT prophylaxis with SCDs.  ?3. HLD: LDL is 105, continue atorvastatin 40mg  ?4. Situational depression: provide encouragement regarding improvements in sensation and strength already.  ? Continue Celexa 20 mg daily, started 4/11. ?5. Neuropsych: This patient is capable of making decisions on his own behalf. ?6. Impulsivity: SLP ordered ? 4/17- SLP discharged pt- doing so well ?7. Active smoker: continue smoking cessation counseling ? continue Nicotine patch 7 mcg daily, patient states it is helping ? 4/18- says no urge to smoke ?8. Active THC use: continue providing cessation counseling ?9. Active cocaine use: continue providing cessation counseling ? 4/17- educated pt on stopping illicit drug use.  ?10. History of prior R MCA stroke: provide stroke prevention counseling ?11. Hyperglycemia: HgbA1c is 5.3, educate regarding low added sugar in diet.  ?12. Hypoalbuminemia: educated regarding low sugar/high protein diet.  ?13. Urinary frequency/urgency/accidents-UC reviewed and shows insignificant growth.  ?14. Toothache: oragel ordered and helpful, continue PRN.  ?15. Bradycardic: continue to monitor HR TID ? 4/17- Asymptomatic- in 50s-  ? ?I spent a total of 35   minutes on total care today- >50% coordination of care- due to team conference and discussing d/c date.  ? ? ?LOS: ?9 days ?A FACE TO FACE EVALUATION WAS PERFORMED ? ?Mark Herrera ?09/18/2021, 9:15 AM  ? ? ? ?

## 2021-09-18 NOTE — Progress Notes (Signed)
Occupational Therapy Session Note ? ?Patient Details  ?Name: Mark Herrera ?MRN: [id number] ?Date of Birth: 17-Apr-1961 ? ?Today's Date: 09/18/2021 ?OT Individual Time: 1130-1200 ?OT Individual Time Calculation (min): 30 min  ? ? ?Short Term Goals: ?Week 2:  OT Short Term Goal 1 (Week 2): STG=LTG 2/2 ELOS ? ?Skilled Therapeutic Interventions/Progress Updates:  ?  OT intervention with focus on functional amb wihtout AD, standing balance, LUE functional use, and safety awareness to increase independence with BADLs. Pt amb without AD (CGA) to day room and performed [facility] tasks with theraputty and small beads as well as grasp strengthening tasks with clothespins. Pt amb to gym and carried large box with nuts/bolts to hi-lo table and placed bolts in appropriate holes and screwed nuts on using LUE. Pt returned box to shelf. Pt amb back to room carrying 6# bar bell in LUE with elbow at 90*. Pt returned to bed and remained in bed with all needs within reach. Bed alarm activated.  ? ?Therapy Documentation ?Precautions:  ?Precautions ?Precautions: Fall ?Precaution Comments: L hemi ?Restrictions ?Weight Bearing Restrictions: No ? ?Pain: ? Pt denies pain this morning ? ?Therapy/Group: Individual Therapy ? ?Rich Brave ?09/18/2021, 12:10 PM ?

## 2021-09-18 NOTE — Progress Notes (Signed)
Occupational Therapy Session Note ? ?Patient Details  ?Name: Mark Herrera ?MRN: [id number] ?Date of Birth: September 16, 1960 ? ?Today's Date: 09/18/2021 ?OT Individual Time: 5001-6429 ?OT Individual Time Calculation (min): 59 min  ? ? ?Short Term Goals: ?Week 1:  OT Short Term Goal 1 (Week 1): Pt will complete 1/3 toileting steps with no more than min cues ?OT Short Term Goal 1 - Progress (Week 1): Met ?OT Short Term Goal 2 (Week 1): Pt will complete sit > stand in prep for ADL with min A ?OT Short Term Goal 2 - Progress (Week 1): Met ?OT Short Term Goal 3 (Week 1): Pt will donn UB shirt with min A with min cues needed for hemi-technique ?OT Short Term Goal 3 - Progress (Week 1): Met ? ?Skilled Therapeutic Interventions/Progress Updates:  ?   ?Pt received in bed with no pain ageeable to OT  ? ?Therapeutic activity ?Functional mobility to/from all tx destinations with no AD and w/c follow for a rest break. Pt navigates outside courtyard with close S for level surfaces and CGA for unlevel surfaces. Pt goes up/down a full flight of outdoor steps with 1 rail reciprocally and mobilizes over on grass using step to pattern for curb step. Pt return to gym to complete yoga poses for deep WB, UE/LE activation and balance in quadroped (bird dog, thread the needle, cat/cow) as well as warriors 1 & 2 for strengthening isometricaly and attention to L knee ? ?Pt left at end of session in bathroom with RN in room to finish toileting ? ? ?Therapy Documentation ?Precautions:  ?Precautions ?Precautions: Fall ?Precaution Comments: L hemi ?Restrictions ?Weight Bearing Restrictions: No ?  ? ? ?Therapy/Group: Individual Therapy ? ?Mark Herrera ?09/18/2021, 6:50 AM ?

## 2021-09-18 NOTE — Progress Notes (Signed)
Occupational Therapy Session Note ? ?Patient Details  ?Name: Mark Herrera ?MRN: [id number] ?Date of Birth: [birth year] ? ?Today's Date: 09/18/2021 ?OT Individual Time: 1030-1100 ?OT Individual Time Calculation (min): 30 min  ? ? ?Short Term Goals: ?Week 1:  OT Short Term Goal 1 (Week 1): Pt will complete 1/3 toileting steps with no more than min cues ?OT Short Term Goal 1 - Progress (Week 1): Met ?OT Short Term Goal 2 (Week 1): Pt will complete sit > stand in prep for ADL with min A ?OT Short Term Goal 2 - Progress (Week 1): Met ?OT Short Term Goal 3 (Week 1): Pt will donn UB shirt with min A with min cues needed for hemi-technique ?OT Short Term Goal 3 - Progress (Week 1): Met ? ?Skilled Therapeutic Interventions/Progress Updates:  ?   ? ?Therapy Documentation ?Precautions:  ?Precautions ?Precautions: Fall ?Precaution Comments: L hemi ?Restrictions ?Weight Bearing Restrictions: No ? ? ?Pain:No c/o pain ? ?  ?Balance- ambulated to gym with close SBA/CGA of therapist holding gait belt ?  ?Exercises: Patient seen for strength and coordination skills working to improve control and refine movements of the LUE. After ambulating to gym worked with a medicine ball (1kg) to keep arms symmetrical while extended and transitioning through supination pronation and flexion/ extension. Followed with balancing the ball in the left hand to perform PNF diagonals with smooth controlled movements. Concluded with weight bearing tasks in plank on a mat and push up position on an elevated surface for scapular approximation and stability during weight shifts. Patient ambulated back to room with SBA/CGA. Patient displayed great motivation and determination to return to PLOF. ?  ? ? ?Therapy/Group: Individual Therapy ? ?Hermina Barters ?09/18/2021, 12:20 PM ?

## 2021-09-18 NOTE — Progress Notes (Signed)
Physical Therapy Session Note ? ?Patient Details  ?Name: Mark Herrera ?MRN: [id number] ?Date of Birth: 1960/09/28 ? ?Today's Date: 09/18/2021 ?PT Individual Time: [phone] ?PT Individual Time Calculation (min): 57 min  ? ?Short Term Goals: ?Week 1:  PT Short Term Goal 1 (Week 1): Pt will complete bed<>chair transfers with CGA and LRAD ?PT Short Term Goal 2 (Week 1): Pt will ambulate 117ft with CGA and LRAD ?PT Short Term Goal 3 (Week 1): Pt will complete outcome measure to assess falls risk ? ? ?Skilled Therapeutic Interventions/Progress Updates:  ?Pt received lying in bed and agreeable to therapy. No reports of pain. STS CGA without AD. Gait training initiated without AD at CGA level to therapy gym x150 ft with improved upright posture, step-through pattern, consistent L foot clearance; decreased safety awareness still noted with conversation or busy environment. Was able to stand without AD at supervision level intermittently without LOB while therapist donned MaxiSky harness. Pt performed variety of gait, standing dynamic balance, and proprioception activities while in MaxiSky (included below) at supervision-CGA level for safety d/t decreased safety awareness when pt is distracted. Overall, pt demonstrated LOB with forward tandem walking and "karaoke" stepping to the L d/t impaired SL balance and coordination of the LLE. Therapist provided verbal cues intermittently to encourage upright posture for increasing proximal stability during balance challenges; utilized mirror for visual feedback of foot placement so pt could look up/ahead but d/c due to pt still having to look towards the ground. MaxiSky tasks included: ?- walking forward/backward ?- karoake stepping (difficulty d/t balance deficits) ?- side stepping ?- tandem walking forward (difficulty d/t balance deficits) ?- step-taps with colored dots on a 4" step while incorporating cognitive dual-tasking challenge with SL balance and coordination; required  occasional verbal correction for which foot to tap with d/t cognitive processing challenges, requiring increased time to perform, delayed recovery of balance consistently when in SLS on the LLE, decreased accuracy in tapping target with the L foot. ? ?Pt ambulated back to his room without AD at [facility]. Pt returned to sitting in bed with safety alarm active and all needs in reach. ? ?Therapy Documentation ?Precautions:  ?Precautions ?Precautions: Fall ?Precaution Comments: L hemi ?Restrictions ?Weight Bearing Restrictions: No ? ?Therapy/Group: Individual Therapy ? ?Charmian Muff ?09/18/2021, 12:12 PM  ?

## 2021-09-19 ENCOUNTER — Other Ambulatory Visit (HOSPITAL_COMMUNITY): Payer: Self-pay

## 2021-09-19 MED ORDER — NICOTINE 7 MG/24HR TD PT24
MEDICATED_PATCH | TRANSDERMAL | 0 refills | Status: DC
  Filled 2021-09-19: qty 28, fill #0

## 2021-09-19 MED ORDER — ACETAMINOPHEN 325 MG PO TABS: 650.0000 mg | ORAL_TABLET | ORAL | Status: DC | PRN

## 2021-09-19 MED ORDER — SENNOSIDES-DOCUSATE SODIUM 8.6-50 MG PO TABS: 1.0000 | ORAL_TABLET | Freq: Every day | ORAL | Status: DC

## 2021-09-19 MED ORDER — CLOPIDOGREL BISULFATE 75 MG PO TABS
75.0000 mg | ORAL_TABLET | Freq: Every day | ORAL | 2 refills | Status: AC
  Filled 2021-09-19: qty 30, 30d supply, fill #0

## 2021-09-19 MED ORDER — CITALOPRAM HYDROBROMIDE 20 MG PO TABS
20.0000 mg | ORAL_TABLET | Freq: Every day | ORAL | 0 refills | Status: DC
  Filled 2021-09-19: qty 30, 30d supply, fill #0

## 2021-09-19 MED ORDER — ATORVASTATIN CALCIUM 40 MG PO TABS
40.0000 mg | ORAL_TABLET | Freq: Every day | ORAL | 0 refills | Status: DC
  Filled 2021-09-19: qty 30, 30d supply, fill #0

## 2021-09-19 NOTE — Progress Notes (Signed)
Physical Therapy Session Note ? ?Patient Details  ?Name: Mark Herrera ?MRN: [id number] ?Date of Birth: 1960/09/12 ? ?Today's Date: 09/19/2021 ?PT Individual Time: [id number] ?PT Therapy Time: 35 min ? ?Short Term Goals: ?Week 1:  PT Short Term Goal 1 (Week 1): Pt will complete bed<>chair transfers with CGA and LRAD ?PT Short Term Goal 2 (Week 1): Pt will ambulate 124ft with CGA and LRAD ?PT Short Term Goal 3 (Week 1): Pt will complete outcome measure to assess falls risk ? ?Skilled Therapeutic Interventions/Progress Updates:  ?Therapy session initiated as a make-up session. Pt was received asleep in bed but easily woken and agreeable to therapy. No pain reported. Gross LE strength and sensation assessment performed at start of session to complete discharge evaluation. Gait training initiated without AD at supervision level x1000+ ft indoors in controlled environment and continued outside on hospital grounds with 3 intermittent seated rest breaks. Ambulation occurred with navigation of uneven surfaces, turns, and stairs in a busy environment to work on community re-integration and maintaining pt's safety awareness and attention to minimize fall risk. Pt navigated multiple flights of stairs intermittently with R HR supervision and alternating step pattern; climbed a max of 4 flights x8 steps to get from ground floor to 2nd floor inside hospital without requiring a break. Pt demonstrates improved awareness of how rest breaks can be utilized to support his endurance levels and minimize fall risk. Upon returning to his room, pt reports needing to have a bowel movement. Supervision for transferring to toilet; pt states he would alert nursing when finished.   ? ?Therapy Documentation ?Precautions:  ?Precautions ?Precautions: Fall ?Precaution Comments: L hemi ?Restrictions ?Weight Bearing Restrictions: No ? ? ?Therapy/Group: Individual Therapy ? ?Lanetta Inch ?09/19/2021, 3:23 PM  ?

## 2021-09-19 NOTE — Progress Notes (Signed)
Occupational Therapy Session Note ? ?Patient Details  ?Name: Mark Herrera ?MRN: [id number] ?Date of Birth: [birth year] ? ?Today's Date: 09/19/2021 ?OT Individual Time: 7622-6333 ?OT Individual Time Calculation (min): 75 min  ? ? ?Short Term Goals: ?Week 2:  OT Short Term Goal 1 (Week 2): STG=LTG 2/2 ELOS ? ?Skilled Therapeutic Interventions/Progress Updates:  ?  Pt eating breakfast EOB upon arrival. Pt amb with RW to bathroom with supervision and tranfserred to shower. Bathing at shower level with supervision. Dressing with supervision sit<>stand from EOB. Grooming standing at sink with supervision. Pt amb to day room and completed BLE/BUE there on NuStep-5 mins level 7 BLE/BUE and 5 mins level 5 BLE only. Pt amb without AD while holding large therapy ball with focus on balance and attention to task. Pt also amb without AD in hallway with focus on increased gait speed. Pt requires CGA when amb without AD. Pt returned to room and bed. Pt remained in bed with all needs withn reach and bed alarm activated.  ? ?Therapy Documentation ?Precautions:  ?Precautions ?Precautions: Fall ?Precaution Comments: L hemi ?Restrictions ?Weight Bearing Restrictions: No ? ?Pain: ? Pt denies pain this morning ? ? ?Therapy/Group: Individual Therapy ? ?Rich Brave ?09/19/2021, 8:22 AM ?

## 2021-09-19 NOTE — Progress Notes (Signed)
Occupational Therapy Session Note ? ?Patient Details  ?Name: Mark Herrera ?MRN: [id number] ?Date of Birth: [birth year] ? ?Today's Date: 09/19/2021 ?OT Individual Time: 9563-8756 ?OT Individual Time Calculation (min): 43 min  ? ? ?Short Term Goals: ?Week 2:  OT Short Term Goal 1 (Week 2): STG=LTG 2/2 ELOS ? ?Skilled Therapeutic Interventions/Progress Updates:  ?  OT intervention with focus on amb without AD in community setting, safety awareness, attention to task, and activity tolerance. Pt amb to [facility] entrance before resting. Pt amb without AD to [facility] entrance with rest break X 1. Pt amb while holding RW in LUE from [employer] to room. All amb with CGA and min verbal cues for attention to task. Pt retunred to bed and remained in bed with all needs within reach. Bed alarm activated.  ? ?Therapy Documentation ?Precautions:  ?Precautions ?Precautions: Fall ?Precaution Comments: L hemi ?Restrictions ?Weight Bearing Restrictions: No ? ?Pain: ? Pt denies pain ? ? ?Therapy/Group: Individual Therapy ? ?Rich Brave ?09/19/2021, 11:58 AM ?

## 2021-09-19 NOTE — Progress Notes (Signed)
Physical Therapy Session Note ? ?Patient Details  ?Name: Mark Herrera ?MRN: [id number] ?Date of Birth: [birth year] ? ?Today's Date: 09/19/2021 ?PT Individual Time: 2952-8413 ?PT Therapy Time: 85 min ? ?Short Term Goals: ?Week 1:  PT Short Term Goal 1 (Week 1): Pt will complete bed<>chair transfers with CGA and LRAD ?PT Short Term Goal 2 (Week 1): Pt will ambulate 171ft with CGA and LRAD ?PT Short Term Goal 3 (Week 1): Pt will complete outcome measure to assess falls risk  ? ?Skilled Therapeutic Interventions/Progress Updates:  ?Therapy session initiated as final discharge evaluation/summary. Pt was received lying in bed and agreeable to therapy. Gait training was initiated without an AD at supervision level while ambulating in controlled environment for 200+ ft; pt demonstrates improved L foot clearance but still with inconsistency, clearance decreased when distracted. Gait training continued outside on hospital grounds for 1000+ ft without an AD at [facility] for safety while navigating multiple sets of 12+ stairs with 1HR (step-through pattern), decline surface, uneven surfaces, turns, and maintaining awareness and focus on safety in all environments. Pt required verbal cuing to attend to foot placement more carefully when walking down decline surface d/t demonstrating an slightly ataxic gait pattern when distracted; minimal LOB and improved endurance noted during outside ambulation. Pt also required verbal cuing to attend to L foot placement during stairs to ensure foot is fully on the step when ascending.  ? ?Car transfers and bed <> chair transfers without AD performed at supervision level for safety. Outcome measures (TUG and 5x STS) initiated to assess falls risk. TUG: 13 sec (average of 3 trials) without AD at supervision; pt demonstrated mild LOB and inattention to obstacle when turning 180 deg to the L around the cone, but demonstrated improved avoidance of cone when turning to his R. 5x STS without UE support:  14.9 sec; pt demonstrated mild LOB with 1st attempt d/t excessive trunk flexion, but improved balance afterwards with verbal cuing for full hip and knee extension. Pt ambulated back to room without AD at supervision level. Pt was left in bed with safety alarm active and all needs in reach. ? ?Therapy Documentation ?Precautions:  ?Precautions ?Precautions: Fall ?Precaution Comments: L hemi ?Restrictions ?Weight Bearing Restrictions: No ? ? ? ?Therapy/Group: Individual Therapy ? ?Charmian Muff ?09/19/2021, 3:37 PM  ?

## 2021-09-19 NOTE — Progress Notes (Addendum)
Inpatient Rehabilitation Discharge Medication Review by a Pharmacist ? ?A complete drug regimen review was completed for this patient to identify any potential clinically significant medication issues. ? ?High Risk Drug Classes Is patient taking? Indication by Medication  ?Antipsychotic No   ?Anticoagulant No   ?Antibiotic No   ?Opioid No   ?Antiplatelet Yes ASA, plavix - CVA (continue for 90 days per neurology) End date of DAPT: 12/06/2021  ?Hypoglycemics/insulin No   ?Vasoactive Medication No   ?Chemotherapy No   ?Other Yes Lipitor - HLD ?Celexa - situational depression ?Nicoderm-CQ - smoking cessation ?Senokot-S - constipation  ? ? ? ?Type of Medication Issue Identified Description of Issue Recommendation(s)  ?Drug Interaction(s) (clinically significant) ?    ?Duplicate Therapy ?    ?Allergy ?    ?No Medication Administration End Date ?    ?Incorrect Dose ?    ?Additional Drug Therapy Needed ?    ?Significant med changes from prior encounter (inform family/care partners about these prior to discharge).    ?Other ? DAPT therapy Patient will continue for 90 days (end date: 12/06/2021). He will transition to aspirin 81 mg daily, thereafter (starting 12/07/2021)  ? ? ?Clinically significant medication issues were identified that warrant physician communication and completion of prescribed/recommended actions by midnight of the next day:  No ? ?Time spent performing this drug regimen review (minutes):  30 ? ? ?Dani Anastasovites BS ?Pharmacy Student ?09/19/2021 11:01 AM ? ?"When you reach the end of your rope, tie a not in it and hang on" - Franklin D. Roosevelt ? ?Agree with the contents of the above note ? ?Bonny Egger BS, PharmD, BCPS ?Clinical Pharmacist ?09/19/2021 11:53 AM ? ?Contact: [phone] after 3 PM ? ?"Be curious, not judgmental..." -Debbora Dus ?

## 2021-09-19 NOTE — Progress Notes (Signed)
Patient ID: HABIB KISE, male   DOB: March 09, 1961, 61 y.o.   MRN: [id number] ? ?Pt already set up for [facility]. SW ordered RW with [employer] (charity) ? ?SW made efforts to meet with pt to discuss discharge, however, pt sleeping with blanket over his face. SW will make efforts to discuss prior to d/c.  ? ?SW spoke with pt son [name] [phone]) to discuss pt discharge. SW shared about charity RW ordered and to send in financial assistance application within 30 days. SW also discussed outpatient therapies will require payment arrangement. He asked about pt disability. SW informed unsure on status as last update prior to vacation was pt was to be screened. SW shared will inform once there is an update.  ? ?SW emailed Joanne/[employer] to check status of Medicaid and disability screen and waiting on update. ? ?Cecile Sheerer, MSW, LCSWA ?Office: [phone] ?Cell: [phone] ?Fax: [phone]  ?

## 2021-09-19 NOTE — Progress Notes (Signed)
Physical Therapy Discharge Summary ? ?Patient Details  ?Name: Mark Herrera ?MRN: [id number] ?Date of Birth: 1961/03/07 ? ?Today's Date: 09/19/2021 ? ?Patient has met 8 of 8 long term goals due to improved activity tolerance, improved balance, improved postural control, increased strength, improved attention, improved awareness, and improved coordination.  Patient to discharge at an ambulatory level Supervision.   Patient's care partner is his girlfriend who was not able to come in for hands-on family education due to personal scheduling conflicts but she is independent and able to provide the necessary physical assistance at discharge.  ? ?Reasons goals not met: N/A ? ?Recommendation:  ?Patient will benefit from ongoing skilled PT services in outpatient setting to continue to advance safe functional mobility, address ongoing impairments in balance, coordination, endurance, safety awareness, and minimize fall risk. ? ?Equipment: ?Rolling walker ? ?Reasons for discharge: treatment goals met and discharge from hospital ? ?Patient/family agrees with progress made and goals achieved: Yes ? ?PT Discharge ?Precautions/Restrictions ?Precautions ?Precautions: Fall ?Precaution Comments: L hemi ?Restrictions ?Weight Bearing Restrictions: No ?Vital Signs ?Therapy Vitals ?Temp: 97.6 ?F (36.4 ?C) ?Pulse Rate: 62 ?Resp: 18 ?BP: 113/67 ?Patient Position (if appropriate): Lying ?Oxygen Therapy ?SpO2: 100 % ?O2 Device: Room Air ?Pain ?  ?Pain Interference ?Pain Interference ?Pain Effect on Sleep: 1. Rarely or not at all ?Pain Interference with Therapy Activities: 1. Rarely or not at all ?Pain Interference with Day-to-Day Activities: 1. Rarely or not at all ?Vision/Perception  ?Vision - History ?Baseline Vision: No visual deficits ?Patient Visual Report: Other (comment) (Changes from baseline; L homonymous hemianopsia) ?Praxis ?Praxis: Intact  ?Cognition ?Overall Cognitive Status: Within Functional Limits for tasks  assessed ?Arousal/Alertness: Awake/alert ?Orientation Level: Oriented X4 ?Year: 2023 ?Attention: Sustained;Alternating;Selective ?Focused Attention: Appears intact ?Sustained Attention: Appears intact ?Selective Attention: Appears intact ?Alternating Attention: Appears intact ?Memory: Appears intact ?Safety/Judgment: Impaired ?Comments: easily distractible in conversation or environment leading to decreased safety awareness ?Sensation ?Sensation ?Light Touch: Appears Intact ?Proprioception: Appears Intact ?Coordination ?Coordination and Movement Description: L hemi ?Finger Nose Finger Test: Impaired on LUE, WFL but still slow on RUE ?Motor  ?Motor ?Motor: Hemiplegia ?Motor - Skilled Clinical Observations: L hemi ?Motor - Discharge Observations: LLE balance, coordination, and proprioceptive deficits compared to RLE  ?Mobility ?Bed Mobility ?Bed Mobility: Supine to Sit;Sitting - Scoot to Edge of Bed;Sit to Supine ?Supine to Sit: Supervision/Verbal cueing ?Sitting - Scoot to Edge of Bed: Supervision/Verbal cueing ?Sit to Supine: Supervision/Verbal cueing ?Transfers ?Transfers: Sit to [employer] Transfers ?Sit to Stand: Supervision/Verbal cueing ?Stand Pivot Transfers: Supervision/Verbal cueing ?Stand Pivot Transfer Details: Verbal cues for precautions/safety ?Stand Pivot Transfer Details (indicate cue type and reason): occasional cuing if distracted ?Transfer (Assistive device): None ?Locomotion  ?Gait ?Ambulation: Yes ?Gait Assistance: Supervision/Verbal cueing ?Gait Distance (Feet): 200 Feet ?Assistive device: None ?Gait Assistance Details: Verbal cues for precautions/safety ?Gait Assistance Details: Verbal cuing for attending to foot placement and widening BOS on decline surface, making sure L foot is fully on the step when navigating stairs ?Gait ?Gait: Yes ?Gait Pattern: Impaired ?Gait Pattern: Decreased step length - right;Step-through pattern;Decreased stance time - left;Trunk flexed;Narrow base of  support;Poor foot clearance - left ?Gait velocity: decreased ?Stairs / Additional Locomotion ?Stairs: Yes ?Stairs Assistance: Supervision/Verbal cueing ?Stair Management Technique: One rail Right;Alternating pattern ?Number of Stairs: 12 ?Height of Stairs: 6 ?Pick up small object from the floor assist level: Contact Guard/Touching assist  ?Trunk/Postural Assessment  ?Cervical Assessment ?Cervical Assessment: Within Functional Limits ?Thoracic Assessment ?Thoracic Assessment: Within Functional Limits ?Lumbar Assessment ?Lumbar  Assessment: Within Functional Limits ?Postural Control ?Postural Control: Within Functional Limits  ?Balance ?Balance ?Balance Assessed: Yes ?Standardized Balance Assessment ?Standardized Balance Assessment: Timed Up and Go Test ?Timed Up and Go Test ?TUG: Normal TUG ?Normal TUG (seconds): 13 (no AD, supervision level) ?Static Sitting Balance ?Static Sitting - Balance Support: Feet unsupported;No upper extremity supported ?Static Sitting - Level of Assistance: 7: Independent ?Dynamic Sitting Balance ?Dynamic Sitting - Balance Support: During functional activity;Feet supported;No upper extremity supported ?Dynamic Sitting - Level of Assistance: 6: Modified independent (Device/Increase time) ?Static Standing Balance ?Static Standing - Balance Support: During functional activity;No upper extremity supported ?Static Standing - Level of Assistance: 5: Stand by assistance ?Extremity Assessment  ?  ?  ?RLE Assessment ?RLE Assessment: Within Functional Limits ?LLE Assessment ?LLE Assessment: Exceptions to [facility] ?LLE Strength ?Left Hip Flexion: 4/5 ?Left Knee Flexion: 5/5 ?Left Knee Extension: 5/5 ?Left Ankle Dorsiflexion: 4/5 ? ? ? ?Lanetta Inch ?09/19/2021, 3:31 PM ?

## 2021-09-19 NOTE — Progress Notes (Signed)
?                                                       PROGRESS NOTE ? ? ?Subjective/Complaints: ? ?LBM last night- no issues- spoke with OTA- pt's main issue is impulsivity- pt thinks is chronic ?ROS:  ? ? ?Pt denies SOB, abd pain, CP, N/V/C/D, and vision changes ? ? ?Objective: ?  ?No results found. ?No results for input(s): WBC, HGB, HCT, PLT in the last 72 hours. ? ?No results for input(s): NA, K, CL, CO2, GLUCOSE, BUN, CREATININE, CALCIUM in the last 72 hours. ? ? ?Intake/Output Summary (Last 24 hours) at 09/19/2021 0838 ?Last data filed at 09/19/2021 0735 ?Gross per 24 hour  ?Intake 950 ml  ?Output 1375 ml  ?Net -425 ml  ?  ? ?  ? ?Physical Exam: ?Vital Signs ?Blood pressure (!) 124/91, pulse 79, temperature 97.9 ?F (36.6 ?C), temperature source Oral, resp. rate 18, height 5\' 9"  (1.753 m), weight 72.3 kg, SpO2 100 %. ? ? ? ? ?General: awake, alert, appropriate, standing at sink with OTA- NAD ?HENT: conjugate gaze; oropharynx moist ?CV: regular rate; no JVD ?Pulmonary: CTA B/L; no W/R/R- good air movement ?GI: soft, NT, ND, (+)BS ?Psychiatric: appropriate ?Neurological: Ox3- impulsive- standing at sink doing grooming ?Neuro/Musculoskeletal: Left homonymous hemianopsia.  ?Motor: 4/5 strength throughout left side. Mild dysmetria ? ?Assessment/Plan: ?1. Functional deficits which require 3+ hours per day of interdisciplinary therapy in a comprehensive inpatient rehab setting. ?Physiatrist is providing close team supervision and 24 hour management of active medical problems listed below. ?Physiatrist and rehab team continue to assess barriers to discharge/monitor patient progress toward functional and medical goals ? ?Care Tool: ? ?Bathing ?   ?Body parts bathed by patient: Right arm, Left arm, Chest, Abdomen, Front perineal area, Buttocks, Right upper leg, Left upper leg, Right lower leg, Left lower leg, Face  ? Body parts bathed by helper: Right lower leg, Left lower leg, Buttocks ?  ?  ?Bathing assist Assist  Level: Supervision/Verbal cueing ?  ?  ?Upper Body Dressing/Undressing ?Upper body dressing   ?What is the patient wearing?: Pull over shirt ?   ?Upper body assist Assist Level: Independent ?   ?Lower Body Dressing/Undressing ?Lower body dressing ? ? ?   ?What is the patient wearing?: Pants, Underwear/pull up ? ?  ? ?Lower body assist Assist for lower body dressing: Supervision/Verbal cueing ?   ? ?Toileting ?Toileting    ?Toileting assist Assist for toileting: Supervision/Verbal cueing ?  ?  ?Transfers ?Chair/bed transfer ? ?Transfers assist ?   ? ?Chair/bed transfer assist level: Contact Guard/Touching assist ?  ?  ?Locomotion ?Ambulation ? ? ?Ambulation assist ? ?   ? ?Assist level: Contact Guard/Touching assist ?Assistive device: No Device ?Max distance: 142ft  ? ?Walk 10 feet activity ? ? ?Assist ?   ? ?Assist level: Contact Guard/Touching assist ?Assistive device: No Device  ? ?Walk 50 feet activity ? ? ?Assist   ? ?Assist level: Contact Guard/Touching assist ?Assistive device: No Device  ? ? ?Walk 150 feet activity ? ? ?Assist Walk 150 feet activity did not occur: Safety/medical concerns ? ?Assist level: Contact Guard/Touching assist ?Assistive device: No Device ?  ? ?Walk 10 feet on uneven surface  ?activity ? ? ?Assist Walk 10 feet on uneven surfaces activity did not  occur: Safety/medical concerns ? ? ?Assist level: Contact Guard/Touching assist ?Assistive device: Walker-rolling  ? ?Wheelchair ? ? ? ? ?Assist Is the patient using a wheelchair?: No ?  ?  ? ?  ?   ? ? ?Wheelchair 50 feet with 2 turns activity ? ? ? ?Assist ? ?  ?  ? ? ?   ? ?Wheelchair 150 feet activity  ? ? ? ?Assist ?   ? ? ?   ? ?Blood pressure (!) 124/91, pulse 79, temperature 97.9 ?F (36.6 ?C), temperature source Oral, resp. rate 18, height 5\' 9"  (1.753 m), weight 72.3 kg, SpO2 100 %. ? ?Medical Problem List and Plan: ?1. Functional deficits secondary to right MCA infarction ?         Continue CIR- PT, OT and SLP ? Con't CIR- PT, and OT_  SLP signed off- d/c tomorrow ?2. High grade intracranial stenosis: continue aspirin and plavix (will need total 90 days) and DVT prophylaxis with SCDs.  ?3. HLD: LDL is 105, continue atorvastatin 40mg  ?4. Situational depression: provide encouragement regarding improvements in sensation and strength already.  ? Continue Celexa 20 mg daily, started 4/11. ?5. Neuropsych: This patient is capable of making decisions on his own behalf. ?6. Impulsivity: SLP ordered ? 4/17- SLP discharged pt- doing so well ?7. Active smoker: continue smoking cessation counseling ? continue Nicotine patch 7 mcg daily, patient states it is helping ? 4/18- says no urge to smoke ?8. Active THC use: continue providing cessation counseling ?9. Active cocaine use: continue providing cessation counseling ? 4/17- educated pt on stopping illicit drug use.  ?10. History of prior R MCA stroke: provide stroke prevention counseling ?11. Hyperglycemia: HgbA1c is 5.3, educate regarding low added sugar in diet.  ?12. Hypoalbuminemia: educated regarding low sugar/high protein diet.  ?13. Urinary frequency/urgency/accidents-UC reviewed and shows insignificant growth.  ?14. Toothache: oragel ordered and helpful, continue PRN.  ?15. Bradycardic: continue to monitor HR TID ? 4/179- asymptomatic- con't to monitor  ? ? ?I spent a total of 35   minutes on total care today- >50% coordination of care- due to  discussing with pt about being impulsive and needing to ask for help when needed- not being "macho".  ? ?LOS: ?10 days ?A FACE TO FACE EVALUATION WAS PERFORMED ? ?Mark Herrera ?09/19/2021, 8:38 AM  ? ? ? ?

## 2021-09-19 NOTE — Progress Notes (Signed)
Occupational Therapy Discharge Summary ? ?Patient Details  ?Name: Mark Herrera ?MRN: [id number] ?Date of Birth: 03-May-1961 ? ?Patient has met 10 of 10 long term goals due to improved activity tolerance, improved balance, postural control, ability to compensate for deficits, functional use of  LEFT upper and LEFT lower extremity, improved attention, improved awareness, and improved coordination.  Pt made steady progress with BADLs and functional transfers. Pt with slight Lt inattention and easily distreacted. All BADLs with supervision. All tranfsers with supervsion. Pt verbalizes understanding of recommendation for 24 hour supervision.Patient to discharge at overall Supervision level.  Patient's care partner is independent to provide the necessary  supervision  assistance at discharge.   ? ?Reasons goals not met: n/a ? ?Recommendation:  ?Patient will benefit from ongoing skilled OT services in outpatient setting to continue to advance functional skills in the area of BADL, iADL, and Vocation, however pt does not have follow up coverage. ? ?Equipment: ?No equipment provided ? Pt will be purchasing shower chair on his own.  ? ?Reasons for discharge: treatment goals met and discharge from hospital ? ?Patient/family agrees with progress made and goals achieved: Yes ? ?OT Discharge ?ADL ?ADL ?Eating: Independent ?Where Assessed-Eating: Edge of bed ?Grooming: Independent, Modified independent ?Where Assessed-Grooming: Standing at sink ?Upper Body Bathing: Independent ?Where Assessed-Upper Body Bathing: Shower ?Lower Body Bathing: Supervision/safety ?Where Assessed-Lower Body Bathing: Shower ?Upper Body Dressing: Independent ?Where Assessed-Upper Body Dressing: Edge of bed ?Lower Body Dressing: Supervision/safety ?Where Assessed-Lower Body Dressing: Edge of bed ?Toileting: Supervision/safety ?Where Assessed-Toileting: Toilet ?Toilet Transfer: Distant supervision ?Toilet Transfer Method: Ambulating ?Community education officer: Grab bars ?Tub/Shower Transfer: Unable to assess ?Tub/Shower Transfer Method: Unable to assess ?Walk-In Shower Transfer: Distant supervision ?Walk-In Shower Transfer Method: Ambulating ?Walk-In Shower Equipment: Grab bars, Shower seat with back ?Vision ?Baseline Vision/History: 0 No visual deficits ?Patient Visual Report: No change from baseline ?Perception  ?Perception: Within Functional Limits ?Praxis ?Praxis: Intact ?Cognition ?Cognition ?Overall Cognitive Status: Within Functional Limits for tasks assessed ?Arousal/Alertness: Awake/alert ?Orientation Level: Person;Place;Situation ?Person: Oriented ?Place: Oriented ?Situation: Oriented ?Memory: Appears intact ?Attention: Sustained;Alternating;Selective ?Focused Attention: Appears intact ?Sustained Attention: Appears intact ?Selective Attention: Appears intact ?Awareness: Appears intact ?Problem Solving: Appears intact ?Safety/Judgment: Impaired ?Brief Interview for Mental Status (BIMS) ?Repetition of Three Words (First Attempt): 3 ?Temporal Orientation: Year: Correct ?Temporal Orientation: Month: Accurate within 5 days ?Recall: "Sock": Yes, no cue required ?Recall: "Blue": Yes, no cue required ?Recall: "Bed": Yes, no cue required ?BIMS Summary Score: 99 ?Sensation ?Sensation ?Light Touch: Appears Intact ?Hot/Cold: Appears Intact ?Proprioception: Appears Intact ?Stereognosis: Not tested ?Coordination ?Gross Motor Movements are Fluid and Coordinated: Yes ?Fine Motor Movements are Fluid and Coordinated: No ?Finger Nose Finger Test: Impaired on LUE, WFL but still slow on RUE ?Motor  ?Motor ?Motor: Hemiplegia ?Trunk/Postural Assessment  ?Cervical Assessment ?Cervical Assessment: Within Functional Limits ?Thoracic Assessment ?Thoracic Assessment: Within Functional Limits ?Lumbar Assessment ?Lumbar Assessment: Within Functional Limits ?Postural Control ?Postural Control: Within Functional Limits  ?Balance ?Static Sitting Balance ?Static Sitting - Balance  Support: Feet supported ?Static Sitting - Level of Assistance: 7: Independent ?Dynamic Sitting Balance ?Dynamic Sitting - Balance Support: During functional activity;Feet supported ?Dynamic Sitting - Level of Assistance: 6: Modified independent (Device/Increase time) ?Extremity/Trunk Assessment ?RUE Assessment ?RUE Assessment: Within Functional Limits ?LUE Assessment ?LUE Assessment: Exceptions to [facility] ?Passive Range of Motion (PROM) Comments: WNL ?Active Range of Motion (AROM) Comments: WNL but slow ?General Strength Comments: 4-/5 ?LUE Body System: Neuro ?Brunstrum levels for arm and hand: Arm;Hand ?Brunstrum level for arm:  Stage V Relative Independence from Synergy ?Brunstrum level for hand: Stage VI Isolated joint movements ? ? ?[name] ?09/19/2021, 8:20 AM ?

## 2021-09-20 NOTE — Progress Notes (Signed)
?                                                       PROGRESS NOTE ? ? ?Subjective/Complaints: ? ? ?Pt reports ready to d/c- asking about f/u- went over planned f/u with me- new PCP and Neuro- will get paperwork from PA.  ? ?Asking to walk around room mod I- explained cannot.  ?ROS:  ? ? ? ?Pt denies SOB, abd pain, CP, N/V/C/D, and vision changes ? ? ?Objective: ?  ?No results found. ?No results for input(s): WBC, HGB, HCT, PLT in the last 72 hours. ? ?No results for input(s): NA, K, CL, CO2, GLUCOSE, BUN, CREATININE, CALCIUM in the last 72 hours. ? ? ?Intake/Output Summary (Last 24 hours) at 09/20/2021 0858 ?Last data filed at 09/20/2021 0707 ?Gross per 24 hour  ?Intake 720 ml  ?Output 1600 ml  ?Net -880 ml  ?  ? ?  ? ?Physical Exam: ?Vital Signs ?Blood pressure 123/63, pulse (!) 54, temperature 97.7 ?F (36.5 ?C), temperature source Oral, resp. rate 15, height 5\' 9"  (1.753 m), weight 72.3 kg, SpO2 100 %. ? ? ? ? ? ?General: awake, alert, appropriate, sitting up in bed; NAD ?HENT: conjugate gaze; oropharynx moist ?CV: regular /bradycardic/borderline rate; no JVD ?Pulmonary: CTA B/L; no W/R/R- good air movement ?GI: soft, NT, ND, (+)BS ?Psychiatric: appropriate ?Neurological: Ox3- but still impulsive ?Neuro/Musculoskeletal: Left homonymous hemianopsia.  ?Motor: 4/5 strength throughout left side. Mild dysmetria ? ?Assessment/Plan: ?1. Functional deficits which require 3+ hours per day of interdisciplinary therapy in a comprehensive inpatient rehab setting. ?Physiatrist is providing close team supervision and 24 hour management of active medical problems listed below. ?Physiatrist and rehab team continue to assess barriers to discharge/monitor patient progress toward functional and medical goals ? ?Care Tool: ? ?Bathing ?   ?Body parts bathed by patient: Right arm, Left arm, Chest, Abdomen, Front perineal area, Buttocks, Right upper leg, Left upper leg, Right lower leg, Left lower leg, Face  ? Body parts bathed by  helper: Right lower leg, Left lower leg, Buttocks ?  ?  ?Bathing assist Assist Level: Supervision/Verbal cueing ?  ?  ?Upper Body Dressing/Undressing ?Upper body dressing   ?What is the patient wearing?: Pull over shirt ?   ?Upper body assist Assist Level: Independent ?   ?Lower Body Dressing/Undressing ?Lower body dressing ? ? ?   ?What is the patient wearing?: Pants, Underwear/pull up ? ?  ? ?Lower body assist Assist for lower body dressing: Supervision/Verbal cueing ?   ? ?Toileting ?Toileting    ?Toileting assist Assist for toileting: Supervision/Verbal cueing ?  ?  ?Transfers ?Chair/bed transfer ? ?Transfers assist ?   ? ?Chair/bed transfer assist level: Supervision/Verbal cueing ?  ?  ?Locomotion ?Ambulation ? ? ?Ambulation assist ? ?   ? ?Assist level: Supervision/Verbal cueing ?Assistive device: No Device ?Max distance: 300+ ft  ? ?Walk 10 feet activity ? ? ?Assist ?   ? ?Assist level: Supervision/Verbal cueing ?Assistive device: No Device  ? ?Walk 50 feet activity ? ? ?Assist   ? ?Assist level: Supervision/Verbal cueing ?Assistive device: No Device  ? ? ?Walk 150 feet activity ? ? ?Assist Walk 150 feet activity did not occur: Safety/medical concerns ? ?Assist level: Supervision/Verbal cueing ?Assistive device: No Device ?  ? ?Walk 10 feet on uneven surface  ?  activity ? ? ?Assist Walk 10 feet on uneven surfaces activity did not occur: Safety/medical concerns ? ? ?Assist level: Supervision/Verbal cueing ?Assistive device: Walker-rolling  ? ?Wheelchair ? ? ? ? ?Assist Is the patient using a wheelchair?: No ?  ?  ? ?  ?   ? ? ?Wheelchair 50 feet with 2 turns activity ? ? ? ?Assist ? ?  ?Wheelchair 50 feet with 2 turns activity did not occur: N/A ? ? ?   ? ?Wheelchair 150 feet activity  ? ? ? ?Assist ? Wheelchair 150 feet activity did not occur: N/A ? ? ?   ? ?Blood pressure 123/63, pulse (!) 54, temperature 97.7 ?F (36.5 ?C), temperature source Oral, resp. rate 15, height 5\' 9"  (1.753 m), weight 72.3 kg, SpO2  100 %. ? ?Medical Problem List and Plan: ?1. Functional deficits secondary to right MCA infarction ?          D/c today- will need f/u with Dr - hospital f/u and Neuro as well as PCP ?2. High grade intracranial stenosis: continue aspirin and plavix (will need total 90 days) and DVT prophylaxis with SCDs.  ?3. HLD: LDL is 105, continue atorvastatin 40mg  ?4. Situational depression: provide encouragement regarding improvements in sensation and strength already.  ? Continue Celexa 20 mg daily, started 4/11. ? 4/20- mood much better- con't Celexa ?5. Neuropsych: This patient is capable of making decisions on his own behalf. ?6. Impulsivity: SLP ordered ? 4/17- SLP discharged pt- doing so well ?7. Active smoker: continue smoking cessation counseling ? continue Nicotine patch 7 mcg daily, patient states it is helping ? 4/18- says no urge to smoke ?8. Active THC use: continue providing cessation counseling ?9. Active cocaine use: continue providing cessation counseling ? 4/17- educated pt on stopping illicit drug use.  ?10. History of prior R MCA stroke: provide stroke prevention counseling ?11. Hyperglycemia: HgbA1c is 5.3, educate regarding low added sugar in diet.  ?12. Hypoalbuminemia: educated regarding low sugar/high protein diet.  ?13. Urinary frequency/urgency/accidents-UC reviewed and shows insignificant growth.  ?14. Toothache: oragel ordered and helpful, continue PRN.  ?15. Bradycardic: continue to monitor HR TID ? 4/179- asymptomatic- con't to monitor  ? ? ? ? ?LOS: ?11 days ?A FACE TO FACE EVALUATION WAS PERFORMED ? ?Mark Herrera ?09/20/2021, 8:58 AM  ? ? ? ?

## 2021-09-20 NOTE — Progress Notes (Signed)
INPATIENT REHABILITATION DISCHARGE NOTE ° ° °Discharge instructions by: Dan, PA ° °Verbalized understanding: yes ° °Skin care/Wound care healing? none ° °Pain: none ° °IV's: none °  °Tubes/Drains: none  ° °O2: none ° °Safety instructions: reviewed with pt and family ° °Patient belongings: sent with pt ° °Discharged to: home ° °Discharged via: family transport ° °Notes: done ° °Kitti Mcclish W Dontavis Tschantz, RN ° ° ° ° ° ° ° °  °

## 2021-09-20 NOTE — Progress Notes (Signed)
Patient off the department -ground pass states with family ? ?2130 ?Patient found back in his room, upon speaking with patient, it was noted that his Nicotine patch had been removed that was placed on his right upper arm earlier in the morning. Patient states that he was unaware the patch was not there but shortly afterward the patch was found folded on the patient bed.and r reapplied. ? ?0600  ?Patient anticipates discharge today ?

## 2021-09-24 NOTE — Progress Notes (Signed)
Inpatient Rehabilitation Care Coordinator ?Discharge Note  ? ?Patient Details  ?Name: Mark Herrera ?MRN: [id number] ?Date of Birth: [birth year] ? ? ?Discharge location: D/c to home with son and support from his s/o ? ?Length of Stay: 10 days ? ?Discharge activity level: Supervision ? ?Home/community participation: Limited ? ?Patient response XN:ATFTDD Literacy - How often do you need to have someone help you when you read instructions, pamphlets, or other written material from your doctor or pharmacy?: Never ? ?Patient response UK:GURKYH Isolation - How often do you feel lonely or isolated from those around you?: Never ? ?Services provided included: MD, RD, PT, OT, CM, Pharmacy, Neuropsych, SW, TR, SLP, RN ? ?Financial Services:  ?Field seismologist Utilized: Other (Comment) (Self-pay) ?  ? ?Choices offered to/list presented to: Yes ? ?Follow-up services arranged:  ?Outpatient, DME ?   ?Outpatient Servicies: [facility] for outpatient PT/OT/SLP ?DME : [facility] for RW (charity) ?  ? ?Patient response to transportation need: ?Is the patient able to respond to transportation needs?: Yes ?In the past 12 months, has lack of transportation kept you from medical appointments or from getting medications?: No ?In the past 12 months, has lack of transportation kept you from meetings, work, or from getting things needed for daily living?: No ? ? ?Comments (or additional information): ? ?Patient/Family verbalized understanding of follow-up arrangements:  Yes ? ?Individual responsible for coordination of the follow-up plan: contact pt son [place] or s/o [place] ? ?Confirmed correct DME delivered: Mark Herrera 09/24/2021   ? ?Mark Herrera ?

## 2021-09-28 ENCOUNTER — Other Ambulatory Visit (HOSPITAL_COMMUNITY): Payer: Self-pay

## 2021-09-28 ENCOUNTER — Telehealth (HOSPITAL_COMMUNITY): Payer: Self-pay

## 2021-09-28 NOTE — Telephone Encounter (Signed)
Pharmacy Transitions of Care Follow-up Telephone Call ? ?Date of discharge: 09/20/21 ?Discharge Diagnosis: CVA ? ?How have you been since you were released from the hospital? Good  ? ?Medication changes made at discharge: ? - START: Plavix, Lipitor, ASA ? - STOPPED:  ? - CHANGED:  ? ?Medication changes verified by the patient? Yes ? ?Medication Accessibility: ? ?Home Pharmacy: [facility]  ? ?Was the patient provided with refills on discharged medications? Plavix ? ?Have all prescriptions been transferred from [facility] to home pharmacy? No  ? ?Is the patient able to afford medications? No ?Notable copays:  ?Eligible patient assistance: Pt has started process with assistance to apply for Medicaid and Disability ?  ? ?Medication Review: ? ?CLOPIDOGREL (PLAVIX) ?Clopidogrel 75 mg once daily.  ?- Educated patient on expected duration of therapy of 3 months with clopidogrel. Advised patient that aspirin will be continued indefinitely.  ?- Reviewed potential DDIs with patient  ?- Advised patient of medications to avoid (NSAIDs, ASA)  ?- Educated that Tylenol (acetaminophen) will be the preferred analgesic to prevent risk of bleeding  ?- Emphasized importance of monitoring for signs and symptoms of bleeding (abnormal bruising, prolonged bleeding, nose bleeds, bleeding from gums, discolored urine, black tarry stools)  ?- Advised patient to alert all providers of anticoagulation therapy prior to starting a new medication or having a procedure  ? ? ?Follow-up Appointments: ? ?PCP Hospital f/u appt confirmed? Yes Scheduled to see Neurology on May 10th ? ? ? ?If their condition worsens, is the pt aware to call PCP or go to the Emergency Dept.? yes ? ?Final Patient Assessment: ?Mark Herrera states he is taking his medications and does not have any questions at this time. He has received assistance to sign up for Medicaid and Disability. He is aware of his upcoming follow up appt. He is aware to avoid any otc pain relievers except Tylenol.   ? ?

## 2021-10-03 ENCOUNTER — Encounter: Payer: Self-pay | Attending: Registered Nurse | Admitting: Registered Nurse

## 2021-10-10 ENCOUNTER — Inpatient Hospital Stay: Payer: Medicaid Other | Admitting: Family Medicine

## 2021-10-23 ENCOUNTER — Encounter: Payer: Self-pay | Admitting: Registered Nurse

## 2021-12-19 ENCOUNTER — Other Ambulatory Visit: Payer: Self-pay

## 2021-12-19 NOTE — Patient Outreach (Signed)
[facility]) Care Management  12/19/2021  DAWIT TANKARD [birth year] [id number]   First telephone outreach attempt to obtain mRS. No answer. Left message for returned call.  [name] [facility] Management Assistant [phone]

## 2021-12-20 ENCOUNTER — Other Ambulatory Visit: Payer: Self-pay

## 2021-12-20 NOTE — Patient Outreach (Signed)
[facility]) Care Management  12/20/2021  CAMREN LIPSETT 19-Aug-1960 [id number]   Second telephone outreach attempt to obtain mRS. No answer. Unable to leave message for returned call.  [name] [facility] Management Assistant [phone]

## 2021-12-21 ENCOUNTER — Other Ambulatory Visit: Payer: Self-pay

## 2021-12-21 NOTE — Patient Outreach (Signed)
[facility]) Care Management  12/21/2021  Mark Herrera 18-Oct-1960 [id number]   3 outreach attempts were completed to obtain mRs. mRs could not be obtained because patient never returned my calls. mRs=7    [name] Care Management Assistant [phone]

## 2022-11-11 ENCOUNTER — Emergency Department (HOSPITAL_COMMUNITY): Payer: Medicaid Other

## 2022-11-11 ENCOUNTER — Encounter (HOSPITAL_COMMUNITY): Payer: Self-pay | Admitting: *Deleted

## 2022-11-11 ENCOUNTER — Emergency Department (HOSPITAL_COMMUNITY)
Admission: EM | Admit: 2022-11-11 | Discharge: 2022-11-11 | Disposition: A | Discharge status: Home / Self Care | Payer: Medicaid Other | Attending: Emergency Medicine | Admitting: Emergency Medicine

## 2022-11-11 ENCOUNTER — Other Ambulatory Visit: Payer: Self-pay

## 2022-11-11 DIAGNOSIS — Z7902 Long term (current) use of antithrombotics/antiplatelets: Secondary | ICD-10-CM | POA: Diagnosis not present

## 2022-11-11 DIAGNOSIS — Z7982 Long term (current) use of aspirin: Secondary | ICD-10-CM | POA: Diagnosis not present

## 2022-11-11 DIAGNOSIS — G43809 Other migraine, not intractable, without status migrainosus: Secondary | ICD-10-CM | POA: Diagnosis not present

## 2022-11-11 DIAGNOSIS — Z79899 Other long term (current) drug therapy: Secondary | ICD-10-CM | POA: Diagnosis not present

## 2022-11-11 DIAGNOSIS — I2510 Atherosclerotic heart disease of native coronary artery without angina pectoris: Secondary | ICD-10-CM | POA: Insufficient documentation

## 2022-11-11 DIAGNOSIS — Z8673 Personal history of transient ischemic attack (TIA), and cerebral infarction without residual deficits: Secondary | ICD-10-CM | POA: Diagnosis not present

## 2022-11-11 DIAGNOSIS — R519 Headache, unspecified: Secondary | ICD-10-CM | POA: Diagnosis present

## 2022-11-11 LAB — RAPID URINE DRUG SCREEN, HOSP PERFORMED
Amphetamines: NOT DETECTED
Barbiturates: NOT DETECTED
Benzodiazepines: NOT DETECTED
Cocaine: POSITIVE — AB
Opiates: NOT DETECTED
Tetrahydrocannabinol: POSITIVE — AB

## 2022-11-11 LAB — COMPREHENSIVE METABOLIC PANEL
ALT: 26 U/L (ref 0–44)
AST: 56 U/L — ABNORMAL HIGH (ref 15–41)
Albumin: 2.5 g/dL — ABNORMAL LOW (ref 3.5–5.0)
Alkaline Phosphatase: 64 U/L (ref 38–126)
Anion gap: 10 (ref 5–15)
BUN: 8 mg/dL (ref 6–20)
CO2: 24 mmol/L (ref 22–32)
Calcium: 8.2 mg/dL — ABNORMAL LOW (ref 8.9–10.3)
Chloride: 101 mmol/L (ref 98–111)
Creatinine, Ser: 1.02 mg/dL (ref 0.61–1.24)
GFR, Estimated: >60 mL/min (ref >60)
Glucose, Bld: 117 mg/dL — ABNORMAL HIGH (ref 70–99)
Potassium: 3.8 mmol/L (ref 3.5–5.1)
Sodium: 135 mmol/L (ref 135–145)
Total Bilirubin: 0.9 mg/dL (ref 0.3–1.2)
Total Protein: 6.3 g/dL — ABNORMAL LOW (ref 6.5–8.1)

## 2022-11-11 LAB — DIFFERENTIAL
Abs Immature Granulocytes: 0.06 10*3/uL (ref 0.00–0.07)
Basophils Absolute: 0 10*3/uL (ref 0.0–0.1)
Basophils Relative: 0 %
Eosinophils Absolute: 0.1 10*3/uL (ref 0.0–0.5)
Eosinophils Relative: 1 %
Immature Granulocytes: 0 %
Lymphocytes Relative: 6 %
Lymphs Abs: 0.8 10*3/uL (ref 0.7–4.0)
Monocytes Absolute: 1.1 10*3/uL — ABNORMAL HIGH (ref 0.1–1.0)
Monocytes Relative: 8 %
Neutro Abs: 11.6 10*3/uL — ABNORMAL HIGH (ref 1.7–7.7)
Neutrophils Relative %: 85 %

## 2022-11-11 LAB — I-STAT CHEM 8, ED
BUN: 8 mg/dL (ref 6–20)
Calcium, Ion: 1.13 mmol/L — ABNORMAL LOW (ref 1.15–1.40)
Chloride: 101 mmol/L (ref 98–111)
Creatinine, Ser: 1 mg/dL (ref 0.61–1.24)
Glucose, Bld: 117 mg/dL — ABNORMAL HIGH (ref 70–99)
HCT: 39 % (ref 39.0–52.0)
Hemoglobin: 13.3 g/dL (ref 13.0–17.0)
Potassium: 3.8 mmol/L (ref 3.5–5.1)
Sodium: 139 mmol/L (ref 135–145)
TCO2: 29 mmol/L (ref 22–32)

## 2022-11-11 LAB — CBC
HCT: 39.5 % (ref 39.0–52.0)
Hemoglobin: 12.5 g/dL — ABNORMAL LOW (ref 13.0–17.0)
MCH: 27.7 pg (ref 26.0–34.0)
MCHC: 31.6 g/dL (ref 30.0–36.0)
MCV: 87.6 fL (ref 80.0–100.0)
Platelets: 258 10*3/uL (ref 150–400)
RBC: 4.51 MIL/uL (ref 4.22–5.81)
RDW: 13.2 % (ref 11.5–15.5)
WBC: 13.7 10*3/uL — ABNORMAL HIGH (ref 4.0–10.5)
nRBC: 0 % (ref 0.0–0.2)

## 2022-11-11 LAB — PROTIME-INR
INR: 1.1 (ref 0.8–1.2)
Prothrombin Time: 14.4 seconds (ref 11.4–15.2)

## 2022-11-11 LAB — ETHANOL: Alcohol, Ethyl (B): <10 mg/dL (ref <10)

## 2022-11-11 LAB — APTT: aPTT: 31 seconds (ref 24–36)

## 2022-11-11 LAB — CBG MONITORING, ED: Glucose-Capillary: 112 mg/dL — ABNORMAL HIGH (ref 70–99)

## 2022-11-11 MED ORDER — SODIUM CHLORIDE 0.9% FLUSH: 3.0000 mL | Freq: Once | INTRAVENOUS | Status: DC

## 2022-11-11 MED ORDER — METOCLOPRAMIDE HCL 10 MG PO TABS: 10.0000 mg | ORAL_TABLET | Freq: Four times a day (QID) | ORAL | 0 refills | Status: DC

## 2022-11-11 MED ORDER — DIPHENHYDRAMINE HCL 50 MG/ML IJ SOLN
25.0000 mg | Freq: Once | INTRAMUSCULAR | Status: AC
  Administered 2022-11-11: 25 mg via INTRAVENOUS
  Filled 2022-11-11: qty 1

## 2022-11-11 MED ORDER — METOCLOPRAMIDE HCL 5 MG/ML IJ SOLN
10.0000 mg | Freq: Once | INTRAMUSCULAR | Status: AC
  Administered 2022-11-11: 10 mg via INTRAVENOUS
  Filled 2022-11-11: qty 2

## 2022-11-11 MED ORDER — ACETAMINOPHEN 500 MG PO TABS
1000.0000 mg | ORAL_TABLET | ORAL | Status: AC
  Administered 2022-11-11: 1000 mg via ORAL
  Filled 2022-11-11: qty 2

## 2022-11-11 NOTE — ED Notes (Signed)
Patient transported to CT 

## 2022-11-11 NOTE — Discharge Instructions (Signed)
You were seen for your headache in the emergency department.  You were given medicines which improved your symptoms.  At home, please take Tylenol and ibuprofen for your headache.  You may also take the Reglan we have prescribed you for your headache or any nausea or vomiting that you have.  You may take this with Benadryl for severe headaches but please note that this will make you drowsy.    Check your MyChart online for the results of any tests that had not resulted by the time you left the emergency department.   Follow-up with your primary doctor in 2-3 days regarding your visit.    Return immediately to the emergency department if you experience any of the following: Vision changes, numbness or weakness of your arms or legs, or any other concerning symptoms.    Thank you for visiting our Emergency Department. It was a pleasure taking care of you today.   

## 2022-11-11 NOTE — ED Notes (Signed)
Attempted to collect urine sample from pt. Pt states he is unable to provide sample at this time. 

## 2022-11-11 NOTE — ED Provider Notes (Signed)
EMERGENCY DEPARTMENT AT [facility] Provider Note   CSN: [id number] Arrival date & time: 11/11/22  1324     History  Chief Complaint  Patient presents with   Headache   Extremity Weakness    Mark Herrera is a 62 y.o. male.  62 year old male with history of CAD and right MCA stroke on aspirin and Plavix with residual left upper and left lower extremity weakness and polysubstance abuse who presents to the emergency department with headache and weakness.  Patient says that at approximately 6 PM last night he started noticing that his right leg was weak.  Also says that he had some new numbness to his left upper extremity.  Had gradual onset of a headache on the right side with photophobia.  No neck stiffness, fever, vomiting.  Not on blood thinners aside from aspirin and Plavix.  Is concerned about a stroke so he came in this morning to the emergency department.  Has residual left upper and left lower extremity weakness from his old stroke.       Home Medications Prior to Admission medications   Medication Sig Start Date End Date Taking? Authorizing Provider  metoCLOPramide (REGLAN) 10 MG tablet Take 1 tablet (10 mg total) by mouth every 6 (six) hours. 11/11/22  Yes Rondel Baton, MD  acetaminophen (TYLENOL) 325 MG tablet Take 2 tablets (650 mg total) by mouth every 4 (four) hours as needed for mild pain (or temp > 37.5 C (99.5 F)). 09/19/21   Angiulli, Mcarthur Rossetti, PA-C  atorvastatin (LIPITOR) 40 MG tablet Take 1 tablet (40 mg total) by mouth daily. 09/19/21   Angiulli, Mcarthur Rossetti, PA-C  citalopram (CELEXA) 20 MG tablet Take 1 tablet (20 mg total) by mouth daily. 09/19/21   Angiulli, Mcarthur Rossetti, PA-C  nicotine (NICODERM CQ - DOSED IN MG/24 HR) 7 mg/24hr patch Use one 7 mg patch daily 09/19/21   Angiulli, Mcarthur Rossetti, PA-C  senna-docusate (SENOKOT-S) 8.6-50 MG tablet Take 1 tablet by mouth daily with supper. 09/19/21   Angiulli, Mcarthur Rossetti, PA-C      Allergies    Patient  has no known allergies.    Review of Systems   Review of Systems  Physical Exam Updated Vital Signs BP 110/70   Pulse 60   Temp 98.6 F (37 C) (Oral)   Resp 15   Ht 5\' 9"  (1.753 m)   Wt 72.6 kg   SpO2 98%   BMI 23.63 kg/m  Physical Exam Vitals and nursing note reviewed.  Constitutional:      General: He is not in acute distress.    Appearance: He is well-developed.  HENT:     Head: Normocephalic and atraumatic.     Right Ear: External ear normal.     Left Ear: External ear normal.     Nose: Nose normal.  Eyes:     Extraocular Movements: Extraocular movements intact.     Conjunctiva/sclera: Conjunctivae normal.     Pupils: Pupils are equal, round, and reactive to light.     Comments: Pupils 4 mm bilaterally  Cardiovascular:     Rate and Rhythm: Normal rate and regular rhythm.     Heart sounds: Normal heart sounds.  Pulmonary:     Effort: Pulmonary effort is normal. No respiratory distress.     Breath sounds: Normal breath sounds.  Abdominal:     Palpations: Abdomen is soft.  Musculoskeletal:     Cervical back: Normal range of motion and neck  supple.     Right lower leg: No edema.     Left lower leg: No edema.  Skin:    General: Skin is warm and dry.  Neurological:     Mental Status: He is alert.     Comments: NIHSS Exam  Level of Consciousness: Alert  LOC Questions: Answers Month and Age Correctly  LOC Commands: Opens and Closes Eyes and Hands on command  Best Gaze: Horizontal ocular movements intact  Visual Fields: No visual field loss  Facial Palsy: None  L Upper Extremity Motor: Drift R Upper Extremity Motor: No drift after 10 seconds  L Lower extremity Motor: Drift R Lower extremity Motor: No drift after 5 seconds  Ataxia: Absent  Sensory: Intact sensation to light touch on face, arms, trunk, and legs bilaterally  Best Language: No aphasia  Dysarthria: No dysarthria  Neglect: No visual or sensory neglect    Psychiatric:        Mood and Affect: Mood  normal.        Behavior: Behavior normal.     ED Results / Procedures / Treatments   Labs (all labs ordered are listed, but only abnormal results are displayed) Labs Reviewed  CBC - Abnormal; Notable for the following components:      Result Value   WBC 13.7 (*)    Hemoglobin 12.5 (*)    All other components within normal limits  DIFFERENTIAL - Abnormal; Notable for the following components:   Neutro Abs 11.6 (*)    Monocytes Absolute 1.1 (*)    All other components within normal limits  COMPREHENSIVE METABOLIC PANEL - Abnormal; Notable for the following components:   Glucose, Bld 117 (*)    Calcium 8.2 (*)    Total Protein 6.3 (*)    Albumin 2.5 (*)    AST 56 (*)    All other components within normal limits  RAPID URINE DRUG SCREEN, HOSP PERFORMED - Abnormal; Notable for the following components:   Cocaine POSITIVE (*)    Tetrahydrocannabinol POSITIVE (*)    All other components within normal limits  I-STAT CHEM 8, ED - Abnormal; Notable for the following components:   Glucose, Bld 117 (*)    Calcium, Ion 1.13 (*)    All other components within normal limits  CBG MONITORING, ED - Abnormal; Notable for the following components:   Glucose-Capillary 112 (*)    All other components within normal limits  PROTIME-INR  APTT  ETHANOL    EKG EKG Interpretation  Date/Time:  Monday November 11 2022 09:21:36 EDT Ventricular Rate:  75 PR Interval:  140 QRS Duration: 84 QT Interval:  388 QTC Calculation: 433 R Axis:   81 Text Interpretation: Normal sinus rhythm Minimal voltage criteria for LVH, may be normal variant ( Sokolow-Lyon ) Septal infarct , age undetermined Abnormal ECG When compared with ECG of 05-Sep-2021 19:25, PREVIOUS ECG IS PRESENT Confirmed by Vonita Moss [phone]) on 11/11/2022 12:34:07 PM  Radiology MR BRAIN WO CONTRAST  Result Date: 11/11/2022 CLINICAL DATA:  Headache, neuro deficit. EXAM: MRI HEAD WITHOUT CONTRAST TECHNIQUE: Multiplanar, multiecho pulse  sequences of the brain and surrounding structures were obtained without intravenous contrast. COMPARISON:  Brain MRI 09/08/2021.  Head CT 11/11/2022. FINDINGS: Brain: No acute infarct or hemorrhage. Encephalomalacia along the right temporoparietal junction from prior right MCA territory infarct. No hydrocephalus or extra-axial collection. No mass or midline shift. Linear hemosiderin deposition along areas of prior infarct from prior cortical laminar necrosis. No other foci of abnormal susceptibility.  Vascular: Normal flow voids. Skull and upper cervical spine: Normal marrow signal. Upper cervical spondylosis with at least mild spinal canal stenosis at C3-4. Sinuses/Orbits: Unremarkable. Other: None. IMPRESSION: 1. No acute intracranial abnormality. 2. Old right MCA territory infarct. 3. Upper cervical spondylosis with at least mild spinal canal stenosis at C3-4. Electronically Signed   By: Orvan Falconer M.D.   On: 11/11/2022 12:35   CT HEAD WO CONTRAST  Result Date: 11/11/2022 CLINICAL DATA:  History of stroke.  Headache. EXAM: CT HEAD WITHOUT CONTRAST TECHNIQUE: Contiguous axial images were obtained from the base of the skull through the vertex without intravenous contrast. RADIATION DOSE REDUCTION: This exam was performed according to the departmental dose-optimization program which includes automated exposure control, adjustment of the mA and/or kV according to patient size and/or use of iterative reconstruction technique. COMPARISON:  Head CT 09/06/2021.  MRI brain 09/08/2021. FINDINGS: Brain: No acute hemorrhage. Encephalomalacia in the right temporoparietal junction from prior right MCA territory infarct. No new loss of gray-white differentiation. No hydrocephalus or extra-axial collection. No mass effect or midline shift. Vascular: No hyperdense vessel or unexpected calcification. Skull: No calvarial fracture or suspicious bone lesion. Skull base is unremarkable. Sinuses/Orbits: Unremarkable. Other:  None. IMPRESSION: 1. No acute intracranial abnormality. 2. Old right MCA territory infarct. Electronically Signed   By: Orvan Falconer M.D.   On: 11/11/2022 11:06    Procedures Procedures    Medications Ordered in ED Medications  metoCLOPramide (REGLAN) injection 10 mg (10 mg Intravenous Given 11/11/22 1105)  diphenhydrAMINE (BENADRYL) injection 25 mg (25 mg Intravenous Given 11/11/22 1105)  acetaminophen (TYLENOL) tablet 1,000 mg (1,000 mg Oral Given 11/11/22 1105)    ED Course/ Medical Decision Making/ A&P                             Medical Decision Making Amount and/or Complexity of Data Reviewed Labs: ordered. Radiology: ordered.  Risk OTC drugs. Prescription drug management.   Mark Herrera is a 62 y.o. male with comorbidities that complicate the patient evaluation including CAD and stroke with residual left-sided weakness and polysubstance abuse who presents to the emergency department with gradual onset headache as well as weakness that has been present since last night  Initial Ddx:  ICH, stroke, aneurysm/subarachnoid, meningitis, migraine  MDM/course:  Patient does not have any new objective neurologic deficits on exam.  Does report that his leg feels weaker and that he is having numbness of his left upper extremity so we will obtain a CT of the head and follow this with an MRI to ensure that he does not have an ICH or stroke.  Given the gradual onset of his headache as well as lack of any neck stiffness or vomiting no aneurysm or subarachnoid hemorrhage is highly unlikely.  No meningismus fever on my exam to suggest meningitis.  Patient underwent CT and MRI which were unremarkable.  He also had lab work done that was nonrevealing.  Upon my reevaluation after doing a migraine cocktail was feeling better.  Will have the patient follow-up with his primary doctor in several days and was given a prescription of Reglan to take for headache.  Upon arrival was initially standing  under the sheets and not wanting to talk to other individuals so urine drug screen and ethanol were sent off which were positive for THC and cocaine.  Counseled patient on cocaine cessation.  This patient presents to the ED for concern of complaints  listed in HPI, this involves an extensive number of treatment options, and is a complaint that carries with it a high risk of complications and morbidity. Disposition including potential need for admission considered.   Dispo: DC Home. Return precautions discussed including, but not limited to, those listed in the AVS. Allowed pt time to ask questions which were answered fully prior to dc.  Records reviewed Outpatient Clinic Notes The following labs were independently interpreted: Chemistry and show no acute abnormality I independently reviewed the following imaging with scope of interpretation limited to determining acute life threatening conditions related to emergency care: CT Head and agree with the radiologist interpretation with the following exceptions: none I personally reviewed and interpreted the pt's EKG: see above for interpretation  I have reviewed the patients home medications and made adjustments as needed Social Determinants of health:  Cocaine abuse         Final Clinical Impression(s) / ED Diagnoses Final diagnoses:  Other migraine without status migrainosus, not intractable  History of stroke    Rx / DC Orders ED Discharge Orders          Ordered    metoCLOPramide (REGLAN) 10 MG tablet  Every 6 hours        11/11/22 1542              Rondel Baton, MD 11/12/22 [phone]

## 2022-11-11 NOTE — ED Triage Notes (Signed)
C/o headache onset 2 days ago, worse today c/o nausea no vomiting. States his left arm feels numb.

## 2023-01-08 DIAGNOSIS — I6350 Cerebral infarction due to unspecified occlusion or stenosis of unspecified cerebral artery: Secondary | ICD-10-CM | POA: Insufficient documentation

## 2023-01-08 DIAGNOSIS — R531 Weakness: Secondary | ICD-10-CM | POA: Insufficient documentation

## 2023-02-12 ENCOUNTER — Other Ambulatory Visit: Payer: Self-pay

## 2023-02-12 ENCOUNTER — Emergency Department (HOSPITAL_COMMUNITY)
Admission: EM | Admit: 2023-02-12 | Discharge: 2023-02-12 | Disposition: A | Discharge status: Home / Self Care | Payer: Medicaid Other

## 2023-02-12 ENCOUNTER — Encounter (HOSPITAL_COMMUNITY): Payer: Self-pay

## 2023-02-12 DIAGNOSIS — L02415 Cutaneous abscess of right lower limb: Secondary | ICD-10-CM | POA: Diagnosis present

## 2023-02-12 DIAGNOSIS — L0291 Cutaneous abscess, unspecified: Secondary | ICD-10-CM

## 2023-02-12 MED ORDER — LIDOCAINE-EPINEPHRINE (PF) 2 %-1:200000 IJ SOLN
10.0000 mL | Freq: Once | INTRAMUSCULAR | Status: AC
  Administered 2023-02-12: 10 mL
  Filled 2023-02-12: qty 20

## 2023-02-12 MED ORDER — ACETAMINOPHEN 500 MG PO TABS
1000.0000 mg | ORAL_TABLET | Freq: Once | ORAL | Status: AC
  Administered 2023-02-12: 1000 mg via ORAL
  Filled 2023-02-12: qty 2

## 2023-02-12 MED ORDER — SULFAMETHOXAZOLE-TRIMETHOPRIM 800-160 MG PO TABS: 1.0000 | ORAL_TABLET | Freq: Two times a day (BID) | ORAL | 0 refills | Status: AC

## 2023-02-12 MED ORDER — OXYCODONE-ACETAMINOPHEN 5-325 MG PO TABS
1.0000 | ORAL_TABLET | Freq: Once | ORAL | Status: AC
  Administered 2023-02-12: 1 via ORAL
  Filled 2023-02-12: qty 1

## 2023-02-12 NOTE — ED Triage Notes (Signed)
Pt states that he noticed abscess on right thigh x 3 days ago. Pt denies drainage, but area surrounding is erythematous. Denies fever.

## 2023-02-12 NOTE — Discharge Instructions (Signed)
Take the antibiotics prescribed to you for the full course.  Please follow-up with your primary doctor.  Return immediately if develop any fevers, chills, worsening redness around wound that spreads up your legs.  Severe pain or any new or worsening symptoms that are concerning to you.  You may take over-the-counter Tylenol alternate with Motrin for pain.

## 2023-02-12 NOTE — ED Notes (Signed)
Patient verbalizes understanding of discharge instructions. Opportunity for questioning and answers were provided. Armband removed by staff, pt discharged from ED.  

## 2023-02-12 NOTE — ED Provider Notes (Signed)
Mark Herrera Provider Note   CSN: [id number] Arrival date & time: 02/12/23  7829     History  Chief Complaint  Patient presents with   Abscess    Mark Herrera is a 62 y.o. male.  This is a 62 year old male presenting emergency department for abscess to right leg.  Noticed increased redness and pain over the past 3 days.  No fevers no chills.  No drainage.  Reports that his girlfriend just had MRSA and he was concerned he had had an abscess.   Abscess      Home Medications Prior to Admission medications   Medication Sig Start Date End Date Taking? Authorizing Provider  sulfamethoxazole-trimethoprim (BACTRIM DS) 800-160 MG tablet Take 1 tablet by mouth 2 (two) times daily for 7 days. 02/12/23 02/19/23 Yes Jovontae Banko, Harmon Dun, DO  acetaminophen (TYLENOL) 325 MG tablet Take 2 tablets (650 mg total) by mouth every 4 (four) hours as needed for mild pain (or temp > 37.5 C (99.5 F)). 09/19/21   Angiulli, Mcarthur Rossetti, PA-C  atorvastatin (LIPITOR) 40 MG tablet Take 1 tablet (40 mg total) by mouth daily. 09/19/21   Angiulli, Mcarthur Rossetti, PA-C  citalopram (CELEXA) 20 MG tablet Take 1 tablet (20 mg total) by mouth daily. 09/19/21   Angiulli, Mcarthur Rossetti, PA-C  metoCLOPramide (REGLAN) 10 MG tablet Take 1 tablet (10 mg total) by mouth every 6 (six) hours. 11/11/22   Rondel Baton, MD  nicotine (NICODERM CQ - DOSED IN MG/24 HR) 7 mg/24hr patch Use one 7 mg patch daily 09/19/21   Angiulli, Mcarthur Rossetti, PA-C  senna-docusate (SENOKOT-S) 8.6-50 MG tablet Take 1 tablet by mouth daily with supper. 09/19/21   Angiulli, Mcarthur Rossetti, PA-C      Allergies    Patient has no known allergies.    Review of Systems   Review of Systems  Physical Exam Updated Vital Signs BP 137/89   Pulse 65   Temp 98.6 F (37 C) (Oral)   Resp 16   SpO2 100%  Physical Exam Vitals and nursing note reviewed.  Constitutional:      General: He is not in acute distress. HENT:     Head:  Normocephalic and atraumatic.     Nose: Nose normal.     Mouth/Throat:     Mouth: Mucous membranes are moist.  Eyes:     Conjunctiva/sclera: Conjunctivae normal.  Cardiovascular:     Rate and Rhythm: Normal rate and regular rhythm.  Pulmonary:     Effort: Pulmonary effort is normal.     Breath sounds: Normal breath sounds.  Abdominal:     General: Abdomen is flat. There is no distension.     Tenderness: There is no abdominal tenderness. There is no guarding or rebound.  Musculoskeletal:        General: Normal range of motion.     Comments: Patient with a 8 cm area of erythema smaller 5 cm area of induration and fluctuance, small pustule noted.  Skin:    General: Skin is warm and dry.     Capillary Refill: Capillary refill takes less than 2 seconds.  Neurological:     Mental Status: He is alert and oriented to person, place, and time.     Gait: Gait normal.  Psychiatric:        Mood and Affect: Mood normal.        Behavior: Behavior normal.     ED Results / Procedures / Treatments  Labs (all labs ordered are listed, but only abnormal results are displayed) Labs Reviewed - No data to display  EKG None  Radiology No results found.  Procedures .Marland KitchenIncision and Drainage  Date/Time: 02/12/2023 11:10 PM  Performed by: Coral Spikes, DO Authorized by: Coral Spikes, DO   Consent:    Consent obtained:  Verbal   Consent given by:  Patient   Risks discussed:  Bleeding, incomplete drainage, pain and infection   Alternatives discussed:  No treatment Universal protocol:    Patient identity confirmed:  Verbally with patient Location:    Type:  Abscess   Size:  5   Location:  Lower extremity   Lower extremity location:  Leg   Leg location:  R upper leg Pre-procedure details:    Skin preparation:  Chlorhexidine with alcohol Sedation:    Sedation type:  None Anesthesia:    Anesthesia method:  Local infiltration   Local anesthetic:  Lidocaine 2% WITH epi Procedure  type:    Complexity:  Simple Procedure details:    Ultrasound guidance: no     Needle aspiration: no     Incision types:  Stab incision   Incision depth:  Dermal   Wound management:  Probed and deloculated   Drainage:  Bloody and purulent   Drainage amount:  Moderate   Wound treatment:  Wound left open   Packing materials:  None Post-procedure details:    Procedure completion:  Tolerated well, no immediate complications     Medications Ordered in ED Medications  acetaminophen (TYLENOL) tablet 1,000 mg (1,000 mg Oral Given 02/12/23 2007)  oxyCODONE-acetaminophen (PERCOCET/ROXICET) 5-325 MG per tablet 1 tablet (1 tablet Oral Given 02/12/23 2156)  lidocaine-EPINEPHrine (XYLOCAINE W/EPI) 2 %-1:200000 (PF) injection 10 mL (10 mLs Infiltration Given 02/12/23 2156)    ED Course/ Medical Decision Making/ A&P                                 Medical Decision Making Is a well-appearing 62 year old male present to the emergency department with an abscess to his right thigh.  He is afebrile and vital signs are reassuring.  Exam consistent with abscess.  Verbally consented, incision and drainage performed with removal of pus.  Will discharge on antibiotics.  He will follow-up with his primary doctor.  Return precautions given.  Amount and/or Complexity of Data Reviewed Labs:     Details: Consider labs, however patient well-appearing and infection appears superficial.  Low suspicion for systemic infection.  Risk OTC drugs. Prescription drug management.          Final Clinical Impression(s) / ED Diagnoses Final diagnoses:  Abscess    Rx / DC Orders ED Discharge Orders          Ordered    sulfamethoxazole-trimethoprim (BACTRIM DS) 800-160 MG tablet  2 times daily        02/12/23 2313              Coral Spikes, DO 02/12/23 2316

## 2023-08-05 ENCOUNTER — Encounter (INDEPENDENT_AMBULATORY_CARE_PROVIDER_SITE_OTHER): Payer: Self-pay

## 2023-09-21 IMAGING — MR MR HEAD W/O CM
12 of 13 series · 44 of 48 positions shown · non-contrast
Comparison: Head CT and CTA from yesterday in the preceding day

CLINICAL DATA: Stroke workup

EXAM:
MRI HEAD WITHOUT CONTRAST
TECHNIQUE: Multiplanar, multiecho pulse sequences of the brain and surrounding
structures were obtained without intravenous contrast.

[Series 5: DWI · axial · 3.0mm · 0.88mm/px · z∈[-118,+26]mm · 8 of 100 slices shown (1 of 4)]
[im 1/100]
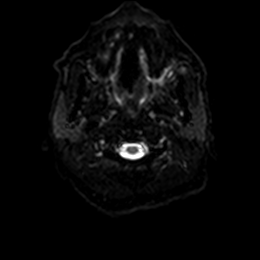
[im 15/100]
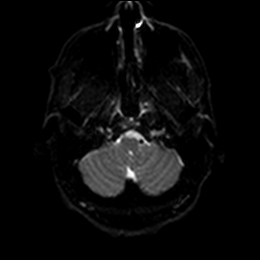
[im 29/100]
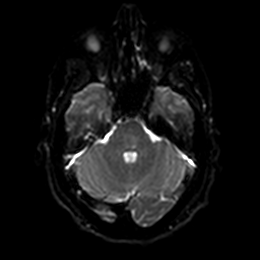
[im 43/100]
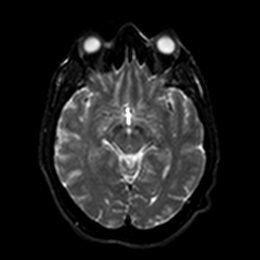
[im 57/100]
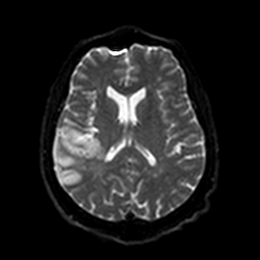
[im 71/100]
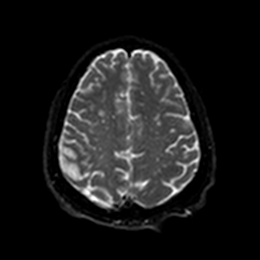
[im 85/100]
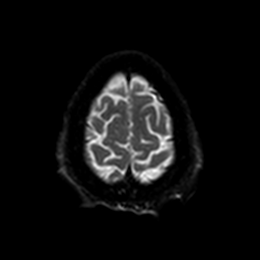
[im 100/100]
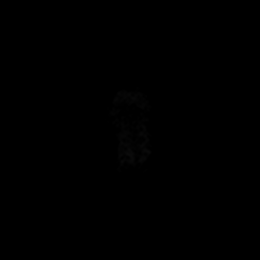

[Series 6: DWI · axial · 3.0mm · 0.88mm/px · z∈[-118,+26]mm · 4 of 50 slices shown (2 of 4)]
[im 1/50]
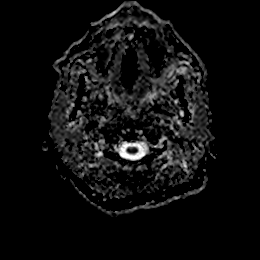
[im 17/50]
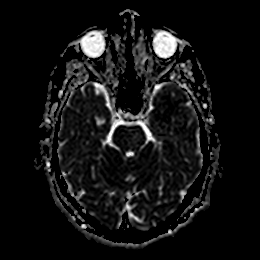
[im 33/50]
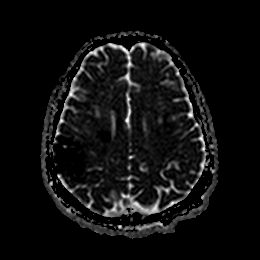
[im 50/50]
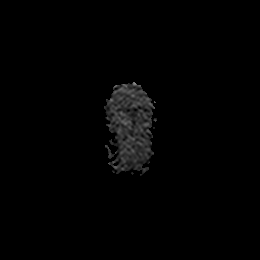

[Series 7: DWI · coronal · 4.0mm · 0.88mm/px · 6 of 74 slices shown (3 of 4)]
[im 1/74]
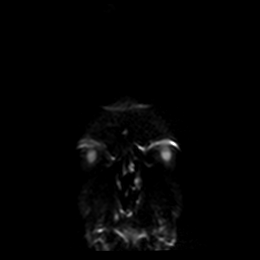
[im 15/74]
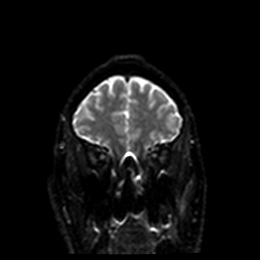
[im 30/74]
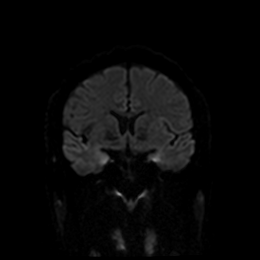
[im 44/74]
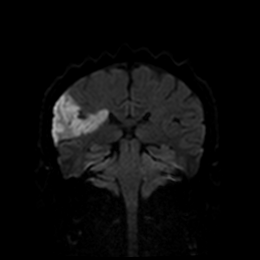
[im 59/74]
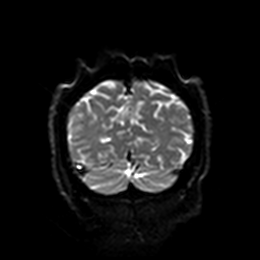
[im 74/74]
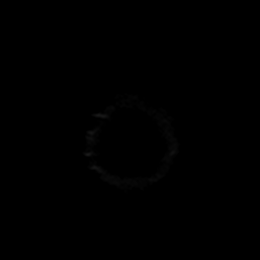

[Series 8: DWI · coronal · 4.0mm · 0.88mm/px · 3 of 37 slices shown (4 of 4)]
[im 1/37]
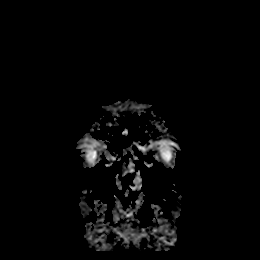
[im 19/37]
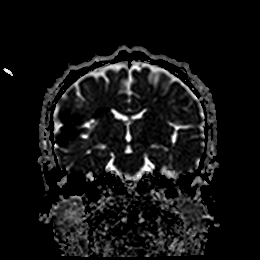
[im 37/37]
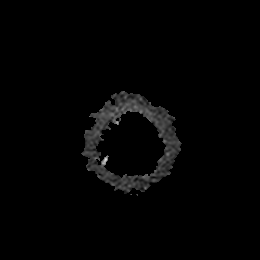

[Series 9: T1 · sagittal · 5.0mm · 0.75mm/px · 2 of 25 slices shown]
[im 1/25]
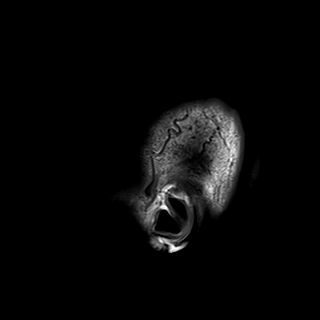
[im 25/25]
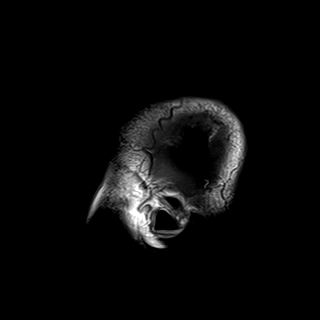

[Series 10: T2 · axial · 5.0mm · 0.72mm/px · z∈[-119,+27]mm · 2 of 26 slices shown (1 of 2)]
[im 1/26]
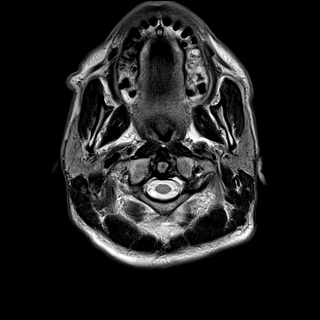
[im 26/26]
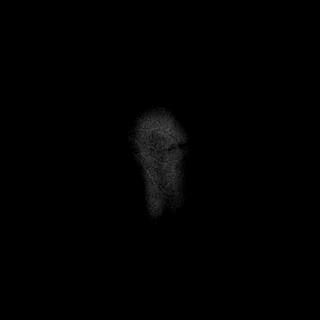

[Series 11: FLAIR · axial · 5.0mm · 0.45mm/px · z∈[-120,+27]mm · 2 of 26 slices shown]
[im 1/26]
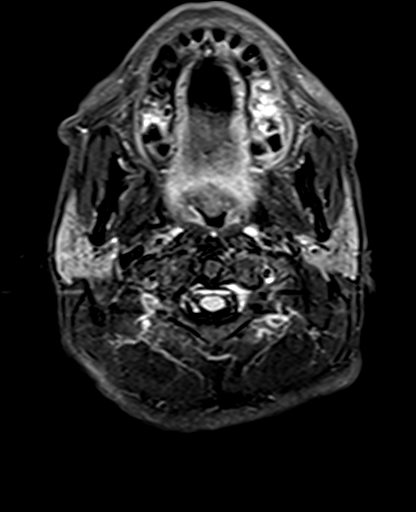
[im 26/26]
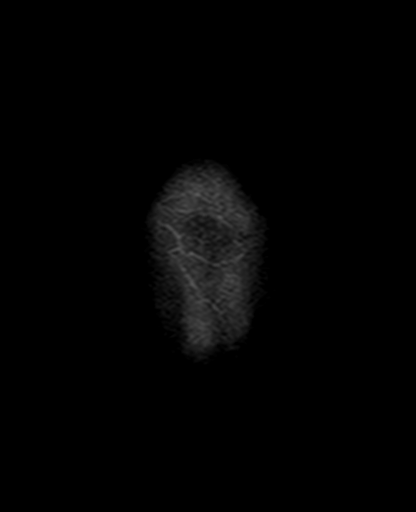

[Series 12: mag_images · axial · 3.0mm · 0.90mm/px · z∈[-121,+29]mm · 4 of 52 slices shown]
[im 1/52]
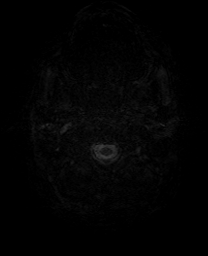
[im 18/52]
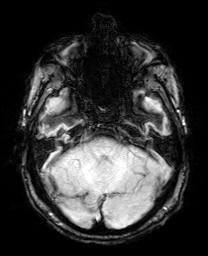
[im 35/52]
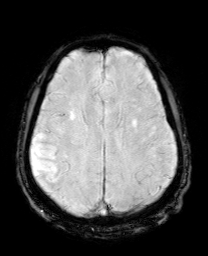
[im 52/52]
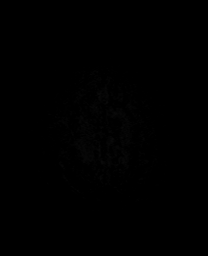

[Series 13: pha_images · axial · 3.0mm · 0.90mm/px · z∈[-121,+26]mm · 4 of 51 slices shown]
[im 1/51]
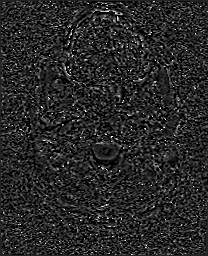
[im 17/51]
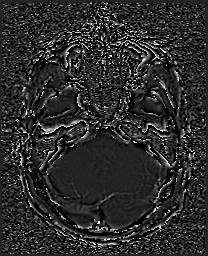
[im 34/51]
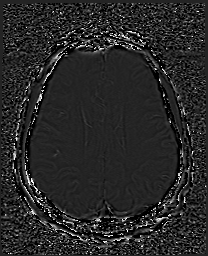
[im 51/51]
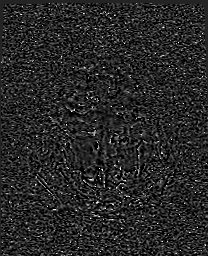

[Series 14: swi_images · axial · 3.0mm · 0.90mm/px · z∈[-121,+29]mm · 4 of 52 slices shown]
[im 1/52]
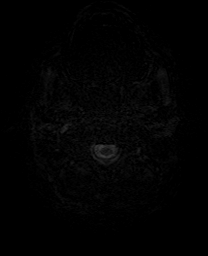
[im 18/52]
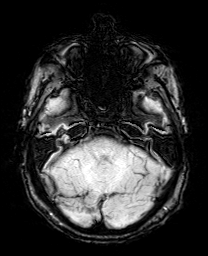
[im 35/52]
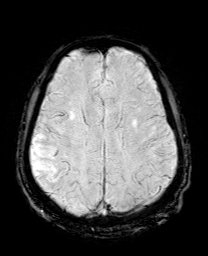
[im 52/52]
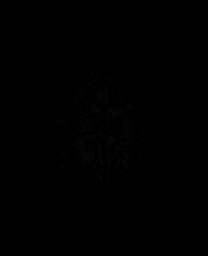

[Series 15: mip_images(sw) · axial · 24.0mm · 0.90mm/px · z∈[-111,+18]mm · 3 of 45 slices shown]
[im 1/45]
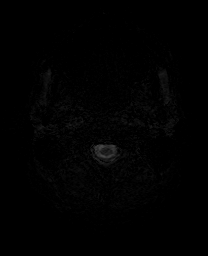
[im 23/45]
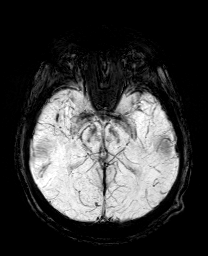
[im 45/45]
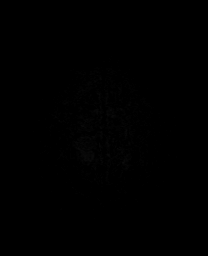

[Series 17: T2 · coronal · 5.0mm · 0.34mm/px · 2 of 31 slices shown (2 of 2)]
[im 1/31]
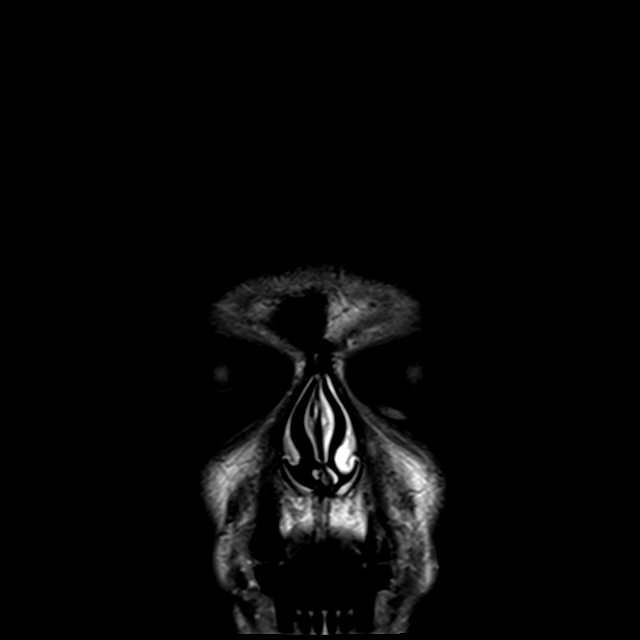
[im 31/31]
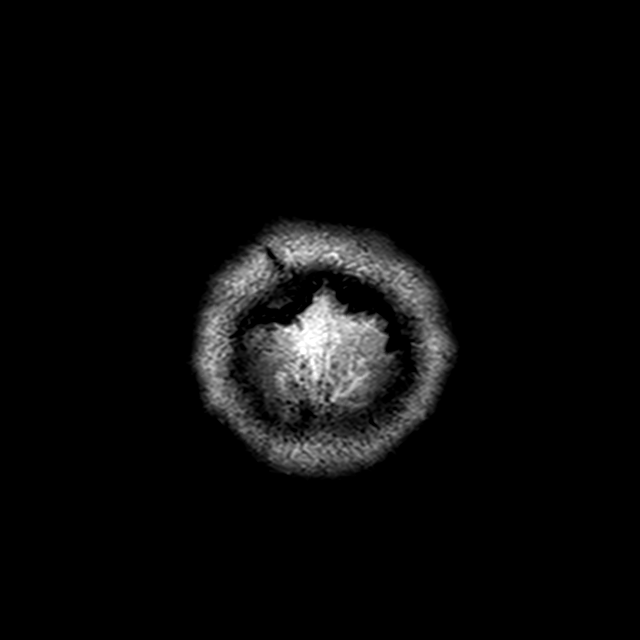

[44 of 48 positions shown; findings below may reference images not displayed]

FINDINGS: Brain: Acute right MCA branch infarct affecting the superficial
temporal lobe and extending into the right frontal and parietal
cortex, roughly following the sylvian fissure. Mild white matter
involvement extending towards the lateral ventricle. Pre-existing
right insular infarct. No hematoma, hydrocephalus, or collection.
Mild chronic small vessel ischemic change in the hemispheric white
matter and pons.

Vascular: Major flow voids are preserved.  Recent CTA

Skull and upper cervical spine: Normal marrow signal. C3-4 disc
degeneration.

Sinuses/Orbits: No significant finding
IMPRESSION: 1. Acute right MCA branch infarct as described. Pre-existing right
insular cortex infarct.
2. Chronic small vessel ischemia in the hemispheric white matter and
pons.

## 2023-09-26 ENCOUNTER — Inpatient Hospital Stay (HOSPITAL_COMMUNITY)
Admission: EM | Admit: 2023-09-26 | Discharge: 2023-09-28 | DRG: 024 | Disposition: A | Discharge status: Home / Self Care | Payer: MEDICAID | Attending: Neurology | Admitting: Neurology

## 2023-09-26 ENCOUNTER — Encounter (HOSPITAL_COMMUNITY): Payer: Self-pay | Admitting: Certified Registered Nurse Anesthetist

## 2023-09-26 ENCOUNTER — Encounter (HOSPITAL_COMMUNITY)
Admission: EM | Disposition: A | Discharge status: Home / Self Care | Payer: Self-pay | Source: Home / Self Care | Attending: Neurology

## 2023-09-26 ENCOUNTER — Inpatient Hospital Stay (HOSPITAL_COMMUNITY): Payer: MEDICAID | Admitting: Certified Registered Nurse Anesthetist

## 2023-09-26 ENCOUNTER — Emergency Department (HOSPITAL_COMMUNITY): Payer: MEDICAID

## 2023-09-26 ENCOUNTER — Inpatient Hospital Stay (HOSPITAL_COMMUNITY): Payer: MEDICAID

## 2023-09-26 ENCOUNTER — Encounter (HOSPITAL_COMMUNITY): Payer: Self-pay

## 2023-09-26 ENCOUNTER — Other Ambulatory Visit: Payer: Self-pay

## 2023-09-26 DIAGNOSIS — R29709 NIHSS score 9: Secondary | ICD-10-CM | POA: Diagnosis present

## 2023-09-26 DIAGNOSIS — Z720 Tobacco use: Secondary | ICD-10-CM | POA: Diagnosis present

## 2023-09-26 DIAGNOSIS — I10 Essential (primary) hypertension: Secondary | ICD-10-CM | POA: Diagnosis present

## 2023-09-26 DIAGNOSIS — R29701 NIHSS score 1: Secondary | ICD-10-CM | POA: Diagnosis not present

## 2023-09-26 DIAGNOSIS — Z7982 Long term (current) use of aspirin: Secondary | ICD-10-CM

## 2023-09-26 DIAGNOSIS — I63412 Cerebral infarction due to embolism of left middle cerebral artery: Secondary | ICD-10-CM | POA: Diagnosis not present

## 2023-09-26 DIAGNOSIS — I639 Cerebral infarction, unspecified: Principal | ICD-10-CM | POA: Diagnosis present

## 2023-09-26 DIAGNOSIS — Z955 Presence of coronary angioplasty implant and graft: Secondary | ICD-10-CM | POA: Diagnosis not present

## 2023-09-26 DIAGNOSIS — I6389 Other cerebral infarction: Secondary | ICD-10-CM | POA: Diagnosis not present

## 2023-09-26 DIAGNOSIS — R29810 Facial weakness: Secondary | ICD-10-CM | POA: Diagnosis present

## 2023-09-26 DIAGNOSIS — Z8673 Personal history of transient ischemic attack (TIA), and cerebral infarction without residual deficits: Secondary | ICD-10-CM | POA: Diagnosis not present

## 2023-09-26 DIAGNOSIS — I252 Old myocardial infarction: Secondary | ICD-10-CM | POA: Diagnosis not present

## 2023-09-26 DIAGNOSIS — I2510 Atherosclerotic heart disease of native coronary artery without angina pectoris: Secondary | ICD-10-CM | POA: Diagnosis present

## 2023-09-26 DIAGNOSIS — I2102 ST elevation (STEMI) myocardial infarction involving left anterior descending coronary artery: Secondary | ICD-10-CM

## 2023-09-26 DIAGNOSIS — H9190 Unspecified hearing loss, unspecified ear: Secondary | ICD-10-CM | POA: Diagnosis present

## 2023-09-26 DIAGNOSIS — F1410 Cocaine abuse, uncomplicated: Secondary | ICD-10-CM | POA: Diagnosis present

## 2023-09-26 DIAGNOSIS — R471 Dysarthria and anarthria: Secondary | ICD-10-CM | POA: Diagnosis present

## 2023-09-26 DIAGNOSIS — R4701 Aphasia: Secondary | ICD-10-CM | POA: Diagnosis present

## 2023-09-26 DIAGNOSIS — F17210 Nicotine dependence, cigarettes, uncomplicated: Secondary | ICD-10-CM | POA: Diagnosis present

## 2023-09-26 DIAGNOSIS — I509 Heart failure, unspecified: Secondary | ICD-10-CM | POA: Diagnosis present

## 2023-09-26 DIAGNOSIS — I6602 Occlusion and stenosis of left middle cerebral artery: Secondary | ICD-10-CM

## 2023-09-26 DIAGNOSIS — Z7902 Long term (current) use of antithrombotics/antiplatelets: Secondary | ICD-10-CM

## 2023-09-26 DIAGNOSIS — F1910 Other psychoactive substance abuse, uncomplicated: Secondary | ICD-10-CM | POA: Diagnosis present

## 2023-09-26 DIAGNOSIS — I110 Hypertensive heart disease with heart failure: Secondary | ICD-10-CM | POA: Diagnosis present

## 2023-09-26 DIAGNOSIS — Z79899 Other long term (current) drug therapy: Secondary | ICD-10-CM

## 2023-09-26 DIAGNOSIS — G8191 Hemiplegia, unspecified affecting right dominant side: Secondary | ICD-10-CM | POA: Diagnosis present

## 2023-09-26 DIAGNOSIS — Z91148 Patient's other noncompliance with medication regimen for other reason: Secondary | ICD-10-CM

## 2023-09-26 DIAGNOSIS — Z8249 Family history of ischemic heart disease and other diseases of the circulatory system: Secondary | ICD-10-CM | POA: Diagnosis not present

## 2023-09-26 DIAGNOSIS — I69351 Hemiplegia and hemiparesis following cerebral infarction affecting right dominant side: Secondary | ICD-10-CM

## 2023-09-26 DIAGNOSIS — E785 Hyperlipidemia, unspecified: Secondary | ICD-10-CM | POA: Diagnosis present

## 2023-09-26 DIAGNOSIS — I69391 Dysphagia following cerebral infarction: Secondary | ICD-10-CM

## 2023-09-26 HISTORY — PX: RADIOLOGY WITH ANESTHESIA: SHX6223

## 2023-09-26 HISTORY — PX: IR CT HEAD LTD: IMG2386

## 2023-09-26 HISTORY — PX: IR PERCUTANEOUS ART THROMBECTOMY/INFUSION INTRACRANIAL INC DIAG ANGIO: IMG6087

## 2023-09-26 HISTORY — PX: IR US GUIDE VASC ACCESS RIGHT: IMG2390

## 2023-09-26 LAB — COMPREHENSIVE METABOLIC PANEL WITH GFR
ALT: 17 U/L (ref 0–44)
AST: 22 U/L (ref 15–41)
Albumin: 3 g/dL — ABNORMAL LOW (ref 3.5–5.0)
Alkaline Phosphatase: 56 U/L (ref 38–126)
Anion gap: 5 (ref 5–15)
BUN: 11 mg/dL (ref 6–20)
CO2: 26 mmol/L (ref 22–32)
Calcium: 8.8 mg/dL — ABNORMAL LOW (ref 8.9–10.3)
Chloride: 108 mmol/L (ref 98–111)
Creatinine, Ser: 0.97 mg/dL (ref 0.61–1.24)
GFR, Estimated: >60 mL/min (ref >60)
Glucose, Bld: 97 mg/dL (ref 70–99)
Potassium: 4.2 mmol/L (ref 3.5–5.1)
Sodium: 139 mmol/L (ref 135–145)
Total Bilirubin: 0.6 mg/dL (ref 0.0–1.2)
Total Protein: 5.9 g/dL — ABNORMAL LOW (ref 6.5–8.1)

## 2023-09-26 LAB — CBC
HCT: 41.9 % (ref 39.0–52.0)
Hemoglobin: 13.1 g/dL (ref 13.0–17.0)
MCH: 27.9 pg (ref 26.0–34.0)
MCHC: 31.3 g/dL (ref 30.0–36.0)
MCV: 89.3 fL (ref 80.0–100.0)
Platelets: 226 10*3/uL (ref 150–400)
RBC: 4.69 MIL/uL (ref 4.22–5.81)
RDW: 14 % (ref 11.5–15.5)
WBC: 4.8 10*3/uL (ref 4.0–10.5)
nRBC: 0 % (ref 0.0–0.2)

## 2023-09-26 LAB — DIFFERENTIAL
Abs Immature Granulocytes: 0.02 10*3/uL (ref 0.00–0.07)
Basophils Absolute: 0 10*3/uL (ref 0.0–0.1)
Basophils Relative: 1 %
Eosinophils Absolute: 0.3 10*3/uL (ref 0.0–0.5)
Eosinophils Relative: 6 %
Immature Granulocytes: 0 %
Lymphocytes Relative: 25 %
Lymphs Abs: 1.2 10*3/uL (ref 0.7–4.0)
Monocytes Absolute: 0.4 10*3/uL (ref 0.1–1.0)
Monocytes Relative: 8 %
Neutro Abs: 2.9 10*3/uL (ref 1.7–7.7)
Neutrophils Relative %: 60 %

## 2023-09-26 LAB — I-STAT CHEM 8, ED
BUN: 13 mg/dL (ref 6–20)
Calcium, Ion: 1.15 mmol/L (ref 1.15–1.40)
Chloride: 107 mmol/L (ref 98–111)
Creatinine, Ser: 1 mg/dL (ref 0.61–1.24)
Glucose, Bld: 92 mg/dL (ref 70–99)
HCT: 43 % (ref 39.0–52.0)
Hemoglobin: 14.6 g/dL (ref 13.0–17.0)
Potassium: 4.1 mmol/L (ref 3.5–5.1)
Sodium: 140 mmol/L (ref 135–145)
TCO2: 23 mmol/L (ref 22–32)

## 2023-09-26 LAB — CBG MONITORING, ED: Glucose-Capillary: 76 mg/dL (ref 70–99)

## 2023-09-26 LAB — APTT: aPTT: 27 s (ref 24–36)

## 2023-09-26 LAB — GLUCOSE, CAPILLARY
Glucose-Capillary: 201 mg/dL — ABNORMAL HIGH (ref 70–99)
Glucose-Capillary: 136 mg/dL — ABNORMAL HIGH (ref 70–99)

## 2023-09-26 LAB — PROTIME-INR
INR: 1 (ref 0.8–1.2)
Prothrombin Time: 13.3 s (ref 11.4–15.2)

## 2023-09-26 LAB — ETHANOL: Alcohol, Ethyl (B): <15 mg/dL (ref <15)

## 2023-09-26 LAB — SARS CORONAVIRUS 2 BY RT PCR: SARS Coronavirus 2 by RT PCR: NEGATIVE

## 2023-09-26 LAB — MRSA NEXT GEN BY PCR, NASAL: MRSA by PCR Next Gen: NOT DETECTED

## 2023-09-26 SURGERY — RADIOLOGY WITH ANESTHESIA
Anesthesia: General

## 2023-09-26 MED ORDER — IOHEXOL 300 MG/ML  SOLN
50.0000 mL | Freq: Once | INTRAMUSCULAR | Status: AC | PRN
  Administered 2023-09-26: 8 mL via INTRA_ARTERIAL

## 2023-09-26 MED ORDER — SODIUM CHLORIDE 0.9 % IV SOLN: INTRAVENOUS | Status: DC

## 2023-09-26 MED ORDER — SENNOSIDES-DOCUSATE SODIUM 8.6-50 MG PO TABS: 1.0000 | ORAL_TABLET | Freq: Every evening | ORAL | Status: DC | PRN

## 2023-09-26 MED ORDER — HEPARIN SODIUM (PORCINE) 1000 UNIT/ML IJ SOLN
INTRAMUSCULAR | Status: DC | PRN
  Administered 2023-09-26: 3000 [IU] via INTRAVENOUS

## 2023-09-26 MED ORDER — PROPOFOL 10 MG/ML IV BOLUS
INTRAVENOUS | Status: DC | PRN
Start: 2023-09-26 — End: 2023-09-26
  Administered 2023-09-26: 150 mg via INTRAVENOUS
  Administered 2023-09-26: 50 mg via INTRAVENOUS

## 2023-09-26 MED ORDER — FENTANYL CITRATE (PF) 100 MCG/2ML IJ SOLN
INTRAMUSCULAR | Status: AC
  Filled 2023-09-26: qty 2

## 2023-09-26 MED ORDER — LIDOCAINE 2% (20 MG/ML) 5 ML SYRINGE
INTRAMUSCULAR | Status: DC | PRN
  Administered 2023-09-26: 60 mg via INTRAVENOUS

## 2023-09-26 MED ORDER — DEXAMETHASONE SODIUM PHOSPHATE 10 MG/ML IJ SOLN
INTRAMUSCULAR | Status: DC | PRN
  Administered 2023-09-26: 5 mg via INTRAVENOUS

## 2023-09-26 MED ORDER — STROKE: EARLY STAGES OF RECOVERY BOOK
Freq: Once | Status: AC
  Filled 2023-09-26 (×2): qty 1

## 2023-09-26 MED ORDER — ACETAMINOPHEN 325 MG PO TABS
650.0000 mg | ORAL_TABLET | ORAL | Status: DC | PRN
  Administered 2023-09-26: 650 mg via ORAL
  Filled 2023-09-26: qty 2

## 2023-09-26 MED ORDER — LACTATED RINGERS IV SOLN: INTRAVENOUS | Status: DC | PRN

## 2023-09-26 MED ORDER — EPHEDRINE SULFATE-NACL 50-0.9 MG/10ML-% IV SOSY
PREFILLED_SYRINGE | INTRAVENOUS | Status: DC | PRN
  Administered 2023-09-26: 10 mg via INTRAVENOUS
  Administered 2023-09-26: 5 mg via INTRAVENOUS
  Administered 2023-09-26: 10 mg via INTRAVENOUS

## 2023-09-26 MED ORDER — ONDANSETRON HCL 4 MG/2ML IJ SOLN
INTRAMUSCULAR | Status: DC | PRN
  Administered 2023-09-26: 4 mg via INTRAVENOUS

## 2023-09-26 MED ORDER — IOHEXOL 350 MG/ML SOLN
100.0000 mL | Freq: Once | INTRAVENOUS | Status: AC | PRN
  Administered 2023-09-26: 100 mL via INTRAVENOUS

## 2023-09-26 MED ORDER — PHENYLEPHRINE HCL-NACL 20-0.9 MG/250ML-% IV SOLN
INTRAVENOUS | Status: DC | PRN
  Administered 2023-09-26: 25 ug/min via INTRAVENOUS

## 2023-09-26 MED ORDER — ACETAMINOPHEN 160 MG/5ML PO SOLN: 650.0000 mg | ORAL | Status: DC | PRN

## 2023-09-26 MED ORDER — FENTANYL CITRATE (PF) 100 MCG/2ML IJ SOLN
INTRAMUSCULAR | Status: DC | PRN
Start: 2023-09-26 — End: 2023-09-26
  Administered 2023-09-26: 100 ug via INTRAVENOUS

## 2023-09-26 MED ORDER — ACETAMINOPHEN 650 MG RE SUPP: 650.0000 mg | RECTAL | Status: DC | PRN

## 2023-09-26 MED ORDER — IOHEXOL 300 MG/ML  SOLN
150.0000 mL | Freq: Once | INTRAMUSCULAR | Status: AC | PRN
  Administered 2023-09-26: 40 mL via INTRA_ARTERIAL

## 2023-09-26 MED ORDER — SUCCINYLCHOLINE CHLORIDE 200 MG/10ML IV SOSY
PREFILLED_SYRINGE | INTRAVENOUS | Status: DC | PRN
  Administered 2023-09-26: 120 mg via INTRAVENOUS

## 2023-09-26 MED ORDER — LABETALOL HCL 5 MG/ML IV SOLN
INTRAVENOUS | Status: DC | PRN
  Administered 2023-09-26: 5 mg via INTRAVENOUS

## 2023-09-26 MED ORDER — PHENYLEPHRINE 80 MCG/ML (10ML) SYRINGE FOR IV PUSH (FOR BLOOD PRESSURE SUPPORT)
PREFILLED_SYRINGE | INTRAVENOUS | Status: DC | PRN
Start: 2023-09-26 — End: 2023-09-26
  Administered 2023-09-26 (×3): 80 ug via INTRAVENOUS

## 2023-09-26 NOTE — ED Provider Notes (Signed)
 [place] EMERGENCY DEPARTMENT AT [facility] Provider Note   CSN: [id number] Arrival date & time: 09/26/23  4098     History  Chief Complaint  Patient presents with   Altered Mental Status    Mark Herrera is a 63 y.o. male.  HPI Patient presents via EMS with concern for neurologic changes.  Last seen normal 8 hours prior to my evaluation.  Almost immediately after my initial evaluation patient does meet as a code stroke due to LVO positive findings. EMS reports the patient was in his usual state of health last night, roommates awakened the patient today and he was seemingly unable to hear, unable to speak, and was not moving his extremities appropriately.  Patient does not follow commands, he was designated as a code stroke. EMS does not report hemodynamic instability.    Home Medications Prior to Admission medications   Medication Sig Start Date End Date Taking? Authorizing Provider  acetaminophen  (TYLENOL ) 325 MG tablet Take 2 tablets (650 mg total) by mouth every 4 (four) hours as needed for mild pain (or temp > 37.5 C (99.5 F)). 09/19/21   Angiulli, Everlyn Hockey, PA-C  atorvastatin  (LIPITOR ) 40 MG tablet Take 1 tablet (40 mg total) by mouth daily. 09/19/21   Angiulli, Everlyn Hockey, PA-C  citalopram  (CELEXA ) 20 MG tablet Take 1 tablet (20 mg total) by mouth daily. 09/19/21   Angiulli, Everlyn Hockey, PA-C  metoCLOPramide  (REGLAN ) 10 MG tablet Take 1 tablet (10 mg total) by mouth every 6 (six) hours. 11/11/22   Ninetta Basket, MD  nicotine  (NICODERM CQ  - DOSED IN MG/24 HR) 7 mg/24hr patch Use one 7 mg patch daily 09/19/21   Angiulli, Daniel J, PA-C  senna-docusate (SENOKOT-S) 8.6-50 MG tablet Take 1 tablet by mouth daily with supper. 09/19/21   Angiulli, Everlyn Hockey, PA-C      Allergies    Patient has no known allergies.    Review of Systems   Review of Systems  Physical Exam Updated Vital Signs BP (!) 150/100 (BP Location: Right Arm)   Pulse 66   Temp 98.7 F (37.1 C)  (Oral)   Resp 20   Ht 5\' 9"  (1.753 m)   Wt 72.6 kg   SpO2 100%   BMI 23.64 kg/m  Physical Exam Vitals and nursing note reviewed.  Constitutional:      General: He is not in acute distress.    Appearance: He is well-developed.  HENT:     Head: Normocephalic and atraumatic.  Eyes:     Conjunctiva/sclera: Conjunctivae normal.  Cardiovascular:     Rate and Rhythm: Normal rate and regular rhythm.  Pulmonary:     Effort: Pulmonary effort is normal. No respiratory distress.     Breath sounds: No stridor.  Abdominal:     General: There is no distension.  Skin:    General: Skin is warm and dry.  Neurological:     Mental Status: He is alert.     Comments: Mixed aphasia receptive and expressive with occasional inconsistent speech. Patient with right greater than left weakness, inconsistent following of commands, incapable of holding either arm against gravity, does not elevate right leg to command.  Psychiatric:        Cognition and Memory: Cognition is impaired.     ED Results / Procedures / Treatments   Labs (all labs ordered are listed, but only abnormal results are displayed) Labs Reviewed  COMPREHENSIVE METABOLIC PANEL WITH GFR - Abnormal; Notable for the following  components:      Result Value   Calcium  8.8 (*)    Total Protein 5.9 (*)    Albumin 3.0 (*)    All other components within normal limits  ETHANOL  PROTIME-INR  APTT  CBC  DIFFERENTIAL  RAPID URINE DRUG SCREEN, HOSP PERFORMED  CBG MONITORING, ED  I-STAT CHEM 8, ED    EKG None  Radiology CT ANGIO HEAD NECK W WO CM W PERF (CODE STROKE) Result Date: 09/26/2023 CLINICAL DATA:  Code stroke EXAM: CT ANGIOGRAPHY HEAD AND NECK CT PERFUSION BRAIN TECHNIQUE: Multidetector CT imaging of the head and neck was performed using the standard protocol during bolus administration of intravenous contrast. Multiplanar CT image reconstructions and MIPs were obtained to evaluate the vascular anatomy. Carotid stenosis  measurements (when applicable) are obtained utilizing NASCET criteria, using the distal internal carotid diameter as the denominator. Multiphase CT imaging of the brain was performed following IV bolus contrast injection. Subsequent parametric perfusion maps were calculated using RAPID software. RADIATION DOSE REDUCTION: This exam was performed according to the departmental dose-optimization program which includes automated exposure control, adjustment of the mA and/or kV according to patient size and/or use of iterative reconstruction technique. CONTRAST:  OMNIPAQUE  IOHEXOL  350 MG/ML SOLN COMPARISON:  Noncontrast CT from earlier today FINDINGS: CTA NECK FINDINGS Aortic arch: Atheromatous wall thickening. No acute finding or dilatation Right carotid system: Mild atheromatous wall thickening. No stenosis, ulceration, or beading. Left carotid system: Mild atheromatous wall thickening. No stenosis, ulceration, or beading. Vertebral arteries: No proximal subclavian stenosis. The left vertebral artery is dominant. Plaque at the left vertebral origin with mild narrowing. No vertebral beading or dissection. Skeleton: Advanced cervical spine degeneration. Severe dental caries. Other neck: No acute finding Upper chest: No acute finding Review of the MIP images confirms the above findings CTA HEAD FINDINGS Anterior circulation: Atheromatous calcification of the cavernous carotids. Abrupt cut off at the left M1 2 junction. On the right there is diminished flow beginning at proximal right M2 level correlating to chronic infarction. Negative for aneurysm Posterior circulation: Left dominant vertebral artery. The vertebral and basilar arteries are smoothly contoured and widely patent. No branch occlusion, beading, or aneurysm Venous sinuses: Diffusely patent Anatomic variants: None significant Review of the MIP images confirms the above findings CT Brain Perfusion Findings: ASPECTS: 9 (updated history of right-sided  weakness with cytotoxic edema seen at the left insula). CBF (<30%) Volume: 0 Perfusion (Tmax>6.0s) volume: 68 cc, minority of this volume localizing to old right MCA territory infarct. Case discussed with Dr. Doretta Gant at 7:51 a.m. IMPRESSION: Emergent large vessel occlusion at the left M1/2 junction with extensive penumbra. No core infarct by CT perfusion but there is left insular infarct and aspects of 9. Diminished flow in right MCA branches correlating with large remote infarction. Atherosclerosis without flow reducing stenosis or ulceration of major arteries in the neck. Electronically Signed   By: Ronnette Coke M.D.   On: 09/26/2023 08:00   CT HEAD CODE STROKE WO CONTRAST Result Date: 09/26/2023 CLINICAL DATA:  Code stroke. Aphasia and left upper extremity weakness EXAM: CT HEAD WITHOUT CONTRAST TECHNIQUE: Contiguous axial images were obtained from the base of the skull through the vertex without intravenous contrast. RADIATION DOSE REDUCTION: This exam was performed according to the departmental dose-optimization program which includes automated exposure control, adjustment of the mA and/or kV according to patient size and/or use of iterative reconstruction technique. COMPARISON:  Brain MRI 11/11/2022 FINDINGS: Brain: Chronic right MCA distribution infarct affecting the  insula and posterior frontal cortex primarily. No evidence of adjacent acute infarction. No hemorrhage, shift, hydrocephalus, or mass. Vascular: No hyperdense vessel or unexpected calcification. Skull: Normal. Negative for fracture or focal lesion. Sinuses/Orbits: Gaze to the left Other: Prelim sent in epic chat ASPECTS [facility] Stroke Program Early CT Score) Not scored in the setting of extensive chronic infarction IMPRESSION: Large remote right MCA territory infarction. No progression or other acute finding when compared to 11/11/2022 brain MRI Electronically Signed   By: Ronnette Coke M.D.   On: 09/26/2023 07:38     Procedures Procedures    Medications Ordered in ED Medications  iohexol  (OMNIPAQUE ) 350 MG/ML injection 100 mL (100 mLs Intravenous Contrast Given 09/26/23 0747)    ED Course/ Medical Decision Making/ A&P                                 Medical Decision Making Patient presents with acute change in neuro condition since last night.  Patient is out of window for TNK, within 24-hour window for concern of LVO, was designated as a code stroke after my evaluation.  Other considerations include mass, infection, encephalopathy, intoxication. Cardiac 70 sinus normal pulse ox 100% room air normal.  Amount and/or Complexity of Data Reviewed Independent Historian: EMS External Data Reviewed: notes. Labs: ordered. Decision-making details documented in ED Course. Radiology: ordered and independent interpretation performed. Decision-making details documented in ED Course. ECG/medicine tests: ordered and independent interpretation performed. Decision-making details documented in ED Course.  Risk Decision regarding hospitalization.  8:15 AM Patient markedly improved on repeat exam.  Have reviewed the patient CT imaging, discussed with our neuro colleagues.  Concern for large vessel occlusion on CT discussed.  8:46 AM Patient going to interventional radiology.  IR team confirmed with the patient's son for consent.  CRITICAL CARE Performed by: Dorenda Gandy Total critical care time: 35 minutes Critical care time was exclusive of separately billable procedures and treating other patients. Critical care was necessary to treat or prevent imminent or life-threatening deterioration. Critical care was time spent personally by me on the following activities: development of treatment plan with patient and/or surrogate as well as nursing, discussions with consultants, evaluation of patient's response to treatment, examination of patient, obtaining history from patient or surrogate, ordering and  performing treatments and interventions, ordering and review of laboratory studies, ordering and review of radiographic studies, pulse oximetry and re-evaluation of patient's condition.  Final Clinical Impression(s) / ED Diagnoses Final diagnoses:  Acute CVA (cerebrovascular accident) [facility])    Rx / DC Orders ED Discharge Orders     None         Dorenda Gandy, MD 09/26/23 [phone]

## 2023-09-26 NOTE — ED Notes (Signed)
 ED Provider at bedside.

## 2023-09-26 NOTE — Transfer of Care (Signed)
 Immediate Anesthesia Transfer of Care Note  Patient: Mark Herrera  Procedure(s) Performed: RADIOLOGY WITH ANESTHESIA  Patient Location: PACU  Anesthesia Type:General  Level of Consciousness: drowsy  Airway & Oxygen Therapy: Patient Spontanous Breathing and Patient connected to face mask oxygen  Post-op Assessment: Report given to RN, Post -op Vital signs reviewed and stable, and Patient moving all extremities X 4  Post vital signs: Reviewed and stable  Last Vitals:  Vitals Value Taken Time  BP 127/73 09/26/23 0945  Temp    Pulse 55 09/26/23 0946  Resp 17 09/26/23 0946  SpO2 100 % 09/26/23 0946  Vitals shown include unfiled device data.  Last Pain:  Vitals:   09/26/23 0715  TempSrc: Oral         Complications: No notable events documented.

## 2023-09-26 NOTE — H&P (Signed)
 NEUROLOGY H&P NOTE   Date of service: September 26, 2023 Patient Name: Mark Herrera MRN:  [id number] DOB:  29-Nov-1960 Chief Complaint: "I can't hear" Requesting Provider: Stroke, Md, MD  History of Present Illness  Mark Herrera is a 63 y.o. 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  Patient lives with roommates and was last seen in his usual state of health at 56 PM last night.  This morning patient was seen by roommates and patient's girlfriend with right-sided weakness, hearing deficit, and speech disturbance.  On arrival to the ED, patient was felt to have right greater than left sided weakness with aphasia and dysarthria in addition to lethargy with concern for LVO presentation and a code stroke was subsequently activated.  At baseline, patient is able to work and manage his affairs independently.  He states that he is able to walk without use of assistive device but he limps with walking and drags his right leg.   LKW: 09/25/23 @ 23:00 Modified rankin score: 1-No significant post stroke disability and can perform usual duties with stroke symptoms IV Thrombolysis: No, patient presented outside the thrombolytic therapy time window EVT: Yes, ELVO identified on vessel imaging at the left M1/M2 junction with extensive penumbra.  Discussed risk, benefits, alternatives with patient and patient's son via telephone due to fluctuating degrees of patient aphasia.  Both the patient and his son consented to IR thrombectomy.  NIHSS components Score: Comment  1a Level of Conscious 0[x]  1[]  2[]  3[]      1b LOC Questions 0[x]  1[]  2[]       1c LOC Commands 0[x]  1[]  2[]       2 Best Gaze 0[x]  1[]  2[]       3 Visual 0[x]  1[]  2[]  3[]      4  Facial Palsy 0[x]  1[]  2[]  3[]      5a Motor Arm - left 0[x]  1[]  2[]  3[]  4[]  UN[]    5b Motor Arm - Right 0[]  1[]  2[x]  3[]  4[]  UN[]    6a Motor Leg - Left 0[]  1[x]  2[]  3[]  4[]  UN[]    6b Motor Leg - Right 0[]  1[]  2[]  3[x]  4[]  UN[]    7 Limb Ataxia 0[x]  1[]  2[]  3[]  UN[]     8 Sensory 0[]  1[x]  2[]  UN[]      9 Best Language 0[]  1[x]  2[]  3[]      10 Dysarthria 0[]  1[x]  2[]  UN[]      11 Extinct. and Inattention 0[x]  1[]  2[]       TOTAL: 9    ROS  Comprehensive ROS performed and pertinent positives documented in HPI   Past History   Past Medical History:  Diagnosis Date   CAD (coronary artery disease)    10/08/16 STEMI DES x1 to pLAD EF 45%   Cocaine abuse (HCC)    CVA (cerebral vascular accident) (HCC)    Gout    Tobacco abuse    Past Surgical History:  Procedure Laterality Date   CORONARY STENT INTERVENTION N/A 10/08/2016   Procedure: Coronary Stent Intervention;  Surgeon: Odie Benne, MD;  Location: [facility] INVASIVE CV [facility];  Service: Cardiovascular;  Laterality: N/A;   IR CT HEAD LTD  05/31/2020   IR PERCUTANEOUS ART THROMBECTOMY/INFUSION INTRACRANIAL INC DIAG ANGIO  05/31/2020   IR US  GUIDE VASC ACCESS  RIGHT  05/31/2020   LEFT HEART CATH AND CORONARY ANGIOGRAPHY N/A 10/08/2016   Procedure: Left Heart Cath and Coronary Angiography;  Surgeon: Odie Benne, MD;  Location: [facility] INVASIVE CV LAB;  Service: Cardiovascular;  Laterality: N/A;   RADIOLOGY WITH ANESTHESIA N/A 05/31/2020   Procedure: IR WITH ANESTHESIA;  Surgeon: Radiologist, Medication, MD;  Location: [facility] OR;  Service: Radiology;  Laterality: N/A;   RADIOLOGY WITH ANESTHESIA N/A 09/05/2021   Procedure: RADIOLOGY WITH ANESTHESIA;  Surgeon: Radiologist, Medication, MD;  Location: [facility] OR;  Service: Radiology;  Laterality: N/A;   TOE SURGERY     Family History: Family History  Problem Relation Age of Onset   Hypertension Father    Social History  reports that he has been smoking cigarettes. He has never used smokeless  tobacco. He reports current alcohol use. He reports current drug use. Drug: Marijuana.  No Known Allergies  Medications   Current Facility-Administered Medications:    [START ON 09/27/2023]  stroke: early stages of recovery book, , Does not apply, Once, Toberman, Stevi W, NP   0.9 %  sodium chloride  infusion, , Intravenous, Continuous, Toberman, Stevi W, NP   acetaminophen  (TYLENOL ) tablet 650 mg, 650 mg, Oral, Q4H PRN **OR** acetaminophen  (TYLENOL ) 160 MG/5ML solution 650 mg, 650 mg, Per Tube, Q4H PRN **OR** acetaminophen  (TYLENOL ) suppository 650 mg, 650 mg, Rectal, Q4H PRN, Toberman, Stevi W, NP   iohexol  (OMNIPAQUE ) 300 MG/ML solution 150 mL, 150 mL, Intra-arterial, Once PRN, Reagan Camera, MD   iohexol  (OMNIPAQUE ) 300 MG/ML solution 50 mL, 50 mL, Intra-arterial, Once PRN, Reagan Camera, MD   senna-docusate (Senokot-S) tablet 1 tablet, 1 tablet, Oral, QHS PRN, Toberman, Stevi W, NP  Current Outpatient Medications:    acetaminophen  (TYLENOL ) 325 MG tablet, Take 2 tablets (650 mg total) by mouth every 4 (four) hours as needed for mild pain (or temp > 37.5 C (99.5 F))., Disp: , Rfl:    atorvastatin  (LIPITOR ) 40 MG tablet, Take 1 tablet (40 mg total) by mouth daily., Disp: 30 tablet, Rfl: 0   citalopram  (CELEXA ) 20 MG tablet, Take 1 tablet (20 mg total) by mouth daily., Disp: 30 tablet, Rfl: 0   metoCLOPramide  (REGLAN ) 10 MG tablet, Take 1 tablet (10 mg total) by mouth every 6 (six) hours., Disp: 15 tablet, Rfl: 0   nicotine  (NICODERM CQ  - DOSED IN MG/24 HR) 7 mg/24hr patch, Use one 7 mg patch daily, Disp: 28 patch, Rfl: 0   senna-docusate (SENOKOT-S) 8.6-50 MG tablet, Take 1 tablet by mouth daily with supper., Disp: , Rfl:   Vitals   Vitals:   09/26/23 0712 09/26/23 0715  BP:  (!) 150/100  Pulse:  66  Resp:  20  Temp:  98.7 F (37.1 C)  TempSrc:  Oral  SpO2:  100%  Weight: 72.6 kg   Height: 5\' 9"  (1.753 m)     Body mass index is 23.64 kg/m.  Physical Exam   Constitutional:  Well-developed and well-nourished African American male laying in ER stretcher, in no acute distress Psych: Affect appropriate to situation. He is intermittently and appropriately tearful and anxious throughout exam.  Eyes: No scleral injection.  HENT: No OP obstruction.  Head: Normocephalic.  Cardiovascular: Normal rate and regular rhythm on bedside cardiac monitor Respiratory: Effort normal, non-labored breathing on room air GI: Soft.  No distension. There is no tenderness.  Skin: WDI.   Neurologic Examination   Mental Status: Patient is initially asleep but wakes to touch and loud voice.  Initially he  is only able to state his name and perseverates on "I can't hear" and "no, no no" or "I don't".  With further evaluation, he is able to state his name, tells us  he is turning 63 next week, and perseverates on "I have to go to work at 7".  Further throughout evaluation, patient is able to communicate more with providers stating he recalls this morning his girlfriend stating "I am going to have to call an ambulance" and gives further details about chronic deficits versus new deficits. He is able to follow commands more readily throughout exam.  He has varying degrees of fluent aphasia throughout exam with slight worsening with patient's heightened emotional responses. Slight dysarthria noted as well.  He is able to name objects correctly with some delay in responses.  Cranial Nerves: II: Visual Fields are full. Pupils are equal, round, and reactive to light.   III,IV, VI: EOMI without ptosis or diploplia.  V: Facial sensation is intact and symmetric to light touch VII: Face is symmetric resting and with movement VIII: Patient is hard of hearing- new onset this morning per patient  X: Phonation intact, palate elevates symmetrically XI: Shoulder shrug is symmetric. XII: Tongue protrudes midline  Motor: Tone is increased on the right lower extremity. Bulk is normal.  Motor exam fluctuates with  patient effort throughout exam.  Initially, patient drifts with the left upper extremity minimally but on reassessment, he is able to elevate antigravity without vertical drift.  Right upper extremity has some antigravity movement but drifts to bed initially, later, he is able to maintain antigravity movement with vertical drift.  Left lower extremity with minimal vertical drift.  Right lower extremity moves without gravity. Sensory: Decree sensation to light touch on the right upper and lower extremity though patient says this is baseline from his previous stroke. Cerebellar: FNF intact bilaterally with some limitation of evaluation of FNF on the RUE due to reported right shoulder pain. Does not perform HKS  Labs/Imaging/Neurodiagnostic studies   CBC:  Recent Labs  Lab Oct 06, 2023 0722 2023-10-06 0726  WBC 4.8  --   NEUTROABS 2.9  --   HGB 13.1 14.6  HCT 41.9 43.0  MCV 89.3  --   PLT 226  --    Basic Metabolic Panel:  Lab Results  Component Value Date   NA 140 2023/10/06   K 4.1 06-Oct-2023   CO2 26 10-06-2023   GLUCOSE 92 10-06-23   BUN 13 06-Oct-2023   CREATININE 1.00 10/06/23   CALCIUM  8.8 (L) 06-Oct-2023   GFRNONAA >60 10-06-23   GFRAA >60 06/01/2019   Lipid Panel:  Lab Results  Component Value Date   LDLCALC 105 (H) 09/06/2021   HgbA1c:  Lab Results  Component Value Date   HGBA1C 5.3 09/06/2021   Urine Drug Screen:     Component Value Date/Time   LABOPIA NONE DETECTED 11/11/2022 1307   COCAINSCRNUR POSITIVE (A) 11/11/2022 1307   LABBENZ NONE DETECTED 11/11/2022 1307   AMPHETMU NONE DETECTED 11/11/2022 1307   THCU POSITIVE (A) 11/11/2022 1307   LABBARB NONE DETECTED 11/11/2022 1307    Alcohol Level     Component Value Date/Time   ETH <15 2023/10/06 0722   INR  Lab Results  Component Value Date   INR 1.0 10-06-23   APTT  Lab Results  Component Value Date   APTT 27 10-06-23   AED levels: No results found for: "PHENYTOIN", "ZONISAMIDE",  "LAMOTRIGINE", "LEVETIRACETA"  CT Head without contrast(Personally reviewed): Large remote right MCA  territory infarction. No progression or other acute finding when compared to 11/11/2022 brain MRI  CT angio Head and Neck with contrast(Personally reviewed): - Emergent large vessel occlusion at the left M1/2 junction with extensive penumbra. No core infarct by CT perfusion but there is left insular infarct and aspects of 9. - Diminished flow in right MCA branches correlating with large remote infarction. - Atherosclerosis without flow reducing stenosis or ulceration of major arteries in the neck.  ASSESSMENT   Mark Herrera is a 63 y.o. 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  A code stroke was activated on arrival and patient was taken to CT where imaging revealed an emergent LVO at the left M1/M2 junction with extensive penumbra and patient was taken to IR for thrombectomy.  Plan  Acute Ischemic Stroke Cerebral infarction due to embolism of left middle cerebral artery   Acuity: Acute Current Suspected Etiology: work up in progress Continue Evaluation:  -Admit to: ICU -Prophylaxis per IR -Blood pressure control, goal per IR -MRI/ECHO/A1C/Lipid panel. -Hyperglycemia management per SSI to maintain glucose 140-180mg /dL. -PT/OT/ST therapies and recommendations when able  CNS Dysarthria Dysphagia following cerebral infarction  -NPO until cleared by speech/bedside swallow -ST as needed -Advance diet as tolerated  Hemiplegia and hemiparesis following cerebral infarction affecting right dominant side  -PT/OT  RESP Intubated for IR procedure -vent management per ICU if patient remains intubated post-IR -wean when able -Supplemental oxygen for SpO2 >  94%  CV Hypertension on arrival, not on home medication -Aggressive BP control, goal SBP per IR -PRN labetalol  and cleviprex  as needed -Avoid hypotension   Hyperlipidemia, unspecified  - Statin for goal LDL < 70  HEME AM CBC -Monitor H&H -Transfuse for hgb < 7  ENDO A1c pending -SSI as needed -goal HgbA1c < 7%  GI/GU -Gentle hydration while NPO  Fluid/Electrolyte Disorders AM CMP -Replete as needed -Repeat labs -Trend  ID Trend fever and WBC curve  Prophylaxis DVT:  SCDs GI: PPI Bowel: Docusate / Senna  Diet: NPO until cleared by speech  Code Status: Full Code    THE FOLLOWING WERE PRESENT ON ADMISSION: CNS -  Acute Ischemic Stroke, Hemiplegia ______________________________________________________________________  Signed, Louretta Royals, NP [facility]   Attending Neurohospitalist Addendum Patient seen and examined with APP/Resident. Agree with the history and physical as documented above. Agree with the plan as documented, which I helped formulate. I have edited the note above to reflect my full findings and recommendations. I have independently reviewed the chart, obtained history, review of systems and examined the patient.I have personally reviewed pertinent head/neck/spine imaging (CT/MRI). Please feel free to call with any questions.  This patient is critically ill and at significant risk of neurological worsening, death and care requires constant monitoring of vital signs, hemodynamics,respiratory and cardiac monitoring, neurological assessment, discussion with family, other specialists and medical decision making of high complexity. I spent 95 minutes of neurocritical care time  in the care of  this patient. This was time spent independent of any time provided by nurse practitioner or PA.  Greg Leaks, MD [facility] [phone]  If 7pm- 7am, please page neurology on call as listed in AMION.

## 2023-09-26 NOTE — Sedation Documentation (Signed)
 Handoff with PACU RN.  Patient's right fem site remains level 0 with dressing of gauze and tegaderm clean/dry/intact.  6 fr Angioseal was deployed at 0909.  Pulses remain intact.

## 2023-09-26 NOTE — Anesthesia Procedure Notes (Signed)
 Procedure Name: Intubation Date/Time: 09/26/2023 8:37 AM  Performed by: Colen Daunt, CRNAPre-anesthesia Checklist: Patient identified, Emergency Drugs available, Suction available and Patient being monitored Patient Re-evaluated:Patient Re-evaluated prior to induction Oxygen Delivery Method: Circle system utilized Preoxygenation: Pre-oxygenation with 100% oxygen Induction Type: IV induction, Rapid sequence and Cricoid Pressure applied Laryngoscope Size: 2 and Miller Grade View: Grade II Tube type: Oral Tube size: 7.5 mm Number of attempts: 1 Airway Equipment and Method: Stylet Placement Confirmation: ETT inserted through vocal cords under direct vision, positive ETCO2 and breath sounds checked- equal and bilateral Secured at: 22 cm Tube secured with: Tape Dental Injury: Teeth and Oropharynx as per pre-operative assessment

## 2023-09-26 NOTE — ED Triage Notes (Addendum)
 Pt to ED via [facility] from home. Pt went to bed at 2300 last night. Pt's roommate noted this am that he was not responding to him. Pt c/o not being able to hear which is new for him. Pt also having expressive aphasia per EMS which is also new for pt.   Pt alert, unable to answer questions, states he is unable to hear.   EMS VS 158/92 58 16 100% RA Cbg 108 18g LAC

## 2023-09-26 NOTE — ED Notes (Signed)
 FALLS PRECAUTIONS implemented with door sign, wrist band, non-slip socks.

## 2023-09-26 NOTE — Anesthesia Postprocedure Evaluation (Signed)
 Anesthesia Post Note  Patient: Mark Herrera  Procedure(s) Performed: RADIOLOGY WITH ANESTHESIA     Patient location during evaluation: PACU Anesthesia Type: General Level of consciousness: awake and alert Pain management: pain level controlled Vital Signs Assessment: post-procedure vital signs reviewed and stable Respiratory status: spontaneous breathing, nonlabored ventilation and respiratory function stable Cardiovascular status: blood pressure returned to baseline and stable Postop Assessment: no apparent nausea or vomiting Anesthetic complications: no   No notable events documented.  Last Vitals:  Vitals:   09/26/23 1045 09/26/23 1100  BP: 139/77 127/79  Pulse: (!) 52 (!) 48  Resp: 11 11  Temp:    SpO2: 100% 100%    Last Pain:  Vitals:   09/26/23 1045  TempSrc:   PainSc: 0-No pain   Pain Goal:    LLE Motor Response: Purposeful movement, Responds to commands (09/26/23 1045)   RLE Motor Response: Purposeful movement, Responds to commands (09/26/23 1045)          [name]

## 2023-09-26 NOTE — Anesthesia Preprocedure Evaluation (Signed)
 Anesthesia Evaluation  Patient identified by MRN, date of birth, ID band Patient awake    Reviewed: Unable to perform ROS - Chart review onlyPreop documentation limited or incomplete due to emergent nature of procedure.  Airway Mallampati: III     Mouth opening: Limited Mouth Opening  Dental  (+) Poor Dentition, Missing, Chipped   Pulmonary Current Smoker   breath sounds clear to auscultation + decreased breath sounds      Cardiovascular hypertension, Pt. on medications + CAD and + Past MI  Normal cardiovascular exam Rhythm:Regular Rate:Normal     Neuro/Psych Code stroke this am CVA    GI/Hepatic negative GI ROS,,,(+)     substance abuse  cocaine use  Endo/Other  negative endocrine ROS    Renal/GU      Musculoskeletal negative musculoskeletal ROS (+)    Abdominal   Peds  Hematology negative hematology ROS (+)   Anesthesia Other Findings   Reproductive/Obstetrics                              Anesthesia Physical Anesthesia Plan  ASA: 3 and emergent  Anesthesia Plan: General   Post-op Pain Management: Minimal or no pain anticipated   Induction: Intravenous  PONV Risk Score and Plan: Treatment may vary due to age or medical condition  Airway Management Planned: Oral ETT  Additional Equipment: None  Intra-op Plan:   Post-operative Plan: Extubation in OR  Informed Consent: I have reviewed the patients History and Physical, chart, labs and discussed the procedure including the risks, benefits and alternatives for the proposed anesthesia with the patient or authorized representative who has indicated his/her understanding and acceptance.     Dental advisory given  Plan Discussed with: CRNA and Anesthesiologist  Anesthesia Plan Comments:          Anesthesia Quick Evaluation

## 2023-09-26 NOTE — ED Notes (Signed)
 CCMD notified via telephone.

## 2023-09-26 NOTE — Procedures (Signed)
 NIR Procedure Note  Preop Dx: Acute ischemic stroke Post Dx: Left M2 occlusion, inferior division  Procedure: Left MCA thrombectomy Operator: Reagan Camera MD  EBL: 50 Complications: No immediate  Findings: Left inferior M2 occlusion, treated with suction thrombectomy TICI Score: 2c  Length of Bedrest: 2 hours from NIR standpoint BP goal next 24 hours: SBP 120-160  Sheath Closure: Angioseal Disposition: PACU in stable conditon  Please call with questions, concerns, or change in patient condition  Reagan Camera MD

## 2023-09-26 NOTE — ED Notes (Signed)
 Patient's clothing removed at this time (shirt, underwear, shorts, sweat pants, and 2 socks) and placed in patient belonging bag. Wallet and keys placed with clothing.

## 2023-09-26 NOTE — Code Documentation (Signed)
 Stroke Response Nurse Documentation Code Documentation  Mark Herrera is a 63 y.o. male arriving to [facility]  via [facility] on 4/25 with past medical hx of CVA, CAD, substance abuse. On No antithrombotic. Code stroke was activated by ED.   Patient from home where he was LKW at 2350 on 4/24 and now complaining of speech deficit and right sided weakness, discovered by roommate this morning who called EMS.    Stroke team at the bedside after code stroke activation by EDP. Labs drawn and patient cleared for CT by Dr. Liam Redhead. Patient to CT with team. NIHSS 9, see documentation for details and code stroke times. Patient with right arm weakness, bilateral leg weakness, right decreased sensation, Global aphasia , and dysarthria  on exam. The following imaging was completed:  CT Head, CTA, and CTP. Patient is not a candidate for IV Thrombolytic due to LKW 2350. Patient is a candidate for IR due to LVO on CTA.   Care Plan: IR thrombectomy.   Bedside handoff with IR RN Tod Forward.    Marlon Simpson  Stroke Response RN

## 2023-09-27 ENCOUNTER — Inpatient Hospital Stay (HOSPITAL_COMMUNITY)

## 2023-09-27 DIAGNOSIS — I6389 Other cerebral infarction: Secondary | ICD-10-CM

## 2023-09-27 DIAGNOSIS — R29701 NIHSS score 1: Secondary | ICD-10-CM

## 2023-09-27 LAB — LIPID PANEL
Cholesterol: 164 mg/dL (ref 0–200)
HDL: 52 mg/dL (ref >40)
LDL Cholesterol: 105 mg/dL — ABNORMAL HIGH (ref 0–99)
Total CHOL/HDL Ratio: 3.2 ratio
Triglycerides: 33 mg/dL (ref <150)
VLDL: 7 mg/dL (ref 0–40)

## 2023-09-27 LAB — RAPID URINE DRUG SCREEN, HOSP PERFORMED
Amphetamines: NOT DETECTED
Barbiturates: NOT DETECTED
Benzodiazepines: NOT DETECTED
Cocaine: POSITIVE — AB
Opiates: NOT DETECTED
Tetrahydrocannabinol: NOT DETECTED

## 2023-09-27 LAB — CBC
HCT: 37.8 % — ABNORMAL LOW (ref 39.0–52.0)
Hemoglobin: 12.2 g/dL — ABNORMAL LOW (ref 13.0–17.0)
MCH: 28.1 pg (ref 26.0–34.0)
MCHC: 32.3 g/dL (ref 30.0–36.0)
MCV: 87.1 fL (ref 80.0–100.0)
Platelets: 239 10*3/uL (ref 150–400)
RBC: 4.34 MIL/uL (ref 4.22–5.81)
RDW: 14 % (ref 11.5–15.5)
WBC: 7 10*3/uL (ref 4.0–10.5)
nRBC: 0 % (ref 0.0–0.2)

## 2023-09-27 LAB — URINALYSIS, COMPLETE (UACMP) WITH MICROSCOPIC
Bacteria, UA: NONE SEEN
Bilirubin Urine: NEGATIVE
Glucose, UA: NEGATIVE mg/dL
Hgb urine dipstick: NEGATIVE
Ketones, ur: NEGATIVE mg/dL
Leukocytes,Ua: NEGATIVE
Nitrite: NEGATIVE
Protein, ur: NEGATIVE mg/dL
Specific Gravity, Urine: 1.012 (ref 1.005–1.030)
pH: 6 (ref 5.0–8.0)

## 2023-09-27 LAB — COMPREHENSIVE METABOLIC PANEL WITH GFR
ALT: 16 U/L (ref 0–44)
AST: 21 U/L (ref 15–41)
Albumin: 2.6 g/dL — ABNORMAL LOW (ref 3.5–5.0)
Alkaline Phosphatase: 58 U/L (ref 38–126)
Anion gap: 9 (ref 5–15)
BUN: 15 mg/dL (ref 6–20)
CO2: 26 mmol/L (ref 22–32)
Calcium: 8.5 mg/dL — ABNORMAL LOW (ref 8.9–10.3)
Chloride: 102 mmol/L (ref 98–111)
Creatinine, Ser: 1.18 mg/dL (ref 0.61–1.24)
GFR, Estimated: >60 mL/min (ref >60)
Glucose, Bld: 135 mg/dL — ABNORMAL HIGH (ref 70–99)
Potassium: 4 mmol/L (ref 3.5–5.1)
Sodium: 137 mmol/L (ref 135–145)
Total Bilirubin: 0.5 mg/dL (ref 0.0–1.2)
Total Protein: 5.4 g/dL — ABNORMAL LOW (ref 6.5–8.1)

## 2023-09-27 LAB — ECHOCARDIOGRAM COMPLETE
AR max vel: 2.63 cm2
AV Area VTI: 2.46 cm2
AV Area mean vel: 2.47 cm2
AV Mean grad: 4 mmHg
AV Peak grad: 6.9 mmHg
Ao pk vel: 1.31 m/s
Area-P 1/2: 4.01 cm2
Calc EF: 31.3 %
Height: 69 in
S' Lateral: 4.6 cm
Single Plane A2C EF: 27.3 %
Single Plane A4C EF: 35 %
Weight: 2560.86 [oz_av]

## 2023-09-27 LAB — GLUCOSE, CAPILLARY
Glucose-Capillary: 124 mg/dL — ABNORMAL HIGH (ref 70–99)
Glucose-Capillary: 134 mg/dL — ABNORMAL HIGH (ref 70–99)
Glucose-Capillary: 176 mg/dL — ABNORMAL HIGH (ref 70–99)

## 2023-09-27 LAB — HEMOGLOBIN A1C
Hgb A1c MFr Bld: 5.2 % (ref 4.8–5.6)
Mean Plasma Glucose: 102.54 mg/dL

## 2023-09-27 LAB — HIV ANTIBODY (ROUTINE TESTING W REFLEX): HIV Screen 4th Generation wRfx: NONREACTIVE

## 2023-09-27 MED ORDER — CLEVIDIPINE BUTYRATE 0.5 MG/ML IV EMUL
0.0000 mg/h | INTRAVENOUS | Status: DC
Start: 2023-09-27 — End: 2023-09-28

## 2023-09-27 MED ORDER — CLOPIDOGREL BISULFATE 75 MG PO TABS
75.0000 mg | ORAL_TABLET | Freq: Every day | ORAL | Status: DC
  Administered 2023-09-27 – 2023-09-28 (×2): 75 mg via ORAL
  Filled 2023-09-27 (×2): qty 1

## 2023-09-27 MED ORDER — PERFLUTREN LIPID MICROSPHERE
1.0000 mL | INTRAVENOUS | Status: DC | PRN
  Administered 2023-09-27: 4 mL via INTRAVENOUS

## 2023-09-27 MED ORDER — ASPIRIN 81 MG PO TBEC
81.0000 mg | DELAYED_RELEASE_TABLET | Freq: Every day | ORAL | Status: DC
  Administered 2023-09-28: 81 mg via ORAL
  Filled 2023-09-27: qty 1

## 2023-09-27 MED ORDER — ENOXAPARIN SODIUM 40 MG/0.4ML IJ SOSY: 40.0000 mg | PREFILLED_SYRINGE | INTRAMUSCULAR | Status: DC

## 2023-09-27 MED ORDER — ASPIRIN 300 MG RE SUPP
300.0000 mg | Freq: Once | RECTAL | Status: AC
  Filled 2023-09-27: qty 1

## 2023-09-27 MED ORDER — SODIUM CHLORIDE 0.9 % IV BOLUS: 250.0000 mL | INTRAVENOUS | Status: AC | PRN

## 2023-09-27 MED ORDER — NICOTINE 14 MG/24HR TD PT24
14.0000 mg | MEDICATED_PATCH | Freq: Every day | TRANSDERMAL | Status: DC
  Administered 2023-09-27 – 2023-09-28 (×2): 14 mg via TRANSDERMAL
  Filled 2023-09-27 (×2): qty 1

## 2023-09-27 MED ORDER — ENOXAPARIN SODIUM 40 MG/0.4ML IJ SOSY
40.0000 mg | PREFILLED_SYRINGE | INTRAMUSCULAR | Status: DC
  Administered 2023-09-27: 40 mg via SUBCUTANEOUS
  Filled 2023-09-27: qty 0.4

## 2023-09-27 MED ORDER — ATORVASTATIN CALCIUM 40 MG PO TABS
40.0000 mg | ORAL_TABLET | Freq: Every day | ORAL | Status: DC
  Administered 2023-09-28: 40 mg via ORAL
  Filled 2023-09-27: qty 1

## 2023-09-27 MED ORDER — ASPIRIN 81 MG PO TBEC
81.0000 mg | DELAYED_RELEASE_TABLET | Freq: Once | ORAL | Status: AC
  Administered 2023-09-27: 81 mg via ORAL
  Filled 2023-09-27: qty 1

## 2023-09-27 NOTE — Progress Notes (Signed)
 NIR Progress Note:  POD#1 s/p left M2 thrombectomy  Acute ischemic stroke: Small infarct in left MCA territory on MRI shown below Routine postop care, optimize medical therapy, smoking cessation No activity restriction from femoral access standpoint Continue investigate potential etiologies (this is patient's second stroke therapy) Appreciate excellent multidisciplinary care Please call with questions or concerns  Subjective: Hearing much improved (slightly diminished on the left).  Motor function near baseline.  Tolerated a diet this morning. No pain.  Objective: Physical Exam:  Awake, alert, and answers questions appropriately Left facial droop, mild Nonlabored respirations RRR Soft, nondistended Right femoral access site clean Equal strength bilaterally  Area at risk on CT 4/25:   Post treatment MRI 4/25:                Current Facility-Administered Medications:     stroke: early stages of recovery book, , Does not apply, Once, Victorine Grater, Stevi W, NP   0.9 %  sodium chloride  infusion, , Intravenous, Continuous, Toberman, Stevi W, NP   acetaminophen  (TYLENOL ) tablet 650 mg, 650 mg, Oral, Q4H PRN, 650 mg at 09/26/23 1831 **OR** acetaminophen  (TYLENOL ) 160 MG/5ML solution 650 mg, 650 mg, Per Tube, Q4H PRN **OR** acetaminophen  (TYLENOL ) suppository 650 mg, 650 mg, Rectal, Q4H PRN, Toberman, Stevi W, NP   aspirin  suppository 300 mg, 300 mg, Rectal, Once **OR** aspirin  EC tablet 81 mg, 81 mg, Oral, Once, Consuelo Denmark, MD   clevidipine  (CLEVIPREX ) infusion 0.5 mg/mL, 0-21 mg/hr, Intravenous, Continuous, Reagan Camera, MD   senna-docusate (Senokot-S) tablet 1 tablet, 1 tablet, Oral, QHS PRN, Louretta Royals, NP   sodium chloride  0.9 % bolus 250 mL, 250 mL, Intravenous, PRN, Reagan Camera, MD  Labs: CBC Recent Labs    09/26/23 0722 09/26/23 0726 09/27/23 0359  WBC 4.8  --  7.0  HGB 13.1 14.6 12.2*  HCT 41.9 43.0 37.8*  PLT 226  --  239   BMET Recent Labs     09/26/23 0722 09/26/23 0726 09/27/23 0359  NA 139 140 137  K 4.2 4.1 4.0  CL 108 107 102  CO2 26  --  26  GLUCOSE 97 92 135*  BUN 11 13 15   CREATININE 0.97 1.00 1.18  CALCIUM  8.8*  --  8.5*   LFT Recent Labs    09/27/23 0359  PROT 5.4*  ALBUMIN 2.6*  AST 21  ALT 16  ALKPHOS 58  BILITOT 0.5   PT/INR Recent Labs    09/26/23 0722  LABPROT 13.3  INR 1.0     LOS: 1 day   I spent a total of 15 minutes in face to face in clinical consultation, greater than 50% of which was counseling/coordinating care.  Reagan Camera 09/27/2023 8:10 AM

## 2023-09-27 NOTE — Plan of Care (Signed)
  Problem: Education: Goal: Knowledge of disease or condition will improve Outcome: Not Progressing Goal: Knowledge of secondary prevention will improve (MUST DOCUMENT ALL) Outcome: Not Progressing Goal: Knowledge of patient specific risk factors will improve (DELETE if not current risk factor) Outcome: Not Progressing   Problem: Ischemic Stroke/TIA Tissue Perfusion: Goal: Complications of ischemic stroke/TIA will be minimized Outcome: Not Progressing   Problem: Coping: Goal: Will verbalize positive feelings about self Outcome: Not Progressing Goal: Will identify appropriate support needs Outcome: Not Progressing   Problem: Health Behavior/Discharge Planning: Goal: Ability to manage health-related needs will improve Outcome: Not Progressing Goal: Goals will be collaboratively established with patient/family Outcome: Not Progressing   Problem: Self-Care: Goal: Ability to participate in self-care as condition permits will improve Outcome: Not Progressing Goal: Verbalization of feelings and concerns over difficulty with self-care will improve Outcome: Not Progressing Goal: Ability to communicate needs accurately will improve Outcome: Not Progressing   Problem: Nutrition: Goal: Risk of aspiration will decrease Outcome: Not Progressing Goal: Dietary intake will improve Outcome: Not Progressing   Problem: Education: Goal: Knowledge of General Education information will improve Description: Including pain rating scale, medication(s)/side effects and non-pharmacologic comfort measures Outcome: Not Progressing   Problem: Health Behavior/Discharge Planning: Goal: Ability to manage health-related needs will improve Outcome: Not Progressing   Problem: Clinical Measurements: Goal: Ability to maintain clinical measurements within normal limits will improve Outcome: Not Progressing Goal: Will remain free from infection Outcome: Not Progressing Goal: Diagnostic test results will  improve Outcome: Not Progressing Goal: Respiratory complications will improve Outcome: Not Progressing Goal: Cardiovascular complication will be avoided Outcome: Not Progressing   Problem: Activity: Goal: Risk for activity intolerance will decrease Outcome: Not Progressing   Problem: Nutrition: Goal: Adequate nutrition will be maintained Outcome: Not Progressing   Problem: Coping: Goal: Level of anxiety will decrease Outcome: Not Progressing   Problem: Elimination: Goal: Will not experience complications related to bowel motility Outcome: Not Progressing Goal: Will not experience complications related to urinary retention Outcome: Not Progressing   Problem: Pain Managment: Goal: General experience of comfort will improve and/or be controlled Outcome: Not Progressing   Problem: Safety: Goal: Ability to remain free from injury will improve Outcome: Not Progressing   Problem: Skin Integrity: Goal: Risk for impaired skin integrity will decrease Outcome: Not Progressing   Problem: Education: Goal: Understanding of CV disease, CV risk reduction, and recovery process will improve Outcome: Not Progressing Goal: Individualized Educational Video(s) Outcome: Not Progressing   Problem: Activity: Goal: Ability to return to baseline activity level will improve Outcome: Not Progressing   Problem: Cardiovascular: Goal: Ability to achieve and maintain adequate cardiovascular perfusion will improve Outcome: Not Progressing Goal: Vascular access site(s) Level 0-1 will be maintained Outcome: Not Progressing   Problem: Health Behavior/Discharge Planning: Goal: Ability to safely manage health-related needs after discharge will improve Outcome: Not Progressing

## 2023-09-27 NOTE — Plan of Care (Signed)

## 2023-09-27 NOTE — Progress Notes (Signed)
  Echocardiogram 2D Echocardiogram has been performed.  Annis Kinder, RDCS 09/27/2023, 10:53 AM

## 2023-09-27 NOTE — Evaluation (Signed)
 Physical Therapy Evaluation/ Discharge Patient Details Name: Mark Herrera MRN: [id number] DOB: Jan 18, 1961 Today's Date: 09/27/2023  History of Present Illness  63 y/o male admitted 09/26/23 with speech deficit and right-sided weakness with Lt MCA CVA s/p thrombectomy same date. 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 Pt with mild Rt hip flexor weakness which is baseline but all other strength WFL with sensation intact. Pt at baseline without further therapy needs at this time, will sign off.         If plan is discharge home, recommend the following:     Can travel by private vehicle        Equipment Recommendations None recommended by PT  Recommendations for Other Services       Functional Status Assessment Patient has not had a recent decline in their functional status     Precautions / Restrictions Precautions Precautions: None      Mobility  Bed Mobility Overal bed mobility: Modified Independent                  Transfers Overall transfer level: Modified independent                 General transfer comment: increased time to fully extend trunk without physical assist    Ambulation/Gait Ambulation/Gait assistance: Independent Gait Distance (Feet): 250 Feet Assistive device: None Gait Pattern/deviations: Step-through pattern   Gait velocity interpretation: >2.62 ft/sec, indicative of community ambulatory   General Gait Details: pt with decreased hip flexion RLE and slight limp but pt reports baseline, steady gait including head turns and change of direction  Stairs Stairs: Yes Stairs assistance: Modified independent (Device/Increase time) Stair Management: One rail Right, Alternating pattern, Forwards Number of Stairs: 11    Wheelchair Mobility     Tilt Bed    Modified  Rankin (Stroke Patients Only) Modified Rankin (Stroke Patients Only) Pre-Morbid Rankin Score: No significant disability Modified Rankin: No significant disability     Balance Overall balance assessment: Mild deficits observed, not formally tested                                           Pertinent Vitals/Pain Pain Assessment Pain Assessment: No/denies pain    Home Living Family/patient expects to be discharged to:: Private residence Living Arrangements: Children Available Help at Discharge: Family;Available 24 hours/day Type of Home: House Home Access: Level entry       Home Layout: One level Home Equipment: Grab bars - tub/shower      Prior Function Prior Level of Function : Independent/Modified Independent;Driving;Working/employed                     Extremity/Trunk Assessment   Upper Extremity Assessment Upper Extremity Assessment: Overall WFL for tasks assessed    Lower Extremity Assessment Lower Extremity Assessment: RLE deficits/detail RLE Deficits / Details: hip flexor 3/5 all other MMT 5/5, sensation intact    Cervical / Trunk Assessment Cervical / Trunk Assessment: Normal  Communication   Communication Communication: No apparent difficulties    Cognition Arousal: Alert Behavior During Therapy: WFL for tasks assessed/performed   PT - Cognitive impairments: No apparent impairments  Following commands: Intact       Cueing Cueing Techniques: Verbal cues     General Comments      Exercises     Assessment/Plan    PT Assessment Patient does not need any further PT services  PT Problem List         PT Treatment Interventions      PT Goals (Current goals can be found in the Care Plan section)  Acute Rehab PT Goals PT Goal Formulation: All assessment and education complete, DC therapy    Frequency       Co-evaluation               AM-PAC PT "6 Clicks" Mobility   Outcome Measure Help needed turning from your back to your side while in a flat bed without using bedrails?: None Help needed moving from lying on your back to sitting on the side of a flat bed without using bedrails?: None Help needed moving to and from a bed to a chair (including a wheelchair)?: None Help needed standing up from a chair using your arms (e.g., wheelchair or bedside chair)?: None Help needed to walk in hospital room?: None Help needed climbing 3-5 steps with a railing? : None 6 Click Score: 24    End of Session   Activity Tolerance: Patient tolerated treatment well Patient left: in chair;with call bell/phone within reach Nurse Communication: Mobility status PT Visit Diagnosis: Other abnormalities of gait and mobility (R26.89)    Time: 1610-9604 PT Time Calculation (min) (ACUTE ONLY): 21 min   Charges:   PT Evaluation $PT Eval Low Complexity: 1 Low   PT General Charges $$ ACUTE PT VISIT: 1 Visit         Annis Baseman, PT Acute Rehabilitation Services Office: [phone]   Luwana Salvo 09/27/2023, 8:53 AM

## 2023-09-27 NOTE — Progress Notes (Addendum)
 STROKE TEAM PROGRESS NOTE    SIGNIFICANT HOSPITAL EVENTS  4/25: EMS-activated CODE STROKE d/t speech deficit, R side weakness, global aphasia, dysarthria.   IR: emergent Left MCA thrombectomy MRI has shown multiple insular and cortical infarcts, remote R MCA infarct  INTERIM HISTORY/SUBJECTIVE  On exam, patient sitting up in chair. RN at bedside.  No focal neurological deficits seen.  Assessment, plan of care and follow-up needs thoroughly discussed with patient. This is his 4th stroke intervention since 2020 (TNK x2, Thrombectomy x2).   Patient endorses frequent use of cocaine, daily smoking/marihuana use and alcohol. Thoroughly discussed secondary stroke prevention at bedside, emphasized medication compliance (which patient admitted to during conversation), stopping smoking, alcohol and substance use.    Transfer out of ICU today.    OBJECTIVE  CBC    Component Value Date/Time   WBC 7.0 09/27/2023 0359   RBC 4.34 09/27/2023 0359   HGB 12.2 (L) 09/27/2023 0359   HCT 37.8 (L) 09/27/2023 0359   PLT 239 09/27/2023 0359   MCV 87.1 09/27/2023 0359   MCH 28.1 09/27/2023 0359   MCHC 32.3 09/27/2023 0359   RDW 14.0 09/27/2023 0359   LYMPHSABS 1.2 09/26/2023 0722   MONOABS 0.4 09/26/2023 0722   EOSABS 0.3 09/26/2023 0722   BASOSABS 0.0 09/26/2023 0722    BMET    Component Value Date/Time   NA 137 09/27/2023 0359   K 4.0 09/27/2023 0359   CL 102 09/27/2023 0359   CO2 26 09/27/2023 0359   GLUCOSE 135 (H) 09/27/2023 0359   BUN 15 09/27/2023 0359   CREATININE 1.18 09/27/2023 0359   CALCIUM  8.5 (L) 09/27/2023 0359   GFRNONAA >60 09/27/2023 0359    IMAGING past 24 hours MR BRAIN WO CONTRAST Result Date: 09/26/2023 CLINICAL DATA:  Neuro deficit, concern for stroke. EXAM: MRI HEAD WITHOUT CONTRAST TECHNIQUE: Multiplanar, multiecho pulse sequences of the brain and surrounding structures were obtained without intravenous contrast. COMPARISON:  Earlier same day CT head.  FINDINGS: Brain: Restricted diffusion involving the left insular cortex with additional small scattered foci of restricted diffusion involving the cortex of the left frontal operculum and the posterosuperior left temporal lobe. Additional small foci of restricted diffusion involving the cortex of the left middle frontal gyrus. No evidence of acute intracranial hemorrhage. Redemonstrated encephalomalacia and gliosis in the right parietal lobe extending into the posterosuperior right temporal lobe as well as the posterior right insula compatible with remote infarct. Associated areas of susceptibility in this region suggestive of prior hemorrhage. Scattered areas of FLAIR signal abnormality in the periventricular and subcortical white matter suggestive of chronic microvascular ischemic changes with similar signal abnormality in the pons. No midline shift. The basilar cisterns are patent. No extra-axial fluid collections. Ventricles: Ex vacuo dilatation of the right lateral ventricle. No hydrocephalus. Vascular: Skull base flow voids are visualized. Skull and upper cervical spine: Degenerative changes in the upper cervical spine. Trace retrolisthesis of C3 on C4. The visualized calvarium is unremarkable. Sinuses/Orbits: Orbits are symmetric. Paranasal sinuses are clear. Other: Mastoid air cells are clear. IMPRESSION: Acute infarcts involving the left insular cortex as well as additional scattered acute cortical infarcts involving the left frontal operculum, posterior left temporal lobe, and left middle frontal gyrus. No significant edema or midline shift. Redemonstrated remote right MCA territory infarct. Mild-to-moderate chronic microvascular ischemic changes. Electronically Signed   By: Denny Flack M.D.   On: 09/26/2023 21:19    Vitals:   09/27/23 1000 09/27/23 1030 09/27/23 1100 09/27/23 1122  BP: 114/77  114/67 101/67 94/83  Pulse: 63 (!) 55 (!) 55 (!) 57  Resp: 20   18  Temp:    98.2 F (36.8 C)   TempSrc:    Oral  SpO2: 100% 100% 100% 100%  Weight:      Height:         PHYSICAL EXAM General:  Alert, well-nourished, well-developed patient in no acute distress Psych:  Mood and affect appropriate for situation CV: Regular rate and rhythm on monitor Respiratory:  Regular, unlabored respirations on room air  NEURO:  Mental Status: AA&Ox3, patient is able to give clear and coherent history Speech/Language: speech is without dysarthria or aphasia.  Naming, repetition, fluency, and comprehension intact.  Cranial Nerves:  II: PERRL. Visual fields full.  III, IV, VI: EOMI. Eyelids elevate symmetrically.  V: Sensation is intact to light touch and symmetrical to face.  VII: Face is symmetrical resting and smiling VIII: hearing intact to voice. IX, X: Palate elevates symmetrically. Phonation is normal.  ZO:[id number] shrug 5/5. XII: tongue is midline without fasciculations. Motor:  4/5 LLE at baseline Baseline right hip decreased ROM due to pain.   Tone: is normal and bulk is normal Sensation- Intact to light touch bilaterally. Extinction absent to light touch to DSS.   Coordination: FTN intact bilaterally, HKS: no ataxia in BLE.No drift.  Gait- deferred  Most Recent NIH: 1    ASSESSMENT/PLAN  Mark Herrera is a 63 y.o. 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  A code stroke was activated on arrival and patient was taken to CT where imaging revealed an emergent LVO at the left M1/M2 junction with extensive penumbra and patient was taken to IR for thrombectomy.   Stroke:  left insular cortex infarcts and small left temporal and temporal infarcts with left M1/M2 occlusion s/p IR with TICI2c, etiology secondary to persistent substance abuse Code  Stroke CT head Large remote right MCA territory infarction. ASPECTS 9 CTA head & neck Abrupt cut off at the left M1 M2 junction on the right, there is diminished flow beginning at proximal right M2 level correlating to chronic infarction CT perfusion 0/68 cc S/p IR with TICI2c reperfusion Post IR CT: no hemorrhage MRI  Acute infarcts involving the left insular cortex as well as additional scattered acute cortical infarcts involving the left frontal operculum, posterior left temporal lobe, and left middle frontal gyrus. Redemonstrated remote right MCA territory infarct. 2D Echo:  EF 30-35% with mildly dilated LA Recommend 30-day cardiac event monitor as outpt LDL 105 HgbA1c 5.2 UDS positive for cocaine VTE prophylaxis - lovenox added No antithrombotic prior to admission, now on Aspirin  and Plavix  for 3 weeks and then aspirin  alone. Therapy recommendations:  No follow up needed  Disposition:  home tomorrow  Hx of Stroke/TIA 05/2019: Admitted for right side weakness.  Received tPA.  MRI negative.  Echo showed EF of 40 to 45%. LDL 99, and A1C 5.4. UDS cocaine and THC positive.  Discharged on DAPT and Lipitor  40.  At this time patient endorsed noncompliance with his medications 05/2020: Admitted for right MCA infarct with right M1 occlusion status post IR with TICI2c. EF 25-30%, LDL 84, A1C 5.4 and UDS positive for cocaine and THC. Discharged on DAPT for 3 weeks and lipitor  80. Recommend 30 day cardiac monitoring as  outpt  09/2021: Admitted for dysarthria, left facial droop, left-sided weakness, status post TNK administration.  Echo showed EF of 30 to 35%.  MRI shows acute right MCA branch infarct. CTA  head and  neck showed R M2 occlusion. LDL 105 and A1C 5.3. UDS positive for cocaine and THC. Discharged on DAPT for 3 months, then asa alone as well as lipitor  40. Patient endorses medication non-compliance  CHF CAD and MI 2018 s/p stenting Acute anterior STEMI 2018 Total occlusion of proximal LAD,  heavy thrombus S/P successful PTCA/DES x 1 to proximal LAD Placed on aspirin , Brilinta , Coreg , lisinopril , statin 09/2021 EF 30-35% This admission 2D Echo EF 30-35%  Stable, continue outpt follow up with cardiology Now on DAPT  Hypertension Home meds:  none Stable, on lower end Long term BP goal normotensive  Hyperlipidemia Home meds:  Lipitor  40mg , noncompliant  LDL 105, goal < 70 Increase to Lipitor  80mg  Continue statin at discharge  Tobacco Abuse Patient is a current smoker      Ready to quit? No Nicotine  replacement therapy provided  Substance Abuse Patient has a history of cocaine use UDS positive for cocaine this time and on all previous stroke intervention admissions      Ready to quit? No. Patient states that he knows he "should cut down some" TOC consult for cessation placed  Other stroke risk factors Alcohol use, recommend no more than one drink per day  Other Active Problems Medication noncompliance Thorough education provided at bedside.   Hospital day # 1   Pt seen by Neuro NP/APP and later by MD. Note/plan to be edited by MD as needed.    Audrene Lease, DNP, AGACNP-BC [facility] Please use AMION for contact information & EPIC for messaging.  ATTENDING NOTE: I reviewed above note and agree with the assessment and plan. Pt was seen and examined.   RN is at the bedside. Pt neuro intact except chronic RLE 4+/5 proximal and distally. He again admitted using cocaine and continued smoking. This is  his 4th stroke admission with positive cocaine and has been received either thrombolytics or IR. Stroke risk factor modification education provided. Will put on DAPT and statin. Transfer out of ICU and PT OT no recs.  For detailed assessment and plan, please refer to above as I have made changes wherever appropriate.   Consuelo Denmark, MD PhD Stroke Neurology 09/27/2023 9:35 PM  This patient is critically ill due to left MCA stroke s/p IR, hx of  stroke, cocaine abuse, smoker and at significant risk of neurological worsening, death form recurrent stroke, hemorrhagic conversion, cocaine withdraw. This patient's care requires constant monitoring of vital signs, hemodynamics, respiratory and cardiac monitoring, review of multiple databases, neurological assessment, discussion with family, other specialists and medical decision making of high complexity. I spent 35 minutes of neurocritical care time in the care of this patient.    To contact Stroke Continuity provider, please refer to [email or web address]. After hours, contact General Neurology

## 2023-09-27 NOTE — Plan of Care (Deleted)

## 2023-09-27 NOTE — Progress Notes (Signed)
 OT Cancellation Note  Patient Details Name: Mark Herrera MRN: [id number] DOB: 1960/11/15   Cancelled Treatment:    Reason Eval/Treat Not Completed: OT screened, no needs identified, will sign off (per discussion with PT, pt with no acute OT needs at this time, will screen. Please reconsult if there is a change in pt status.)  Dakarri Kessinger K, OTD, OTR/L [employer] [facility] Acute Rehab [phone]   Antionette Kirks 09/27/2023, 9:14 AM

## 2023-09-28 DIAGNOSIS — I6389 Other cerebral infarction: Secondary | ICD-10-CM | POA: Diagnosis not present

## 2023-09-28 DIAGNOSIS — R29701 NIHSS score 1: Secondary | ICD-10-CM | POA: Diagnosis not present

## 2023-09-28 LAB — BASIC METABOLIC PANEL WITH GFR
Anion gap: 8 (ref 5–15)
BUN: 13 mg/dL (ref 6–20)
CO2: 27 mmol/L (ref 22–32)
Calcium: 8.8 mg/dL — ABNORMAL LOW (ref 8.9–10.3)
Chloride: 105 mmol/L (ref 98–111)
Creatinine, Ser: 0.95 mg/dL (ref 0.61–1.24)
GFR, Estimated: >60 mL/min (ref >60)
Glucose, Bld: 97 mg/dL (ref 70–99)
Potassium: 4.5 mmol/L (ref 3.5–5.1)
Sodium: 140 mmol/L (ref 135–145)

## 2023-09-28 LAB — CBC
HCT: 41.6 % (ref 39.0–52.0)
Hemoglobin: 13.2 g/dL (ref 13.0–17.0)
MCH: 28.1 pg (ref 26.0–34.0)
MCHC: 31.7 g/dL (ref 30.0–36.0)
MCV: 88.5 fL (ref 80.0–100.0)
Platelets: 233 10*3/uL (ref 150–400)
RBC: 4.7 MIL/uL (ref 4.22–5.81)
RDW: 14.2 % (ref 11.5–15.5)
WBC: 4.1 10*3/uL (ref 4.0–10.5)
nRBC: 0 % (ref 0.0–0.2)

## 2023-09-28 MED ORDER — ATORVASTATIN CALCIUM 40 MG PO TABS: 40.0000 mg | ORAL_TABLET | Freq: Every day | ORAL | 0 refills | Status: DC

## 2023-09-28 MED ORDER — CLOPIDOGREL BISULFATE 75 MG PO TABS: 75.0000 mg | ORAL_TABLET | Freq: Every day | ORAL | 0 refills | Status: DC

## 2023-09-28 MED ORDER — NICOTINE 14 MG/24HR TD PT24: 14.0000 mg | MEDICATED_PATCH | Freq: Every day | TRANSDERMAL | 0 refills | Status: AC

## 2023-09-28 MED ORDER — ASPIRIN 81 MG PO TBEC: 81.0000 mg | DELAYED_RELEASE_TABLET | Freq: Every day | ORAL | 12 refills | Status: DC

## 2023-09-28 NOTE — Discharge Summary (Addendum)
 Stroke Discharge Summary  Patient ID: Mark Herrera   MRN: [id number]      DOB: [birth year]  Date of Admission: 09/26/2023 Date of Discharge: 09/28/2023  Attending Physician:  Stroke, Md, MD Patient's PCP:  Medicine, [facility]  DISCHARGE DIAGNOSIS:  Stroke:  left insular cortex infarcts and small left temporal and temporal infarcts with left M1/M2 occlusion s/p IR with TICI2c, etiology secondary to persistent substance abuse   Secondary diagnosis History of stroke CHF CAD/MI status post stenting Hypertension Lipidemia Smoker Cocaine abuse  Allergies as of 09/28/2023   No Known Allergies      Medication List     TAKE these medications    aspirin  EC 81 MG tablet Take 1 tablet (81 mg total) by mouth daily. Swallow whole.   atorvastatin  40 MG tablet Commonly known as: LIPITOR  Take 1 tablet (40 mg total) by mouth daily.   clopidogrel  75 MG tablet Commonly known as: PLAVIX  Take 1 tablet (75 mg total) by mouth daily.   nicotine  14 mg/24hr patch Commonly known as: NICODERM CQ  - dosed in mg/24 hours Place 1 patch (14 mg total) onto the skin daily.        LABORATORY STUDIES CBC    Component Value Date/Time   WBC 4.1 09/28/2023 0727   RBC 4.70 09/28/2023 0727   HGB 13.2 09/28/2023 0727   HCT 41.6 09/28/2023 0727   PLT 233 09/28/2023 0727   MCV 88.5 09/28/2023 0727   MCH 28.1 09/28/2023 0727   MCHC 31.7 09/28/2023 0727   RDW 14.2 09/28/2023 0727   LYMPHSABS 1.2 09/26/2023 0722   MONOABS 0.4 09/26/2023 0722   EOSABS 0.3 09/26/2023 0722   BASOSABS 0.0 09/26/2023 0722   CMP    Component Value Date/Time   NA 140 09/28/2023 0727   K 4.5 09/28/2023 0727   CL 105 09/28/2023 0727   CO2 27 09/28/2023 0727   GLUCOSE 97 09/28/2023 0727   BUN 13 09/28/2023 0727   CREATININE 0.95 09/28/2023 0727   CALCIUM  8.8 (L) 09/28/2023 0727   PROT 5.4 (L) 09/27/2023 0359   ALBUMIN 2.6 (L) 09/27/2023 0359   AST 21 09/27/2023 0359   ALT 16 09/27/2023  0359   ALKPHOS 58 09/27/2023 0359   BILITOT 0.5 09/27/2023 0359   GFRNONAA >60 09/28/2023 0727   GFRAA >60 06/01/2019 0553   COAGS Lab Results  Component Value Date   INR 1.0 09/26/2023   INR 1.1 11/11/2022   INR 1.1 09/05/2021   Lipid Panel    Component Value Date/Time   CHOL 164 09/27/2023 0359   TRIG 33 09/27/2023 0359   HDL 52 09/27/2023 0359   CHOLHDL 3.2 09/27/2023 0359   VLDL 7 09/27/2023 0359   LDLCALC 105 (H) 09/27/2023 0359   HgbA1C  Lab Results  Component Value Date   HGBA1C 5.2 09/27/2023   Urinalysis    Component Value Date/Time   COLORURINE YELLOW 09/27/2023 1029   APPEARANCEUR CLEAR 09/27/2023 1029   LABSPEC 1.012 09/27/2023 1029   PHURINE 6.0 09/27/2023 1029   GLUCOSEU NEGATIVE 09/27/2023 1029   HGBUR NEGATIVE 09/27/2023 1029   BILIRUBINUR NEGATIVE 09/27/2023 1029   KETONESUR NEGATIVE 09/27/2023 1029   PROTEINUR NEGATIVE 09/27/2023 1029   NITRITE NEGATIVE 09/27/2023 1029   LEUKOCYTESUR NEGATIVE 09/27/2023 1029   Urine Drug Screen     Component Value Date/Time   LABOPIA NONE DETECTED 09/27/2023 1029   COCAINSCRNUR POSITIVE (A) 09/27/2023 1029   LABBENZ NONE DETECTED 09/27/2023 1029  AMPHETMU NONE DETECTED 09/27/2023 1029   THCU NONE DETECTED 09/27/2023 1029   LABBARB NONE DETECTED 09/27/2023 1029    Alcohol Level    Component Value Date/Time   [facility] <15 09/26/2023 1610     SIGNIFICANT DIAGNOSTIC STUDIES ECHOCARDIOGRAM COMPLETE Result Date: 09/27/2023    ECHOCARDIOGRAM REPORT   Patient Name:   DOMINIQ Herrera Date of Exam: 09/27/2023 Medical Rec #:  [id number]         Height:       69.0 in Accession #:    [id number]        Weight:       160.1 lb Date of Birth:  December 06, 1960          BSA:          1.879 m Patient Age:    63 years          BP:           89/51 mmHg Patient Gender: M                 HR:           55 bpm. Exam Location:  Inpatient Procedure: 2D Echo, Cardiac Doppler and Color Doppler (Both Spectral and Color            Flow Doppler  were utilized during procedure). Indications:    Stroke  History:        Patient has prior history of Echocardiogram examinations, most                 recent 09/06/2021. CAD, Stroke; Risk Factors:Hypertension.  Sonographer:    Andrena Bang Referring Phys: Genevieve Kerry [facility] IMPRESSIONS  1. Left ventricular ejection fraction, by estimation, is 30 to 35%. The left ventricle has moderately decreased function. The left ventricle demonstrates regional wall motion abnormalities (see scoring diagram/findings for description). The left ventricular internal cavity size was mildly dilated. Left ventricular diastolic parameters are consistent with Grade I diastolic dysfunction (impaired relaxation).  2. Right ventricular systolic function is normal. The right ventricular size is normal.  3. The mitral valve is normal in structure. Mild mitral valve regurgitation. No evidence of mitral stenosis.  4. The aortic valve is normal in structure. Aortic valve regurgitation is not visualized. No aortic stenosis is present.  5. The inferior vena cava is normal in size with greater than 50% respiratory variability, suggesting right atrial pressure of 3 mmHg. Conclusion(s)/Recommendation(s): No intracardiac source of embolism detected on this transthoracic study. Consider a transesophageal echocardiogram to exclude cardiac source of embolism if clinically indicated. FINDINGS  Left Ventricle: Left ventricular ejection fraction, by estimation, is 30 to 35%. The left ventricle has moderately decreased function. The left ventricle demonstrates regional wall motion abnormalities. Definity  contrast agent was given IV to delineate the left ventricular endocardial borders. The left ventricular internal cavity size was mildly dilated. There is no left ventricular hypertrophy. Left ventricular diastolic parameters are consistent with Grade I diastolic dysfunction (impaired relaxation).  LV Wall Scoring: The apical lateral segment, apical septal segment,  and apex are akinetic. Right Ventricle: The right ventricular size is normal. No increase in right ventricular wall thickness. Right ventricular systolic function is normal. Left Atrium: Left atrial size was normal in size. Right Atrium: Right atrial size was normal in size. Pericardium: There is no evidence of pericardial effusion. Mitral Valve: The mitral valve is normal in structure. Mild mitral valve regurgitation. No evidence of mitral valve stenosis. Tricuspid Valve: The tricuspid valve is normal  in structure. Tricuspid valve regurgitation is trivial. No evidence of tricuspid stenosis. Aortic Valve: The aortic valve is normal in structure. Aortic valve regurgitation is not visualized. No aortic stenosis is present. Aortic valve mean gradient measures 4.0 mmHg. Aortic valve peak gradient measures 6.9 mmHg. Aortic valve area, by VTI measures 2.46 cm. Pulmonic Valve: The pulmonic valve was normal in structure. Pulmonic valve regurgitation is not visualized. No evidence of pulmonic stenosis. Aorta: The aortic root is normal in size and structure. Venous: The inferior vena cava is normal in size with greater than 50% respiratory variability, suggesting right atrial pressure of 3 mmHg. IAS/Shunts: No atrial level shunt detected by color flow Doppler.  LEFT VENTRICLE PLAX 2D LVIDd:         5.80 cm      Diastology LVIDs:         4.60 cm      LV e' medial:    6.20 cm/s LV PW:         1.30 cm      LV E/e' medial:  7.1 LV IVS:        0.90 cm      LV e' lateral:   6.74 cm/s LVOT diam:     2.20 cm      LV E/e' lateral: 6.6 LV SV:         59 LV SV Index:   31 LVOT Area:     3.80 cm  LV Volumes (MOD) LV vol d, MOD A2C: 242.0 ml LV vol d, MOD A4C: 214.0 ml LV vol s, MOD A2C: 176.0 ml LV vol s, MOD A4C: 139.0 ml LV SV MOD A2C:     66.0 ml LV SV MOD A4C:     214.0 ml LV SV MOD BP:      71.5 ml RIGHT VENTRICLE RV S prime:     12.00 cm/s TAPSE (M-mode): 2.6 cm LEFT ATRIUM             Index LA diam:        4.90 cm 2.61 cm/m LA  Vol (A2C):   80.8 ml 42.99 ml/m LA Vol (A4C):   55.6 ml 29.58 ml/m LA Biplane Vol: 67.3 ml 35.81 ml/m  AORTIC VALVE AV Area (Vmax):    2.63 cm AV Area (Vmean):   2.47 cm AV Area (VTI):     2.46 cm AV Vmax:           131.00 cm/s AV Vmean:          89.300 cm/s AV VTI:            0.238 m AV Peak Grad:      6.9 mmHg AV Mean Grad:      4.0 mmHg LVOT Vmax:         90.80 cm/s LVOT Vmean:        58.100 cm/s LVOT VTI:          0.154 m LVOT/AV VTI ratio: 0.65  AORTA Ao Asc diam: 3.40 cm MITRAL VALVE MV Area (PHT): 4.01 cm    SHUNTS MV Decel Time: 189 msec    Systemic VTI:  0.15 m MV E velocity: 44.15 cm/s  Systemic Diam: 2.20 cm MV A velocity: 39.50 cm/s MV E/A ratio:  1.12 Dorothye Gathers MD Electronically signed by Dorothye Gathers MD Signature Date/Time: 09/27/2023/1:10:31 PM    Final    MR BRAIN WO CONTRAST Result Date: 09/26/2023 CLINICAL DATA:  Neuro deficit, concern for stroke. EXAM: MRI HEAD WITHOUT  CONTRAST TECHNIQUE: Multiplanar, multiecho pulse sequences of the brain and surrounding structures were obtained without intravenous contrast. COMPARISON:  Earlier same day CT head. FINDINGS: Brain: Restricted diffusion involving the left insular cortex with additional small scattered foci of restricted diffusion involving the cortex of the left frontal operculum and the posterosuperior left temporal lobe. Additional small foci of restricted diffusion involving the cortex of the left middle frontal gyrus. No evidence of acute intracranial hemorrhage. Redemonstrated encephalomalacia and gliosis in the right parietal lobe extending into the posterosuperior right temporal lobe as well as the posterior right insula compatible with remote infarct. Associated areas of susceptibility in this region suggestive of prior hemorrhage. Scattered areas of FLAIR signal abnormality in the periventricular and subcortical white matter suggestive of chronic microvascular ischemic changes with similar signal abnormality in the pons. No  midline shift. The basilar cisterns are patent. No extra-axial fluid collections. Ventricles: Ex vacuo dilatation of the right lateral ventricle. No hydrocephalus. Vascular: Skull base flow voids are visualized. Skull and upper cervical spine: Degenerative changes in the upper cervical spine. Trace retrolisthesis of C3 on C4. The visualized calvarium is unremarkable. Sinuses/Orbits: Orbits are symmetric. Paranasal sinuses are clear. Other: Mastoid air cells are clear. IMPRESSION: Acute infarcts involving the left insular cortex as well as additional scattered acute cortical infarcts involving the left frontal operculum, posterior left temporal lobe, and left middle frontal gyrus. No significant edema or midline shift. Redemonstrated remote right MCA territory infarct. Mild-to-moderate chronic microvascular ischemic changes. Electronically Signed   By: Denny Flack M.D.   On: 09/26/2023 21:19   IR PERCUTANEOUS ART THROMBECTOMY/INFUSION INTRACRANIAL INC DIAG ANGIO Result Date: 09/26/2023 PROCEDURE PERFORMED: 1. Stroke thrombectomy 2. Ultrasound vascular access 3. Cone beam CT for treatment planning COMPARISON:  CT perfusion performed September 26, 2023. CLINICAL DATA:  63 year old male with acute ischemic stroke present with symptoms of dysphagia, right-sided weakness, and hearing deficits. CT perfusion demonstrated a favorable profile for endovascular therapy and CTA imaging demonstrated a left M2 occlusion. INDICATION: Acute ischemic stroke. ANESTHESIA/SEDATION: General anesthesia was utilized for the procedure. CONTRAST:  Approximately 48 cc Omnipaque  300 MEDICATIONS: Heparin  3000 units intravenously FLUOROSCOPY TIME:  Fluoroscopy Time: 5 minutes and 12 seconds (480 mGy). COMPLICATIONS: None immediate. BODY OF REPORT: Following a full explanation of the procedure along with the potential associated complications, an informed witnessed consent was obtained. The patient was then placed under general anesthesia by the  Department of Anesthesiology at [facility]. The right groin was prepped and draped in the usual sterile fashion. Ultrasound was used to study the right common femoral artery which was patent. Using real-time ultrasound guidance, a 17 gauge introducer needle was used to access the right common femoral artery. Access was performed at 839 a.m. A hard copy image ultrasound the same date uncertain PACS. Using this access, a 6 French sheath was placed in the descending thoracic aorta. Approximately 3000 units of heparin  was administered intravenously. Next, selective catheterization the left common carotid artery was performed. A selective arteriogram was performed which demonstrated that the internal carotid artery was patent. There is no evidence of significant flow limiting stenosis in the proximal segment based on NASCET criteria. Next, selective catheterization of the left internal carotid artery was performed and a selective arteriogram was performed which demonstrated an inferior division left M2 occlusion. Next, a penumbra 43 and red 68 catheter was used to selectively catheterize the inferior division of the left M2 segment. Selective catheterization was achieved at 8:58 a.m. Suction thrombectomy was performed  at this site which yielded successful extraction of the embolus at 9:02 a.m. Recanalization was achieved at this time. Post treatment TICI score measured 2c. This was due to a central M3 branch occlusion which was separate from the recanalized territory. After reviewing the imaging I elected to terminate the procedure at this point. Evaluation of the right femoral access site demonstrated at this site was suitable for closure device. A 6 French Angio-Seal device was deployed without complication. Cone beam CT was then performed to evaluate for intracranial hemorrhage and treatment planning. This demonstrated no evidence significant acute intracranial hemorrhage. The patient was removed from  anesthesia and transferred to recovery in stable condition. FINDINGS: 1. Successful extraction of left inferior division M2 branch occlusion. 2. Small residual embolus within a central M3 branch identified on angiogram. 3. Post treatment TICI score 2c 4. No evidence of intracranial hemorrhage on cone beam CT. IMPRESSION: 1. Suction thrombectomy of left M2 occlusion. PLAN: 1. To ICU for routine postoperative supportive care. 2. Target SBP 120-160. 3. Postoperative femoral access site instructions entered into PACS. Electronically Signed   By: Reagan Camera M.D.   On: 09/26/2023 10:08   IR US  Guide Vasc Access Right Result Date: 09/26/2023 PROCEDURE PERFORMED: 1. Stroke thrombectomy 2. Ultrasound vascular access 3. Cone beam CT for treatment planning COMPARISON:  CT perfusion performed September 26, 2023. CLINICAL DATA:  63 year old male with acute ischemic stroke present with symptoms of dysphagia, right-sided weakness, and hearing deficits. CT perfusion demonstrated a favorable profile for endovascular therapy and CTA imaging demonstrated a left M2 occlusion. INDICATION: Acute ischemic stroke. ANESTHESIA/SEDATION: General anesthesia was utilized for the procedure. CONTRAST:  Approximately 48 cc Omnipaque  300 MEDICATIONS: Heparin  3000 units intravenously FLUOROSCOPY TIME:  Fluoroscopy Time: 5 minutes and 12 seconds (480 mGy). COMPLICATIONS: None immediate. BODY OF REPORT: Following a full explanation of the procedure along with the potential associated complications, an informed witnessed consent was obtained. The patient was then placed under general anesthesia by the Department of Anesthesiology at [facility]. The right groin was prepped and draped in the usual sterile fashion. Ultrasound was used to study the right common femoral artery which was patent. Using real-time ultrasound guidance, a 17 gauge introducer needle was used to access the right common femoral artery. Access was performed at 839 a.m. A  hard copy image ultrasound the same date uncertain PACS. Using this access, a 6 French sheath was placed in the descending thoracic aorta. Approximately 3000 units of heparin  was administered intravenously. Next, selective catheterization the left common carotid artery was performed. A selective arteriogram was performed which demonstrated that the internal carotid artery was patent. There is no evidence of significant flow limiting stenosis in the proximal segment based on NASCET criteria. Next, selective catheterization of the left internal carotid artery was performed and a selective arteriogram was performed which demonstrated an inferior division left M2 occlusion. Next, a penumbra 43 and red 68 catheter was used to selectively catheterize the inferior division of the left M2 segment. Selective catheterization was achieved at 8:58 a.m. Suction thrombectomy was performed at this site which yielded successful extraction of the embolus at 9:02 a.m. Recanalization was achieved at this time. Post treatment TICI score measured 2c. This was due to a central M3 branch occlusion which was separate from the recanalized territory. After reviewing the imaging I elected to terminate the procedure at this point. Evaluation of the right femoral access site demonstrated at this site was suitable for closure device. A 6 [place]  Angio-Seal device was deployed without complication. Cone beam CT was then performed to evaluate for intracranial hemorrhage and treatment planning. This demonstrated no evidence significant acute intracranial hemorrhage. The patient was removed from anesthesia and transferred to recovery in stable condition. FINDINGS: 1. Successful extraction of left inferior division M2 branch occlusion. 2. Small residual embolus within a central M3 branch identified on angiogram. 3. Post treatment TICI score 2c 4. No evidence of intracranial hemorrhage on cone beam CT. IMPRESSION: 1. Suction thrombectomy of left M2  occlusion. PLAN: 1. To ICU for routine postoperative supportive care. 2. Target SBP 120-160. 3. Postoperative femoral access site instructions entered into PACS. Electronically Signed   By: Reagan Camera M.D.   On: 09/26/2023 10:08   IR CT Head Ltd Result Date: 09/26/2023 PROCEDURE PERFORMED: 1. Stroke thrombectomy 2. Ultrasound vascular access 3. Cone beam CT for treatment planning COMPARISON:  CT perfusion performed September 26, 2023. CLINICAL DATA:  63 year old male with acute ischemic stroke present with symptoms of dysphagia, right-sided weakness, and hearing deficits. CT perfusion demonstrated a favorable profile for endovascular therapy and CTA imaging demonstrated a left M2 occlusion. INDICATION: Acute ischemic stroke. ANESTHESIA/SEDATION: General anesthesia was utilized for the procedure. CONTRAST:  Approximately 48 cc Omnipaque  300 MEDICATIONS: Heparin  3000 units intravenously FLUOROSCOPY TIME:  Fluoroscopy Time: 5 minutes and 12 seconds (480 mGy). COMPLICATIONS: None immediate. BODY OF REPORT: Following a full explanation of the procedure along with the potential associated complications, an informed witnessed consent was obtained. The patient was then placed under general anesthesia by the Department of Anesthesiology at [facility]. The right groin was prepped and draped in the usual sterile fashion. Ultrasound was used to study the right common femoral artery which was patent. Using real-time ultrasound guidance, a 17 gauge introducer needle was used to access the right common femoral artery. Access was performed at 839 a.m. A hard copy image ultrasound the same date uncertain PACS. Using this access, a 6 French sheath was placed in the descending thoracic aorta. Approximately 3000 units of heparin  was administered intravenously. Next, selective catheterization the left common carotid artery was performed. A selective arteriogram was performed which demonstrated that the internal carotid artery  was patent. There is no evidence of significant flow limiting stenosis in the proximal segment based on NASCET criteria. Next, selective catheterization of the left internal carotid artery was performed and a selective arteriogram was performed which demonstrated an inferior division left M2 occlusion. Next, a penumbra 43 and red 68 catheter was used to selectively catheterize the inferior division of the left M2 segment. Selective catheterization was achieved at 8:58 a.m. Suction thrombectomy was performed at this site which yielded successful extraction of the embolus at 9:02 a.m. Recanalization was achieved at this time. Post treatment TICI score measured 2c. This was due to a central M3 branch occlusion which was separate from the recanalized territory. After reviewing the imaging I elected to terminate the procedure at this point. Evaluation of the right femoral access site demonstrated at this site was suitable for closure device. A 6 French Angio-Seal device was deployed without complication. Cone beam CT was then performed to evaluate for intracranial hemorrhage and treatment planning. This demonstrated no evidence significant acute intracranial hemorrhage. The patient was removed from anesthesia and transferred to recovery in stable condition. FINDINGS: 1. Successful extraction of left inferior division M2 branch occlusion. 2. Small residual embolus within a central M3 branch identified on angiogram. 3. Post treatment TICI score 2c 4. No evidence of intracranial  hemorrhage on cone beam CT. IMPRESSION: 1. Suction thrombectomy of left M2 occlusion. PLAN: 1. To ICU for routine postoperative supportive care. 2. Target SBP 120-160. 3. Postoperative femoral access site instructions entered into PACS. Electronically Signed   By: Reagan Camera M.D.   On: 09/26/2023 10:08   CT ANGIO HEAD NECK W WO CM W PERF (CODE STROKE) Result Date: 09/26/2023 CLINICAL DATA:  Code stroke EXAM: CT ANGIOGRAPHY HEAD AND NECK CT  PERFUSION BRAIN TECHNIQUE: Multidetector CT imaging of the head and neck was performed using the standard protocol during bolus administration of intravenous contrast. Multiplanar CT image reconstructions and MIPs were obtained to evaluate the vascular anatomy. Carotid stenosis measurements (when applicable) are obtained utilizing NASCET criteria, using the distal internal carotid diameter as the denominator. Multiphase CT imaging of the brain was performed following IV bolus contrast injection. Subsequent parametric perfusion maps were calculated using RAPID software. RADIATION DOSE REDUCTION: This exam was performed according to the departmental dose-optimization program which includes automated exposure control, adjustment of the mA and/or kV according to patient size and/or use of iterative reconstruction technique. CONTRAST:  OMNIPAQUE  IOHEXOL  350 MG/ML SOLN COMPARISON:  Noncontrast CT from earlier today FINDINGS: CTA NECK FINDINGS Aortic arch: Atheromatous wall thickening. No acute finding or dilatation Right carotid system: Mild atheromatous wall thickening. No stenosis, ulceration, or beading. Left carotid system: Mild atheromatous wall thickening. No stenosis, ulceration, or beading. Vertebral arteries: No proximal subclavian stenosis. The left vertebral artery is dominant. Plaque at the left vertebral origin with mild narrowing. No vertebral beading or dissection. Skeleton: Advanced cervical spine degeneration. Severe dental caries. Other neck: No acute finding Upper chest: No acute finding Review of the MIP images confirms the above findings CTA HEAD FINDINGS Anterior circulation: Atheromatous calcification of the cavernous carotids. Abrupt cut off at the left M1 2 junction. On the right there is diminished flow beginning at proximal right M2 level correlating to chronic infarction. Negative for aneurysm Posterior circulation: Left dominant vertebral artery. The vertebral and basilar arteries are  smoothly contoured and widely patent. No branch occlusion, beading, or aneurysm Venous sinuses: Diffusely patent Anatomic variants: None significant Review of the MIP images confirms the above findings CT Brain Perfusion Findings: ASPECTS: 9 (updated history of right-sided weakness with cytotoxic edema seen at the left insula). CBF (<30%) Volume: 0 Perfusion (Tmax>6.0s) volume: 68 cc, minority of this volume localizing to old right MCA territory infarct. Case discussed with Dr. Doretta Gant at 7:51 a.m. IMPRESSION: Emergent large vessel occlusion at the left M1/2 junction with extensive penumbra. No core infarct by CT perfusion but there is left insular infarct and aspects of 9. Diminished flow in right MCA branches correlating with large remote infarction. Atherosclerosis without flow reducing stenosis or ulceration of major arteries in the neck. Electronically Signed   By: Ronnette Coke M.D.   On: 09/26/2023 08:00   CT HEAD CODE STROKE WO CONTRAST Result Date: 09/26/2023 CLINICAL DATA:  Code stroke. Aphasia and left upper extremity weakness EXAM: CT HEAD WITHOUT CONTRAST TECHNIQUE: Contiguous axial images were obtained from the base of the skull through the vertex without intravenous contrast. RADIATION DOSE REDUCTION: This exam was performed according to the departmental dose-optimization program which includes automated exposure control, adjustment of the mA and/or kV according to patient size and/or use of iterative reconstruction technique. COMPARISON:  Brain MRI 11/11/2022 FINDINGS: Brain: Chronic right MCA distribution infarct affecting the insula and posterior frontal cortex primarily. No evidence of adjacent acute infarction. No hemorrhage, shift, hydrocephalus, or  mass. Vascular: No hyperdense vessel or unexpected calcification. Skull: Normal. Negative for fracture or focal lesion. Sinuses/Orbits: Gaze to the left Other: Prelim sent in epic chat ASPECTS [facility] Stroke Program Early CT Score) Not scored in  the setting of extensive chronic infarction IMPRESSION: Large remote right MCA territory infarction. No progression or other acute finding when compared to 11/11/2022 brain MRI Electronically Signed   By: Ronnette Coke M.D.   On: 09/26/2023 07:38      HISTORY OF PRESENT ILLNESS 63 y.o. 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 A code stroke was activated on arrival and patient was taken to CT where imaging revealed an emergent LVO at the left M1/M2 junction with extensive penumbra and patient was taken to IR for thrombectomy with TICI2c revascularization.   HOSPITAL COURSE Stroke:  left insular cortex infarcts and small left temporal and temporal infarcts with left M1/M2 occlusion s/p IR with TICI2c, etiology secondary to persistent substance abuse Code Stroke CT head Large remote right MCA territory infarction. ASPECTS 9 CTA head & neck Abrupt cut off at the left M1 M2 junction on the right, there is diminished flow beginning at proximal right M2 level correlating to chronic infarction CT perfusion 0/68 cc S/p IR with TICI2c reperfusion Post IR CT: no hemorrhage MRI  Acute infarcts involving the left insular cortex as well as additional scattered acute cortical infarcts involving the left frontal operculum, posterior left temporal lobe, and left middle frontal gyrus. Redemonstrated remote right MCA territory infarct. 2D Echo:  EF 30-35% with mildly dilated LA Recommend 30-day cardiac event monitor as outpt--Patient declined.  LDL 105 HgbA1c 5.2 UDS positive for cocaine VTE prophylaxis - lovenox added No antithrombotic prior to admission, now on Aspirin  and Plavix  for 3 weeks and then aspirin  alone. Therapy recommendations:  No follow up needed  Disposition:  home     Hx of Stroke/TIA 05/2019: Admitted for right side weakness.  Received tPA.  MRI negative.  Echo showed EF of 40 to 45%. LDL 99, and A1C 5.4. UDS cocaine and THC positive.  Discharged on DAPT and Lipitor  40.  At this time patient endorsed noncompliance with his medications 05/2020: Admitted for right MCA infarct with right M1 occlusion status post IR with TICI2c. EF 25-30%, LDL 84, A1C 5.4 and UDS positive for cocaine and THC. Discharged on DAPT for 3 weeks and lipitor  80. Recommend 30 day cardiac monitoring as outpt  09/2021: Admitted for dysarthria, left facial droop, left-sided weakness, status post TNK administration.  Echo showed EF of 30 to 35%.  MRI shows acute right MCA branch infarct. CTA  head and  neck showed R M2 occlusion. LDL 105 and A1C 5.3. UDS positive for cocaine and THC. Discharged on DAPT for 3 months, then asa alone as well as lipitor  40. Patient endorses medication non-compliance  CHF CAD and MI 2018 s/p stenting Acute anterior STEMI 2018 Total occlusion of proximal LAD, heavy thrombus S/P successful PTCA/DES x 1 to proximal LAD Placed on aspirin , Brilinta , Coreg , lisinopril , statin 09/2021 EF 30-35% This admission 2D Echo EF 30-35%  Stable, continue outpt follow up with cardiology Now on DAPT   Hypertension Home meds:  none Stable, on lower end Long term BP goal normotensive   Hyperlipidemia Home meds:  Lipitor  40mg , noncompliant  LDL 105, goal <  70 Increase to Lipitor  80mg  Continue statin at discharge   Tobacco Abuse Patient is a current smoker      Ready to quit? No Nicotine  replacement therapy provided, continue at discharge   Substance Abuse Patient has a history of cocaine use UDS positive for cocaine this time and on all previous stroke intervention admissions      Ready to quit? No. Patient states that he knows he "should cut down some" TOC consult for cessation placed   Other stroke risk factors Alcohol use, recommend no more than one drink per  day   Other Active Problems Medication noncompliance Thorough education provided at bedside    DISCHARGE EXAM Blood pressure 120/72, pulse 60, temperature 98 F (36.7 C), temperature source Oral, resp. rate 14, height 5\' 9"  (1.753 m), weight 72.6 kg, SpO2 100%.  PHYSICAL EXAM General:  Alert, well-nourished, well-developed patient in no acute distress Psych:  Mood and affect appropriate for situation CV: Regular rate and rhythm on monitor Respiratory:  Regular, unlabored respirations on room air   NEURO:  Mental Status: AA&Ox3, patient is able to give clear and coherent history Speech/Language: speech is without dysarthria or aphasia.  Naming, repetition, fluency, and comprehension intact.   Cranial Nerves:  II: PERRL. Visual fields full.  III, IV, VI: EOMI. Eyelids elevate symmetrically.  V: Sensation is intact to light touch and symmetrical to face.  VII: Face is symmetrical resting and smiling VIII: hearing intact to voice. IX, X: Palate elevates symmetrically. Phonation is normal.  NG:[id number] shrug 5/5. XII: tongue is midline without fasciculations. Motor:  4/5 LLE at baseline, no drift.  Baseline right hip decreased ROM due to pain.   Tone: is normal and bulk is normal Sensation- Intact to light touch bilaterally. Extinction absent to light touch to DSS.   Coordination: FTN intact bilaterally, HKS: no ataxia in BLE.No drift.  Gait- deferred   Discharge NIH: 0  Discharge Diet       Diet   Diet Heart Fluid consistency: Thin   liquids  DISCHARGE PLAN Disposition:  home aspirin  81 mg daily and clopidogrel  75 mg daily for secondary stroke prevention for 3 weeks then aspirin  alone. Ongoing stroke risk factor control by Primary Care Physician at time of discharge Follow-up PCP Medicine, [facility] in 2 weeks. Follow-up in [facility] in 8 weeks, office to schedule an appointment.   35 minutes were spent preparing  discharge.   Pt seen by Neuro NP/APP and later by MD. Note/plan to be edited by MD as needed.    Audrene Lease, DNP, AGACNP-BC [facility] Please use AMION for contact information & EPIC for messaging.  ATTENDING NOTE: I reviewed above note and agree with the assessment and plan. Pt was seen and examined.   No family at bedside.  Patient lying in bed, initially sleeping, however easily arousable and maintaining wakefulness.  He has no acute event overnight, no complaints this morning.  Stroke risk factor modification education again provided.  Smoking cessation and cocaine cessation education provided.  Patient declined 30-day CardioNet monitoring at this time.  Continue DAPT for 3 weeks and then as alone.  Continue statin.  Follow-up as outpatient with [facility] neurology and outpatient cardiology.  PT and OT no recommendation.  For detailed assessment and plan, please refer to above as I have made changes wherever appropriate.   Consuelo Denmark, MD PhD Stroke Neurology 09/28/2023 1:45 PM

## 2023-09-28 NOTE — Plan of Care (Signed)
  Problem: Education: Goal: Knowledge of disease or condition will improve Outcome: Adequate for Discharge Goal: Knowledge of secondary prevention will improve (MUST DOCUMENT ALL) Outcome: Adequate for Discharge Goal: Knowledge of patient specific risk factors will improve (DELETE if not current risk factor) Outcome: Adequate for Discharge   Problem: Ischemic Stroke/TIA Tissue Perfusion: Goal: Complications of ischemic stroke/TIA will be minimized Outcome: Adequate for Discharge   Problem: Coping: Goal: Will verbalize positive feelings about self Outcome: Adequate for Discharge Goal: Will identify appropriate support needs Outcome: Adequate for Discharge   Problem: Health Behavior/Discharge Planning: Goal: Ability to manage health-related needs will improve Outcome: Adequate for Discharge Goal: Goals will be collaboratively established with patient/family Outcome: Adequate for Discharge   Problem: Self-Care: Goal: Ability to participate in self-care as condition permits will improve Outcome: Adequate for Discharge Goal: Verbalization of feelings and concerns over difficulty with self-care will improve Outcome: Adequate for Discharge Goal: Ability to communicate needs accurately will improve Outcome: Adequate for Discharge   Problem: Nutrition: Goal: Risk of aspiration will decrease Outcome: Adequate for Discharge Goal: Dietary intake will improve Outcome: Adequate for Discharge   Problem: Education: Goal: Knowledge of General Education information will improve Description: Including pain rating scale, medication(s)/side effects and non-pharmacologic comfort measures Outcome: Adequate for Discharge   Problem: Health Behavior/Discharge Planning: Goal: Ability to manage health-related needs will improve Outcome: Adequate for Discharge   Problem: Clinical Measurements: Goal: Ability to maintain clinical measurements within normal limits will improve Outcome: Adequate for  Discharge Goal: Will remain free from infection Outcome: Adequate for Discharge Goal: Diagnostic test results will improve Outcome: Adequate for Discharge Goal: Respiratory complications will improve Outcome: Adequate for Discharge Goal: Cardiovascular complication will be avoided Outcome: Adequate for Discharge   Problem: Activity: Goal: Risk for activity intolerance will decrease Outcome: Adequate for Discharge   Problem: Nutrition: Goal: Adequate nutrition will be maintained Outcome: Adequate for Discharge   Problem: Coping: Goal: Level of anxiety will decrease Outcome: Adequate for Discharge   Problem: Elimination: Goal: Will not experience complications related to bowel motility Outcome: Adequate for Discharge Goal: Will not experience complications related to urinary retention Outcome: Adequate for Discharge   Problem: Pain Managment: Goal: General experience of comfort will improve and/or be controlled Outcome: Adequate for Discharge   Problem: Safety: Goal: Ability to remain free from injury will improve Outcome: Adequate for Discharge   Problem: Skin Integrity: Goal: Risk for impaired skin integrity will decrease Outcome: Adequate for Discharge   Problem: Education: Goal: Understanding of CV disease, CV risk reduction, and recovery process will improve Outcome: Adequate for Discharge Goal: Individualized Educational Video(s) Outcome: Adequate for Discharge   Problem: Activity: Goal: Ability to return to baseline activity level will improve Outcome: Adequate for Discharge   Problem: Cardiovascular: Goal: Ability to achieve and maintain adequate cardiovascular perfusion will improve Outcome: Adequate for Discharge Goal: Vascular access site(s) Level 0-1 will be maintained Outcome: Adequate for Discharge   Problem: Health Behavior/Discharge Planning: Goal: Ability to safely manage health-related needs after discharge will improve Outcome: Adequate for  Discharge

## 2023-09-28 NOTE — Plan of Care (Signed)
 Problem: Education: Goal: Knowledge of disease or condition will improve 09/28/2023 1301 by Cyrus Drone, RN Outcome: Adequate for Discharge 09/28/2023 1301 by Cyrus Drone, RN Outcome: Adequate for Discharge Goal: Knowledge of secondary prevention will improve (MUST DOCUMENT ALL) 09/28/2023 1301 by Cyrus Drone, RN Outcome: Adequate for Discharge 09/28/2023 1301 by Cyrus Drone, RN Outcome: Adequate for Discharge Goal: Knowledge of patient specific risk factors will improve (DELETE if not current risk factor) 09/28/2023 1301 by Cyrus Drone, RN Outcome: Adequate for Discharge 09/28/2023 1301 by Cyrus Drone, RN Outcome: Adequate for Discharge   Problem: Ischemic Stroke/TIA Tissue Perfusion: Goal: Complications of ischemic stroke/TIA will be minimized 09/28/2023 1301 by Cyrus Drone, RN Outcome: Adequate for Discharge 09/28/2023 1301 by Cyrus Drone, RN Outcome: Adequate for Discharge   Problem: Coping: Goal: Will verbalize positive feelings about self 09/28/2023 1301 by Cyrus Drone, RN Outcome: Adequate for Discharge 09/28/2023 1301 by Cyrus Drone, RN Outcome: Adequate for Discharge Goal: Will identify appropriate support needs 09/28/2023 1301 by Cyrus Drone, RN Outcome: Adequate for Discharge 09/28/2023 1301 by Cyrus Drone, RN Outcome: Adequate for Discharge   Problem: Health Behavior/Discharge Planning: Goal: Ability to manage health-related needs will improve 09/28/2023 1301 by Cyrus Drone, RN Outcome: Adequate for Discharge 09/28/2023 1301 by Cyrus Drone, RN Outcome: Adequate for Discharge Goal: Goals will be collaboratively established with patient/family 09/28/2023 1301 by Cyrus Drone, RN Outcome: Adequate for Discharge 09/28/2023 1301 by Cyrus Drone, RN Outcome: Adequate for Discharge   Problem: Self-Care: Goal: Ability to participate in self-care as condition permits will improve 09/28/2023 1301 by Cyrus Drone, RN Outcome:  Adequate for Discharge 09/28/2023 1301 by Cyrus Drone, RN Outcome: Adequate for Discharge Goal: Verbalization of feelings and concerns over difficulty with self-care will improve 09/28/2023 1301 by Cyrus Drone, RN Outcome: Adequate for Discharge 09/28/2023 1301 by Cyrus Drone, RN Outcome: Adequate for Discharge Goal: Ability to communicate needs accurately will improve 09/28/2023 1301 by Cyrus Drone, RN Outcome: Adequate for Discharge 09/28/2023 1301 by Cyrus Drone, RN Outcome: Adequate for Discharge   Problem: Nutrition: Goal: Risk of aspiration will decrease 09/28/2023 1301 by Cyrus Drone, RN Outcome: Adequate for Discharge 09/28/2023 1301 by Cyrus Drone, RN Outcome: Adequate for Discharge Goal: Dietary intake will improve 09/28/2023 1301 by Cyrus Drone, RN Outcome: Adequate for Discharge 09/28/2023 1301 by Cyrus Drone, RN Outcome: Adequate for Discharge   Problem: Education: Goal: Knowledge of General Education information will improve Description: Including pain rating scale, medication(s)/side effects and non-pharmacologic comfort measures 09/28/2023 1301 by Cyrus Drone, RN Outcome: Adequate for Discharge 09/28/2023 1301 by Cyrus Drone, RN Outcome: Adequate for Discharge   Problem: Health Behavior/Discharge Planning: Goal: Ability to manage health-related needs will improve 09/28/2023 1301 by Cyrus Drone, RN Outcome: Adequate for Discharge 09/28/2023 1301 by Cyrus Drone, RN Outcome: Adequate for Discharge   Problem: Clinical Measurements: Goal: Ability to maintain clinical measurements within normal limits will improve 09/28/2023 1301 by Cyrus Drone, RN Outcome: Adequate for Discharge 09/28/2023 1301 by Cyrus Drone, RN Outcome: Adequate for Discharge Goal: Will remain free from infection 09/28/2023 1301 by Cyrus Drone, RN Outcome: Adequate for Discharge 09/28/2023 1301 by Cyrus Drone, RN Outcome: Adequate for Discharge Goal:  Diagnostic test results will improve 09/28/2023 1301 by Cyrus Drone, RN Outcome: Adequate for Discharge 09/28/2023 1301 by Cyrus Drone, RN Outcome: Adequate  for Discharge Goal: Respiratory complications will improve 09/28/2023 1301 by Cyrus Drone, RN Outcome: Adequate for Discharge 09/28/2023 1301 by Cyrus Drone, RN Outcome: Adequate for Discharge Goal: Cardiovascular complication will be avoided 09/28/2023 1301 by Cyrus Drone, RN Outcome: Adequate for Discharge 09/28/2023 1301 by Cyrus Drone, RN Outcome: Adequate for Discharge   Problem: Activity: Goal: Risk for activity intolerance will decrease 09/28/2023 1301 by Cyrus Drone, RN Outcome: Adequate for Discharge 09/28/2023 1301 by Cyrus Drone, RN Outcome: Adequate for Discharge   Problem: Nutrition: Goal: Adequate nutrition will be maintained 09/28/2023 1301 by Cyrus Drone, RN Outcome: Adequate for Discharge 09/28/2023 1301 by Cyrus Drone, RN Outcome: Adequate for Discharge   Problem: Coping: Goal: Level of anxiety will decrease 09/28/2023 1301 by Cyrus Drone, RN Outcome: Adequate for Discharge 09/28/2023 1301 by Cyrus Drone, RN Outcome: Adequate for Discharge   Problem: Elimination: Goal: Will not experience complications related to bowel motility 09/28/2023 1301 by Cyrus Drone, RN Outcome: Adequate for Discharge 09/28/2023 1301 by Cyrus Drone, RN Outcome: Adequate for Discharge Goal: Will not experience complications related to urinary retention 09/28/2023 1301 by Cyrus Drone, RN Outcome: Adequate for Discharge 09/28/2023 1301 by Cyrus Drone, RN Outcome: Adequate for Discharge   Problem: Pain Managment: Goal: General experience of comfort will improve and/or be controlled 09/28/2023 1301 by Cyrus Drone, RN Outcome: Adequate for Discharge 09/28/2023 1301 by Cyrus Drone, RN Outcome: Adequate for Discharge   Problem: Safety: Goal: Ability to remain free from injury will  improve 09/28/2023 1301 by Cyrus Drone, RN Outcome: Adequate for Discharge 09/28/2023 1301 by Cyrus Drone, RN Outcome: Adequate for Discharge   Problem: Skin Integrity: Goal: Risk for impaired skin integrity will decrease 09/28/2023 1301 by Cyrus Drone, RN Outcome: Adequate for Discharge 09/28/2023 1301 by Cyrus Drone, RN Outcome: Adequate for Discharge   Problem: Education: Goal: Understanding of CV disease, CV risk reduction, and recovery process will improve 09/28/2023 1301 by Cyrus Drone, RN Outcome: Adequate for Discharge 09/28/2023 1301 by Cyrus Drone, RN Outcome: Adequate for Discharge Goal: Individualized Educational Video(s) 09/28/2023 1301 by Cyrus Drone, RN Outcome: Adequate for Discharge 09/28/2023 1301 by Cyrus Drone, RN Outcome: Adequate for Discharge   Problem: Activity: Goal: Ability to return to baseline activity level will improve 09/28/2023 1301 by Cyrus Drone, RN Outcome: Adequate for Discharge 09/28/2023 1301 by Cyrus Drone, RN Outcome: Adequate for Discharge   Problem: Cardiovascular: Goal: Ability to achieve and maintain adequate cardiovascular perfusion will improve 09/28/2023 1301 by Cyrus Drone, RN Outcome: Adequate for Discharge 09/28/2023 1301 by Cyrus Drone, RN Outcome: Adequate for Discharge Goal: Vascular access site(s) Level 0-1 will be maintained 09/28/2023 1301 by Cyrus Drone, RN Outcome: Adequate for Discharge 09/28/2023 1301 by Cyrus Drone, RN Outcome: Adequate for Discharge   Problem: Health Behavior/Discharge Planning: Goal: Ability to safely manage health-related needs after discharge will improve 09/28/2023 1301 by Cyrus Drone, RN Outcome: Adequate for Discharge 09/28/2023 1301 by Cyrus Drone, RN Outcome: Adequate for Discharge

## 2023-09-29 ENCOUNTER — Encounter (HOSPITAL_COMMUNITY): Payer: Self-pay | Admitting: Radiology

## 2023-10-28 ENCOUNTER — Encounter: Payer: Self-pay | Admitting: Neurology

## 2023-10-28 ENCOUNTER — Other Ambulatory Visit: Payer: Self-pay | Admitting: Neurology

## 2023-10-28 ENCOUNTER — Ambulatory Visit (INDEPENDENT_AMBULATORY_CARE_PROVIDER_SITE_OTHER): Admitting: Neurology

## 2023-10-28 VITALS — BP 132/72 | HR 67 | Ht 68.0 in | Wt 158.5 lb

## 2023-10-28 DIAGNOSIS — I10 Essential (primary) hypertension: Secondary | ICD-10-CM

## 2023-10-28 DIAGNOSIS — I639 Cerebral infarction, unspecified: Secondary | ICD-10-CM

## 2023-10-28 DIAGNOSIS — F1410 Cocaine abuse, uncomplicated: Secondary | ICD-10-CM

## 2023-10-28 DIAGNOSIS — I63411 Cerebral infarction due to embolism of right middle cerebral artery: Secondary | ICD-10-CM | POA: Diagnosis not present

## 2023-10-28 DIAGNOSIS — E785 Hyperlipidemia, unspecified: Secondary | ICD-10-CM

## 2023-10-28 MED ORDER — ATORVASTATIN CALCIUM 40 MG PO TABS: 40.0000 mg | ORAL_TABLET | Freq: Every day | ORAL | 0 refills | Status: DC

## 2023-10-28 NOTE — Patient Instructions (Addendum)
 Continue aspirin  81 mg daily for secondary stroke prevention, stop Plavix  Follow up with cardiology  Will order cardiac monitor  Follow up with primary care, will need lipid recheck in 1-2 months Strict management of vascular risk factors with a goal BP less than 130/90, A1c less than 7.0, LDL less than 70 for secondary stroke prevention Follow up in 6 months

## 2023-10-28 NOTE — Progress Notes (Addendum)
 Patient: Mark Herrera Date of Birth: 19-Nov-1960  Reason for Visit: Follow up for CVA History from: Patient Primary Neurologist: Rosemarie  ASSESSMENT AND PLAN 63 y.o. year old male with right sided weakness with left insular cortex infarct as well as scattered acute cortical infarcts involving the left frontal off acute line, posterior left temporal lobe, left middle frontal gyrus with left M1/M2 occlusion s/p IR with TICI2c etiology secondary to persistent substance abuse.  UDS positive cocaine. History of CVA (2021-right MCA with M1 occlusion s/p IR with TICI2c, 2023 with TNK right MCA branch infarct, right M2 occlusion). Vascular risk factors: HTN. HLD, tobacco abuse, substance abuse.   - Continue aspirin  81 mg daily for secondary stroke prevention, can discontinue Plavix  at this point - I will refill his Lipitor  40 mg, he needs to follow-up with primary care, will be due for lipid recheck in 1 to 2 months, mention of rx 80 mg, but 40 mg was given at d/c - Cardiac monitor, EF 30 to 35% with mildly dilated LA, referral to cardiology (history of MI 2018 s/p stenting) - Commended on discontinuing cocaine use, advised to avoid substance abuse.  He is working on cutting back on cigarette smoking -Needs to follow-up with primary care strict management of vascular risk factors with a goal BP less than 130/90, A1c less than 7.0, LDL less than 70 for secondary stroke prevention - Recommend exercise, healthy eating for stroke prevention - Follow-up in 6 months or sooner if needed  Orders Placed This Encounter  Procedures   Ambulatory referral to Cardiology   Cardiac event monitor    Addendum 11/25/23 SS: Cardiac monitor did not show any AFIB. I have already referred him to cardiology.   Monitor shows sinus rhythm occasional PAC's and PVC's. No atrial fibrillation. No rhythm abnormality that would explain stroke.   HISTORY OF PRESENT ILLNESS: Today 10/28/23 Here today for follow up. He didn't  do cardiac monitor. He continues to take aspirin  and Plavix  both. No bleeding or problems. Continues with chronic right leg weakness from prior CVA. He has stopped with cocaine use, he continues to smoke cigarettes 5 a day, uses patches, is helping. Has ran out of Lipitor . Has not seen PCP. He works in pressure washing, but trying to get full disability. He admits to not taking his medications like he should before stroke. BP 132/72 today.   HISTORY  Presented to the ER 09/26/2023 after waking with right sided weakness, speech disturbance, hearing impairment, lethargy.  CT showed emergent LVO at left M1/M2 junction with extensive penumbra and he was taken to IR for thrombectomy with TICI2c revascularization.  MRI showed acute infarcts involving the left insular cortex as well as additional scattered acute cortical infarcts involving left frontal operculum, posterior left temporal lobe and left middle frontal gyrus.  Etiology secondary to persistent substance abuse.  He has history of prior stroke with residual right lower extremity weakness. 2021 right MCA infarct with right M1 occlusion s/p IR with TICI2c, EF 35-30%. 2023 acute right MCA branch infarct given TNK, right M2 occlusion, UDS positive cocaine and THC.  History of medication noncompliance.  -Code stroke CT head large remote right MCA territory infarct. - CTA head and neck abrupt cut off at the left M1, M2 junction on the right.  There is diminished flow beginning at proximal right M2 level correlating to chronic infarct. - CT perfusion 0/68 cc -S/P IR with TICI2c reperfusion - Post IR CT head no hemorrhage - MRI brain  acute infarcts involving left insular cortex as well as additional scattered acute cortical infarcts involving the left frontal off acute lung, posterior left temporal lobe, left middle frontal gyrus.  Redemonstrated remote right MCA territory infarct. - 2D echo EF 30 to 35% with mildly dilated left atrium - Recommended 30-day  cardiac monitor, patient declined - LDL 105, Lipitor  increased to 80 mg daily - A1c 5.2 - UDS positive for cocaine - No antithrombotic prior to admission, 3 weeks aspirin  81 and Plavix  75, then aspirin  alone  REVIEW OF SYSTEMS: Out of a complete 14 system review of symptoms, the patient complains only of the following symptoms, and all other reviewed systems are negative.  See HPI  ALLERGIES: No Known Allergies  HOME MEDICATIONS: Outpatient Medications Prior to Visit  Medication Sig Dispense Refill   aspirin  EC 81 MG tablet Take 1 tablet (81 mg total) by mouth daily. Swallow whole. 30 tablet 12   atorvastatin  (LIPITOR ) 40 MG tablet Take 1 tablet (40 mg total) by mouth daily. 60 tablet 0   clopidogrel  (PLAVIX ) 75 MG tablet Take 1 tablet (75 mg total) by mouth daily. 21 tablet 0   nicotine  (NICODERM CQ  - DOSED IN MG/24 HOURS) 14 mg/24hr patch Place 1 patch (14 mg total) onto the skin daily. 28 patch 0   No facility-administered medications prior to visit.    PAST MEDICAL HISTORY: Past Medical History:  Diagnosis Date   CAD (coronary artery disease)    10/08/16 STEMI DES x1 to pLAD EF 45%   Cocaine abuse (HCC)    CVA (cerebral vascular accident) (HCC)    Gout    Tobacco abuse     PAST SURGICAL HISTORY: Past Surgical History:  Procedure Laterality Date   CORONARY STENT INTERVENTION N/A 10/08/2016   Procedure: Coronary Stent Intervention;  Surgeon: Verlin Lonni BIRCH, MD;  Location: [facility] INVASIVE CV LAB;  Service: Cardiovascular;  Laterality: N/A;   IR CT HEAD LTD  05/31/2020   IR CT HEAD LTD  09/26/2023   IR PERCUTANEOUS ART THROMBECTOMY/INFUSION INTRACRANIAL INC DIAG ANGIO  05/31/2020   IR PERCUTANEOUS ART THROMBECTOMY/INFUSION INTRACRANIAL INC DIAG ANGIO  09/26/2023   IR US  GUIDE VASC ACCESS RIGHT  05/31/2020   IR US  GUIDE VASC ACCESS RIGHT  09/26/2023   LEFT HEART CATH AND CORONARY ANGIOGRAPHY N/A 10/08/2016   Procedure: Left Heart Cath and Coronary Angiography;  Surgeon:  Verlin Lonni BIRCH, MD;  Location: [facility] INVASIVE CV LAB;  Service: Cardiovascular;  Laterality: N/A;   RADIOLOGY WITH ANESTHESIA N/A 05/31/2020   Procedure: IR WITH ANESTHESIA;  Surgeon: Radiologist, Medication, MD;  Location: [facility] OR;  Service: Radiology;  Laterality: N/A;   RADIOLOGY WITH ANESTHESIA N/A 09/05/2021   Procedure: RADIOLOGY WITH ANESTHESIA;  Surgeon: Radiologist, Medication, MD;  Location: [facility] OR;  Service: Radiology;  Laterality: N/A;   RADIOLOGY WITH ANESTHESIA N/A 09/26/2023   Procedure: RADIOLOGY WITH ANESTHESIA;  Surgeon: Radiologist, Medication, MD;  Location: [facility] OR;  Service: Radiology;  Laterality: N/A;   TOE SURGERY      FAMILY HISTORY: Family History  Problem Relation Age of Onset   Hypertension Father     SOCIAL HISTORY: Social History   Socioeconomic History   Marital status: Single    Spouse name: Not on file   Number of children: Not on file   Years of education: Not on file   Highest education level: Not on file  Occupational History   Not on file  Tobacco Use   Smoking status: Every Day  Current packs/day: 1.00    Types: Cigarettes   Smokeless tobacco: Never  Substance and Sexual Activity   Alcohol use: Yes    Comment: occ   Drug use: Yes    Types: Marijuana   Sexual activity: Not on file  Other Topics Concern   Not on file  Social History Narrative   Not on file   Social Drivers of Health   Financial Resource Strain: Not at Risk (01/08/2023)   Received from [employer]    Financial Resource Strain: 1  Food Insecurity: Not at Risk (01/08/2023)   Received from [facility] Insecurity    Food: 1  Transportation Needs: Not at Risk (01/08/2023)   Received from [employer] Needs    Transportation: 1  Physical Activity: Not on File (12/13/2022)   Received from [facility]   Physical Activity    Physical Activity: 0  Stress: Not on File (12/13/2022)   Received from [facility]   Stress    Stress: 0  Social  Connections: Not on File (02/05/2023)   Received from [employer] [facility]   Social Connections    Connectedness: 0  Intimate Partner Violence: Not on file    PHYSICAL EXAM  Vitals:   10/28/23 1025  Pulse: 67  Weight: 158 lb 8 oz (71.9 kg)  Height: 5' 8 (1.727 m)   Body mass index is 24.1 kg/m.  Generalized: Well developed, in no acute distress  Neurological examination  Mentation: Alert oriented to time, place, history taking. Follows all commands speech and language fluent Cranial nerve II-XII: Pupils were equal round reactive to light. Extraocular movements were full, visual field were full on confrontational test. Facial sensation and strength were normal.  Head turning and shoulder shrug  were normal and symmetric. Motor: The motor testing reveals 5 over 5 strength of all 4 extremities. Good symmetric motor tone is noted throughout.  Sensory: Sensory testing is intact to soft touch on all 4 extremities. No evidence of extinction is noted.  Coordination: Cerebellar testing reveals good finger-nose-finger and heel-to-shin bilaterally.  Gait and station: Limp on the right, but is independent. Tandem gait is unsteady.  Reflexes: Deep tendon reflexes are symmetric but a little increased on the right side.  DIAGNOSTIC DATA (LABS, IMAGING, TESTING) - I reviewed patient records, labs, notes, testing and imaging myself where available.  Lab Results  Component Value Date   WBC 4.1 09/28/2023   HGB 13.2 09/28/2023   HCT 41.6 09/28/2023   MCV 88.5 09/28/2023   PLT 233 09/28/2023      Component Value Date/Time   NA 140 09/28/2023 0727   K 4.5 09/28/2023 0727   CL 105 09/28/2023 0727   CO2 27 09/28/2023 0727   GLUCOSE 97 09/28/2023 0727   BUN 13 09/28/2023 0727   CREATININE 0.95 09/28/2023 0727   CALCIUM  8.8 (L) 09/28/2023 0727   PROT 5.4 (L) 09/27/2023 0359   ALBUMIN 2.6 (L) 09/27/2023 0359   AST 21 09/27/2023 0359   ALT 16 09/27/2023 0359   ALKPHOS 58 09/27/2023 0359   BILITOT 0.5  09/27/2023 0359   GFRNONAA >60 09/28/2023 0727   GFRAA >60 06/01/2019 0553   Lab Results  Component Value Date   CHOL 164 09/27/2023   HDL 52 09/27/2023   LDLCALC 105 (H) 09/27/2023   TRIG 33 09/27/2023   CHOLHDL 3.2 09/27/2023   Lab Results  Component Value Date   HGBA1C 5.2 09/27/2023   No results found for: [id number]  No results found for: TSH  Lauraine Born, AGNP-C, DNP 10/28/2023, 10:42 AM [facility] [address], [address] [place], [place] [address] [phone]

## 2023-10-29 NOTE — Progress Notes (Signed)
 I agree with the above plan

## 2023-11-25 ENCOUNTER — Ambulatory Visit: Payer: Self-pay | Admitting: Neurology

## 2023-11-25 ENCOUNTER — Ambulatory Visit: Payer: MEDICAID | Attending: Neurology

## 2023-11-25 DIAGNOSIS — I63411 Cerebral infarction due to embolism of right middle cerebral artery: Secondary | ICD-10-CM

## 2023-11-25 DIAGNOSIS — I639 Cerebral infarction, unspecified: Secondary | ICD-10-CM | POA: Diagnosis not present

## 2023-12-23 ENCOUNTER — Emergency Department (HOSPITAL_COMMUNITY)

## 2023-12-23 ENCOUNTER — Encounter (HOSPITAL_COMMUNITY)
Admission: EM | Disposition: A | Discharge status: Home / Self Care | Payer: Self-pay | Source: Home / Self Care | Attending: Neurology

## 2023-12-23 ENCOUNTER — Inpatient Hospital Stay (HOSPITAL_COMMUNITY)
Admission: EM | Admit: 2023-12-23 | Discharge: 2023-12-25 | DRG: 024 | Disposition: A | Discharge status: Home / Self Care | Attending: Student in an Organized Health Care Education/Training Program | Admitting: Student in an Organized Health Care Education/Training Program

## 2023-12-23 ENCOUNTER — Inpatient Hospital Stay (HOSPITAL_COMMUNITY): Admitting: Critical Care Medicine

## 2023-12-23 ENCOUNTER — Inpatient Hospital Stay (HOSPITAL_COMMUNITY)

## 2023-12-23 DIAGNOSIS — I6601 Occlusion and stenosis of right middle cerebral artery: Secondary | ICD-10-CM

## 2023-12-23 DIAGNOSIS — E785 Hyperlipidemia, unspecified: Secondary | ICD-10-CM | POA: Diagnosis present

## 2023-12-23 DIAGNOSIS — Z955 Presence of coronary angioplasty implant and graft: Secondary | ICD-10-CM | POA: Diagnosis not present

## 2023-12-23 DIAGNOSIS — Z7902 Long term (current) use of antithrombotics/antiplatelets: Secondary | ICD-10-CM | POA: Diagnosis not present

## 2023-12-23 DIAGNOSIS — I639 Cerebral infarction, unspecified: Secondary | ICD-10-CM

## 2023-12-23 DIAGNOSIS — Z7982 Long term (current) use of aspirin: Secondary | ICD-10-CM | POA: Diagnosis not present

## 2023-12-23 DIAGNOSIS — I509 Heart failure, unspecified: Secondary | ICD-10-CM

## 2023-12-23 DIAGNOSIS — I63521 Cerebral infarction due to unspecified occlusion or stenosis of right anterior cerebral artery: Principal | ICD-10-CM | POA: Diagnosis present

## 2023-12-23 DIAGNOSIS — I6350 Cerebral infarction due to unspecified occlusion or stenosis of unspecified cerebral artery: Secondary | ICD-10-CM

## 2023-12-23 DIAGNOSIS — R29700 NIHSS score 0: Secondary | ICD-10-CM | POA: Diagnosis not present

## 2023-12-23 DIAGNOSIS — Z91148 Patient's other noncompliance with medication regimen for other reason: Secondary | ICD-10-CM | POA: Diagnosis not present

## 2023-12-23 DIAGNOSIS — I119 Hypertensive heart disease without heart failure: Secondary | ICD-10-CM | POA: Diagnosis present

## 2023-12-23 DIAGNOSIS — R29810 Facial weakness: Secondary | ICD-10-CM | POA: Diagnosis present

## 2023-12-23 DIAGNOSIS — W19XXXA Unspecified fall, initial encounter: Secondary | ICD-10-CM | POA: Diagnosis present

## 2023-12-23 DIAGNOSIS — I110 Hypertensive heart disease with heart failure: Secondary | ICD-10-CM

## 2023-12-23 DIAGNOSIS — F17210 Nicotine dependence, cigarettes, uncomplicated: Secondary | ICD-10-CM | POA: Diagnosis present

## 2023-12-23 DIAGNOSIS — I2510 Atherosclerotic heart disease of native coronary artery without angina pectoris: Secondary | ICD-10-CM | POA: Diagnosis present

## 2023-12-23 DIAGNOSIS — M109 Gout, unspecified: Secondary | ICD-10-CM | POA: Diagnosis present

## 2023-12-23 DIAGNOSIS — G8194 Hemiplegia, unspecified affecting left nondominant side: Secondary | ICD-10-CM | POA: Diagnosis present

## 2023-12-23 DIAGNOSIS — Z79899 Other long term (current) drug therapy: Secondary | ICD-10-CM

## 2023-12-23 DIAGNOSIS — I252 Old myocardial infarction: Secondary | ICD-10-CM

## 2023-12-23 DIAGNOSIS — F1411 Cocaine abuse, in remission: Secondary | ICD-10-CM | POA: Diagnosis not present

## 2023-12-23 DIAGNOSIS — R29711 NIHSS score 11: Secondary | ICD-10-CM | POA: Diagnosis present

## 2023-12-23 DIAGNOSIS — F1210 Cannabis abuse, uncomplicated: Secondary | ICD-10-CM | POA: Diagnosis present

## 2023-12-23 DIAGNOSIS — I739 Peripheral vascular disease, unspecified: Secondary | ICD-10-CM | POA: Diagnosis not present

## 2023-12-23 DIAGNOSIS — F1410 Cocaine abuse, uncomplicated: Secondary | ICD-10-CM | POA: Diagnosis present

## 2023-12-23 DIAGNOSIS — G8104 Flaccid hemiplegia affecting left nondominant side: Secondary | ICD-10-CM | POA: Diagnosis present

## 2023-12-23 DIAGNOSIS — G9389 Other specified disorders of brain: Secondary | ICD-10-CM | POA: Diagnosis present

## 2023-12-23 DIAGNOSIS — F1911 Other psychoactive substance abuse, in remission: Secondary | ICD-10-CM

## 2023-12-23 DIAGNOSIS — F1010 Alcohol abuse, uncomplicated: Secondary | ICD-10-CM | POA: Diagnosis present

## 2023-12-23 DIAGNOSIS — I7090 Unspecified atherosclerosis: Secondary | ICD-10-CM | POA: Diagnosis not present

## 2023-12-23 DIAGNOSIS — I6389 Other cerebral infarction: Secondary | ICD-10-CM | POA: Diagnosis not present

## 2023-12-23 DIAGNOSIS — Z8249 Family history of ischemic heart disease and other diseases of the circulatory system: Secondary | ICD-10-CM

## 2023-12-23 DIAGNOSIS — I69351 Hemiplegia and hemiparesis following cerebral infarction affecting right dominant side: Secondary | ICD-10-CM | POA: Diagnosis not present

## 2023-12-23 DIAGNOSIS — I63511 Cerebral infarction due to unspecified occlusion or stenosis of right middle cerebral artery: Principal | ICD-10-CM | POA: Diagnosis present

## 2023-12-23 HISTORY — PX: IR CT HEAD LTD: IMG2386

## 2023-12-23 HISTORY — PX: RADIOLOGY WITH ANESTHESIA: SHX6223

## 2023-12-23 HISTORY — PX: IR PERCUTANEOUS ART THROMBECTOMY/INFUSION INTRACRANIAL INC DIAG ANGIO: IMG6087

## 2023-12-23 LAB — RESP PANEL BY RT-PCR (RSV, FLU A&B, COVID)  RVPGX2
Influenza A by PCR: NEGATIVE
Influenza B by PCR: NEGATIVE
Resp Syncytial Virus by PCR: NEGATIVE
SARS Coronavirus 2 by RT PCR: NEGATIVE

## 2023-12-23 LAB — COMPREHENSIVE METABOLIC PANEL WITH GFR
ALT: 14 U/L (ref 0–44)
AST: 18 U/L (ref 15–41)
Albumin: 3.3 g/dL — ABNORMAL LOW (ref 3.5–5.0)
Alkaline Phosphatase: 70 U/L (ref 38–126)
Anion gap: 11 (ref 5–15)
BUN: 14 mg/dL (ref 8–23)
CO2: 23 mmol/L (ref 22–32)
Calcium: 8.7 mg/dL — ABNORMAL LOW (ref 8.9–10.3)
Chloride: 104 mmol/L (ref 98–111)
Creatinine, Ser: 0.98 mg/dL (ref 0.61–1.24)
GFR, Estimated: >60 mL/min (ref >60)
Glucose, Bld: 113 mg/dL — ABNORMAL HIGH (ref 70–99)
Potassium: 3.7 mmol/L (ref 3.5–5.1)
Sodium: 138 mmol/L (ref 135–145)
Total Bilirubin: 0.8 mg/dL (ref 0.0–1.2)
Total Protein: 6.4 g/dL — ABNORMAL LOW (ref 6.5–8.1)

## 2023-12-23 LAB — DIFFERENTIAL
Abs Immature Granulocytes: 0 K/uL (ref 0.00–0.07)
Basophils Absolute: 0 K/uL (ref 0.0–0.1)
Basophils Relative: 1 %
Eosinophils Absolute: 0.3 K/uL (ref 0.0–0.5)
Eosinophils Relative: 7 %
Immature Granulocytes: 0 %
Lymphocytes Relative: 29 %
Lymphs Abs: 1.2 K/uL (ref 0.7–4.0)
Monocytes Absolute: 0.4 K/uL (ref 0.1–1.0)
Monocytes Relative: 11 %
Neutro Abs: 2.2 K/uL (ref 1.7–7.7)
Neutrophils Relative %: 52 %

## 2023-12-23 LAB — CBC
HCT: 40.8 % (ref 39.0–52.0)
Hemoglobin: 13.3 g/dL (ref 13.0–17.0)
MCH: 28.2 pg (ref 26.0–34.0)
MCHC: 32.6 g/dL (ref 30.0–36.0)
MCV: 86.4 fL (ref 80.0–100.0)
Platelets: 188 K/uL (ref 150–400)
RBC: 4.72 MIL/uL (ref 4.22–5.81)
RDW: 14.6 % (ref 11.5–15.5)
WBC: 4.1 K/uL (ref 4.0–10.5)
nRBC: 0 % (ref 0.0–0.2)

## 2023-12-23 LAB — I-STAT CHEM 8, ED
BUN: 16 mg/dL (ref 8–23)
Calcium, Ion: 1.07 mmol/L — ABNORMAL LOW (ref 1.15–1.40)
Chloride: 105 mmol/L (ref 98–111)
Creatinine, Ser: 1 mg/dL (ref 0.61–1.24)
Glucose, Bld: 108 mg/dL — ABNORMAL HIGH (ref 70–99)
HCT: 43 % (ref 39.0–52.0)
Hemoglobin: 14.6 g/dL (ref 13.0–17.0)
Potassium: 3.7 mmol/L (ref 3.5–5.1)
Sodium: 139 mmol/L (ref 135–145)
TCO2: 24 mmol/L (ref 22–32)

## 2023-12-23 LAB — APTT: aPTT: 29 s (ref 24–36)

## 2023-12-23 LAB — ETHANOL: Alcohol, Ethyl (B): <15 mg/dL (ref <15)

## 2023-12-23 LAB — CBG MONITORING, ED: Glucose-Capillary: 114 mg/dL — ABNORMAL HIGH (ref 70–99)

## 2023-12-23 LAB — PROTIME-INR
INR: 1 (ref 0.8–1.2)
Prothrombin Time: 14.1 s (ref 11.4–15.2)

## 2023-12-23 SURGERY — RADIOLOGY WITH ANESTHESIA
Anesthesia: General

## 2023-12-23 MED ORDER — ACETAMINOPHEN 160 MG/5ML PO SOLN: 650.0000 mg | ORAL | Status: DC | PRN

## 2023-12-23 MED ORDER — SUCCINYLCHOLINE CHLORIDE 200 MG/10ML IV SOSY
PREFILLED_SYRINGE | INTRAVENOUS | Status: DC | PRN
  Administered 2023-12-23: 70 mg via INTRAVENOUS

## 2023-12-23 MED ORDER — ORAL CARE MOUTH RINSE: 15.0000 mL | OROMUCOSAL | Status: DC | PRN

## 2023-12-23 MED ORDER — OXYCODONE HCL 5 MG PO TABS: 5.0000 mg | ORAL_TABLET | Freq: Once | ORAL | Status: DC | PRN

## 2023-12-23 MED ORDER — SODIUM CHLORIDE 0.9 % IV SOLN: INTRAVENOUS | Status: DC

## 2023-12-23 MED ORDER — LACTATED RINGERS IV SOLN
INTRAVENOUS | Status: DC | PRN
Start: 2023-12-23 — End: 2023-12-23

## 2023-12-23 MED ORDER — NITROGLYCERIN 1 MG/10 ML FOR IR/CATH LAB
INTRA_ARTERIAL | Status: AC
Start: 2023-12-23 — End: 2023-12-23
  Filled 2023-12-23: qty 10

## 2023-12-23 MED ORDER — OXYCODONE HCL 5 MG/5ML PO SOLN: 5.0000 mg | Freq: Once | ORAL | Status: DC | PRN

## 2023-12-23 MED ORDER — PHENYLEPHRINE HCL-NACL 20-0.9 MG/250ML-% IV SOLN
INTRAVENOUS | Status: DC | PRN
  Administered 2023-12-23: 25 ug/min via INTRAVENOUS

## 2023-12-23 MED ORDER — SUGAMMADEX SODIUM 200 MG/2ML IV SOLN
INTRAVENOUS | Status: DC | PRN
Start: 2023-12-23 — End: 2023-12-23
  Administered 2023-12-23: 200 mg via INTRAVENOUS

## 2023-12-23 MED ORDER — FENTANYL CITRATE (PF) 100 MCG/2ML IJ SOLN
INTRAMUSCULAR | Status: AC
  Filled 2023-12-23: qty 2

## 2023-12-23 MED ORDER — PHENYLEPHRINE 80 MCG/ML (10ML) SYRINGE FOR IV PUSH (FOR BLOOD PRESSURE SUPPORT)
PREFILLED_SYRINGE | INTRAVENOUS | Status: DC | PRN
  Administered 2023-12-23: 40 ug via INTRAVENOUS

## 2023-12-23 MED ORDER — ONDANSETRON HCL 4 MG/2ML IJ SOLN
INTRAMUSCULAR | Status: DC | PRN
  Administered 2023-12-23: 4 mg via INTRAVENOUS

## 2023-12-23 MED ORDER — STROKE: EARLY STAGES OF RECOVERY BOOK
Freq: Once | Status: AC
Start: 2023-12-24 — End: 2023-12-24
  Administered 2023-12-24: 1
  Filled 2023-12-23: qty 1

## 2023-12-23 MED ORDER — PROPOFOL 10 MG/ML IV BOLUS
INTRAVENOUS | Status: DC | PRN
  Administered 2023-12-23: 30 mg via INTRAVENOUS
  Administered 2023-12-23: 50 mg via INTRAVENOUS
  Administered 2023-12-23: 100 mg via INTRAVENOUS

## 2023-12-23 MED ORDER — CHLORHEXIDINE GLUCONATE CLOTH 2 % EX PADS
6.0000 | MEDICATED_PAD | Freq: Every day | CUTANEOUS | Status: DC
  Administered 2023-12-24 – 2023-12-25 (×2): 6 via TOPICAL

## 2023-12-23 MED ORDER — NITROGLYCERIN 1 MG/10 ML FOR IR/CATH LAB
INTRA_ARTERIAL | Status: AC | PRN
  Administered 2023-12-23 (×3): 25 ug

## 2023-12-23 MED ORDER — ACETAMINOPHEN 325 MG PO TABS: 650.0000 mg | ORAL_TABLET | ORAL | Status: DC | PRN

## 2023-12-23 MED ORDER — ACETAMINOPHEN 10 MG/ML IV SOLN
INTRAVENOUS | Status: AC
  Filled 2023-12-23: qty 100

## 2023-12-23 MED ORDER — ACETAMINOPHEN 10 MG/ML IV SOLN
1000.0000 mg | Freq: Once | INTRAVENOUS | Status: DC | PRN
  Administered 2023-12-23: 1000 mg via INTRAVENOUS

## 2023-12-23 MED ORDER — CLEVIDIPINE BUTYRATE 0.5 MG/ML IV EMUL
0.0000 mg/h | INTRAVENOUS | Status: DC
  Filled 2023-12-23: qty 50

## 2023-12-23 MED ORDER — FENTANYL CITRATE (PF) 100 MCG/2ML IJ SOLN
INTRAMUSCULAR | Status: DC | PRN
  Administered 2023-12-23: 100 ug via INTRAVENOUS

## 2023-12-23 MED ORDER — DEXAMETHASONE SODIUM PHOSPHATE 10 MG/ML IJ SOLN
INTRAMUSCULAR | Status: DC | PRN
Start: 2023-12-23 — End: 2023-12-23
  Administered 2023-12-23: 4 mg via INTRAVENOUS

## 2023-12-23 MED ORDER — ROCURONIUM BROMIDE 10 MG/ML (PF) SYRINGE
PREFILLED_SYRINGE | INTRAVENOUS | Status: DC | PRN
  Administered 2023-12-23: 40 mg via INTRAVENOUS

## 2023-12-23 MED ORDER — AMISULPRIDE (ANTIEMETIC) 5 MG/2ML IV SOLN: 10.0000 mg | Freq: Once | INTRAVENOUS | Status: DC | PRN

## 2023-12-23 MED ORDER — LIDOCAINE HCL (CARDIAC) PF 100 MG/5ML IV SOSY
PREFILLED_SYRINGE | INTRAVENOUS | Status: DC | PRN
  Administered 2023-12-23: 60 mg via INTRATRACHEAL

## 2023-12-23 MED ORDER — SODIUM CHLORIDE 0.9% FLUSH: 3.0000 mL | Freq: Once | INTRAVENOUS | Status: DC

## 2023-12-23 MED ORDER — ACETAMINOPHEN 650 MG RE SUPP: 650.0000 mg | RECTAL | Status: DC | PRN

## 2023-12-23 MED ORDER — SENNOSIDES-DOCUSATE SODIUM 8.6-50 MG PO TABS: 1.0000 | ORAL_TABLET | Freq: Every evening | ORAL | Status: DC | PRN

## 2023-12-23 MED ORDER — FENTANYL CITRATE (PF) 100 MCG/2ML IJ SOLN
25.0000 ug | INTRAMUSCULAR | Status: DC | PRN
  Administered 2023-12-23 (×2): 25 ug via INTRAVENOUS

## 2023-12-23 MED ORDER — IOHEXOL 300 MG/ML  SOLN
150.0000 mL | Freq: Once | INTRAMUSCULAR | Status: AC | PRN
  Administered 2023-12-23: 100 mL via INTRA_ARTERIAL

## 2023-12-23 MED ORDER — LABETALOL HCL 5 MG/ML IV SOLN
INTRAVENOUS | Status: DC | PRN
  Administered 2023-12-23 (×3): 2.5 mg via INTRAVENOUS

## 2023-12-23 MED ORDER — IOHEXOL 350 MG/ML SOLN
100.0000 mL | Freq: Once | INTRAVENOUS | Status: AC | PRN
  Administered 2023-12-23: 100 mL via INTRAVENOUS

## 2023-12-23 NOTE — H&P (Signed)
 NEUROLOGY H&P   Date of service: December 23, 2023 Patient Name: Mark Herrera MRN:  [id number] DOB:  Dec 21, 1960 Chief Complaint: Left-sided weakness, facial droop Requesting Provider: Voncile Isles, MD  History of Present Illness  Mark Herrera is a 63 y.o. male with hx of multiple prior strokes with residual right hemiparesis and left-sided deficits from a prior stroke as well-secondary to persistent abuse and small vessel disease, tobacco and cocaine abuse, EtOH abuse, coronary artery disease, hyperlipidemia, presented for evaluation of sudden onset of left-sided weakness that was noticed after a fall.  Initially noted last known well was 3 PM but then there was another report which at the last known well probably was 6 PM.  Very unclear on the history at this time. Last stroke was less than 3 months ago.  That involve the left MCA territory requiring mechanical thrombectomy with TICI2C revascularization of left M1/M2, discharged on aspirin  and Plavix -unknown compliance. Patient came in in a c-collar.  Reports that the weakness started suddenly either after the fall where he fell because of the weakness-could not provide very clear history in the time. Patient reported compliance to medications.  Says he has quit cocaine Son reports that patient is not taking blood thinners because he was told not to take blood thinners by his outpatient doctors  LKW: 3 PM or 6 PM-unclear Modified rankin score: Was 1 prior to the last stroke 3 months ago, May 2025 follow-up after the last stroke in April-lists exam with unsteady gait due to persistent and chronic right leg weakness but otherwise good strength in all 4 extremities. IV Thrombolysis: No-stroke within the last 3 months EVT: Yes  NIHSS components Score: Comment  1a Level of Conscious 0[x]  1[]  2[]  3[]      1b LOC Questions 0[x]  1[]  2[]       1c LOC Commands 0[x]  1[]  2[]       2 Best Gaze 0[x]  1[]  2[]       3 Visual 0[x]  1[]  2[]  3[]      4  Facial Palsy 0[]  1[x]  2[]  3[]      5a Motor Arm - left 0[]  1[]  2[]  3[x]  4[]  UN[]    5b Motor Arm - Right 0[]  1[x]  2[]  3[]  4[]  UN[]    6a Motor Leg - Left 0[]  1[]  2[]  3[x]  4[]  UN[]    6b Motor Leg - Right 0[x]  1[]  2[]  3[]  4[]  UN[]    7 Limb Ataxia 0[x]  1[]  2[]  UN[]      8 Sensory 0[]  1[]  2[x]  UN[]      9 Best Language 0[x]  1[]  2[]  3[]      10 Dysarthria 0[]  1[x]  2[]  UN[]      11 Extinct. and Inattention 0[x]  1[]  2[]       TOTAL: 11      ROS  Comprehensive ROS performed and pertinent positives documented in HPI   Past History   Past Medical History:  Diagnosis Date   CAD (coronary artery disease)    10/08/16 STEMI DES x1 to pLAD EF 45%   Cocaine abuse (HCC)    CVA (cerebral vascular accident) (HCC)    Gout    Tobacco abuse     Past Surgical History:  Procedure Laterality Date   CORONARY STENT INTERVENTION N/A 10/08/2016   Procedure: Coronary Stent Intervention;  Surgeon: Verlin Lonni BIRCH, MD;  Location: [facility] INVASIVE CV LAB;  Service: Cardiovascular;  Laterality: N/A;   IR CT HEAD LTD  05/31/2020   IR CT HEAD LTD  09/26/2023   IR PERCUTANEOUS ART THROMBECTOMY/INFUSION INTRACRANIAL INC DIAG  ANGIO  05/31/2020   IR PERCUTANEOUS ART THROMBECTOMY/INFUSION INTRACRANIAL INC DIAG ANGIO  09/26/2023   IR US  GUIDE VASC ACCESS RIGHT  05/31/2020   IR US  GUIDE VASC ACCESS RIGHT  09/26/2023   LEFT HEART CATH AND CORONARY ANGIOGRAPHY N/A 10/08/2016   Procedure: Left Heart Cath and Coronary Angiography;  Surgeon: Verlin Lonni BIRCH, MD;  Location: [facility] INVASIVE CV LAB;  Service: Cardiovascular;  Laterality: N/A;   RADIOLOGY WITH ANESTHESIA N/A 05/31/2020   Procedure: IR WITH ANESTHESIA;  Surgeon: Radiologist, Medication, MD;  Location: [facility] OR;  Service: Radiology;  Laterality: N/A;   RADIOLOGY WITH ANESTHESIA N/A 09/05/2021   Procedure: RADIOLOGY WITH ANESTHESIA;  Surgeon: Radiologist, Medication, MD;  Location: [facility] OR;  Service: Radiology;  Laterality: N/A;   RADIOLOGY WITH ANESTHESIA N/A 09/26/2023    Procedure: RADIOLOGY WITH ANESTHESIA;  Surgeon: Radiologist, Medication, MD;  Location: [facility] OR;  Service: Radiology;  Laterality: N/A;   TOE SURGERY      Family History: Family History  Problem Relation Age of Onset   Hypertension Father     Social History  reports that he has been smoking cigarettes. He has never used smokeless tobacco. He reports current alcohol use. He reports current drug use. Drug: Marijuana.  No Known Allergies  Medications   Current Facility-Administered Medications:    [START ON 12/24/2023]  stroke: early stages of recovery book, , Does not apply, Once, Voncile Isles, MD   0.9 %  sodium chloride  infusion, , Intravenous, Continuous, Murlean Seelye, MD   acetaminophen  (TYLENOL ) tablet 650 mg, 650 mg, Oral, Q4H PRN **OR** acetaminophen  (TYLENOL ) 160 MG/5ML solution 650 mg, 650 mg, Per Tube, Q4H PRN **OR** acetaminophen  (TYLENOL ) suppository 650 mg, 650 mg, Rectal, Q4H PRN, Mark Gladson, MD   senna-docusate (Senokot-S) tablet 1 tablet, 1 tablet, Oral, QHS PRN, Serge Main, MD   sodium chloride  flush (NS) 0.9 % injection 3 mL, 3 mL, Intravenous, Once, Dreama Longs, MD  Current Outpatient Medications:    aspirin  EC 81 MG tablet, Take 1 tablet (81 mg total) by mouth daily. Swallow whole., Disp: 30 tablet, Rfl: 12   atorvastatin  (LIPITOR ) 40 MG tablet, Take 1 tablet (40 mg total) by mouth daily., Disp: 60 tablet, Rfl: 0   clopidogrel  (PLAVIX ) 75 MG tablet, Take 1 tablet (75 mg total) by mouth daily., Disp: 21 tablet, Rfl: 0   nicotine  (NICODERM CQ  - DOSED IN MG/24 HOURS) 14 mg/24hr patch, Place 1 patch (14 mg total) onto the skin daily., Disp: 28 patch, Rfl: 0  Facility-Administered Medications Ordered in Other Encounters:    dexamethasone  (DECADRON ) injection, , Intravenous, Anesthesia Intra-op, Jama Moats B, CRNA, 4 mg at 12/23/23 2005   fentaNYL  (SUBLIMAZE ) injection, , Intravenous, Anesthesia Intra-op, Jama Moats NOVAK, CRNA, 100 mcg at 12/23/23 1952    lactated ringers  infusion, , Intravenous, Continuous PRN, Jama Moats NOVAK, CRNA, New Bag at 12/23/23 1941   lidocaine  (cardiac) 100 mg/46mL (XYLOCAINE ) injection 2%, , Tracheal Tube, Anesthesia Intra-op, Jama Moats NOVAK, CRNA, 60 mg at 12/23/23 1953   ondansetron  (ZOFRAN ) injection, , Intravenous, Anesthesia Intra-op, Jama Moats B, CRNA, 4 mg at 12/23/23 2005   propofol  (DIPRIVAN ) 10 mg/mL bolus/IV push, , Intravenous, Anesthesia Intra-op, Jama Moats B, CRNA, 100 mg at 12/23/23 1954   rocuronium  (ZEMURON ) injection, , Intravenous, Anesthesia Intra-op, Jama Moats NOVAK, CRNA, 40 mg at 12/23/23 2005   succinylcholine  (ANECTINE ) syringe, , Intravenous, Anesthesia Intra-op, Jama Moats NOVAK, CRNA, 70 mg at 12/23/23 1956  Vitals   There were no vitals filed for  this visit.  There is no height or weight on file to calculate BMI. Pressure 148/82, heart rate 75, oxygen saturation 100% on room air  Physical Exam  General: Awake alert in no distress HEENT:-Neck in c-collar, normocephalic atraumatic CVs: Regular rhythm Abdomen nondistended nontender Respiratory: Breathing well saturating normally on room air Neurological exam Awake alert oriented x 3 Mild dysarthria No aphasia Cranial nerves II to XII: Pupils equal round react light, extraocular movements intact, visual fields appear full, mild facial asymmetry with left no facial weakness although difficult to assess with a c-collar in place, tongue and palate midline. Motor examination: Nearly flaccid left upper and lower extremity.  Full strength right upper extremity.  Right lower extremity limited by pain drifts before 5 seconds  Labs/Imaging/Neurodiagnostic studies   CBC:  Recent Labs  Lab 01-16-2024 1908 Jan 16, 2024 1913  WBC 4.1  --   NEUTROABS 2.2  --   HGB 13.3 14.6  HCT 40.8 43.0  MCV 86.4  --   PLT 188  --    Basic Metabolic Panel:  Lab Results  Component Value Date   NA 139 January 16, 2024   K 3.7 January 16, 2024   CO2 23 01-16-2024    GLUCOSE 108 (H) January 16, 2024   BUN 16 01/16/2024   CREATININE 1.00 January 16, 2024   CALCIUM  8.7 (L) 2024/01/16   GFRNONAA >60 16-Jan-2024   GFRAA >60 06/01/2019   Lipid Panel:  Lab Results  Component Value Date   LDLCALC 105 (H) 09/27/2023   HgbA1c:  Lab Results  Component Value Date   HGBA1C 5.2 09/27/2023   Urine Drug Screen:     Component Value Date/Time   LABOPIA NONE DETECTED 09/27/2023 1029   COCAINSCRNUR POSITIVE (A) 09/27/2023 1029   LABBENZ NONE DETECTED 09/27/2023 1029   AMPHETMU NONE DETECTED 09/27/2023 1029   THCU NONE DETECTED 09/27/2023 1029   LABBARB NONE DETECTED 09/27/2023 1029    Alcohol Level     Component Value Date/Time   [facility] <15 01-16-2024 1908   INR  Lab Results  Component Value Date   INR 1.0 01/16/24   APTT  Lab Results  Component Value Date   APTT 29 01-16-2024   CT Head without contrast(Personally reviewed): No acute findings.  Chronic right MCA encephalomalacia.  CT angio Head and Neck with contrast(Personally reviewed): Persistent right MCA severe attenuation.  100 mL area of ischemic penumbra in the right MCA and right ACA territory  ASSESSMENT   Mark Herrera is a 64 y.o. male previous strokes with most recent stroke less than 3 months ago in the left MCA territory status post endovascular revascularization of the left M1 M2 occlusion, polysubstance abuse, apparently for sudden onset of left-sided flaccid paralysis. NIH stroke scale 11. No TNK given due to recent stroke CT angiography head and neck with similar occlusion of the right MCA territory but on perfusion, which was done to get more information given the prior disease vessel, there appears to be a large right MCA/ACA territory penumbra--with his history of polysubstance abuse, could be related to vasospastic etiology rather than more of a thromboembolic etiology. Case was discussed with neurointerventionist and the patient's son over the phone. Risk benefits and  alternatives were discussed with the son-he agreed to proceed after consideration of above. This was discussed in a three-way call with [facility], patient's son and Dr. Dolphus Taken for IR.  Will be admitted to ICU for post intervention care  Impression: Acute right ischemic stroke  RECOMMENDATIONS  Admit to neuro ICU post intervention  Post IR blood pressure parameters per neuro IR 2D echo A1c Lipid Telemetry Check CBC BMP again in the morning MRI of the brain without contrast to evaluate for the extent of stroke Therapy assessments in the morning  Full code Docusate senna PPI   Case discussed with ED providers Dr. Dreama and Lavanda Lesches, PA-C as well as Dr. Dolphus, neurointerventional radiology.  Plan discussed with son Mark Herrera over the phone, who consented for the procedure after consideration of risks benefits and alternatives.  Imaging personally reviewed and also reviewed with radiology prior to decision making for treatment  Stroke team to follow ______________________________________________________________________    Signed, Eligio Lav, MD [facility]   CRITICAL CARE ATTESTATION Performed by: Eligio Lav, MD Total critical care time: 62 minutes Critical care time was exclusive of separately billable procedures and treating other patients and/or supervising APPs/Residents/Students Critical care was necessary to treat or prevent imminent or life-threatening deterioration. This patient is critically ill and at significant risk for neurological worsening and/or death and care requires constant monitoring. Critical care was time spent personally by me on the following activities: development of treatment plan with patient and/or surrogate as well as nursing, discussions with consultants, evaluation of patient's response to treatment, examination of patient, obtaining history from patient or surrogate, ordering and performing treatments  and interventions, ordering and review of laboratory studies, ordering and review of radiographic studies, pulse oximetry, re-evaluation of patient's condition, participation in multidisciplinary rounds and medical decision making of high complexity in the care of this patient.

## 2023-12-23 NOTE — ED Provider Notes (Signed)
 [facility] Provider Note   CSN: [id number] Arrival date & time: 12/23/23  8095     Patient presents with: No chief complaint on file.   Mark Herrera is a 63 y.o. male who presents via EMS for code stroke.  Last known well was 6:00 PM tonight.  He has a past medical history of multiple previous strokes and polysubstance including cocaine abuse.  EMS reports that at 6:00 he was sitting in a chair and abruptly fell out of it.  He was helped back into the chair and he was noted to have new onset left-sided weakness, left-sided facial droop and slurred speech.  EMS was called and initiated code stroke.  Upon arrival patient has persistent symptoms.  He has a history of right sided weakness previous left-sided strokes and appears to have some spastic hemiparesis.  Patient seen with stroke team.  {Add pertinent medical, surgical, social history, OB history to HPI:32947} HPI     Prior to Admission medications   Medication Sig Start Date End Date Taking? Authorizing Provider  aspirin  EC 81 MG tablet Take 1 tablet (81 mg total) by mouth daily. Swallow whole. 09/28/23   Judithe Rocky BROCKS, NP  atorvastatin  (LIPITOR ) 40 MG tablet Take 1 tablet (40 mg total) by mouth daily. 10/28/23   Gayland Lauraine JINNY, NP  clopidogrel  (PLAVIX ) 75 MG tablet Take 1 tablet (75 mg total) by mouth daily. 09/28/23   Lehner, Erin C, NP  nicotine  (NICODERM CQ  - DOSED IN MG/24 HOURS) 14 mg/24hr patch Place 1 patch (14 mg total) onto the skin daily. 09/28/23   Lehner, Erin C, NP    Allergies: Patient has no known allergies.    Review of Systems  Updated Vital Signs There were no vitals taken for this visit.  Physical Exam Vitals and nursing note reviewed.  Constitutional:      General: He is not in acute distress.    Appearance: He is well-developed. He is not diaphoretic.  HENT:     Head: Normocephalic and atraumatic.  Eyes:     General: No scleral icterus.     Conjunctiva/sclera: Conjunctivae normal.  Cardiovascular:     Rate and Rhythm: Normal rate and regular rhythm.     Heart sounds: Normal heart sounds.  Pulmonary:     Effort: Pulmonary effort is normal. No respiratory distress.     Breath sounds: Normal breath sounds.  Abdominal:     Palpations: Abdomen is soft.     Tenderness: There is no abdominal tenderness.  Musculoskeletal:     Cervical back: Normal range of motion and neck supple.  Skin:    General: Skin is warm and dry.  Neurological:     Mental Status: He is alert.     Cranial Nerves: Dysarthria and facial asymmetry present.     Motor: Weakness and abnormal muscle tone present.     Comments: Patient appears to have spastic hemiparesis on the right side.  He has weakness and flaccid paralysis on the left.  Facial droop on the left with dysarthric speech.  Please see remainder of neurologic evaluation by Dr. Voncile  Psychiatric:        Behavior: Behavior normal.     (all labs ordered are listed, but only abnormal results are displayed) Labs Reviewed  I-STAT CHEM 8, ED - Abnormal; Notable for the following components:      Result Value   Glucose, Bld 108 (*)    Calcium , Ion 1.07 (*)  All other components within normal limits  CBG MONITORING, ED - Abnormal; Notable for the following components:   Glucose-Capillary 114 (*)    All other components within normal limits  PROTIME-INR  APTT  CBC  DIFFERENTIAL  ETHANOL  COMPREHENSIVE METABOLIC PANEL WITH GFR    EKG: None  Radiology: CT HEAD CODE STROKE WO CONTRAST Result Date: 12/23/2023 CLINICAL DATA:  Code stroke.  Neuro deficit, acute, stroke suspected EXAM: CT HEAD WITHOUT CONTRAST TECHNIQUE: Contiguous axial images were obtained from the base of the skull through the vertex without intravenous contrast. RADIATION DOSE REDUCTION: This exam was performed according to the departmental dose-optimization program which includes automated exposure control, adjustment of the  mA and/or kV according to patient size and/or use of iterative reconstruction technique. COMPARISON:  MRI of the head dated September 26, 2023. FINDINGS: Brain: Age-related cerebral volume loss. Encephalomalacia changes are again demonstrated within the right vital in temporal lobes and insular ribbon. There is no evidence of hemorrhage, mass or acute cortical infarct. Vascular: Negative. Skull: Intact and unremarkable. Sinuses/Orbits: Clear paranasal sinuses.  Normal orbits. Other: None. IMPRESSION: 1. Chronic encephalomalacia changes within the right MCA distribution. No apparent acute process. 2. ASPECTS is 10. 3. These results were communicated to Dr. Arora at 7:19 pm on 12/23/2023 by text page via the [facility] messaging system. Electronically Signed   By: Evalene Coho M.D.   On: 12/23/2023 19:21    {Document cardiac monitor, telemetry assessment procedure when appropriate:32947} Procedures   Medications Ordered in the ED  sodium chloride  flush (NS) 0.9 % injection 3 mL (has no administration in time range)  iohexol  (OMNIPAQUE ) 350 MG/ML injection 100 mL (100 mLs Intravenous Contrast Given 12/23/23 1930)    Clinical Course as of 12/23/23 1940  Tue Dec 23, 2023  1937 Basophils Absolute: 0.0 [AH]    Clinical Course User Index [AH] Arloa Chroman, PA-C   {Click here for ABCD2, HEART and other calculators REFRESH Note before signing:1}                              Medical Decision Making Amount and/or Complexity of Data Reviewed Labs: ordered. Radiology: ordered.  Risk Prescription drug management.   ***  {Document critical care time when appropriate  Document review of labs and clinical decision tools ie CHADS2VASC2, etc  Document your independent review of radiology images and any outside records  Document your discussion with family members, caretakers and with consultants  Document social determinants of health affecting pt's care  Document your decision making why or why not  admission, treatments were needed:32947:::1}   Final diagnoses:  None    ED Discharge Orders     None

## 2023-12-23 NOTE — ED Triage Notes (Signed)
 Pt bibgcems from home pt had a fall and family picked him up thery noticed left sided facial droop, left sided weakness and slurred speech. Rigth side weak at baseline from previous stroke.  136/90 62 hr 101 cbg 98% RA

## 2023-12-23 NOTE — Anesthesia Postprocedure Evaluation (Signed)
 Anesthesia Post Note  Patient: Mark Herrera  Procedure(s) Performed: RADIOLOGY WITH ANESTHESIA     Patient location during evaluation: PACU Anesthesia Type: General Level of consciousness: awake Pain management: pain level controlled Vital Signs Assessment: post-procedure vital signs reviewed and stable Respiratory status: spontaneous breathing, nonlabored ventilation and respiratory function stable Cardiovascular status: blood pressure returned to baseline and stable Postop Assessment: no apparent nausea or vomiting Anesthetic complications: no   No notable events documented.  Last Vitals:  Vitals:   12/23/23 2235 12/23/23 2300  BP: 130/80 (!) 152/89  Pulse: (!) 51 67  Resp: 17 13  Temp: 36.6 C 36.8 C  SpO2: 100% 93%    Last Pain:  Vitals:   12/23/23 2300  TempSrc: Oral  PainSc: 0-No pain                 Naima Veldhuizen P Heberto Sturdevant

## 2023-12-23 NOTE — Progress Notes (Signed)
 eLink Physician-Brief Progress Note Patient Name: Mark Herrera DOB: 06/21/1960 MRN: [id number]   Date of Service  12/23/2023  HPI/Events of Note  Patient admitted as a Code Stroke with left sided weakness and s/p IR thrombectomy of a right A2 ACA occlusion.  eICU Interventions  New Patient Evaluation.        Delvina Mizzell U Reaghan Kawa 12/23/2023, 11:34 PM

## 2023-12-23 NOTE — Progress Notes (Signed)
 C Spine cleared per Dr. Voncile, C Collar removed.

## 2023-12-23 NOTE — Anesthesia Preprocedure Evaluation (Addendum)
 Anesthesia Evaluation  Patient identified by MRN, date of birth, ID band Patient awake    Reviewed: Unable to perform ROS - Chart review onlyPreop documentation limited or incomplete due to emergent nature of procedure.  Airway Mallampati: II  TM Distance: >3 FB Neck ROM: Full    Dental  (+) Poor Dentition, Chipped, Missing   Pulmonary Current Smoker   Pulmonary exam normal        Cardiovascular hypertension, + CAD, + Past MI, + Cardiac Stents and +CHF  Normal cardiovascular exam  ECHO: 1. Left ventricular ejection fraction, by estimation, is 30 to 35%. The  left ventricle has moderately decreased function. The left ventricle  demonstrates regional wall motion abnormalities (see scoring  diagram/findings for description). The left  ventricular internal cavity size was mildly dilated. Left ventricular  diastolic parameters are consistent with Grade I diastolic dysfunction  (impaired relaxation).   2. Right ventricular systolic function is normal. The right ventricular  size is normal.   3. The mitral valve is normal in structure. Mild mitral valve  regurgitation. No evidence of mitral stenosis.   4. The aortic valve is normal in structure. Aortic valve regurgitation is  not visualized. No aortic stenosis is present.   5. The inferior vena cava is normal in size with greater than 50%  respiratory variability, suggesting right atrial pressure of 3 mmHg.     Neuro/Psych  PSYCHIATRIC DISORDERS      CVA    GI/Hepatic ,,,(+)     substance abuse    Endo/Other    Renal/GU      Musculoskeletal   Abdominal   Peds  Hematology   Anesthesia Other Findings code stroke  Reproductive/Obstetrics                              Anesthesia Physical Anesthesia Plan  ASA: 4 and emergent  Anesthesia Plan: General   Post-op Pain Management:    Induction: Intravenous and Rapid sequence  PONV Risk  Score and Plan: 1 and Ondansetron , Dexamethasone  and Treatment may vary due to age or medical condition  Airway Management Planned: Oral ETT and Video Laryngoscope Planned  Additional Equipment:   Intra-op Plan:   Post-operative Plan: Possible Post-op intubation/ventilation  Informed Consent:      Only emergency history available  Plan Discussed with: CRNA  Anesthesia Plan Comments:          Anesthesia Quick Evaluation

## 2023-12-23 NOTE — Code Documentation (Addendum)
 Stroke Response Nurse Documentation Code Documentation  Mark Herrera is a 63 y.o. male arriving to [facility]  via [facility] on 12/23/2023 with past medical hx of prior CVAs. On aspirin  81 mg daily and clopidogrel  75 mg daily. Code stroke was activated by EMS.   Patient from home where he was LKW at 1800 and now complaining of left sided weakness and numbness.  Stroke team at the bedside on patient arrival. Labs drawn and patient cleared for CT by EDP Patient to CT with team. NIHSS 9, see documentation for details and code stroke times. Patient with left arm weakness, left leg weakness, left decreased sensation, and dysarthria  on exam. The following imaging was completed:  CT Head, CTA, and CTP. Patient is not a candidate for IV Thrombolytic due to recent stroke. Patient is a candidate for IR due to Rt M1 occlusion.   Care Plan: per post IR order set  Process Delays Noted: After hours, team called in  Bedside handoff with IR RN complete   Shanon Richardson Colt  Stroke Response RN

## 2023-12-23 NOTE — Procedures (Signed)
 INR  Status post right common carotid arteriogram.  Findings.  1.  Occluded right anterior cerebral artery mid A2 segment.    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  Status post complete revascularization of the occluded right anterior cerebral A2 segment with 1 pass with contact aspiration, and 1 pass with contact aspiration with a 4 mm x 40 mm solitaireX retriever device achieving a  TICI 3 revascularization.  Post CT brain no evidence of intracranial hemorrhage.  8 French Angio-Seal closure device deployed at the right groin puncture site.  Distal pulses present unchanged from prior to the  procedure.  Patient extubated.  Moving right side spontaneously.  Attempting to move left lower extremity distally.  Agitated.  Not following simple commands as yet.  GORMAN Nash MD.

## 2023-12-23 NOTE — Anesthesia Procedure Notes (Signed)
 Procedure Name: Intubation Date/Time: 12/23/2023 7:54 PM  Performed by: Jama Powell NOVAK, CRNAPre-anesthesia Checklist: Patient identified, Timeout performed, Emergency Drugs available, Suction available and Patient being monitored Patient Re-evaluated:Patient Re-evaluated prior to induction Oxygen Delivery Method: Circle system utilized Preoxygenation: Pre-oxygenation with 100% oxygen Induction Type: IV induction and Rapid sequence Grade View: Grade I Tube type: Oral Tube size: 7.5 mm Placement Confirmation: ETT inserted through vocal cords under direct vision, positive ETCO2, breath sounds checked- equal and bilateral and CO2 detector Secured at: 23 cm Tube secured with: Tape Dental Injury: Teeth and Oropharynx as per pre-operative assessment  Comments: Pt arrived to IR suite with C collar in place.  Imaging obtained prior to arrival.  Neurologist confirmed with radiologist that C spine was cleared.  Collar was removed.

## 2023-12-23 NOTE — Transfer of Care (Signed)
 Immediate Anesthesia Transfer of Care Note  Patient: Mark Herrera  Procedure(s) Performed: RADIOLOGY WITH ANESTHESIA  Patient Location: PACU  Anesthesia Type:General  Level of Consciousness: awake and alert   Airway & Oxygen Therapy: Patient Spontanous Breathing and Patient connected to nasal cannula oxygen  Post-op Assessment: Report given to RN and Post -op Vital signs reviewed and stable  Post vital signs: Reviewed and stable  Last Vitals:  Vitals Value Taken Time  BP 165/105   Temp 97.1   Pulse 72   Resp 20   SpO2 94     Last Pain: There were no vitals filed for this visit.       Complications: No notable events documented.

## 2023-12-24 ENCOUNTER — Inpatient Hospital Stay (HOSPITAL_COMMUNITY)

## 2023-12-24 ENCOUNTER — Encounter (HOSPITAL_COMMUNITY): Payer: Self-pay | Admitting: Student in an Organized Health Care Education/Training Program

## 2023-12-24 ENCOUNTER — Other Ambulatory Visit: Payer: Self-pay

## 2023-12-24 DIAGNOSIS — I63521 Cerebral infarction due to unspecified occlusion or stenosis of right anterior cerebral artery: Secondary | ICD-10-CM | POA: Diagnosis not present

## 2023-12-24 DIAGNOSIS — R29700 NIHSS score 0: Secondary | ICD-10-CM | POA: Diagnosis not present

## 2023-12-24 DIAGNOSIS — F1411 Cocaine abuse, in remission: Secondary | ICD-10-CM | POA: Diagnosis not present

## 2023-12-24 DIAGNOSIS — I6389 Other cerebral infarction: Secondary | ICD-10-CM

## 2023-12-24 DIAGNOSIS — I739 Peripheral vascular disease, unspecified: Secondary | ICD-10-CM

## 2023-12-24 DIAGNOSIS — I7090 Unspecified atherosclerosis: Secondary | ICD-10-CM

## 2023-12-24 DIAGNOSIS — E785 Hyperlipidemia, unspecified: Secondary | ICD-10-CM

## 2023-12-24 DIAGNOSIS — F17210 Nicotine dependence, cigarettes, uncomplicated: Secondary | ICD-10-CM

## 2023-12-24 LAB — RAPID URINE DRUG SCREEN, HOSP PERFORMED
Amphetamines: NOT DETECTED
Barbiturates: NOT DETECTED
Benzodiazepines: NOT DETECTED
Cocaine: NOT DETECTED
Opiates: NOT DETECTED
Tetrahydrocannabinol: NOT DETECTED

## 2023-12-24 LAB — CBC WITH DIFFERENTIAL/PLATELET
Abs Immature Granulocytes: 0.01 K/uL (ref 0.00–0.07)
Basophils Absolute: 0 K/uL (ref 0.0–0.1)
Basophils Relative: 0 %
Eosinophils Absolute: 0 K/uL (ref 0.0–0.5)
Eosinophils Relative: 0 %
HCT: 36.5 % — ABNORMAL LOW (ref 39.0–52.0)
Hemoglobin: 12.1 g/dL — ABNORMAL LOW (ref 13.0–17.0)
Immature Granulocytes: 0 %
Lymphocytes Relative: 15 %
Lymphs Abs: 0.5 K/uL — ABNORMAL LOW (ref 0.7–4.0)
MCH: 28.2 pg (ref 26.0–34.0)
MCHC: 33.2 g/dL (ref 30.0–36.0)
MCV: 85.1 fL (ref 80.0–100.0)
Monocytes Absolute: 0.2 K/uL (ref 0.1–1.0)
Monocytes Relative: 5 %
Neutro Abs: 2.8 K/uL (ref 1.7–7.7)
Neutrophils Relative %: 80 %
Platelets: 184 K/uL (ref 150–400)
RBC: 4.29 MIL/uL (ref 4.22–5.81)
RDW: 14.7 % (ref 11.5–15.5)
WBC: 3.5 K/uL — ABNORMAL LOW (ref 4.0–10.5)
nRBC: 0 % (ref 0.0–0.2)

## 2023-12-24 LAB — BASIC METABOLIC PANEL WITH GFR
Anion gap: 9 (ref 5–15)
BUN: 10 mg/dL (ref 8–23)
CO2: 22 mmol/L (ref 22–32)
Calcium: 8.6 mg/dL — ABNORMAL LOW (ref 8.9–10.3)
Chloride: 106 mmol/L (ref 98–111)
Creatinine, Ser: 0.93 mg/dL (ref 0.61–1.24)
GFR, Estimated: >60 mL/min (ref >60)
Glucose, Bld: 178 mg/dL — ABNORMAL HIGH (ref 70–99)
Potassium: 4.1 mmol/L (ref 3.5–5.1)
Sodium: 137 mmol/L (ref 135–145)

## 2023-12-24 LAB — ECHOCARDIOGRAM COMPLETE
Area-P 1/2: 4.21 cm2
Height: 68 in
S' Lateral: 4.9 cm
Weight: 2536.17 [oz_av]

## 2023-12-24 LAB — LIPID PANEL
Cholesterol: 130 mg/dL (ref 0–200)
HDL: 44 mg/dL (ref >40)
LDL Cholesterol: 81 mg/dL (ref 0–99)
Total CHOL/HDL Ratio: 3 ratio
Triglycerides: 25 mg/dL (ref <150)
VLDL: 5 mg/dL (ref 0–40)

## 2023-12-24 LAB — HEMOGLOBIN A1C
Hgb A1c MFr Bld: 5.4 % (ref 4.8–5.6)
Mean Plasma Glucose: 108.28 mg/dL

## 2023-12-24 LAB — MRSA NEXT GEN BY PCR, NASAL: MRSA by PCR Next Gen: NOT DETECTED

## 2023-12-24 MED ORDER — PERFLUTREN LIPID MICROSPHERE
1.0000 mL | INTRAVENOUS | Status: AC | PRN
  Administered 2023-12-24: 2 mL via INTRAVENOUS

## 2023-12-24 MED ORDER — CLOPIDOGREL BISULFATE 75 MG PO TABS
75.0000 mg | ORAL_TABLET | Freq: Every day | ORAL | Status: DC
  Administered 2023-12-24 – 2023-12-25 (×2): 75 mg via ORAL
  Filled 2023-12-24 (×2): qty 1

## 2023-12-24 MED ORDER — ASPIRIN 81 MG PO CHEW
81.0000 mg | CHEWABLE_TABLET | Freq: Every day | ORAL | Status: DC
  Administered 2023-12-24 – 2023-12-25 (×2): 81 mg via ORAL
  Filled 2023-12-24 (×2): qty 1

## 2023-12-24 MED ORDER — ATORVASTATIN CALCIUM 80 MG PO TABS
80.0000 mg | ORAL_TABLET | Freq: Every day | ORAL | Status: DC
  Administered 2023-12-24 – 2023-12-25 (×2): 80 mg via ORAL
  Filled 2023-12-24 (×2): qty 1

## 2023-12-24 NOTE — Progress Notes (Signed)
  Echocardiogram 2D Echocardiogram has been performed.  Mark Herrera, RDCS 12/24/2023, 2:38 PM

## 2023-12-24 NOTE — Progress Notes (Addendum)
 STROKE TEAM PROGRESS NOTE    SIGNIFICANT HOSPITAL EVENTS  7/22: Patient admitted with a right ACA stroke, mechanical thrombectomy performed of right ACA A2 segment  INTERIM HISTORY/SUBJECTIVE  Patient remains hemodynamically stable and afebrile overnight.  He reports that he still has some left-sided weakness but that is improving.  OBJECTIVE  CBC    Component Value Date/Time   WBC 3.5 (L) 12/24/2023 0613   RBC 4.29 12/24/2023 0613   HGB 12.1 (L) 12/24/2023 0613   HCT 36.5 (L) 12/24/2023 0613   PLT 184 12/24/2023 0613   MCV 85.1 12/24/2023 0613   MCH 28.2 12/24/2023 0613   MCHC 33.2 12/24/2023 0613   RDW 14.7 12/24/2023 0613   LYMPHSABS 0.5 (L) 12/24/2023 0613   MONOABS 0.2 12/24/2023 0613   EOSABS 0.0 12/24/2023 0613   BASOSABS 0.0 12/24/2023 0613    BMET    Component Value Date/Time   NA 137 12/24/2023 0613   K 4.1 12/24/2023 0613   CL 106 12/24/2023 0613   CO2 22 12/24/2023 0613   GLUCOSE 178 (H) 12/24/2023 0613   BUN 10 12/24/2023 0613   CREATININE 0.93 12/24/2023 0613   CALCIUM  8.6 (L) 12/24/2023 0613   GFRNONAA >60 12/24/2023 0613    IMAGING past 24 hours MR ANGIO HEAD WO CONTRAST Result Date: 12/24/2023 EXAM: MR Angiography Head without intravenous Contrast. 12/24/2023 01:06:00 AM TECHNIQUE: Magnetic resonance angiography images of the head without intravenous contrast. Multiplanar 2D and 3D reformatted images are provided for review. COMPARISON: None provided. CLINICAL HISTORY: Stroke, follow up. Pt unable to complete exam due to pain. FINDINGS: ANTERIOR CIRCULATION: No significant stenosis of the internal carotid arteries. No significant stenosis of the anterior cerebral arteries. No significant stenosis of the middle cerebral arteries. No aneurysm. POSTERIOR CIRCULATION: No significant stenosis of the posterior cerebral arteries. No significant stenosis of the basilar artery. No significant stenosis of the vertebral arteries. No aneurysm. IMPRESSION: 1. No  significant stenosis of the intracranial vasculature. Electronically signed by: Franky Stanford MD 12/24/2023 01:44 AM EDT RP Workstation: [id number]   MR BRAIN WO CONTRAST Result Date: 12/24/2023 EXAM: MRI BRAIN WITHOUT CONTRAST 12/24/2023 01:05:44 AM TECHNIQUE: Multiplanar multisequence MRI of the head/brain was performed without the administration of intravenous contrast. COMPARISON: 12/26/2023 CLINICAL HISTORY: Stroke, follow up. FINDINGS: BRAIN AND VENTRICLES: There is a small focus of abnormal diffusion restriction within the anterior right frontal white matter. Old right MCA (middle cerebral artery) territory infarct with encephalomalacia, gliosis and chronic blood products. Multifocal hyperintense T2-weighted signal within the cerebral white matter, most commonly due to chronic small vessel disease. No midline shift. No hydrocephalus. The sella is unremarkable. Normal flow voids. ORBITS: No acute abnormality. SINUSES AND MASTOIDS: No acute abnormality. BONES AND SOFT TISSUES: Normal marrow signal. No acute soft tissue abnormality. IMPRESSION: 1. Small focus of acute ischemia within the anterior right frontal white matter. 2. Old right MCA territory infarct with encephalomalacia, gliosis, and chronic blood products. 3. Multifocal hyperintense T2-weighted signal within the cerebral white matter, most commonly due to chronic small vessel disease. Electronically signed by: Franky Stanford MD 12/24/2023 01:41 AM EDT RP Workstation: [id number]   CT C-SPINE NO CHARGE Result Date: 12/23/2023 EXAM: CT CERVICAL SPINE WITHOUT CONTRAST 12/23/2023 07:30:03 PM TECHNIQUE: CT of the cervical spine was performed without the administration of intravenous contrast. Multiplanar reformatted images are provided for review. Automated exposure control, iterative reconstruction, and/or weight based adjustment of the mA/kV was utilized to reduce the radiation dose to as low as reasonably achievable. COMPARISON: None available.  CLINICAL HISTORY: FINDINGS: CERVICAL SPINE: BONES AND ALIGNMENT: Reversal of normal cervical lordosis. Degenerative grade 1 retrolisthesis at C3-4 and C4-5. DEGENERATIVE CHANGES: Mild C3-4 spinal canal stenosis with severe right C4 foraminal stenosis. Severe right C6 foraminal stenosis due to osteophyte complex. SOFT TISSUES: No prevertebral soft tissue swelling. IMPRESSION: 1. No acute fracture of the cervical spine. 2. Mild C3-4 spinal canal stenosis with severe right C4 foraminal stenosis. 3. Severe right C6 foraminal stenosis due to disc osteophyte complex. Electronically signed by: Franky Stanford MD 12/23/2023 08:00 PM EDT RP Workstation: [id number]   CT ANGIO HEAD NECK W WO CM W PERF (CODE STROKE) Result Date: 12/23/2023 EXAM: CTA Head and Neck with Perfusion 12/23/2023 07:30:03 PM TECHNIQUE: CTA of the head and neck was performed with the administration of intravenous contrast. 3D postprocessing with multiplanar reconstructions and MIPs was performed to evaluate the vascular anatomy. Automated exposure control, iterative reconstruction, and/or weight based adjustment of the mA/kV was utilized to reduce the radiation dose to as low as reasonably achievable. COMPARISON: 09/26/2023 CLINICAL HISTORY: Neuro deficit, acute, stroke suspected. Code stroke FINDINGS: CTA NECK: AORTIC ARCH AND ARCH VESSELS: Mild calcific aortic atherosclerosis. No dissection or arterial injury. No significant stenosis of the brachiocephalic or subclavian arteries. CERVICAL CAROTID ARTERIES: No dissection, arterial injury, or hemodynamically significant stenosis by NASCET criteria. There is mild atherosclerotic calcification of the internal carotid arteries at the skull base. CERVICAL VERTEBRAL ARTERIES: Moderate narrowing of the left vertebral artery origin due to calcific atherosclerosis. Vertebral arteries are otherwise normal. No dissection or arterial injury. LUNGS AND MEDIASTINUM: Unremarkable. SOFT TISSUES: No acute abnormality.  BONES: No acute abnormality. CTA HEAD: ANTERIOR CIRCULATION: No significant stenosis of the internal carotid arteries. No significant stenosis of the anterior cerebral arteries. Attenuated appearance of the right MCA M2 branches, which is unchanged. No aneurysm. POSTERIOR CIRCULATION: No significant stenosis of the posterior cerebral arteries. No significant stenosis of the basilar artery. No significant stenosis of the vertebral arteries. No aneurysm. OTHER: No dural venous sinus thrombosis on this non-dedicated study. CT PERFUSION: 100 ml area of ischemic penumbra calculated predominantly within the right frontal lobe. Areas of ischemia identified in the inferior frontal poles are likely artifactual. No core infarct identified. EXAM QUALITY: Exam quality is adequate with diagnostic perfusion maps. No significant motion artifact. Appropriate arterial inflow and venous outflow curves. CORE INFARCT (CBF<30% volume): 0 mL TOTAL HYPOPERFUSION (Tmax>6s volume): 100 mL PENUMBRA: Mismatch volume: 100 mL Mismatch ratio: Not applicable Location: Predominantly within the right frontal lobe IMPRESSION: 1. No acute large vessel occlusion. Unchanged appearance of chronic R MCA severe attenuation. 2. No core infarct identified. 3. 100 ml area of ischemic penumbra calculated predominantly within the right frontal lobe. This is overestimated based on inclusion of the inferior frontal poles, but the findings in the distal MCA territory are likely legitimate. This pattern might be explained by vasospasm. 4. Moderate narrowing of the left vertebral artery origin due to calcific atherosclerosis. Discussed with Dr. Voncile at 7:55pm. Electronically signed by: Franky Stanford MD 12/23/2023 07:56 PM EDT RP Workstation: [id number]   CT HEAD CODE STROKE WO CONTRAST Result Date: 12/23/2023 CLINICAL DATA:  Code stroke.  Neuro deficit, acute, stroke suspected EXAM: CT HEAD WITHOUT CONTRAST TECHNIQUE: Contiguous axial images were obtained from  the base of the skull through the vertex without intravenous contrast. RADIATION DOSE REDUCTION: This exam was performed according to the departmental dose-optimization program which includes automated exposure control, adjustment of the mA and/or kV according to patient size and/or use of  iterative reconstruction technique. COMPARISON:  MRI of the head dated September 26, 2023. FINDINGS: Brain: Age-related cerebral volume loss. Encephalomalacia changes are again demonstrated within the right vital in temporal lobes and insular ribbon. There is no evidence of hemorrhage, mass or acute cortical infarct. Vascular: Negative. Skull: Intact and unremarkable. Sinuses/Orbits: Clear paranasal sinuses.  Normal orbits. Other: None. IMPRESSION: 1. Chronic encephalomalacia changes within the right MCA distribution. No apparent acute process. 2. ASPECTS is 10. 3. These results were communicated to Dr. Arora at 7:19 pm on 12/23/2023 by text page via the [facility] messaging system. Electronically Signed   By: Evalene Coho M.D.   On: 12/23/2023 19:21    Vitals:   12/24/23 0700 12/24/23 0800 12/24/23 0900 12/24/23 1000  BP: (!) 101/52 (!) 116/97 139/81 129/72  Pulse: (!) 57 65 73 63  Resp: (!) 21 15 17 15   Temp:  98.3 F (36.8 C)    TempSrc:  Oral    SpO2: 98% 97% 100% 98%  Weight:      Height:         PHYSICAL EXAM General:  Alert, well-nourished, well-developed patient in no acute distress Psych:  Mood and affect appropriate for situation CV: Regular rate and rhythm on monitor Respiratory:  Regular, unlabored respirations on room air   NEURO:  Mental Status: AA&Ox3, patient is able to give clear and coherent history Speech/Language: speech is without dysarthria or aphasia.  fluency, and comprehension intact.  Cranial Nerves:  II: PERRL.   III, IV, VI: EOMI. Eyelids elevate symmetrically.  V: Sensation is intact to light touch and symmetrical to face.  VII: Face is symmetrical resting and smiling VIII:  hearing intact to voice. IX, X: Phonation is normal.  XII: tongue is midline without fasciculations. Motor: Able to move all 4 extremities with good antigravity strength but slight weakness of the left arm and leg Tone: is normal and bulk is normal Sensation- Intact to light touch bilaterally. Extinction absent to light touch to DSS.   Coordination: FTN intact bilaterally Gait- deferred  Most Recent NIH 0   ASSESSMENT/PLAN  Mark Herrera is a 63 y.o. male with history of multiple prior strokes with residual right hemiparesis, cocaine abuse, tobacco abuse, alcoholism, CAD and hyperlipidemia admitted for sudden onset left-sided weakness that he noticed after a fall.  He was found to have a large right MCA/ACA territory penumbra on CT perfusion, and he was taken to interventional radiology for revascularization of right ACA.  This was successful.  Patient states that he has been abstinent from cocaine, and UDS was negative this admission.  NIH on Admission 11  Stroke:  right frontal white matter small infarct with R A2 occlusion s/p IR with TICI3, etiology: Large vessel occlusion most likely in the setting of previous substance abuse Code Stroke CT head No acute abnormality.  Encephalomalacia within right MCA distribution ASPECTS 10.    CTA head & neck no acute LVO, right M2 chronic occlusion, moderate narrowing of left vertebral artery CT perfusion 100 mm area of ischemic penumbra within right frontal lobe, possibly explained by vasospasm S/p IR with acute R A2 occlusion s/p TICI3, old R M2 occlusion MRI small focus of acute ischemia within anterior right frontal white matter, old right MCA territory infarct with encephalomalacia gliosis and chronic blood products, chronic small vessel disease 2D Echo EF 30 to 35% LDL 81 HgbA1c 5.4 UDS negative VTE prophylaxis -SCDs aspirin  81 mg daily prior to admission, now on aspirin  81 mg daily  and clopidogrel  75 mg daily for 3 weeks and then  Plavix  alone. Therapy recommendations:  Outpatient PT/OT/ST Disposition: pending, likely home  Hx of Stroke/TIA 05/2019: Admitted for right side weakness.  Received tPA.  MRI negative.  Echo showed EF of 40 to 45%. LDL 99, and A1C 5.4. UDS cocaine and THC positive.  Discharged on DAPT and Lipitor  40.  At this time patient endorsed noncompliance with his medications 05/2020: Admitted for right MCA infarct with right M1 occlusion status post IR with TICI2c. EF 25-30%, LDL 84, A1C 5.4 and UDS positive for cocaine and THC. Discharged on DAPT for 3 weeks and lipitor  80. Recommend 30 day cardiac monitoring as outpt  09/2021: Admitted for dysarthria, left facial droop, left-sided weakness, status post TNK administration.  Echo showed EF of 30 to 35%.  MRI shows acute right MCA branch infarct. CTA  head and  neck showed R M2 occlusion. LDL 105 and A1C 5.3. UDS positive for cocaine and THC. Discharged on DAPT for 3 months, then asa alone as well as lipitor  40. Patient endorses medication non-compliance. 09/2023 admitted for right-sided weakness, speech difficulty and lethargy.  CTA head and neck showed abrupt cut off left M1/M2, chronic proximal right M2 occlusion.  CTP 0/68 cc, status post IR with left MCA TICI2c reperfusion.  MRI showed acute infarct involving left insular cortex as well as additional scattered acute cortical infarct involving left MCA territory.  EF 30 to 35%.  LDL 105, A1c 5.2, UDS positive for cocaine.  Discharged on DAPT for 3 weeks and then aspirin  alone.  Recommend 30-day CardioNet monitor as outpatient Patient has recently undergone 30-day heart monitor study, has not sent back the device for checking  CHF CAD and MI 2018 s/p stenting Acute anterior STEMI 2018 Total occlusion of proximal LAD, heavy thrombus S/P successful PTCA/DES x 1 to proximal LAD Placed on aspirin , Brilinta , Coreg , lisinopril , statin 09/2021 and 09/2023 EF 30-35% This admission 2D Echo EF 30-35%  Stable, continue  outpt follow up with cardiology Now on DAPT  Hypertension Home meds: None Stable Long-term BP goal normotensive  Hyperlipidemia Home meds: Atorvastatin  40 mg daily, increased to 80 LDL 81, goal < 70 Continue statin at discharge  Tobacco Abuse Patient smokes 1 packs per day       Ready to quit? No  Substance Abuse Patient uses marijuana and has extensive cocaine abuse history UDS negative this admission, patient states he has stopped using cocaine but continues using marijuana and does not wish to quit  Other Stroke Risk Factors Alcohol use  Other Active Problems Medication noncompliance  Hospital day # 1  Patient seen by NP with MD, MD to edit note as needed. Cortney E Everitt Clint Kill , MSN, AGACNP-BC [facility] See Amion for schedule and pager information 12/24/2023 11:36 AM   ATTENDING NOTE: I reviewed above note and agree with the assessment and plan. Pt was seen and examined.   Speech therapist at the bedside. Pt is awake, alert, eyes open, orientated to age, place, time and people. No aphasia, fluent language, following all simple commands. Able to name and repeat. No gaze palsy, tracking bilaterally, visual field full, PERRL. No facial droop. Tongue midline. LUE mild pronator drift, RUE 5/5. RLE 5/5, LLE proximal 5/5 but distal ankle DF 4+/5. Sensation symmetrical bilaterally, b/l FTN intact, gait not tested.   For detailed assessment and plan, please refer to above as I have made changes wherever appropriate.   Ary Cummins, MD PhD Stroke Neurology  12/24/2023 6:15 PM  This patient is critically ill due to stroke with R A2 occlusion s/p IR and at significant risk of neurological worsening, death form recurrent stroke, hemorrhagic conversion. This patient's care requires constant monitoring of vital signs, hemodynamics, respiratory and cardiac monitoring, review of multiple databases, neurological assessment, discussion with family, other specialists and  medical decision making of high complexity. I spent 40 minutes of neurocritical care time in the care of this patient.   To contact Stroke Continuity provider, please refer to [email or web address]. After hours, contact General Neurology

## 2023-12-24 NOTE — Discharge Summary (Shared)
 Stroke Discharge Summary  Patient ID: Mark Herrera   MRN: [id number]      DOB: 28-Nov-1960  Date of Admission: 12/23/2023 Date of Discharge: 12/25/2023  Attending Physician:  Jerri Pfeiffer MD Consultant(s):    None  Patient's PCP:  Medicine, [facility]  DISCHARGE PRIMARY DIAGNOSIS:   Stroke:  right frontal white matter small infarct with R A2 occlusion s/p IR with TICI3, etiology: Large vessel occlusion most likely in the setting of previous substance abuse   Secondary diagnosis Hx of stroke CHF CAD/MI s/p stenting HTN HLD Smoker Substance abuse   Allergies as of 12/25/2023   No Known Allergies      Medication List     STOP taking these medications    aspirin  EC 81 MG tablet Replaced by: aspirin  81 MG chewable tablet       TAKE these medications    aspirin  81 MG chewable tablet Chew 1 tablet (81 mg total) by mouth daily. Start taking on: December 26, 2023 Replaces: aspirin  EC 81 MG tablet   atorvastatin  80 MG tablet Commonly known as: LIPITOR  Take 1 tablet (80 mg total) by mouth daily. Start taking on: December 26, 2023 What changed:  medication strength how much to take   clopidogrel  75 MG tablet Commonly known as: PLAVIX  Take 1 tablet (75 mg total) by mouth daily. Start taking on: December 26, 2023   nicotine  14 mg/24hr patch Commonly known as: NICODERM CQ  - dosed in mg/24 hours Place 1 patch (14 mg total) onto the skin daily.   OVER THE COUNTER MEDICATION Take 1 capsule by mouth once a week. [employer]        LABORATORY STUDIES CBC    Component Value Date/Time   WBC 3.5 (L) 12/24/2023 0613   RBC 4.29 12/24/2023 0613   HGB 12.1 (L) 12/24/2023 0613   HCT 36.5 (L) 12/24/2023 0613   PLT 184 12/24/2023 0613   MCV 85.1 12/24/2023 0613   MCH 28.2 12/24/2023 0613   MCHC 33.2 12/24/2023 0613   RDW 14.7 12/24/2023 0613   LYMPHSABS 0.5 (L) 12/24/2023 0613   MONOABS 0.2 12/24/2023 0613   EOSABS 0.0 12/24/2023 0613   BASOSABS 0.0  12/24/2023 0613   CMP    Component Value Date/Time   NA 137 12/24/2023 0613   K 4.1 12/24/2023 0613   CL 106 12/24/2023 0613   CO2 22 12/24/2023 0613   GLUCOSE 178 (H) 12/24/2023 0613   BUN 10 12/24/2023 0613   CREATININE 0.93 12/24/2023 0613   CALCIUM  8.6 (L) 12/24/2023 0613   PROT 6.4 (L) 12/23/2023 1908   ALBUMIN 3.3 (L) 12/23/2023 1908   AST 18 12/23/2023 1908   ALT 14 12/23/2023 1908   ALKPHOS 70 12/23/2023 1908   BILITOT 0.8 12/23/2023 1908   GFRNONAA >60 12/24/2023 0613   GFRAA >60 06/01/2019 0553   COAGS Lab Results  Component Value Date   INR 1.0 12/23/2023   INR 1.0 09/26/2023   INR 1.1 11/11/2022   Lipid Panel    Component Value Date/Time   CHOL 130 12/24/2023 0613   TRIG 25 12/24/2023 0613   HDL 44 12/24/2023 0613   CHOLHDL 3.0 12/24/2023 0613   VLDL 5 12/24/2023 0613   LDLCALC 81 12/24/2023 0613   HgbA1C  Lab Results  Component Value Date   HGBA1C 5.4 12/24/2023   Alcohol Level    Component Value Date/Time   [facility] <15 12/23/2023 1908     SIGNIFICANT  DIAGNOSTIC STUDIES IR PERCUTANEOUS ART THROMBECTOMY/INFUSION INTRACRANIAL INC DIAG ANGIO Result Date: 12/25/2023 INDICATION: Acute onset of flaccid left sided upper and lower extremities. Occluded right anterior cerebral artery distal A2 segment. EXAM: 1. EMERGENT LARGE VESSEL OCCLUSION THROMBOLYSIS (anterior CIRCULATION) COMPARISON:  CT angiogram of the head and neck of December 23, 2023. MEDICATIONS: No antibiotic was administered within 1 hour of the procedure. ANESTHESIA/SEDATION: General anesthesia. CONTRAST:  Omnipaque  300 approximately 90 cc. FLUOROSCOPY TIME:  Fluoroscopy Time: 24 minutes 54 seconds (1499 mGy). COMPLICATIONS: None immediate. TECHNIQUE: Following a full explanation of the procedure along with the potential associated complications, an informed witnessed consent was obtained. The risks of intracranial hemorrhage of 10%, worsening neurological deficit, ventilator dependency, death and  inability to revascularize were all reviewed in detail with the patient's son. The patient was then put under general anesthesia by the Department of Anesthesiology at [facility]. The right groin was prepped and draped in the usual sterile fashion. Thereafter using modified Seldinger technique, transfemoral access into the right common femoral artery was obtained without difficulty. Over an 0.035 inch guidewire an 8 French 25 cm [place] Pinnacle sheath was inserted. Through this, and also over an 0.035 inch guidewire a combination of a 125 cm 6 [place] Berenstein support catheter inside of an 088 100 cm Zoom catheter was advanced to the aortic arch region and selectively positioned in the right common carotid artery and then the distal right ICA cavernous segment. FINDINGS: The right common carotid arteriogram demonstrates the right external carotid artery and its major branches to be widely patent. The right internal carotid artery at the bulb to the cranial skull base is patent. The petrous, the cavernous and the supraclinoid right ICA are widely patent. The left anterior cerebral artery demonstrates occlusion in the distal A2 segment. Right middle cerebral artery demonstrates a stump of a chronic occlusion of the inferior division of the right middle cerebral artery. PROCEDURE: Through the 088 Zoom catheter in the proximal cavernous segment, a combination of an 068 aspiration catheter with an 043 135 cm distal aspiration catheter was advanced over an 018 inch Aristotle micro guidewire with a moderate J configuration. The micro guidewire was then advanced through the occluded right anterior cerebral artery A2 segment followed by the 043 distal aspiration catheter. The guidewire was removed. Aspiration via a pump was then deployed for a couple of minutes through the 043 hub. This combination was removed. Control arteriogram performed through the 088 Zoom support catheter demonstrated no change in the  occluded right anterior cerebral artery distal A2 segment of the clot fragment was obtained. A second pass was then made using an 062 136 cm distal aspiration catheter advanced with an 021 160 cm microcatheter over an 018 inch Aristotle micro guidewire to the distal A1 segment. Micro guidewire was then gently advanced without difficulty to the distal A3 segment followed by the microcatheter. At this time, a 4 mm x 40 mm Solitaire X retrieval device was deployed in the usual manner after having verified safe positioning of the tip of the microcatheter. The 062 distal aspiration catheter was then advanced into the right anterior cerebral A2 segment into the proximal portion of the retrieval device. Constant aspiration was applied for 20 minutes at the hub of the 062 aspiration catheter followed by retrieval of the microcatheter the retrieval device, and the 062 aspiration catheter as constant aspiration was applied at the hub of the 088 Zoom support catheter. A control arteriogram performed through the Zoom 088 aspiration catheter  demonstrated complete revascularization of the right anterior cerebral artery achieving a TICI 3 revascularization. Mild spasm of the proximal right A2 responded to 2 aliquots of 25 mcg of nitroglycerin  intra-arterially. The 062 aspiration catheter was then advanced into the proximal right middle cerebral M1 segment through which an 021 microcatheter was advanced over an 018 Aristotle micro guidewire. The micro guidewire was gently advanced to engage the stump of the previously occluded inferior division. The micro guidewire met with significant resistance just distal to the stump. A second attempt was made using an 014 inch Aristotle microcatheter. The micro guidewire and the microcatheter combination could not be advanced beyond a few mm of the stump confirming prominent occlusion of the inferior division of the right middle cerebral artery. A final control arteriogram through the 088 Zoom  support catheter in the right common carotid artery revealed the right internal carotid artery to be widely patent in its entirety. The right anterior cerebral artery continued to demonstrate a TICI 3 revascularization. An 8 French Angio-Seal closure device was deployed at the right groin puncture site. Distal pulses remained present bilaterally unchanged from prior to the procedure. A flat panel CT of the brain revealed no evidence of intracranial hemorrhage. The patient was then extubated. Upon recovery, the patient appeared agitated though moved his right side spontaneously and attempted to move his left upper and left lower extremities. He remained unresponsive to simple commands. He was then transferred to the neuro ICU for post revascularization care. IMPRESSION: Status post endovascular revascularization of right anterior cerebral artery distal A2 segment with 1 pass with contact aspiration, and 1 pass with a 4 mm x 40 mm Solitaire X retrieval device and contact aspiration achieving a TICI 3 revascularization. 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 PLAN: As per stroke neurology. Electronically Signed   By: Thyra Nash M.D.   On: 12/25/2023 10:33   IR CT Head Ltd Result Date: 12/25/2023 INDICATION: Acute onset of flaccid left sided upper and lower extremities. Occluded right anterior cerebral artery distal A2 segment. EXAM: 1. EMERGENT LARGE VESSEL OCCLUSION THROMBOLYSIS (anterior CIRCULATION) COMPARISON:  CT angiogram of the head and neck of December 23, 2023. MEDICATIONS: No antibiotic was administered within 1 hour of the procedure. ANESTHESIA/SEDATION: General anesthesia. CONTRAST:  Omnipaque  300 approximately 90 cc. FLUOROSCOPY TIME:  Fluoroscopy Time: 24 minutes 54 seconds (1499 mGy). COMPLICATIONS: None immediate. TECHNIQUE: Following a full explanation of the procedure along with the potential associated complications, an informed witnessed  consent was obtained. The risks of intracranial hemorrhage of 10%, worsening neurological deficit, ventilator dependency, death and inability to revascularize were all reviewed in detail with the patient's son. The patient was then put under general anesthesia by the Department of Anesthesiology at [facility]. The right groin was prepped and draped in the usual sterile fashion. Thereafter using modified Seldinger technique, transfemoral access into the right common femoral artery was obtained without difficulty. Over an 0.035 inch guidewire an 8 French 25 cm [place] Pinnacle sheath was inserted. Through this, and also over an 0.035 inch guidewire a combination of a 125 cm 6 [place] Berenstein support catheter inside of an 088 100 cm Zoom catheter was advanced to the aortic arch region and selectively positioned in the right common carotid artery and then the distal right ICA cavernous segment. FINDINGS: The right common carotid arteriogram demonstrates the right external carotid artery and its major branches to be widely patent. The right internal carotid artery  at the bulb to the cranial skull base is patent. The petrous, the cavernous and the supraclinoid right ICA are widely patent. The left anterior cerebral artery demonstrates occlusion in the distal A2 segment. Right middle cerebral artery demonstrates a stump of a chronic occlusion of the inferior division of the right middle cerebral artery. PROCEDURE: Through the 088 Zoom catheter in the proximal cavernous segment, a combination of an 068 aspiration catheter with an 043 135 cm distal aspiration catheter was advanced over an 018 inch Aristotle micro guidewire with a moderate J configuration. The micro guidewire was then advanced through the occluded right anterior cerebral artery A2 segment followed by the 043 distal aspiration catheter. The guidewire was removed. Aspiration via a pump was then deployed for a couple of minutes through the 043 hub.  This combination was removed. Control arteriogram performed through the 088 Zoom support catheter demonstrated no change in the occluded right anterior cerebral artery distal A2 segment of the clot fragment was obtained. A second pass was then made using an 062 136 cm distal aspiration catheter advanced with an 021 160 cm microcatheter over an 018 inch Aristotle micro guidewire to the distal A1 segment. Micro guidewire was then gently advanced without difficulty to the distal A3 segment followed by the microcatheter. At this time, a 4 mm x 40 mm Solitaire X retrieval device was deployed in the usual manner after having verified safe positioning of the tip of the microcatheter. The 062 distal aspiration catheter was then advanced into the right anterior cerebral A2 segment into the proximal portion of the retrieval device. Constant aspiration was applied for 20 minutes at the hub of the 062 aspiration catheter followed by retrieval of the microcatheter the retrieval device, and the 062 aspiration catheter as constant aspiration was applied at the hub of the 088 Zoom support catheter. A control arteriogram performed through the Zoom 088 aspiration catheter demonstrated complete revascularization of the right anterior cerebral artery achieving a TICI 3 revascularization. Mild spasm of the proximal right A2 responded to 2 aliquots of 25 mcg of nitroglycerin  intra-arterially. The 062 aspiration catheter was then advanced into the proximal right middle cerebral M1 segment through which an 021 microcatheter was advanced over an 018 Aristotle micro guidewire. The micro guidewire was gently advanced to engage the stump of the previously occluded inferior division. The micro guidewire met with significant resistance just distal to the stump. A second attempt was made using an 014 inch Aristotle microcatheter. The micro guidewire and the microcatheter combination could not be advanced beyond a few mm of the stump confirming  prominent occlusion of the inferior division of the right middle cerebral artery. A final control arteriogram through the 088 Zoom support catheter in the right common carotid artery revealed the right internal carotid artery to be widely patent in its entirety. The right anterior cerebral artery continued to demonstrate a TICI 3 revascularization. An 8 French Angio-Seal closure device was deployed at the right groin puncture site. Distal pulses remained present bilaterally unchanged from prior to the procedure. A flat panel CT of the brain revealed no evidence of intracranial hemorrhage. The patient was then extubated. Upon recovery, the patient appeared agitated though moved his right side spontaneously and attempted to move his left upper and left lower extremities. He remained unresponsive to simple commands. He was then transferred to the neuro ICU for post revascularization care. IMPRESSION: Status post endovascular revascularization of right anterior cerebral artery distal A2 segment with 1 pass with contact aspiration,  and 1 pass with a 4 mm x 40 mm Solitaire X retrieval device and contact aspiration achieving a TICI 3 revascularization. 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 PLAN: As per stroke neurology. Electronically Signed   By: Thyra Nash M.D.   On: 12/25/2023 10:33   ECHOCARDIOGRAM COMPLETE Result Date: 12/24/2023    ECHOCARDIOGRAM REPORT   Patient Name:   Mark Herrera Date of Exam: 12/24/2023 Medical Rec #:  [id number]         Height:       68.0 in Accession #:    [id number]        Weight:       158.5 lb Date of Birth:  1960-10-31          BSA:          1.851 m Patient Age:    63 years          BP:           133/79 mmHg Patient Gender: M                 HR:           63 bpm. Exam Location:  Inpatient Procedure: 2D Echo, 3D Echo, Intracardiac Opacification Agent, Color Doppler,            Cardiac Doppler and Strain Analysis (Both  Spectral and Color Flow            Doppler were utilized during procedure). Indications:    Stroke I63.9  History:        Patient has prior history of Echocardiogram examinations, most                 recent 09/27/2023. Stroke and Polysubstance Abuse; Risk                 Factors:Dyslipidemia.  Sonographer:    Koleen Popper RDCS Referring Phys: [id number] ASHISH ARORA  Sonographer Comments: Global longitudinal strain was attempted. IMPRESSIONS  1. No clear evidence of LV thrombus. Left ventricular ejection fraction, by estimation, is 30 to 35%. The left ventricle has moderately decreased function. The left ventricle demonstrates global hypokinesis. The left ventricular internal cavity size was  mildly dilated. Left ventricular diastolic parameters are consistent with Grade I diastolic dysfunction (impaired relaxation). The average left ventricular global longitudinal strain is -15.3 %. The global longitudinal strain is abnormal.  2. Right ventricular systolic function is normal. The right ventricular size is normal. Tricuspid regurgitation signal is inadequate for assessing PA pressure.  3. Left atrial size was mild to moderately dilated.  4. The mitral valve is normal in structure. Mild mitral valve regurgitation. No evidence of mitral stenosis.  5. The aortic valve is normal in structure. Aortic valve regurgitation is not visualized. No aortic stenosis is present.  6. The inferior vena cava is dilated in size with <50% respiratory variability, suggesting right atrial pressure of 15 mmHg. FINDINGS  Left Ventricle: No clear evidence of LV thrombus. Left ventricular ejection fraction, by estimation, is 30 to 35%. The left ventricle has moderately decreased function. The left ventricle demonstrates global hypokinesis. Definity  contrast agent was given IV to delineate the left ventricular endocardial borders. The average left ventricular global longitudinal strain is -15.3 %. Strain was performed and the global  longitudinal strain is abnormal. 3D ejection fraction reviewed and evaluated as part of the interpretation. Alternate measurement of EF is felt to be most reflective of  LV function. The left ventricular internal cavity size was mildly dilated. There is no left ventricular hypertrophy. Left ventricular diastolic parameters are consistent with Grade I diastolic dysfunction (impaired relaxation). Right Ventricle: The right ventricular size is normal. No increase in right ventricular wall thickness. Right ventricular systolic function is normal. Tricuspid regurgitation signal is inadequate for assessing PA pressure. Left Atrium: Left atrial size was mild to moderately dilated. Right Atrium: Right atrial size was normal in size. Pericardium: There is no evidence of pericardial effusion. Mitral Valve: The mitral valve is normal in structure. Mild mitral valve regurgitation. No evidence of mitral valve stenosis. Tricuspid Valve: The tricuspid valve is normal in structure. Tricuspid valve regurgitation is not demonstrated. No evidence of tricuspid stenosis. Aortic Valve: The aortic valve is normal in structure. Aortic valve regurgitation is not visualized. No aortic stenosis is present. Pulmonic Valve: The pulmonic valve was normal in structure. Pulmonic valve regurgitation is not visualized. No evidence of pulmonic stenosis. Aorta: The aortic root is normal in size and structure. Venous: The inferior vena cava is dilated in size with less than 50% respiratory variability, suggesting right atrial pressure of 15 mmHg. IAS/Shunts: No atrial level shunt detected by color flow Doppler. Additional Comments: 3D was performed not requiring image post processing on an independent workstation and was abnormal.  LEFT VENTRICLE PLAX 2D LVIDd:         6.10 cm   Diastology LVIDs:         4.90 cm   LV e' medial:    8.38 cm/s LV PW:         0.90 cm   LV E/e' medial:  9.9 LV IVS:        1.00 cm   LV e' lateral:   10.20 cm/s LVOT diam:      2.20 cm   LV E/e' lateral: 8.2 LV SV:         82 LV SV Index:   45        2D Longitudinal Strain LVOT Area:     3.80 cm  2D Strain GLS Avg:     -15.3 %                           3D Volume EF:                          3D EF:        42 %                          LV EDV:       231 ml                          LV ESV:       134 ml                          LV SV:        97 ml RIGHT VENTRICLE             IVC RV Basal diam:  4.10 cm     IVC diam: 2.30 cm RV Mid diam:    3.60 cm RV S prime:     19.50 cm/s LEFT ATRIUM             Index  RIGHT ATRIUM           Index LA diam:        4.50 cm 2.43 cm/m   RA Area:     18.40 cm LA Vol (A2C):   55.2 ml 29.81 ml/m  RA Volume:   54.50 ml  29.44 ml/m LA Vol (A4C):   77.3 ml 41.75 ml/m LA Biplane Vol: 72.2 ml 39.00 ml/m  AORTIC VALVE LVOT Vmax:   116.00 cm/s LVOT Vmean:  71.300 cm/s LVOT VTI:    0.217 m  AORTA Ao Root diam: 2.90 cm Ao Asc diam:  3.70 cm MITRAL VALVE MV Area (PHT): 4.21 cm    SHUNTS MV Decel Time: 180 msec    Systemic VTI:  0.22 m MV E velocity: 83.30 cm/s  Systemic Diam: 2.20 cm MV A velocity: 84.40 cm/s MV E/A ratio:  0.99 Morene Brownie Electronically signed by Morene Brownie Signature Date/Time: 12/24/2023/4:59:07 PM    Final    MR ANGIO HEAD WO CONTRAST Result Date: 12/24/2023 EXAM: MR Angiography Head without intravenous Contrast. 12/24/2023 01:06:00 AM TECHNIQUE: Magnetic resonance angiography images of the head without intravenous contrast. Multiplanar 2D and 3D reformatted images are provided for review. COMPARISON: None provided. CLINICAL HISTORY: Stroke, follow up. Pt unable to complete exam due to pain. FINDINGS: ANTERIOR CIRCULATION: No significant stenosis of the internal carotid arteries. No significant stenosis of the anterior cerebral arteries. No significant stenosis of the middle cerebral arteries. No aneurysm. POSTERIOR CIRCULATION: No significant stenosis of the posterior cerebral arteries. No significant stenosis of the basilar  artery. No significant stenosis of the vertebral arteries. No aneurysm. IMPRESSION: 1. No significant stenosis of the intracranial vasculature. Electronically signed by: Franky Stanford MD 12/24/2023 01:44 AM EDT RP Workstation: [id number]   MR BRAIN WO CONTRAST Result Date: 12/24/2023 EXAM: MRI BRAIN WITHOUT CONTRAST 12/24/2023 01:05:44 AM TECHNIQUE: Multiplanar multisequence MRI of the head/brain was performed without the administration of intravenous contrast. COMPARISON: 12/26/2023 CLINICAL HISTORY: Stroke, follow up. FINDINGS: BRAIN AND VENTRICLES: There is a small focus of abnormal diffusion restriction within the anterior right frontal white matter. Old right MCA (middle cerebral artery) territory infarct with encephalomalacia, gliosis and chronic blood products. Multifocal hyperintense T2-weighted signal within the cerebral white matter, most commonly due to chronic small vessel disease. No midline shift. No hydrocephalus. The sella is unremarkable. Normal flow voids. ORBITS: No acute abnormality. SINUSES AND MASTOIDS: No acute abnormality. BONES AND SOFT TISSUES: Normal marrow signal. No acute soft tissue abnormality. IMPRESSION: 1. Small focus of acute ischemia within the anterior right frontal white matter. 2. Old right MCA territory infarct with encephalomalacia, gliosis, and chronic blood products. 3. Multifocal hyperintense T2-weighted signal within the cerebral white matter, most commonly due to chronic small vessel disease. Electronically signed by: Franky Stanford MD 12/24/2023 01:41 AM EDT RP Workstation: [id number]   CT C-SPINE NO CHARGE Result Date: 12/23/2023 EXAM: CT CERVICAL SPINE WITHOUT CONTRAST 12/23/2023 07:30:03 PM TECHNIQUE: CT of the cervical spine was performed without the administration of intravenous contrast. Multiplanar reformatted images are provided for review. Automated exposure control, iterative reconstruction, and/or weight based adjustment of the mA/kV was utilized to reduce  the radiation dose to as low as reasonably achievable. COMPARISON: None available. CLINICAL HISTORY: FINDINGS: CERVICAL SPINE: BONES AND ALIGNMENT: Reversal of normal cervical lordosis. Degenerative grade 1 retrolisthesis at C3-4 and C4-5. DEGENERATIVE CHANGES: Mild C3-4 spinal canal stenosis with severe right C4 foraminal stenosis. Severe right C6 foraminal stenosis due to osteophyte complex. SOFT TISSUES: No prevertebral soft tissue  swelling. IMPRESSION: 1. No acute fracture of the cervical spine. 2. Mild C3-4 spinal canal stenosis with severe right C4 foraminal stenosis. 3. Severe right C6 foraminal stenosis due to disc osteophyte complex. Electronically signed by: Franky Stanford MD 12/23/2023 08:00 PM EDT RP Workstation: [id number]   CT ANGIO HEAD NECK W WO CM W PERF (CODE STROKE) Result Date: 12/23/2023 EXAM: CTA Head and Neck with Perfusion 12/23/2023 07:30:03 PM TECHNIQUE: CTA of the head and neck was performed with the administration of intravenous contrast. 3D postprocessing with multiplanar reconstructions and MIPs was performed to evaluate the vascular anatomy. Automated exposure control, iterative reconstruction, and/or weight based adjustment of the mA/kV was utilized to reduce the radiation dose to as low as reasonably achievable. COMPARISON: 09/26/2023 CLINICAL HISTORY: Neuro deficit, acute, stroke suspected. Code stroke FINDINGS: CTA NECK: AORTIC ARCH AND ARCH VESSELS: Mild calcific aortic atherosclerosis. No dissection or arterial injury. No significant stenosis of the brachiocephalic or subclavian arteries. CERVICAL CAROTID ARTERIES: No dissection, arterial injury, or hemodynamically significant stenosis by NASCET criteria. There is mild atherosclerotic calcification of the internal carotid arteries at the skull base. CERVICAL VERTEBRAL ARTERIES: Moderate narrowing of the left vertebral artery origin due to calcific atherosclerosis. Vertebral arteries are otherwise normal. No dissection or arterial  injury. LUNGS AND MEDIASTINUM: Unremarkable. SOFT TISSUES: No acute abnormality. BONES: No acute abnormality. CTA HEAD: ANTERIOR CIRCULATION: No significant stenosis of the internal carotid arteries. No significant stenosis of the anterior cerebral arteries. Attenuated appearance of the right MCA M2 branches, which is unchanged. No aneurysm. POSTERIOR CIRCULATION: No significant stenosis of the posterior cerebral arteries. No significant stenosis of the basilar artery. No significant stenosis of the vertebral arteries. No aneurysm. OTHER: No dural venous sinus thrombosis on this non-dedicated study. CT PERFUSION: 100 ml area of ischemic penumbra calculated predominantly within the right frontal lobe. Areas of ischemia identified in the inferior frontal poles are likely artifactual. No core infarct identified. EXAM QUALITY: Exam quality is adequate with diagnostic perfusion maps. No significant motion artifact. Appropriate arterial inflow and venous outflow curves. CORE INFARCT (CBF<30% volume): 0 mL TOTAL HYPOPERFUSION (Tmax>6s volume): 100 mL PENUMBRA: Mismatch volume: 100 mL Mismatch ratio: Not applicable Location: Predominantly within the right frontal lobe IMPRESSION: 1. No acute large vessel occlusion. Unchanged appearance of chronic R MCA severe attenuation. 2. No core infarct identified. 3. 100 ml area of ischemic penumbra calculated predominantly within the right frontal lobe. This is overestimated based on inclusion of the inferior frontal poles, but the findings in the distal MCA territory are likely legitimate. This pattern might be explained by vasospasm. 4. Moderate narrowing of the left vertebral artery origin due to calcific atherosclerosis. Discussed with Dr. Voncile at 7:55pm. Electronically signed by: Franky Stanford MD 12/23/2023 07:56 PM EDT RP Workstation: [id number]   CT HEAD CODE STROKE WO CONTRAST Result Date: 12/23/2023 CLINICAL DATA:  Code stroke.  Neuro deficit, acute, stroke suspected EXAM:  CT HEAD WITHOUT CONTRAST TECHNIQUE: Contiguous axial images were obtained from the base of the skull through the vertex without intravenous contrast. RADIATION DOSE REDUCTION: This exam was performed according to the departmental dose-optimization program which includes automated exposure control, adjustment of the mA and/or kV according to patient size and/or use of iterative reconstruction technique. COMPARISON:  MRI of the head dated September 26, 2023. FINDINGS: Brain: Age-related cerebral volume loss. Encephalomalacia changes are again demonstrated within the right vital in temporal lobes and insular ribbon. There is no evidence of hemorrhage, mass or acute cortical infarct. Vascular: Negative. Skull: Intact  and unremarkable. Sinuses/Orbits: Clear paranasal sinuses.  Normal orbits. Other: None. IMPRESSION: 1. Chronic encephalomalacia changes within the right MCA distribution. No apparent acute process. 2. ASPECTS is 10. 3. These results were communicated to Dr. Arora at 7:19 pm on 12/23/2023 by text page via the [facility] messaging system. Electronically Signed   By: Evalene Coho M.D.   On: 12/23/2023 19:21       HISTORY OF PRESENT ILLNESS 63 y.o. patient with history of multiple prior strokes with residual right hemiparesis, cocaine abuse, tobacco abuse, alcoholism, CAD and hyperlipidemia was admitted with sudden onset left-sided weakness which she noticed after a fall.  HOSPITAL COURSE Patient was found to have large right MCA/ACA territory penumbra on CT perfusion and was taken to interventional radiology for revascularization of right ACA.  Procedure was successful.  Of note, prior strokes have been attributed to cocaine abuse, but UDS was negative this admission.  Stroke:  right frontal white matter small infarct with R A2 occlusion s/p IR with TICI3, etiology: Large vessel occlusion most likely in the setting of previous substance abuse Code Stroke CT head No acute abnormality.  Encephalomalacia  within right MCA distribution ASPECTS 10.    CTA head & neck no acute LVO, right M2 chronic occlusion, moderate narrowing of left vertebral artery CT perfusion 100 mm area of ischemic penumbra within right frontal lobe, possibly explained by vasospasm S/p IR with acute R A2 occlusion s/p TICI3, old R M2 occlusion MRI small focus of acute ischemia within anterior right frontal white matter, old right MCA territory infarct with encephalomalacia gliosis and chronic blood products, chronic small vessel disease 2D Echo EF 30 to 35% LDL 81 HgbA1c 5.4 UDS negative VTE prophylaxis -SCDs aspirin  81 mg daily prior to admission, now on aspirin  81 mg daily and clopidogrel  75 mg daily for 3 weeks and then Plavix  alone. Therapy recommendations:  Outpatient PT/OT/ST Disposition: pending, likely home   Hx of Stroke/TIA 05/2019: Admitted for right side weakness.  Received tPA.  MRI negative.  Echo showed EF of 40 to 45%. LDL 99, and A1C 5.4. UDS cocaine and THC positive.  Discharged on DAPT and Lipitor  40.  At this time patient endorsed noncompliance with his medications 05/2020: Admitted for right MCA infarct with right M1 occlusion status post IR with TICI2c. EF 25-30%, LDL 84, A1C 5.4 and UDS positive for cocaine and THC. Discharged on DAPT for 3 weeks and lipitor  80. Recommend 30 day cardiac monitoring as outpt  09/2021: Admitted for dysarthria, left facial droop, left-sided weakness, status post TNK administration.  Echo showed EF of 30 to 35%.  MRI shows acute right MCA branch infarct. CTA  head and  neck showed R M2 occlusion. LDL 105 and A1C 5.3. UDS positive for cocaine and THC. Discharged on DAPT for 3 months, then asa alone as well as lipitor  40. Patient endorses medication non-compliance. 09/2023 admitted for right-sided weakness, speech difficulty and lethargy.  CTA head and neck showed abrupt cut off left M1/M2, chronic proximal right M2 occlusion.  CTP 0/68 cc, status post IR with left MCA TICI2c  reperfusion.  MRI showed acute infarct involving left insular cortex as well as additional scattered acute cortical infarct involving left MCA territory.  EF 30 to 35%.  LDL 105, A1c 5.2, UDS positive for cocaine.  Discharged on DAPT for 3 weeks and then aspirin  alone.  Recommend 30-day CardioNet monitor as outpatient Patient has recently undergone 30-day heart monitor study, has not sent back the device for checking  CHF CAD and MI 2018 s/p stenting Acute anterior STEMI 2018 Total occlusion of proximal LAD, heavy thrombus S/P successful PTCA/DES x 1 to proximal LAD Placed on aspirin , Brilinta , Coreg , lisinopril , statin 09/2021 and 09/2023 EF 30-35% This admission 2D Echo EF 30-35%  Stable, continue outpt follow up with cardiology Now on DAPT   Hypertension Home meds: None Stable Long-term BP goal normotensive   Hyperlipidemia Home meds: Atorvastatin  40 mg daily, increased to 80 LDL 81, goal < 70 Continue statin at discharge   Tobacco Abuse Patient smokes 1 packs per day       Ready to quit? No   Substance Abuse Patient uses marijuana and has extensive cocaine abuse history UDS negative this admission, patient states he has stopped using cocaine but continues using marijuana and does not wish to quit   Other Stroke Risk Factors Alcohol use   Other Active Problems Medication noncompliance  DISCHARGE EXAM General:  Alert, well-nourished, well-developed patient in no acute distress Psych:  Mood and affect appropriate for situation CV: Regular rate and rhythm on monitor Respiratory:  Regular, unlabored respirations on room air     NEURO:  Mental Status: AA&Ox3, patient is able to give clear and coherent history Speech/Language: speech is without dysarthria or aphasia.  fluency, and comprehension intact.   Cranial Nerves:  II: PERRL.   III, IV, VI: EOMI. Eyelids elevate symmetrically.  V: Sensation is intact to light touch and symmetrical to face.  VII: Face is  symmetrical resting and smiling VIII: hearing intact to voice. IX, X: Phonation is normal.  XII: tongue is midline without fasciculations. Motor: Able to move all 4 extremities with good antigravity strength but slight weakness of the left arm and leg Tone: is normal and bulk is normal Sensation- Intact to light touch bilaterally. Extinction absent to light touch to DSS.   Coordination: FTN intact bilaterally Gait- deferred   Most Recent NIH 0  1a Level of Conscious.: 0 1b LOC Questions: 0 1c LOC Commands: 0 2 Best Gaze: 0 3 Visual: 0 4 Facial Palsy: 0 5a Motor Arm - left: 0 5b Motor Arm - Right: 0 6a Motor Leg - Left: 0 6b Motor Leg - Right: 0 7 Limb Ataxia: 0 8 Sensory: 0 9 Best Language: 0 10 Dysarthria: 0 11 Extinct. and Inatten.: 0 TOTAL: 0   Discharge Diet       Diet   Diet regular Room service appropriate? Yes; Fluid consistency: Thin   liquids  DISCHARGE PLAN Disposition: Home aspirin  81 mg daily and clopidogrel  75 mg daily for secondary stroke prevention for 3 weeks then clopidogrel  75 mg daily alone. Ongoing stroke risk factor control by Primary Care Physician at time of discharge Follow-up PCP Medicine, [facility] in 2 weeks. Follow-up in [facility] in 8 weeks, office to schedule an appointment. Able to see NP in clinic.  35 minutes were spent preparing discharge.  Patient seen and examined by NP/APP with MD. MD to update note as needed.   Jorene Last, DNP, FNP-BC [facility] Pager: [phone]  ATTENDING NOTE: I reviewed above note and agree with the assessment and plan. Pt was seen and examined.   No acute event overnight. Left sided weakness continues to improve. PT and OT recommend outpt. On DAPT and statin. Aggressive risk factor modification. Smoking cessation education provided. Pt is medically ready for discharge. Follow up at [facility]  For detailed assessment and plan, please refer  to above as I  have made changes wherever appropriate.   Ary Cummins, MD PhD Stroke Neurology 12/25/2023 10:52 PM

## 2023-12-24 NOTE — Plan of Care (Signed)
 Pt alert and oriented.  Now moving left side.  Speech clear.  Passed nursing bedside swallow.  Voiding adequately.  VSS.  Awaiting MRI.

## 2023-12-24 NOTE — Evaluation (Signed)
 Occupational Therapy Evaluation Patient Details Name: Mark Herrera MRN: [id number] DOB: 1960-12-09 Today's Date: 12/24/2023   History of Present Illness   Pt is a 63 y.o. M presenting to [facility] on 12/23/23 following a fall w/ sudden onset of L sided weakness. Pt admitted w/ R MCA stroke and is s/p R common carotid arteriogram. PMH is significant for multiple prior strokes w/ residual R hemiparesis and L sided deficits, CAD, and HLD.     Clinical Impressions Pt ind at baseline with ADLs/functional mobility, lives with family and was working PTA. Pt with LUE deficits, but able to use functionally for ADL tasks. Pt needing up to min A for ADLs, CGA for bed mobility and CGA for transfers with RW. Pt needs min cues throughout for safety awareness, decr overall awareness of current deficits. Pt presenting with impairments listed below, will follow acutely. Recommend OP OT at d/c.      If plan is discharge home, recommend the following:   A little help with walking and/or transfers;A little help with bathing/dressing/bathroom;Assistance with cooking/housework;Assist for transportation;Help with stairs or ramp for entrance;Direct supervision/assist for medications management;Direct supervision/assist for financial management     Functional Status Assessment   Patient has had a recent decline in their functional status and demonstrates the ability to make significant improvements in function in a reasonable and predictable amount of time.     Equipment Recommendations   Tub/shower seat     Recommendations for Other Services   PT consult     Precautions/Restrictions   Precautions Precautions: Fall Recall of Precautions/Restrictions: Intact Restrictions Weight Bearing Restrictions Per Provider Order: No     Mobility Bed Mobility Overal bed mobility: Needs Assistance Bed Mobility: Sit to Supine       Sit to supine: Contact guard assist        Transfers Overall  transfer level: Needs assistance Equipment used: Rolling walker (2 wheels) Transfers: Sit to/from Stand Sit to Stand: Contact guard assist, Min assist                  Balance Overall balance assessment: Needs assistance Sitting-balance support: No upper extremity supported, Feet supported Sitting balance-Leahy Scale: Good     Standing balance support: No upper extremity supported, During functional activity Standing balance-Leahy Scale: Fair                             ADL either performed or assessed with clinical judgement   ADL Overall ADL's : Needs assistance/impaired Eating/Feeding: Set up;Sitting   Grooming: Wash/dry face;Standing;Set up   Upper Body Bathing: Contact guard assist;Standing;Sitting   Lower Body Bathing: Contact guard assist;Sit to/from stand;Sitting/lateral leans   Upper Body Dressing : Contact guard assist;Standing;Sitting   Lower Body Dressing: Minimal assistance;Sitting/lateral leans;Sit to/from stand   Toilet Transfer: Ambulation;Rolling walker (2 wheels);Regular Toilet;Minimal assistance   Toileting- Clothing Manipulation and Hygiene: Contact guard assist       Functional mobility during ADLs: Contact guard assist;Minimal assistance;Rolling walker (2 wheels)       Vision   Additional Comments: not formally assessed this session     Perception         Praxis         Pertinent Vitals/Pain Pain Assessment Pain Assessment: No/denies pain     Extremity/Trunk Assessment Upper Extremity Assessment Upper Extremity Assessment: Right hand dominant;LUE deficits/detail LUE Deficits / Details: +drift, shoulder flexion to ~100*, 3+/5 globally LUE Sensation: decreased light touch;decreased proprioception  LUE Coordination: decreased fine motor;decreased gross motor   Lower Extremity Assessment Lower Extremity Assessment: Defer to PT evaluation   Cervical / Trunk Assessment Cervical / Trunk Assessment: Kyphotic    Communication Communication Communication: No apparent difficulties   Cognition Arousal: Alert Behavior During Therapy: WFL for tasks assessed/performed Cognition: No family/caregiver present to determine baseline             OT - Cognition Comments: tangential in conversation at times, also with impaired safety awareness/awareness of current deficits                 Following commands: Intact       Cueing  General Comments   Cueing Techniques: Verbal cues;Visual cues;Gestural cues  VSS on RA   Exercises     Shoulder Instructions      Home Living Family/patient expects to be discharged to:: Private residence Living Arrangements: Children Available Help at Discharge: Family;Available 24 hours/day Type of Home: House Home Access: Stairs to enter [employer] of Steps: 4 Entrance Stairs-Rails: Right;Left Home Layout: One level     Bathroom Shower/Tub: Chief Strategy Officer: Standard     Home Equipment: Grab bars - tub/shower      Lives With: Family    Prior Functioning/Environment Prior Level of Function : Independent/Modified Independent;Driving;Working/employed             Mobility Comments: independent ADLs Comments: independent. was working as a Environmental manager Problem List: Decreased strength;Decreased range of motion;Decreased activity tolerance;Impaired balance (sitting and/or standing);Decreased safety awareness;Impaired UE functional use;Impaired sensation   OT Treatment/Interventions: Self-care/ADL training;Energy conservation;Therapeutic exercise;DME and/or AE instruction;Neuromuscular education;Therapeutic activities;Cognitive remediation/compensation;Visual/perceptual remediation/compensation;Patient/family education;Balance training      OT Goals(Current goals can be found in the care plan section)   Acute Rehab OT Goals Patient Stated Goal: to go home OT Goal Formulation: With patient Time For Goal  Achievement: 01/07/24 Potential to Achieve Goals: Good ADL Goals Pt Will Perform Upper Body Dressing: with modified independence;sitting Pt Will Perform Lower Body Dressing: with modified independence;sitting/lateral leans;sit to/from stand Pt Will Transfer to Toilet: with modified independence;ambulating;regular height toilet Pt Will Perform Tub/Shower Transfer: Tub transfer;Shower transfer;with modified independence;ambulating Pt/caregiver will Perform Home Exercise Program: Increased ROM;Increased strength;Left upper extremity Additional ADL Goal #2: pt will recieve passing score on pillbox assessment in prep for IADLs   OT Frequency:  Min 2X/week    Co-evaluation              AM-PAC OT 6 Clicks Daily Activity     Outcome Measure Help from another person eating meals?: A Little Help from another person taking care of personal grooming?: A Little Help from another person toileting, which includes using toliet, bedpan, or urinal?: A Little Help from another person bathing (including washing, rinsing, drying)?: A Little Help from another person to put on and taking off regular upper body clothing?: A Little Help from another person to put on and taking off regular lower body clothing?: A Little 6 Click Score: 18   End of Session Equipment Utilized During Treatment: Rolling walker (2 wheels) Nurse Communication: Mobility status  Activity Tolerance: Patient tolerated treatment well Patient left: in bed;with call bell/phone within reach;with bed alarm set (in bed for ECHO)  OT Visit Diagnosis: Unsteadiness on feet (R26.81);Other abnormalities of gait and mobility (R26.89);History of falling (Z91.81);Muscle weakness (generalized) (M62.81)                Time: 8862-8787 OT Time Calculation (min):  35 min Charges:  OT General Charges $OT Visit: 1 Visit OT Evaluation $OT Eval Low Complexity: 1 Low OT Treatments $Self Care/Home Management : 8-22 mins  Monea Pesantez K, OTD,  OTR/L SecureChat Preferred Acute Rehab [phone]   Laneta POUR Koonce 12/24/2023, 2:52 PM

## 2023-12-24 NOTE — Progress Notes (Signed)
 Referring Physician(s): Dr. Eligio Lav  Supervising Physician: Dolphus Carrion  Patient Status:  [facility] - In-pt  Chief Complaint: CODE STROKE L sided weakness  Subjective: Patient with residual right-sided weakness, left-sided deficits from prior stroke.  Admitted overnight with complete left hemiparesis which overall appears to be nearing baseline.   Allergies: Patient has no known allergies.  Medications: Prior to Admission medications   Medication Sig Start Date End Date Taking? Authorizing Provider  aspirin  EC 81 MG tablet Take 1 tablet (81 mg total) by mouth daily. Swallow whole. 09/28/23   Judithe Rocky BROCKS, NP  atorvastatin  (LIPITOR ) 40 MG tablet Take 1 tablet (40 mg total) by mouth daily. 10/28/23   Gayland Lauraine PARAS, NP  clopidogrel  (PLAVIX ) 75 MG tablet Take 1 tablet (75 mg total) by mouth daily. 09/28/23   Lehner, Erin C, NP  nicotine  (NICODERM CQ  - DOSED IN MG/24 HOURS) 14 mg/24hr patch Place 1 patch (14 mg total) onto the skin daily. 09/28/23   Judithe Rocky BROCKS, NP     Vital Signs: BP 133/79   Pulse 71   Temp 98 F (36.7 C) (Oral)   Resp (!) 23   Ht 5' 8 (1.727 m)   Wt 158 lb 8.2 oz (71.9 kg)   SpO2 100%   BMI 24.10 kg/m   Physical Exam NAD, alert Neuro: EOMs intact. No facial droop.  Speech intelligible. Moving upper and lower extremities spontaneously.  Follows commands-- sometimes needs repetition which appears to be focus-related, not interpretative.  RUE/RLE 4+/5, LUE 4/5, LLE 4-/5.   Skin: R groin procedure site soft, intact.  No evidence of hematoma or pseudoaneurysm.  DP 2+ bilaterally.    Imaging: MR ANGIO HEAD WO CONTRAST Result Date: 12/24/2023 EXAM: MR Angiography Head without intravenous Contrast. 12/24/2023 01:06:00 AM TECHNIQUE: Magnetic resonance angiography images of the head without intravenous contrast. Multiplanar 2D and 3D reformatted images are provided for review. COMPARISON: None provided. CLINICAL HISTORY: Stroke, follow up. Pt unable  to complete exam due to pain. FINDINGS: ANTERIOR CIRCULATION: No significant stenosis of the internal carotid arteries. No significant stenosis of the anterior cerebral arteries. No significant stenosis of the middle cerebral arteries. No aneurysm. POSTERIOR CIRCULATION: No significant stenosis of the posterior cerebral arteries. No significant stenosis of the basilar artery. No significant stenosis of the vertebral arteries. No aneurysm. IMPRESSION: 1. No significant stenosis of the intracranial vasculature. Electronically signed by: Franky Stanford MD 12/24/2023 01:44 AM EDT RP Workstation: [id number]   MR BRAIN WO CONTRAST Result Date: 12/24/2023 EXAM: MRI BRAIN WITHOUT CONTRAST 12/24/2023 01:05:44 AM TECHNIQUE: Multiplanar multisequence MRI of the head/brain was performed without the administration of intravenous contrast. COMPARISON: 12/26/2023 CLINICAL HISTORY: Stroke, follow up. FINDINGS: BRAIN AND VENTRICLES: There is a small focus of abnormal diffusion restriction within the anterior right frontal white matter. Old right MCA (middle cerebral artery) territory infarct with encephalomalacia, gliosis and chronic blood products. Multifocal hyperintense T2-weighted signal within the cerebral white matter, most commonly due to chronic small vessel disease. No midline shift. No hydrocephalus. The sella is unremarkable. Normal flow voids. ORBITS: No acute abnormality. SINUSES AND MASTOIDS: No acute abnormality. BONES AND SOFT TISSUES: Normal marrow signal. No acute soft tissue abnormality. IMPRESSION: 1. Small focus of acute ischemia within the anterior right frontal white matter. 2. Old right MCA territory infarct with encephalomalacia, gliosis, and chronic blood products. 3. Multifocal hyperintense T2-weighted signal within the cerebral white matter, most commonly due to chronic small vessel disease. Electronically signed by: Franky Stanford MD 12/24/2023  01:41 AM EDT RP Workstation: [id number]   CT C-SPINE NO  CHARGE Result Date: 12/23/2023 EXAM: CT CERVICAL SPINE WITHOUT CONTRAST 12/23/2023 07:30:03 PM TECHNIQUE: CT of the cervical spine was performed without the administration of intravenous contrast. Multiplanar reformatted images are provided for review. Automated exposure control, iterative reconstruction, and/or weight based adjustment of the mA/kV was utilized to reduce the radiation dose to as low as reasonably achievable. COMPARISON: None available. CLINICAL HISTORY: FINDINGS: CERVICAL SPINE: BONES AND ALIGNMENT: Reversal of normal cervical lordosis. Degenerative grade 1 retrolisthesis at C3-4 and C4-5. DEGENERATIVE CHANGES: Mild C3-4 spinal canal stenosis with severe right C4 foraminal stenosis. Severe right C6 foraminal stenosis due to osteophyte complex. SOFT TISSUES: No prevertebral soft tissue swelling. IMPRESSION: 1. No acute fracture of the cervical spine. 2. Mild C3-4 spinal canal stenosis with severe right C4 foraminal stenosis. 3. Severe right C6 foraminal stenosis due to disc osteophyte complex. Electronically signed by: Franky Stanford MD 12/23/2023 08:00 PM EDT RP Workstation: [id number]   CT ANGIO HEAD NECK W WO CM W PERF (CODE STROKE) Result Date: 12/23/2023 EXAM: CTA Head and Neck with Perfusion 12/23/2023 07:30:03 PM TECHNIQUE: CTA of the head and neck was performed with the administration of intravenous contrast. 3D postprocessing with multiplanar reconstructions and MIPs was performed to evaluate the vascular anatomy. Automated exposure control, iterative reconstruction, and/or weight based adjustment of the mA/kV was utilized to reduce the radiation dose to as low as reasonably achievable. COMPARISON: 09/26/2023 CLINICAL HISTORY: Neuro deficit, acute, stroke suspected. Code stroke FINDINGS: CTA NECK: AORTIC ARCH AND ARCH VESSELS: Mild calcific aortic atherosclerosis. No dissection or arterial injury. No significant stenosis of the brachiocephalic or subclavian arteries. CERVICAL CAROTID  ARTERIES: No dissection, arterial injury, or hemodynamically significant stenosis by NASCET criteria. There is mild atherosclerotic calcification of the internal carotid arteries at the skull base. CERVICAL VERTEBRAL ARTERIES: Moderate narrowing of the left vertebral artery origin due to calcific atherosclerosis. Vertebral arteries are otherwise normal. No dissection or arterial injury. LUNGS AND MEDIASTINUM: Unremarkable. SOFT TISSUES: No acute abnormality. BONES: No acute abnormality. CTA HEAD: ANTERIOR CIRCULATION: No significant stenosis of the internal carotid arteries. No significant stenosis of the anterior cerebral arteries. Attenuated appearance of the right MCA M2 branches, which is unchanged. No aneurysm. POSTERIOR CIRCULATION: No significant stenosis of the posterior cerebral arteries. No significant stenosis of the basilar artery. No significant stenosis of the vertebral arteries. No aneurysm. OTHER: No dural venous sinus thrombosis on this non-dedicated study. CT PERFUSION: 100 ml area of ischemic penumbra calculated predominantly within the right frontal lobe. Areas of ischemia identified in the inferior frontal poles are likely artifactual. No core infarct identified. EXAM QUALITY: Exam quality is adequate with diagnostic perfusion maps. No significant motion artifact. Appropriate arterial inflow and venous outflow curves. CORE INFARCT (CBF<30% volume): 0 mL TOTAL HYPOPERFUSION (Tmax>6s volume): 100 mL PENUMBRA: Mismatch volume: 100 mL Mismatch ratio: Not applicable Location: Predominantly within the right frontal lobe IMPRESSION: 1. No acute large vessel occlusion. Unchanged appearance of chronic R MCA severe attenuation. 2. No core infarct identified. 3. 100 ml area of ischemic penumbra calculated predominantly within the right frontal lobe. This is overestimated based on inclusion of the inferior frontal poles, but the findings in the distal MCA territory are likely legitimate. This pattern might  be explained by vasospasm. 4. Moderate narrowing of the left vertebral artery origin due to calcific atherosclerosis. Discussed with Dr. Voncile at 7:55pm. Electronically signed by: Franky Stanford MD 12/23/2023 07:56 PM EDT RP Workstation: [id number]  CT HEAD CODE STROKE WO CONTRAST Result Date: 12/23/2023 CLINICAL DATA:  Code stroke.  Neuro deficit, acute, stroke suspected EXAM: CT HEAD WITHOUT CONTRAST TECHNIQUE: Contiguous axial images were obtained from the base of the skull through the vertex without intravenous contrast. RADIATION DOSE REDUCTION: This exam was performed according to the departmental dose-optimization program which includes automated exposure control, adjustment of the mA and/or kV according to patient size and/or use of iterative reconstruction technique. COMPARISON:  MRI of the head dated September 26, 2023. FINDINGS: Brain: Age-related cerebral volume loss. Encephalomalacia changes are again demonstrated within the right vital in temporal lobes and insular ribbon. There is no evidence of hemorrhage, mass or acute cortical infarct. Vascular: Negative. Skull: Intact and unremarkable. Sinuses/Orbits: Clear paranasal sinuses.  Normal orbits. Other: None. IMPRESSION: 1. Chronic encephalomalacia changes within the right MCA distribution. No apparent acute process. 2. ASPECTS is 10. 3. These results were communicated to Dr. Arora at 7:19 pm on 12/23/2023 by text page via the [facility] messaging system. Electronically Signed   By: Evalene Coho M.D.   On: 12/23/2023 19:21    Labs:  CBC: Recent Labs    09/27/23 0359 09/28/23 0727 12/23/23 1908 12/23/23 1913 12/24/23 0613  WBC 7.0 4.1 4.1  --  3.5*  HGB 12.2* 13.2 13.3 14.6 12.1*  HCT 37.8* 41.6 40.8 43.0 36.5*  PLT 239 233 188  --  184    COAGS: Recent Labs    09/26/23 0722 12/23/23 1908  INR 1.0 1.0  APTT 27 29    BMP: Recent Labs    09/27/23 0359 09/28/23 0727 12/23/23 1908 12/23/23 1913 12/24/23 0613  NA 137 140 138  139 137  K 4.0 4.5 3.7 3.7 4.1  CL 102 105 104 105 106  CO2 26 27 23   --  22  GLUCOSE 135* 97 113* 108* 178*  BUN 15 13 14 16 10   CALCIUM  8.5* 8.8* 8.7*  --  8.6*  CREATININE 1.18 0.95 0.98 1.00 0.93  GFRNONAA >60 >60 >60  --  >60    LIVER FUNCTION TESTS: Recent Labs    09/26/23 0722 09/27/23 0359 12/23/23 1908  BILITOT 0.6 0.5 0.8  AST 22 21 18   ALT 17 16 14   ALKPHOS 56 58 70  PROT 5.9* 5.4* 6.4*  ALBUMIN 3.0* 2.6* 3.3*    Assessment and Plan: CODE STROKE R ACA occlusion s/p complete revascularization with thrombectomy (TICI 3) Patient stable this AM.  On bedrest but with spontaneous and purposeful movement. Appears to be nearing baseline- plans to work with PT/OT today.  Procedure site intact.  Further plans per Neuro.   Electronically Signed: Keil Pickering Sue-Ellen Mavin Dyke, PA 12/24/2023, 9:16 AM   I spent a total of 15 Minutes at the the patient's bedside AND on the patient's hospital floor or unit, greater than 50% of which was counseling/coordinating care for R ACA occlusion.

## 2023-12-24 NOTE — Discharge Instructions (Signed)
 Dear [place] JINNY Fuse,   Congratulations for your interest in quitting smoking!  Find a program that suits you best: when you want to quit, how you need support, where you live, and how you like to learn.    If you're ready to get started TODAY, consider scheduling a visit through [email or web address].  Appointments are available from 8am to 8pm, Monday to Friday.   Most health insurance plans will cover some level of tobacco cessation visits and medications.    Additional Resources: [employer] are also available to help you quit & provide the support you'll need. Many programs are available in both [place] and Spanish and have a long history of successfully helping people get off and stay off tobacco.    Quit Smoking Apps:  quitSTART at [employer]iousBroker.de QuitGuide?at ForgetParking.dk Online education and resources: Smokefree  at [employer].gov Free Telephone Coaching: QuitNow,  Call 1-800-QUIT-NOW ((760)350-9319) or Text- Ready to 2607232181 *Quitline [place] has teamed up with Medicaid to offer a free 14 week program    Vaping- Want to Quit? Free 24/7 support. Call [facility]  [place], [place], [place], [place], [place]  [facility]

## 2023-12-24 NOTE — Evaluation (Signed)
 Speech Language Pathology Evaluation Patient Details Name: Mark Herrera MRN: [id number] DOB: 1960/12/06 Today's Date: 12/24/2023 Time: 9141-9073 SLP Time Calculation (min) (ACUTE ONLY): 28 min  Problem List:  Patient Active Problem List   Diagnosis Date Noted   Acute ischemic right MCA stroke (HCC) 12/23/2023   Acute ischemic right anterior cerebral artery (ACA) stroke (HCC) 12/23/2023   Acute ischemic stroke (HCC) 09/26/2023   Right middle cerebral artery stroke (HCC)    Bradycardia    Dyslipidemia    Polysubstance abuse (HCC)    CVA (cerebral vascular accident) (HCC) 09/09/2021   Status post stroke 05/31/2020   Stroke (cerebrum) (HCC) 05/31/2020   Essential hypertension 06/01/2019   Cocaine abuse (HCC) 06/01/2019   Stroke-like episode s/p tPA 05/31/2019   Hyperlipemia 10/11/2016   Tobacco abuse 10/11/2016   STEMI (ST elevation myocardial infarction) (HCC) 10/09/2016   Acute ST elevation myocardial infarction (STEMI) involving left anterior descending (LAD) coronary artery [facility])    Past Medical History:  Past Medical History:  Diagnosis Date   CAD (coronary artery disease)    10/08/16 STEMI DES x1 to pLAD EF 45%   Cocaine abuse (HCC)    CVA (cerebral vascular accident) (HCC)    Gout    Tobacco abuse    Past Surgical History:  Past Surgical History:  Procedure Laterality Date   CORONARY STENT INTERVENTION N/A 10/08/2016   Procedure: Coronary Stent Intervention;  Surgeon: Verlin Lonni BIRCH, MD;  Location: [facility] INVASIVE CV LAB;  Service: Cardiovascular;  Laterality: N/A;   IR CT HEAD LTD  05/31/2020   IR CT HEAD LTD  09/26/2023   IR PERCUTANEOUS ART THROMBECTOMY/INFUSION INTRACRANIAL INC DIAG ANGIO  05/31/2020   IR PERCUTANEOUS ART THROMBECTOMY/INFUSION INTRACRANIAL INC DIAG ANGIO  09/26/2023   IR US  GUIDE VASC ACCESS RIGHT  05/31/2020   IR US  GUIDE VASC ACCESS RIGHT  09/26/2023   LEFT HEART CATH AND CORONARY ANGIOGRAPHY N/A 10/08/2016   Procedure: Left Heart Cath and  Coronary Angiography;  Surgeon: Verlin Lonni BIRCH, MD;  Location: [facility] INVASIVE CV LAB;  Service: Cardiovascular;  Laterality: N/A;   RADIOLOGY WITH ANESTHESIA N/A 05/31/2020   Procedure: IR WITH ANESTHESIA;  Surgeon: Radiologist, Medication, MD;  Location: [facility] OR;  Service: Radiology;  Laterality: N/A;   RADIOLOGY WITH ANESTHESIA N/A 09/05/2021   Procedure: RADIOLOGY WITH ANESTHESIA;  Surgeon: Radiologist, Medication, MD;  Location: [facility] OR;  Service: Radiology;  Laterality: N/A;   RADIOLOGY WITH ANESTHESIA N/A 09/26/2023   Procedure: RADIOLOGY WITH ANESTHESIA;  Surgeon: Radiologist, Medication, MD;  Location: [facility] OR;  Service: Radiology;  Laterality: N/A;   RADIOLOGY WITH ANESTHESIA N/A 12/23/2023   Procedure: RADIOLOGY WITH ANESTHESIA;  Surgeon: Dolphus Carrion, MD;  Location: [facility] OR;  Service: Radiology;  Laterality: N/A;   TOE SURGERY     HPI:  Mark Herrera is a 63 y.o. male with hx of multiple prior strokes with residual right hemiparesis and left-sided deficits from a prior stroke as well-secondary to persistent abuse and small vessel disease, tobacco and cocaine abuse, EtOH abuse, coronary artery disease, hyperlipidemia, presented for evaluation of sudden onset of left-sided weakness that was noticed after a fall. MRI report states: Small focus of acute ischemia within the anterior right frontal white  Matter. 2. Old right MCA territory infarct with encephalomalacia, gliosis, and chronic  blood products. 3. Multifocal hyperintense T2-weighted signal within the cerebral white matter, most commonly due to chronic small vessel disease. Patient with hx of CIR ST in 2023 targeting dysarthria.   Assessment /  Plan / Recommendation Clinical Impression  Patient was evaluated via the Cognistat to assess cognitive linguistic functioning. Patient scored WFL on all subtests with the exception of mild deficits in attention and similarities, though this is likely baseline. Patient with functional  memory, awareness and expressive/receptive language. Patient is 100% intelligible at the conversational level. Patient does not require further ST services at this time at it is likely at baseline level of functioning.     SLP Assessment  SLP Recommendation/Assessment: Patient does not need any further Speech Language Pathology Services SLP Visit Diagnosis: Cognitive communication deficit (R41.841)     Assistance Recommended at Discharge  Set up Supervision/Assistance  Functional Status Assessment Patient has not had a recent decline in their functional status     SLP Evaluation Cognition  Overall Cognitive Status: Within Functional Limits for tasks assessed Arousal/Alertness: Awake/alert Orientation Level: Oriented X4 Year: 2025 Month: July Memory: Appears intact Awareness: Appears intact Problem Solving: Appears intact       Comprehension  Auditory Comprehension Overall Auditory Comprehension: Appears within functional limits for tasks assessed    Expression Expression Primary Mode of Expression: Verbal Verbal Expression Overall Verbal Expression: Appears within functional limits for tasks assessed   Oral / Motor  Oral Motor/Sensory Function Overall Oral Motor/Sensory Function: Within functional limits Motor Speech Overall Motor Speech: Appears within functional limits for tasks assessed            Erica Richwine M.A., CCC-SLP 12/24/2023, 9:27 AM

## 2023-12-24 NOTE — TOC CAGE-AID Note (Signed)
 Transition of Care [facility]) - CAGE-AID Screening   Patient Details  Name: Mark Herrera MRN: [id number] Date of Birth: 06/02/1961  [facility] of [facility]) CM/SW Contact:    Inocente GORMAN Kindle, LCSW Phone Number: 12/24/2023, 11:56 AM   Clinical Narrative: Patient declined substance use.    CAGE-AID Screening:    Have You Ever Felt You Ought to Cut Down on Your Drinking or Drug Use?: No Have People Annoyed You By Critizing Your Drinking Or Drug Use?: No Have You Felt Bad Or Guilty About Your Drinking Or Drug Use?: No Have You Ever Had a Drink or Used Drugs First Thing In The Morning to Steady Your Nerves or to Get Rid of a Hangover?: No CAGE-AID Score: 0  Substance Abuse Education Offered: Yes

## 2023-12-24 NOTE — TOC Initial Note (Signed)
 Transition of Care [facility]) - Initial/Assessment Note    Patient Details  Name: Mark Herrera MRN: [id number] Date of Birth: 06/05/1960  Transition of Care [facility]) CM/SW Contact:    Inocente GORMAN Kindle, LCSW Phone Number: 12/24/2023, 11:52 AM  Clinical Narrative:                 Patient reported he resides at home with his son and daughter. He requested assistance with a Disability application as he started the process at the hospital a few years ago but never heard back. He has a part time job since he has no other income but does not feel he can work anymore after this hospitalization. He confirmed he has transportation and a PCP. CSW will reach out to [employer] to see if they can assist.   Expected Discharge Plan: OP Rehab Barriers to Discharge: Continued Medical Work up   Patient Goals and CMS Choice Patient states their goals for this hospitalization and ongoing recovery are:: Obtain Disability assistance          Expected Discharge Plan and Services In-house Referral: Financial Counselor     Living arrangements for the past 2 months: Single Family Home                                      Prior Living Arrangements/Services Living arrangements for the past 2 months: Single Family Home Lives with:: Adult Children Patient language and need for interpreter reviewed:: Yes Do you feel safe going back to the place where you live?: Yes      Need for Family Participation in Patient Care: No (Comment) Care giver support system in place?: Yes (comment)   Criminal Activity/Legal Involvement Pertinent to Current Situation/Hospitalization: No - Comment as needed  Activities of Daily Living   ADL Screening (condition at time of admission) Independently performs ADLs?: No Does the patient have a NEW difficulty with bathing/dressing/toileting/self-feeding that is expected to last >3 days?: Yes (Initiates electronic notice to provider for possible OT consult) Does the  patient have a NEW difficulty with getting in/out of bed, walking, or climbing stairs that is expected to last >3 days?: Yes (Initiates electronic notice to provider for possible PT consult) Does the patient have a NEW difficulty with communication that is expected to last >3 days?: Yes (Initiates electronic notice to provider for possible SLP consult) Is the patient deaf or have difficulty hearing?: No Does the patient have difficulty seeing, even when wearing glasses/contacts?: No Does the patient have difficulty concentrating, remembering, or making decisions?: No  Permission Sought/Granted                  Emotional Assessment Appearance:: Appears stated age Attitude/Demeanor/Rapport: Engaged Affect (typically observed): Accepting Orientation: : Oriented to Self, Oriented to Place, Oriented to  Time, Oriented to Situation Alcohol / Substance Use: Not Applicable Psych Involvement: No (comment)  Admission diagnosis:  Acute ischemic right MCA stroke (HCC) [I63.511] Acute ischemic right anterior cerebral artery (ACA) stroke (HCC) [I63.521] Patient Active Problem List   Diagnosis Date Noted   Acute ischemic right MCA stroke (HCC) 12/23/2023   Acute ischemic right anterior cerebral artery (ACA) stroke (HCC) 12/23/2023   Acute ischemic stroke (HCC) 09/26/2023   Right middle cerebral artery stroke (HCC)    Bradycardia    Dyslipidemia    Polysubstance abuse (HCC)    CVA (cerebral vascular accident) (HCC) 09/09/2021  Status post stroke 05/31/2020   Stroke (cerebrum) (HCC) 05/31/2020   Essential hypertension 06/01/2019   Cocaine abuse (HCC) 06/01/2019   Stroke-like episode s/p tPA 05/31/2019   Hyperlipemia 10/11/2016   Tobacco abuse 10/11/2016   STEMI (ST elevation myocardial infarction) (HCC) 10/09/2016   Acute ST elevation myocardial infarction (STEMI) involving left anterior descending (LAD) coronary artery [facility])    PCP:  Medicine, [facility]:    [facility] [address] [place] [address] Phone: [phone] Fax: [phone]  [id number] [place], [place] - [address] AT [facility] [address] [place] [address] Phone: [phone] Fax: [phone]  [id number] [place], [place] - [address] AT [facility] [address] [place] [address] Phone: [phone] Fax: [phone]     Social Drivers of Health (SDOH) Social History: SDOH Screenings   Food Insecurity: No Food Insecurity (12/23/2023)  Housing: Low Risk  (12/23/2023)  Transportation Needs: No Transportation Needs (12/23/2023)  Utilities: Not At Risk (12/23/2023)  Depression (PHQ2-9): Low Risk  (06/24/2019)  Financial Resource Strain: Not at Risk (01/08/2023)   Received from [facility]  Physical Activity: Not on File (12/13/2022)   Received from [facility]  Social Connections: Not on File (02/05/2023)   Received from [facility]  Stress: Not on File (12/13/2022)   Received from [facility]  Tobacco Use: High Risk (12/24/2023)   SDOH Interventions:     Readmission Risk Interventions     No data to display

## 2023-12-24 NOTE — Evaluation (Signed)
 Physical Therapy Evaluation Patient Details Name: Mark Herrera MRN: [id number] DOB: April 22, 1961 Today's Date: 12/24/2023  History of Present Illness  Pt is a 63 y.o. M presenting to [facility] on 12/23/23 following a fall w/ sudden onset of L sided weakness. Pt admitted w/ R MCA stroke and is s/p R common carotid arteriogram. PMH is significant for multiple prior strokes w/ residual R hemiparesis and L sided deficits, CAD, and HLD.  Clinical Impression  Prior to admittance, pt was independent with mobility and with ADLs. Pt presents to evaluation with deficits in mobility, strength, balance, power, activity tolerance, and impaired sensation, all limiting pt's ability to mobilize near baseline. Pt was able to ambulate without an AD and no losses of balance noted; see gait section for further detail. Pt was educated on general LE exercises to address strength deficits (LAQ, seated marches, heel slides, and SLR - 3x10, 3 times per day). Pt would benefit from trial of SPC or walking stick. PT will continue to treat pt while he is admitted. Recommending OPPT at discharge to address remaining mobility deficits and optimize return to PLOF.         If plan is discharge home, recommend the following: A little help with walking and/or transfers;A little help with bathing/dressing/bathroom;Assist for transportation;Help with stairs or ramp for entrance   Can travel by private vehicle        Equipment Recommendations None recommended by PT  Recommendations for Other Services       Functional Status Assessment Patient has had a recent decline in their functional status and demonstrates the ability to make significant improvements in function in a reasonable and predictable amount of time.     Precautions / Restrictions Precautions Precautions: Fall Recall of Precautions/Restrictions: Intact Restrictions Weight Bearing Restrictions Per Provider Order: No      Mobility  Bed Mobility Overal bed  mobility: Needs Assistance Bed Mobility: Supine to Sit     Supine to sit: Supervision, HOB elevated     General bed mobility comments: Increased time to complete    Transfers Overall transfer level: Needs assistance Equipment used: Rolling walker (2 wheels) Transfers: Sit to/from Stand Sit to Stand: Contact guard assist           General transfer comment: STS from edge of the bed with RW. VC given for UE and LE placement, as well as for sequencing; increased time to complete    Ambulation/Gait Ambulation/Gait assistance: Contact guard assist Gait Distance (Feet): 95 Feet Assistive device: Rolling walker (2 wheels), None Gait Pattern/deviations: Step-through pattern, Decreased stride length, Decreased step length - left, Decreased stance time - left, Decreased dorsiflexion - left, Decreased weight shift to left, Knee flexed in stance - left, Trunk flexed, Narrow base of support Gait velocity: reduced Gait velocity interpretation: <1.8 ft/sec, indicate of risk for recurrent falls   General Gait Details: Pt ambulated 40' with RW and then 71' without an AD. Pt ambulates w/ reciprocal gait pattern, reduced weight shift to the L, reduced L knee flexion during swing phase, and reduced L knee extension and ankle DF during heel strike and weight acceptance. Pt demonstrates reduced step length and reduced cadence.  Stairs            Wheelchair Mobility     Tilt Bed    Modified Rankin (Stroke Patients Only)       Balance Overall balance assessment: Needs assistance Sitting-balance support: No upper extremity supported, Feet supported Sitting balance-Leahy Scale: Good Sitting balance -  Comments: pt able to don L sock sitting EOB and attempted to don R sock but requires assistance. pt demonstrated good truncal control throughout   Standing balance support: No upper extremity supported, During functional activity Standing balance-Leahy Scale: Fair Standing balance comment:  able to shift weight throughout gait but cannot accept challenge without loss of balance                             Pertinent Vitals/Pain Pain Assessment Pain Assessment: No/denies pain    Home Living Family/patient expects to be discharged to:: Private residence Living Arrangements: Children Available Help at Discharge: Family;Available 24 hours/day Type of Home: House Home Access: Stairs to enter Entrance Stairs-Rails: Doctor, general practice of Steps: 4 (4 in the front and 1 in the back w/out railing)   Home Layout: One level Home Equipment: Grab bars - tub/shower      Prior Function Prior Level of Function : Independent/Modified Independent;Driving;Working/employed             Mobility Comments: independent ADLs Comments: independent. was working as a Museum/gallery exhibitions officer Extremity Assessment: Defer to OT evaluation    Lower Extremity Assessment Lower Extremity Assessment: Generalized weakness;LLE deficits/detail LLE Deficits / Details: 3-/5 for L hip flx, 4-/5 for L knee flx and L knee ext, 3-/5 L ankle DF. pt has diminished sensation on the L in all dermatomes LLE Sensation: decreased light touch LLE Coordination: decreased fine motor (finger nose finger)    Cervical / Trunk Assessment Cervical / Trunk Assessment: Kyphotic  Communication   Communication Communication: No apparent difficulties    Cognition Arousal: Alert Behavior During Therapy: WFL for tasks assessed/performed   PT - Cognitive impairments: Safety/Judgement                         Following commands: Intact       Cueing Cueing Techniques: Verbal cues, Visual cues, Gestural cues     General Comments General comments (skin integrity, edema, etc.): no signs of acute distress    Exercises     Assessment/Plan    PT Assessment Patient needs continued PT services  PT Problem List Decreased  strength;Decreased range of motion;Decreased activity tolerance;Decreased balance;Decreased mobility;Decreased coordination;Decreased knowledge of use of DME;Decreased safety awareness;Impaired sensation       PT Treatment Interventions DME instruction;Gait training;Stair training;Functional mobility training;Therapeutic activities;Therapeutic exercise;Balance training;Neuromuscular re-education;Patient/family education;Manual techniques;Modalities    PT Goals (Current goals can be found in the Care Plan section)  Acute Rehab PT Goals Patient Stated Goal: to go home PT Goal Formulation: With patient Time For Goal Achievement: 01/07/24 Potential to Achieve Goals: Good    Frequency Min 4X/week     Co-evaluation               AM-PAC PT 6 Clicks Mobility  Outcome Measure Help needed turning from your back to your side while in a flat bed without using bedrails?: A Little Help needed moving from lying on your back to sitting on the side of a flat bed without using bedrails?: A Little Help needed moving to and from a bed to a chair (including a wheelchair)?: A Little Help needed standing up from a chair using your arms (e.g., wheelchair or bedside chair)?: A Little Help needed to walk in hospital room?: A Little Help needed climbing 3-5 steps with a railing? : Total  6 Click Score: 16    End of Session Equipment Utilized During Treatment: Gait belt Activity Tolerance: Patient tolerated treatment well Patient left: in chair;with call bell/phone within reach;with chair alarm set Nurse Communication: Mobility status PT Visit Diagnosis: Unsteadiness on feet (R26.81);Other abnormalities of gait and mobility (R26.89);Muscle weakness (generalized) (M62.81);Other symptoms and signs involving the nervous system (R29.898)    Time: 9046-8968 PT Time Calculation (min) (ACUTE ONLY): 38 min   Charges:    $1 Low Level Eval              Mark Herrera, SPT Acute  Rehab [phone]   Leontine Hilt 12/24/2023, 10:57 AM

## 2023-12-25 ENCOUNTER — Other Ambulatory Visit (HOSPITAL_COMMUNITY): Payer: Self-pay

## 2023-12-25 DIAGNOSIS — I63521 Cerebral infarction due to unspecified occlusion or stenosis of right anterior cerebral artery: Secondary | ICD-10-CM | POA: Diagnosis not present

## 2023-12-25 DIAGNOSIS — R29700 NIHSS score 0: Secondary | ICD-10-CM | POA: Diagnosis not present

## 2023-12-25 MED ORDER — ATORVASTATIN CALCIUM 80 MG PO TABS
80.0000 mg | ORAL_TABLET | Freq: Every day | ORAL | 1 refills | Status: AC
  Filled 2023-12-25: qty 30, 30d supply, fill #0

## 2023-12-25 MED ORDER — CLOPIDOGREL BISULFATE 75 MG PO TABS
75.0000 mg | ORAL_TABLET | Freq: Every day | ORAL | 1 refills | Status: AC
  Filled 2023-12-25: qty 30, 30d supply, fill #0

## 2023-12-25 MED ORDER — ASPIRIN 81 MG PO CHEW
81.0000 mg | CHEWABLE_TABLET | Freq: Every day | ORAL | 1 refills | Status: AC
  Filled 2023-12-25: qty 30, 30d supply, fill #0

## 2023-12-25 NOTE — Plan of Care (Signed)
  Problem: Ischemic Stroke/TIA Tissue Perfusion: Goal: Complications of ischemic stroke/TIA will be minimized Outcome: Progressing   Problem: Health Behavior/Discharge Planning: Goal: Ability to manage health-related needs will improve Outcome: Progressing   Problem: Clinical Measurements: Goal: Will remain free from infection Outcome: Progressing   Problem: Nutrition: Goal: Adequate nutrition will be maintained Outcome: Progressing   Problem: Coping: Goal: Level of anxiety will decrease Outcome: Progressing   Problem: Safety: Goal: Ability to remain free from injury will improve Outcome: Progressing

## 2023-12-25 NOTE — Progress Notes (Signed)
 Physical Therapy Treatment Patient Details Name: Mark Herrera MRN: [id number] DOB: [birth year] Today's Date: 12/25/2023   History of Present Illness Pt is a 63 y.o. M presenting to [facility] on 12/23/23 following a fall w/ sudden onset of L sided weakness. Pt admitted w/ R MCA stroke and is s/p R common carotid arteriogram. PMH is significant for multiple prior strokes w/ residual R hemiparesis and L sided deficits, CAD, and HLD.   PT Comments  Pt in bed upon arrival and agreeable to PT session. Trialed SP cane in today's session with pt preferring either no AD or SP cane. Pt initially stands with forward flexed trunk and shuffling steps. With increased repetitions and gait training, pt was able to move into an upright posture with improved stability. Pt reports this is typical of how he would stand and walk prior to admit. When using no AD and SP cane, pt was slightly unsteady, however, no overt LOB. Discussed using a RW for improved stability with pt declining at this time. Recommending SP cane as this is the AD that pt has agreed to use consistently. Worked on stair negotiation with use of one handrail and SP cane. Able to ascend/descend 5 steps with MinA/CGA for slight steadying assist. Demonstrated and discussed proper guarding with pt verbalizing understanding. Continue to recommend OP PT with acute PT to follow.     If plan is discharge home, recommend the following: A little help with walking and/or transfers;A little help with bathing/dressing/bathroom;Assist for transportation;Help with stairs or ramp for entrance   Can travel by private vehicle      Yes  Equipment Recommendations  Other (comment) (SP cane (pt declines RW))       Precautions / Restrictions Precautions Precautions: Fall Recall of Precautions/Restrictions: Impaired Restrictions Weight Bearing Restrictions Per Provider Order: No     Mobility  Bed Mobility Overal bed mobility: Needs Assistance Bed Mobility: Supine to  Sit    Supine to sit: Supervision, HOB elevated     Transfers Overall transfer level: Needs assistance Equipment used: None, Straight cane Transfers: Sit to/from Stand Sit to Stand: Contact guard assist, Min assist    General transfer comment: MinA/CGA for slight boost-up and steadying. With multiple repetitions, able to progress to CGA. Initially, pt with forward flexed trunk    Ambulation/Gait Ambulation/Gait assistance: Contact guard assist, Min assist Gait Distance (Feet): 300 Feet Assistive device: Straight cane, None Gait Pattern/deviations: Step-through pattern, Decreased stride length, Decreased step length - left, Decreased stance time - left, Decreased dorsiflexion - left, Decreased weight shift to left, Knee flexed in stance - left, Trunk flexed, Narrow base of support Gait velocity: reduced    General Gait Details: pt declined ambulating with RW with preference for SP cane or no AD. Slightly unsteady with no overt LOB. Cues for technique with SP cane with pt advancing cane with LLE. Decreased weight shift to LLE with limited knee flexion in swing phase   Stairs Stairs: Yes Stairs assistance: Contact guard assist, Min assist Stair Management: One rail Left, Step to pattern, Forwards Number of Stairs: 5 General stair comments: one hand on handrail and SP cane. MinA for steadying assist with cues for technique with cane.      Modified Rankin (Stroke Patients Only) Modified Rankin (Stroke Patients Only) Pre-Morbid Rankin Score: No significant disability Modified Rankin: Slight disability     Balance Overall balance assessment: Needs assistance Sitting-balance support: No upper extremity supported, Feet supported Sitting balance-Leahy Scale: Good     Standing  balance support: No upper extremity supported, During functional activity Standing balance-Leahy Scale: Fair    High Level Balance Comments: BX10 stepping over obstacle         Communication  Communication Communication: No apparent difficulties  Cognition Arousal: Alert Behavior During Therapy: WFL for tasks assessed/performed   PT - Cognitive impairments: Safety/Judgement       Following commands: Intact      Cueing Cueing Techniques: Verbal cues, Visual cues, Gestural cues  Exercises Other Exercises Other Exercises: x10 STS with 1UE support, CGA to MinA        Pertinent Vitals/Pain Pain Assessment Pain Assessment: No/denies pain     PT Goals (current goals can now be found in the care plan section) Acute Rehab PT Goals Patient Stated Goal: to go home PT Goal Formulation: With patient Time For Goal Achievement: 01/07/24 Potential to Achieve Goals: Good Progress towards PT goals: Progressing toward goals    Frequency    Min 3X/week       AM-PAC PT 6 Clicks Mobility   Outcome Measure  Help needed turning from your back to your side while in a flat bed without using bedrails?: A Little Help needed moving from lying on your back to sitting on the side of a flat bed without using bedrails?: A Little Help needed moving to and from a bed to a chair (including a wheelchair)?: A Little Help needed standing up from a chair using your arms (e.g., wheelchair or bedside chair)?: A Little Help needed to walk in hospital room?: A Little Help needed climbing 3-5 steps with a railing? : A Little 6 Click Score: 18    End of Session Equipment Utilized During Treatment: Gait belt Activity Tolerance: Patient tolerated treatment well Patient left: in bed;with call bell/phone within reach Nurse Communication: Mobility status PT Visit Diagnosis: Unsteadiness on feet (R26.81);Other abnormalities of gait and mobility (R26.89);Muscle weakness (generalized) (M62.81);Other symptoms and signs involving the nervous system (R29.898)     Time: 9044-8977 PT Time Calculation (min) (ACUTE ONLY): 27 min  Charges:    $Gait Training: 8-22 mins $Therapeutic Activity: 8-22  mins PT General Charges $$ ACUTE PT VISIT: 1 Visit                     Kate ORN, PT, DPT Secure Chat Preferred  Rehab Office [phone]    Kate BRAVO Wendolyn 12/25/2023, 12:20 PM

## 2023-12-25 NOTE — Plan of Care (Signed)
   Problem: Education: Goal: Knowledge of disease or condition will improve Outcome: Progressing Goal: Knowledge of secondary prevention will improve (MUST DOCUMENT ALL) Outcome: Progressing Goal: Knowledge of patient specific risk factors will improve (DELETE if not current risk factor) Outcome: Progressing   Problem: Ischemic Stroke/TIA Tissue Perfusion: Goal: Complications of ischemic stroke/TIA will be minimized Outcome: Progressing

## 2023-12-25 NOTE — TOC Transition Note (Signed)
 Transition of Care [facility]) - Discharge Note   Patient Details  Name: Mark Herrera MRN: [id number] Date of Birth: 1960/12/03  Transition of [facility]) CM/SW Contact:  Andrez JULIANNA George, RN Phone Number: 12/25/2023, 1:05 PM   Clinical Narrative:     Pt is discharging home with outpatient therapy referral sent to [facility]. Pt will call to schedule the first appointment. Information is on the AVS.  Tub bench for home ordered through [employer] and will be delivered to the room. CM has added transportation information for his medicaid to AVS to assist with transportation outside the hospital.  Today CM will provide cab voucher to get him home.  CM has sent email to financial counseling per pt request for disability.   Final next level of care: OP Rehab Barriers to Discharge: No Barriers Identified   Patient Goals and CMS Choice Patient states their goals for this hospitalization and ongoing recovery are:: Obtain Disability assistance   Choice offered to / list presented to : Patient      Discharge Placement                       Discharge Plan and Services Additional resources added to the After Visit Summary for   In-house Referral: Financial Counselor              DME Arranged: Tub bench DME Agency: [employer] Date DME Agency Contacted: 12/25/23   Representative spoke with at DME Agency: Darlyn            Social Drivers of Health (SDOH) Interventions SDOH Screenings   Food Insecurity: No Food Insecurity (12/23/2023)  Housing: Low Risk  (12/23/2023)  Transportation Needs: No Transportation Needs (12/23/2023)  Utilities: Not At Risk (12/23/2023)  Depression (PHQ2-9): Low Risk  (06/24/2019)  Financial Resource Strain: Not at Risk (01/08/2023)   Received from [facility]  Physical Activity: Not on File (12/13/2022)   Received from [facility]  Social Connections: Not on File (02/05/2023)   Received from [facility]  Stress: Not on File (12/13/2022)   Received from [facility]   Tobacco Use: High Risk (12/24/2023)     Readmission Risk Interventions     No data to display

## 2024-01-07 ENCOUNTER — Telehealth: Payer: Self-pay

## 2024-01-07 NOTE — Patient Outreach (Signed)
 First telephone outreach attempt to obtain mRS. No answer. Left message for returned call.  [facility]  Population Health Care Management Assistant  Direct Dial: [phone]  Fax: [phone] Website: [email or web address]

## 2024-01-08 ENCOUNTER — Telehealth: Payer: Self-pay

## 2024-01-08 NOTE — Patient Outreach (Signed)
 Telephone outreach to patient to obtain mRS was successfully completed. MRS= 1  [name] [facility] Health Care Management Assistant  Direct Dial: [phone]  Fax: [phone] Website: [email or web address]

## 2024-01-12 NOTE — Therapy (Signed)
 OUTPATIENT OCCUPATIONAL THERAPY NEURO EVALUATION  Patient Name: Mark Herrera MRN: [id number] DOB:December 11, 1960, 63 y.o., male Today's Date: 01/13/2024  PCP: [facility] REFERRING PROVIDER: Jerri Pfeiffer, MD  END OF SESSION:  OT End of Session - 01/13/24 1213     Visit Number 1    Number of Visits 9    Date for OT Re-Evaluation 03/02/24    Authorization Type [employer] - needs auth    OT Start Time 1015    OT Stop Time 1100    OT Time Calculation (min) 45 min    Activity Tolerance Patient tolerated treatment well    Behavior During Therapy [facility] for tasks assessed/performed          Past Medical History:  Diagnosis Date   CAD (coronary artery disease)    10/08/16 STEMI DES x1 to pLAD EF 45%   Cocaine abuse (HCC)    CVA (cerebral vascular accident) (HCC)    Gout    Tobacco abuse    Past Surgical History:  Procedure Laterality Date   CORONARY STENT INTERVENTION N/A 10/08/2016   Procedure: Coronary Stent Intervention;  Surgeon: Verlin Lonni BIRCH, MD;  [facility]: [facility] INVASIVE CV LAB;  Service: Cardiovascular;  Laterality: N/A;   IR CT HEAD LTD  05/31/2020   IR CT HEAD LTD  09/26/2023   IR CT HEAD LTD  12/23/2023   IR PERCUTANEOUS ART THROMBECTOMY/INFUSION INTRACRANIAL INC DIAG ANGIO  05/31/2020   IR PERCUTANEOUS ART THROMBECTOMY/INFUSION INTRACRANIAL INC DIAG ANGIO  09/26/2023   IR PERCUTANEOUS ART THROMBECTOMY/INFUSION INTRACRANIAL INC DIAG ANGIO  12/23/2023   IR US  GUIDE VASC ACCESS RIGHT  05/31/2020   IR US  GUIDE VASC ACCESS RIGHT  09/26/2023   LEFT HEART CATH AND CORONARY ANGIOGRAPHY N/A 10/08/2016   Procedure: Left Heart Cath and Coronary Angiography;  Surgeon: Verlin Lonni BIRCH, MD;  Location: [facility] INVASIVE CV LAB;  Service: Cardiovascular;  Laterality: N/A;   RADIOLOGY WITH ANESTHESIA N/A 05/31/2020   Procedure: IR WITH ANESTHESIA;  Surgeon: Radiologist, Medication, MD;  Location: [facility] OR;  Service: Radiology;  Laterality: N/A;   RADIOLOGY WITH  ANESTHESIA N/A 09/05/2021   Procedure: RADIOLOGY WITH ANESTHESIA;  Surgeon: Radiologist, Medication, MD;  Location: [facility] OR;  Service: Radiology;  Laterality: N/A;   RADIOLOGY WITH ANESTHESIA N/A 09/26/2023   Procedure: RADIOLOGY WITH ANESTHESIA;  Surgeon: Radiologist, Medication, MD;  Location: [facility] OR;  Service: Radiology;  Laterality: N/A;   RADIOLOGY WITH ANESTHESIA N/A 12/23/2023   Procedure: RADIOLOGY WITH ANESTHESIA;  Surgeon: Dolphus Carrion, MD;  Location: [facility] OR;  Service: Radiology;  Laterality: N/A;   TOE SURGERY     Patient Active Problem List   Diagnosis Date Noted   Acute ischemic right MCA stroke (HCC) 12/23/2023   Acute ischemic right anterior cerebral artery (ACA) stroke (HCC) 12/23/2023   Acute ischemic stroke (HCC) 09/26/2023   Right middle cerebral artery stroke (HCC)    Bradycardia    Dyslipidemia    Polysubstance abuse (HCC)    CVA (cerebral vascular accident) (HCC) 09/09/2021   Status post stroke 05/31/2020   Stroke (cerebrum) (HCC) 05/31/2020   Essential hypertension 06/01/2019   Cocaine abuse (HCC) 06/01/2019   Stroke-like episode s/p tPA 05/31/2019   Hyperlipemia 10/11/2016   Tobacco abuse 10/11/2016   STEMI (ST elevation myocardial infarction) (HCC) 10/09/2016   Acute ST elevation myocardial infarction (STEMI) involving left anterior descending (LAD) coronary artery (HCC)     ONSET DATE: 12/25/2023 (referral date)   REFERRING DIAG: I63.9 (ICD-10-CM) - Acute  CVA (cerebrovascular accident) (HCC)  THERAPY DIAG:  Other lack of coordination  Muscle weakness (generalized)  Unsteadiness on feet  Other disturbances of skin sensation  Rationale for Evaluation and Treatment: Rehabilitation  SUBJECTIVE:   SUBJECTIVE STATEMENT: I know this is my wake up call and that I'm fortunate it hasn't been more serious Pt accompanied by: self  PERTINENT HISTORY:  presenting to [facility] on [facility]23 following a fall w/ sudden onset of L sided weakness. Pt admitted w/ R MCA  stroke and is s/p R common carotid arteriogram. History of multiple prior strokes with residual right (2020 stroke) and left hemiparesis (2023 stroke), cocaine abuse, tobacco abuse, alcoholism, CAD and hyperlipidemia was admitted with sudden onset left-sided weakness which he noticed after a fall.  PRECAUTIONS: None  WEIGHT BEARING RESTRICTIONS: No  PAIN:  Are you having pain? Chronic Rt hip and leg pain  FALLS: Has patient fallen in last 6 months? Yes. 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  HAND DOMINANCE: Right  ADLs: Transfers/ambulation related to ADLs: Independent Eating: mod I  Grooming: mod I  UB Dressing: mod I  LB Dressing: mod I  Toileting: mod I  Bathing: mod I  Tub Shower transfers: mod I - tub/shower combo Equipment: Grab bars  IADLs: Shopping: independent  Light housekeeping: pt has returned to light housework  Meal Prep: return to light cooking but question safety - hypersensitivity Rt hand from previous stroke Community mobility: back to light driving  Medication management: independent  Financial management: independent  Handwriting: no changes  MOBILITY STATUS: Independent   FUNCTIONAL OUTCOME MEASURES: Upper Extremity Functional Scale (UEFS): 68/80 = 85% functioning (15% deficit)   UPPER EXTREMITY ROM:  RUE AROM WNL's LUE AROM WFL's w/ mild compensations at shoulder    UPPER EXTREMITY MMT:   RUE MMT grossly 4+/5, LUE MMT grossly 4/5   HAND FUNCTION: Grip strength: Right:  72.5 lbs; Left: 44 lbs  COORDINATION: 9 Hole Peg test: Right: 42.51 sec; Left: 57.13 sec  SENSATION: Pt could detect touch and localization in both hands separately however could not detect fingertips Lt when simultaneously stimulated with Rt. Pt also reports hypersensitivity with Rt hand especially with heat  EDEMA: none   COGNITION: Overall cognitive status: Within functional limits for tasks assessed  VISION: Subjective report: denies change  Baseline vision: Wears glasses for reading only   PERCEPTION: Not tested  PRAXIS: Not tested  OBSERVATIONS: pt pleasant, walks w/o device, main O.T.  deficit is [facility]  TREATMENT DATE: 01/13/24    No charge for today's treatment d/t payor source:   Discussed O.T. findings and POC/goals     PATIENT EDUCATION: Education details: see above Person educated: Patient Education method: Explanation Education comprehension: verbalized understanding  HOME EXERCISE PROGRAM: N/A   GOALS: Goals reviewed with patient? Yes   LONG TERM GOALS: Target date: 03/02/24  Independent with bilateral coordination HEP and Lt putty HEP  Baseline: Not yet addressed Goal status: INITIAL  2.  Pt to verbalize understanding with sensory loss Lt hand and hypersensitivity Rt hand and task modifications and A/E to increase safety with ADLS Baseline: Not yet addressed Goal status: INITIAL  3.  Pt to improve bilateral coordination as evidenced by reducing speed on 9 hole peg test by 5 sec or more Baseline: Rt = 42.51 sec, Lt = 57.13 sec Goal status: INITIAL  4.  Improve LUE grip strength by 5 or more lbs for opening tight containers Baseline: 44 lbs Goal status: INITIAL  5.  UEFI scale to be 90% or higher  Baseline: 85% Goal status: INITIAL   ASSESSMENT:  CLINICAL IMPRESSION: Patient is a 63 y.o. male who was seen  today for occupational therapy evaluation for acute CVA. Hx includes multiple CVA's w/ mild residual Rt hemiparesis, and Lt hemiparesis but worse Lt side symptoms since recent stroke. Patient currently presents below baseline level of functioning demonstrating functional deficits and impairments as noted below. Pt would benefit from skilled OT services in the outpatient setting to work on impairments as noted below to help pt return to PLOF as able.   SABRA   PERFORMANCE DEFICITS: in functional skills including ADLs, IADLs, coordination, dexterity, sensation, strength, Fine motor control, Gross motor control, mobility, body mechanics, decreased knowledge of precautions, decreased knowledge of use of DME, and UE functional use.   IMPAIRMENTS: are limiting patient from ADLs, IADLs, work, and leisure.   CO-MORBIDITIES: may have co-morbidities  that affects occupational performance. Patient will benefit from skilled OT to address above impairments and improve overall function.  MODIFICATION OR ASSISTANCE TO COMPLETE EVALUATION: No modification of tasks or assist necessary to complete an evaluation.  OT OCCUPATIONAL PROFILE AND HISTORY: Detailed assessment: Review of records and additional review of physical, cognitive, psychosocial history related to current functional performance.  CLINICAL DECISION MAKING: Moderate - several treatment options, min-mod task modification necessary  REHAB POTENTIAL: Good  EVALUATION COMPLEXITY: Moderate    PLAN:  OT FREQUENCY: 2x/week  OT DURATION: 4 weeks (plus evaluation)  PLANNED INTERVENTIONS: 97535 self care/ADL training, 02889 therapeutic exercise, 97530 therapeutic activity, 97112 neuromuscular re-education, 97140 manual therapy, 97035 ultrasound, 97018 paraffin, 02960 fluidotherapy, 97010 moist heat, passive range of motion, patient/family education, and DME and/or AE instructions  RECOMMENDED OTHER SERVICES: none at this time  CONSULTED AND AGREED WITH  PLAN OF CARE: Patient  PLAN FOR NEXT SESSION: coordination HEP (bilateral) and Lt putty HEP   For all possible CPT codes, reference the Planned Interventions line above.     Check all conditions that are expected to impact treatment: {Conditions expected to impact treatment:Neurological condition and/or seizures and Social determinants of health   If treatment provided at initial evaluation, no treatment charged due to lack of authorization.        Burnard JINNY Roads, OT 01/13/2024, 12:14 PM

## 2024-01-13 ENCOUNTER — Ambulatory Visit: Attending: Neurology | Admitting: Physical Therapy

## 2024-01-13 ENCOUNTER — Ambulatory Visit: Admitting: Occupational Therapy

## 2024-01-13 VITALS — BP 121/66 | HR 59

## 2024-01-13 DIAGNOSIS — R208 Other disturbances of skin sensation: Secondary | ICD-10-CM | POA: Diagnosis present

## 2024-01-13 DIAGNOSIS — R278 Other lack of coordination: Secondary | ICD-10-CM | POA: Diagnosis present

## 2024-01-13 DIAGNOSIS — R2681 Unsteadiness on feet: Secondary | ICD-10-CM

## 2024-01-13 DIAGNOSIS — I639 Cerebral infarction, unspecified: Secondary | ICD-10-CM | POA: Insufficient documentation

## 2024-01-13 DIAGNOSIS — M6281 Muscle weakness (generalized): Secondary | ICD-10-CM | POA: Insufficient documentation

## 2024-01-13 DIAGNOSIS — R2689 Other abnormalities of gait and mobility: Secondary | ICD-10-CM | POA: Insufficient documentation

## 2024-01-13 NOTE — Therapy (Signed)
 OUTPATIENT PHYSICAL THERAPY NEURO EVALUATION   Patient Name: Mark Herrera MRN: [id number] DOB:August 01, 1960, 63 y.o., male Today's Date: 01/13/2024   PCP: [facility] REFERRING PROVIDER: Jerri Pfeiffer, MD  END OF SESSION:  PT End of Session - 01/13/24 0935     Visit Number 1    Number of Visits 9   with eval   Date for PT Re-Evaluation 02/24/24    Authorization Type Medicaid    PT Start Time 0930    PT Stop Time 1015    PT Time Calculation (min) 45 min    Equipment Utilized During Treatment Gait belt    Activity Tolerance Patient tolerated treatment well    Behavior During Therapy WFL for tasks assessed/performed          Past Medical History:  Diagnosis Date   CAD (coronary artery disease)    10/08/16 STEMI DES x1 to pLAD EF 45%   Cocaine abuse (HCC)    CVA (cerebral vascular accident) (HCC)    Gout    Tobacco abuse    Past Surgical History:  Procedure Laterality Date   CORONARY STENT INTERVENTION N/A 10/08/2016   Procedure: Coronary Stent Intervention;  Surgeon: Verlin Lonni BIRCH, MD;  [facility]AS[facility] LAB;  Service: Cardiovascular;  Laterality: N/A;   IR CT HEAD LTD  05/31/2020   IR CT HEAD LTD  09/26/2023   IR CT HEAD LTD  12/23/2023   IR PERCUTANEOUS ART THROMBECTOMY/INFUSION INTRACRANIAL INC DIAG ANGIO  05/31/2020   IR PERCUTANEOUS ART THROMBECTOMY/INFUSION INTRACRANIAL INC DIAG ANGIO  09/26/2023   IR PERCUTANEOUS ART THROMBECTOMY/INFUSION INTRACRANIAL INC DIAG ANGIO  12/23/2023   IR US  GUIDE VASC ACCESS RIGHT  05/31/2020   IR US  GUIDE VASC ACCESS RIGHT  09/26/2023   LEFT HEART CATH AND CORONARY ANGIOGRAPHY N/A 10/08/2016   Procedure: Left Heart Cath and Coronary Angiography;  Surgeon: Verlin Lonni BIRCH, MD;  Location: [facility] INVASIVE CV LAB;  Service: Cardiovascular;  Laterality: N/A;   RADIOLOGY WITH ANESTHESIA N/A 05/31/2020   Procedure: IR WITH ANESTHESIA;  Surgeon: Radiologist, Medication, MD;  Location: [facility] OR;  Service:  Radiology;  Laterality: N/A;   RADIOLOGY WITH ANESTHESIA N/A 09/05/2021   Procedure: RADIOLOGY WITH ANESTHESIA;  Surgeon: Radiologist, Medication, MD;  Location: [facility] OR;  Service: Radiology;  Laterality: N/A;   RADIOLOGY WITH ANESTHESIA N/A 09/26/2023   Procedure: RADIOLOGY WITH ANESTHESIA;  Surgeon: Radiologist, Medication, MD;  Location: [facility] OR;  Service: Radiology;  Laterality: N/A;   RADIOLOGY WITH ANESTHESIA N/A 12/23/2023   Procedure: RADIOLOGY WITH ANESTHESIA;  Surgeon: Dolphus Carrion, MD;  Location: [facility] OR;  Service: Radiology;  Laterality: N/A;   TOE SURGERY     Patient Active Problem List   Diagnosis Date Noted   Acute ischemic right MCA stroke (HCC) 12/23/2023   Acute ischemic right anterior cerebral artery (ACA) stroke (HCC) 12/23/2023   Acute ischemic stroke (HCC) 09/26/2023   Right middle cerebral artery stroke (HCC)    Bradycardia    Dyslipidemia    Polysubstance abuse (HCC)    CVA (cerebral vascular accident) (HCC) 09/09/2021   Status post stroke 05/31/2020   Stroke (cerebrum) (HCC) 05/31/2020   Essential hypertension 06/01/2019   Cocaine abuse (HCC) 06/01/2019   Stroke-like episode s/p tPA 05/31/2019   Hyperlipemia 10/11/2016   Tobacco abuse 10/11/2016   STEMI (ST elevation myocardial infarction) (HCC) 10/09/2016   Acute ST elevation myocardial infarction (STEMI) involving left anterior descending (LAD) coronary artery (HCC)     ONSET DATE: 12/25/2023 (  referral date, CVA 12/23/23)  REFERRING DIAG: I63.9 (ICD-10-CM) - Acute CVA (cerebrovascular accident) (HCC)  THERAPY DIAG:  Muscle weakness (generalized)  Other abnormalities of gait and mobility  Unsteadiness on feet  Rationale for Evaluation and Treatment: Rehabilitation  SUBJECTIVE:                                                                                                                                                                                             SUBJECTIVE STATEMENT: Pt reports that  he had a fall on 12/23/23 in his kitchen, he was not able to get back up by himself. His grandson came in and tried to help him up, he went to the hospital and found out he had a second stroke. He reports ongoing mild weakness on his L side as well as some memory and problem-solving impairments. 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 He has also noticed increased sensitivity in his L arm and L leg since the 2nd stroke. Pt also reports that when he gets up from sitting for a long time or gets up out of bed he is very stiff and stumbles, takes a little bit to loosen up his body.  Pt accompanied by: self  PERTINENT HISTORY: PMH is significant for multiple prior strokes w/ residual R hemiparesis and L sided deficits, CAD, and HLD.  Per Acute Care PT Note 12/24/2023: Pt is a 63 y.o. M presenting to [facility] on 12/23/23 following a fall w/ sudden onset of L sided weakness. Pt admitted w/ R MCA stroke and is s/p R common carotid arteriogram. PMH is significant for multiple prior strokes w/ residual R hemiparesis and L sided deficits, CAD, and HLD.  PAIN:  Are you having pain? No, stiffness  PRECAUTIONS: Fall  RED FLAGS: None   WEIGHT BEARING RESTRICTIONS: No  FALLS: Has patient fallen in last 6 months? Yes. Number of falls 1 when he had the stroke, he was unable to get back up by himself and his grandson had to help him  LIVING ENVIRONMENT: Lives with: lives with their family, he lives with his daughter and son Lives in: House/apartment Stairs: Yes: External: 3 steps; can reach both Has following equipment at home: Environmental consultant - 2 wheeled and Shower bench, grab bars in shower  PLOF: Independent  PATIENT GOALS: to at least have 80% of my mobility back, I'm at a cool 65% right now  OBJECTIVE:  Note: Objective measures were completed at Evaluation unless otherwise noted.  DIAGNOSTIC FINDINGS:   Brain MRI 12/24/2023 IMPRESSION: 1. Small focus  of acute ischemia within  the anterior right frontal white matter. 2. Old right MCA territory infarct with encephalomalacia, gliosis, and chronic blood products. 3. Multifocal hyperintense T2-weighted signal within the cerebral white matter, most commonly due to chronic small vessel disease.  COGNITION: Overall cognitive status: pt reports mild memory impairments, takes longer than normal for problem solving   SENSATION: Decreased on L side as compared to R side Hypersensitivity to vibration on his L side   COORDINATION: WFL in BLE  EDEMA:  None  POSTURE: rounded shoulders, forward head, and posterior pelvic tilt  LOWER EXTREMITY ROM:     Active  Right Eval Left Eval  Hip flexion Tight hip flexors Tight hip flexors  Hip extension    Hip abduction    Hip adduction    Hip internal rotation    Hip external rotation    Knee flexion    Knee extension Tight HS Tight HS  Ankle dorsiflexion Tight heel cords Tight heel cords  Ankle plantarflexion    Ankle inversion    Ankle eversion     (Blank rows = not tested)  LOWER EXTREMITY MMT:    MMT Right Eval Left Eval  Hip flexion 5 4  Hip extension    Hip abduction    Hip adduction    Hip internal rotation    Hip external rotation    Knee flexion 5 4  Knee extension 5 4  Ankle dorsiflexion 5 4  Ankle plantarflexion    Ankle inversion    Ankle eversion    (Blank rows = not tested)  BED MOBILITY:  Mod I per patient report  TRANSFERS: Sit to stand: Modified independence  Assistive device utilized: None     Stand to sit: Modified independence  Assistive device utilized: None     Chair to chair: Modified independence  Assistive device utilized: None       RAMP:  Not tested  CURB:  Not tested  STAIRS: Not tested GAIT: Findings:  Gait pattern: very stiff movements, decreased hip/knee flexion- Right, decreased hip/knee flexion- Left, antalgic, and trunk flexed Distance walked: various clinic  distances Assistive device utilized: None Level of assistance: Modified independence Comments: see above   FUNCTIONAL TESTS:    [facility] PT Assessment - 01/13/24 0957       Ambulation/Gait   Gait velocity 32.8 ft over 10.12 sec = 3.24 ft/sec      Standardized Balance Assessment   Standardized Balance Assessment Timed Up and Go Test;Five Times Sit to Stand    Five times sit to stand comments  15.32 sec   no UE     Timed Up and Go Test   TUG Normal TUG    Normal TUG (seconds) 14.6   no AD     Functional Gait  Assessment   Gait assessed  Yes    Gait Level Surface Walks 20 ft in less than 7 sec but greater than 5.5 sec, uses assistive device, slower speed, mild gait deviations, or deviates 6-10 in outside of the 12 in walkway width.    Change in Gait Speed Able to change speed, demonstrates mild gait deviations, deviates 6-10 in outside of the 12 in walkway width, or no gait deviations, unable to achieve a major change in velocity, or uses a change in velocity, or uses an assistive device.    Gait with Horizontal Head Turns Performs head turns smoothly with slight change in gait velocity (eg, minor disruption to smooth gait path), deviates 6-10 in outside 12 in walkway width,  or uses an assistive device.    Gait with Vertical Head Turns Performs task with slight change in gait velocity (eg, minor disruption to smooth gait path), deviates 6 - 10 in outside 12 in walkway width or uses assistive device    Gait and Pivot Turn Pivot turns safely in greater than 3 sec and stops with no loss of balance, or pivot turns safely within 3 sec and stops with mild imbalance, requires small steps to catch balance.    Step Over Obstacle Is able to step over one shoe box (4.5 in total height) without changing gait speed. No evidence of imbalance.    Gait with Narrow Base of Support Ambulates 4-7 steps.    Gait with Eyes Closed Walks 20 ft, uses assistive device, slower speed, mild gait deviations, deviates  6-10 in outside 12 in walkway width. Ambulates 20 ft in less than 9 sec but greater than 7 sec.    Ambulating Backwards Walks 20 ft, uses assistive device, slower speed, mild gait deviations, deviates 6-10 in outside 12 in walkway width.    Steps Alternating feet, must use rail.    Total Score 19    FGA comment: 19/30                                                                                                                                      TREATMENT DATE: PT Evaluation  Self-Care/Home Management  Vitals:   01/13/24 0954  BP: 121/66  Pulse: (!) 59   Pt does not currently have a machine at home, his girlfriend has one and he is able to check it at her place. Pt reports when he checked his BP the other day it was 191/112, vitals WNL when assessed this session.   PATIENT EDUCATION: Education details: Eval findings, PT POC, results of OM and functional implications Person educated: Patient Education method: Explanation and Demonstration Education comprehension: verbalized understanding, returned demonstration, and needs further education  HOME EXERCISE PROGRAM: To be initiated  GOALS: Goals reviewed with patient? Yes  SHORT TERM GOALS=LONG TERM GOALS due to length of POC   LONG TERM GOALS: Target date: 02/19/2024   Pt will be independent with final HEP for improved strength, balance, transfers and gait. Baseline:  Goal status: INITIAL  2.  Pt will improve gait velocity to at least 3.5 ft/sec for improved gait efficiency and performance at mod I level  Baseline: 3.24 ft/sec mod I (8/12) Goal status: INITIAL  3.  Pt will improve FGA to 23/30 for decreased fall risk  Baseline: 19/30 Goal status: INITIAL  4.  Pt will trial LRAD to determine if he could benefit from use of an AD for improved balance and decreased fall risk. Baseline: no AD at baseline Goal status: INITIAL   ASSESSMENT:  CLINICAL IMPRESSION: Patient is a 63 year old male referred to Neuro O[facility]  for L sided weakness following a recent  CVA.   Pt's PMH is significant for: multiple prior strokes w/ residual R hemiparesis and L sided deficits, CAD, and HLD. The following deficits were present during the exam: decreased LE strength, decreased LE ROM, impaired balance, and sensory impairments. Based on his fall history, strength, ROM, and sensory impairments, and FGA score of 19/30, pt is an increased risk for falls. Pt would benefit from skilled PT to address these impairments and functional limitations to maximize functional mobility independence.   OBJECTIVE IMPAIRMENTS: Abnormal gait, decreased balance, decreased cognition, decreased mobility, difficulty walking, decreased ROM, decreased strength, impaired perceived functional ability, impaired sensation, and postural dysfunction.   ACTIVITY LIMITATIONS: carrying, lifting, bending, sitting, standing, squatting, stairs, transfers, and bed mobility  PARTICIPATION LIMITATIONS: driving, community activity, and occupation  PERSONAL FACTORS: Fitness and 3+ comorbidities:  multiple prior strokes w/ residual R hemiparesis and L sided deficits, CAD, and HLD.are also affecting patient's functional outcome.   REHAB POTENTIAL: Good  CLINICAL DECISION MAKING: Evolving/moderate complexity  EVALUATION COMPLEXITY: Moderate  PLAN:  PT FREQUENCY: 2x/week  PT DURATION: 4 weeks  PLANNED INTERVENTIONS: 97164- PT Re-evaluation, 97750- Physical Performance Testing, 97110-Therapeutic exercises, 97530- Therapeutic activity, [id number]- Neuromuscular re-education, 97535- Self Care, 02859- Manual therapy, [id number]- Gait training, [phone]- Orthotic Initial, [phone]- Orthotic/Prosthetic subsequent, [phone]- Electrical stimulation (manual), [phone] (1-2 muscles), 20561 (3+ muscles)- Dry Needling, Patient/Family education, Balance training, Stair training, Taping, Joint mobilization, Spinal mobilization, Cognitive remediation, DME instructions, Cryotherapy, and Moist heat  PLAN  FOR NEXT SESSION: assess vitals, pt needs to get a BP machine for use at home, stretching program (modified Thomas, HS, gastroc) and functional strengthening (sit to stands, step ups), dynamic balance (gait with head turns, stepping over obstacles, tandem gait), trial SPC   Waddell Southgate, PT Waddell Southgate, PT, DPT, CSRS  01/13/2024, 10:16 AM

## 2024-01-21 ENCOUNTER — Ambulatory Visit: Admitting: Physical Therapy

## 2024-01-21 ENCOUNTER — Ambulatory Visit: Admitting: Occupational Therapy

## 2024-01-21 VITALS — BP 143/87 | HR 57

## 2024-01-21 DIAGNOSIS — R2689 Other abnormalities of gait and mobility: Secondary | ICD-10-CM

## 2024-01-21 DIAGNOSIS — R208 Other disturbances of skin sensation: Secondary | ICD-10-CM

## 2024-01-21 DIAGNOSIS — M6281 Muscle weakness (generalized): Secondary | ICD-10-CM | POA: Diagnosis not present

## 2024-01-21 DIAGNOSIS — R278 Other lack of coordination: Secondary | ICD-10-CM

## 2024-01-21 DIAGNOSIS — R2681 Unsteadiness on feet: Secondary | ICD-10-CM

## 2024-01-21 NOTE — Therapy (Signed)
 OUTPATIENT PHYSICAL THERAPY NEURO TREATMENT   Patient Name: Mark Herrera MRN: [id number] DOB:[birth year], 63 y.o., male Today's Date: 01/21/2024   PCP: [facility] REFERRING PROVIDER: Jerri Pfeiffer, MD  END OF SESSION:  PT End of Session - 01/21/24 9147     Visit Number 2    Number of Visits 9   with eval   Date for PT Re-Evaluation 02/24/24    Authorization Type Medicaid    PT Start Time 0845    PT Stop Time 0930    PT Time Calculation (min) 45 min    Equipment Utilized During Treatment Gait belt    Activity Tolerance Patient tolerated treatment well    Behavior During Therapy WFL for tasks assessed/performed           Past Medical History:  Diagnosis Date   CAD (coronary artery disease)    10/08/16 STEMI DES x1 to pLAD EF 45%   Cocaine abuse (HCC)    CVA (cerebral vascular accident) (HCC)    Gout    Tobacco abuse    Past Surgical History:  Procedure Laterality Date   CORONARY STENT INTERVENTION N/A 10/08/2016   Procedure: Coronary Stent Intervention;  Surgeon: Verlin Lonni BIRCH, MD;  Location: [facility]VAS[facility];  Service: Cardiovascular;  Laterality: N/A;   IR CT HEAD LTD  05/31/2020   IR CT HEAD LTD  09/26/2023   IR CT HEAD LTD  12/23/2023   IR PERCUTANEOUS ART THROMBECTOMY/INFUSION INTRACRANIAL INC DIAG ANGIO  05/31/2020   IR PERCUTANEOUS ART THROMBECTOMY/INFUSION INTRACRANIAL INC DIAG ANGIO  09/26/2023   IR PERCUTANEOUS ART THROMBECTOMY/INFUSION INTRACRANIAL INC DIAG ANGIO  12/23/2023   IR US  GUIDE VASC ACCESS RIGHT  05/31/2020   IR US  GUIDE VASC ACCESS RIGHT  09/26/2023   LEFT HEART CATH AND CORONARY ANGIOGRAPHY N/A 10/08/2016   Procedure: Left Heart Cath and Coronary Angiography;  Surgeon: Verlin Lonni BIRCH, MD;  Location: [facility] INVASIVE CV LAB;  Service: Cardiovascular;  Laterality: N/A;   RADIOLOGY WITH ANESTHESIA N/A 05/31/2020   Procedure: IR WITH ANESTHESIA;  Surgeon: Radiologist, Medication, MD;  Location: [facility] OR;  Service:  Radiology;  Laterality: N/A;   RADIOLOGY WITH ANESTHESIA N/A 09/05/2021   Procedure: RADIOLOGY WITH ANESTHESIA;  Surgeon: Radiologist, Medication, MD;  Location: [facility] OR;  Service: Radiology;  Laterality: N/A;   RADIOLOGY WITH ANESTHESIA N/A 09/26/2023   Procedure: RADIOLOGY WITH ANESTHESIA;  Surgeon: Radiologist, Medication, MD;  Location: [facility];  Service: Radiology;  Laterality: N/A;   RADIOLOGY WITH ANESTHESIA N/A 12/23/2023   Procedure: RADIOLOGY WITH ANESTHESIA;  Surgeon: Dolphus Carrion, MD;  Location: [facility] OR;  Service: Radiology;  Laterality: N/A;   TOE SURGERY     Patient Active Problem List   Diagnosis Date Noted   Acute ischemic right MCA stroke (HCC) 12/23/2023   Acute ischemic right anterior cerebral artery (ACA) stroke (HCC) 12/23/2023   Acute ischemic stroke (HCC) 09/26/2023   Right middle cerebral artery stroke (HCC)    Bradycardia    Dyslipidemia    Polysubstance abuse (HCC)    CVA (cerebral vascular accident) (HCC) 09/09/2021   Status post stroke 05/31/2020   Stroke (cerebrum) (HCC) 05/31/2020   Essential hypertension 06/01/2019   Cocaine abuse (HCC) 06/01/2019   Stroke-like episode s/p tPA 05/31/2019   Hyperlipemia 10/11/2016   Tobacco abuse 10/11/2016   STEMI (ST elevation myocardial infarction) (HCC) 10/09/2016   Acute ST elevation myocardial infarction (STEMI) involving left anterior descending (LAD) coronary artery (HCC)     ONSET DATE:  12/25/2023 (referral date, CVA 12/23/23)  REFERRING DIAG: I63.9 (ICD-10-CM) - Acute CVA (cerebrovascular accident) (HCC)  THERAPY DIAG:  Other lack of coordination  Muscle weakness (generalized)  Unsteadiness on feet  Other abnormalities of gait and mobility  Rationale for Evaluation and Treatment: Rehabilitation  SUBJECTIVE:                                                                                                                                                                                             SUBJECTIVE  STATEMENT: Pt continues to stay busy at home, is probably over doing it. Pt denies any falls since last visit. Pt denies any pain today.  From Eval: Pt reports that he had a fall on 12/23/23 in his kitchen, he was not able to get back up by himself. His grandson came in and tried to help him up, he went to the hospital and found out he had a second stroke. He reports ongoing mild weakness on his L side as well as some memory and problem-solving impairments. 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 He has also noticed increased sensitivity in his L arm and L leg since the 2nd stroke. Pt also reports that when he gets up from sitting for a long time or gets up out of bed he is very stiff and stumbles, takes a little bit to loosen up his body.  Pt accompanied by: self  PERTINENT HISTORY: PMH is significant for multiple prior strokes w/ residual R hemiparesis and L sided deficits, CAD, and HLD.  Per Acute Care PT Note 12/24/2023: Pt is a 63 y.o. M presenting [facility]23 following a fall w/ sudden onset of L sided weakness. Pt admitted w/ R MCA stroke and is s/p R common carotid arteriogram. PMH is significant for multiple prior strokes w/ residual R hemiparesis and L sided deficits, CAD, and HLD.  PAIN:  Are you having pain? No, stiffness  PRECAUTIONS: Fall  RED FLAGS: None   WEIGHT BEARING RESTRICTIONS: No  FALLS: Has patient fallen in last 6 months? Yes. Number of falls 1 when he had the stroke, he was unable to get back up by himself and his grandson had to help him  LIVING ENVIRONMENT: Lives with: lives with their family, he lives with his daughter and son Lives in: House/apartment Stairs: Yes: External: 3 steps; can reach both Has following equipment at home: Walker - 2 wheeled and Shower bench, grab bars in shower  PLOF: Independent  PATIENT GOALS: to at least have 80% of my mobility back, I'm at a cool  65% right  now  OBJECTIVE:  Note: Objective measures were completed at Evaluation unless otherwise noted.  DIAGNOSTIC FINDINGS:   Brain MRI 12/24/2023 IMPRESSION: 1. Small focus of acute ischemia within the anterior right frontal white matter. 2. Old right MCA territory infarct with encephalomalacia, gliosis, and chronic blood products. 3. Multifocal hyperintense T2-weighted signal within the cerebral white matter, most commonly due to chronic small vessel disease.  COGNITION: Overall cognitive status: pt reports mild memory impairments, takes longer than normal for problem solving   SENSATION: Decreased on L side as compared to R side Hypersensitivity to vibration on his L side   COORDINATION: WFL in BLE  EDEMA:  None  POSTURE: rounded shoulders, forward head, and posterior pelvic tilt  LOWER EXTREMITY ROM:     Active  Right Eval Left Eval  Hip flexion Tight hip flexors Tight hip flexors  Hip extension    Hip abduction    Hip adduction    Hip internal rotation    Hip external rotation    Knee flexion    Knee extension Tight HS Tight HS  Ankle dorsiflexion Tight heel cords Tight heel cords  Ankle plantarflexion    Ankle inversion    Ankle eversion     (Blank rows = not tested)  LOWER EXTREMITY MMT:    MMT Right Eval Left Eval  Hip flexion 5 4  Hip extension    Hip abduction    Hip adduction    Hip internal rotation    Hip external rotation    Knee flexion 5 4  Knee extension 5 4  Ankle dorsiflexion 5 4  Ankle plantarflexion    Ankle inversion    Ankle eversion    (Blank rows = not tested)  BED MOBILITY:  Mod I per patient report  TRANSFERS: Sit to stand: Modified independence  Assistive device utilized: None     Stand to sit: Modified independence  Assistive device utilized: None     Chair to chair: Modified independence  Assistive device utilized: None       RAMP:  Not tested  CURB:  Not tested  STAIRS: Not tested GAIT: Findings:  Gait  pattern: very stiff movements, decreased hip/knee flexion- Right, decreased hip/knee flexion- Left, antalgic, and trunk flexed Distance walked: various clinic distances Assistive device utilized: None Level of assistance: Modified independence Comments: see above   FUNCTIONAL TESTS:                                                                                                                                  TREATMENT DATE:  Self-Care/Home Management  Vitals:   01/21/24 0856  BP: (!) 143/87  Pulse: (!) 57    Pt does not currently have a machine at home, his girlfriend has one and he is able to check it at her place. Encouraged him to reach out to his PCP about obtaining a BP cuff. Pt vitals slightly elevated  but within safe limits this session.  TherAct To address lower body tightness: Supine modified Thomas stretch 3 x 30 sec B Long sitting HS stretch 3 x 30 sec Long sitting gastroc stretch 3 x 30 sec B with gait belt  To address lower body weakness: Standing heel raises x 10 reps Standing toe raises x 10 reps  Added to HEP, see bolded below  Gait Gait pattern: knee flexed in stance- Right, knee flexed in stance- Left, trunk flexed, and poor foot clearance- Right Distance walked: 115 ft Assistive device utilized: None Level of assistance: Modified independence Comments: flexed trunk, flexed hips and knees, R foot catching occasionally; improved posture following stretching    PATIENT EDUCATION: Education details: initial HEP Person educated: Patient Education method: Medical illustrator Education comprehension: verbalized understanding, returned demonstration, and needs further education  HOME EXERCISE PROGRAM: Access Code: [email or web address] URL: [email or web address] Date: 01/21/2024 Prepared by: Waddell Southgate  Exercises - Modified Thomas Stretch  - 1 x daily - 7 x weekly - 1 sets - 3-5 reps - 30 sec hold - Long Sitting Hamstring Stretch   - 1 x daily - 7 x weekly - 1 sets - 3-5 reps - 30 sec hold - Long Sitting Calf Stretch with Strap  - 1 x daily - 7 x weekly - 1 sets - 3-5 reps - 30 sec hold - Heel Raises with Counter Support  - 1 x daily - 7 x weekly - 3 sets - 10 reps - Heel Toe Raises with Counter Support  - 1 x daily - 7 x weekly - 3 sets - 10 reps  GOALS: Goals reviewed with patient? Yes  SHORT TERM GOALS=LONG TERM GOALS due to length of POC   LONG TERM GOALS: Target date: 02/19/2024   Pt will be independent with final HEP for improved strength, balance, transfers and gait. Baseline:  Goal status: INITIAL  2.  Pt will improve gait velocity to at least 3.5 ft/sec for improved gait efficiency and performance at mod I level  Baseline: 3.24 ft/sec mod I (8/12) Goal status: INITIAL  3.  Pt will improve FGA to 23/30 for decreased fall risk  Baseline: 19/30 Goal status: INITIAL  4.  Pt will trial LRAD to determine if he could benefit from use of an AD for improved balance and decreased fall risk. Baseline: no AD at baseline Goal status: INITIAL   ASSESSMENT:  CLINICAL IMPRESSION: Emphasis of skilled PT session on trialing various stretches and strengthening exercises to address muscle weakness and tightness. He does exhibit improved posture following stretching this date. He continues to benefit from education regarding BP management, safety awareness, and continuing to work on improving his strength and his balance in order to improve his functional mobility. Continue POC.    OBJECTIVE IMPAIRMENTS: Abnormal gait, decreased balance, decreased cognition, decreased mobility, difficulty walking, decreased ROM, decreased strength, impaired perceived functional ability, impaired sensation, and postural dysfunction.   ACTIVITY LIMITATIONS: carrying, lifting, bending, sitting, standing, squatting, stairs, transfers, and bed mobility  PARTICIPATION LIMITATIONS: driving, community activity, and occupation  PERSONAL  FACTORS: Fitness and 3+ comorbidities:  multiple prior strokes w/ residual R hemiparesis and L sided deficits, CAD, and HLD.are also affecting patient's functional outcome.   REHAB POTENTIAL: Good  CLINICAL DECISION MAKING: Evolving/moderate complexity  EVALUATION COMPLEXITY: Moderate  PLAN:  PT FREQUENCY: 2x/week  PT DURATION: 4 weeks  PLANNED INTERVENTIONS: 97164- PT Re-evaluation, 97750- Physical Performance Testing, 97110-Therapeutic exercises, 97530- Therapeutic activity, [id number]- Neuromuscular re-education, 97535- Self Care, 02859-  Manual therapy, [id number]- Gait training, 02239- Orthotic Initial, [phone]- Orthotic/Prosthetic subsequent, [phone]- Electrical stimulation (manual), [phone] (1-2 muscles), 20561 (3+ muscles)- Dry Needling, Patient/Family education, Balance training, Stair training, Taping, Joint mobilization, Spinal mobilization, Cognitive remediation, DME instructions, Cryotherapy, and Moist heat  PLAN FOR NEXT SESSION: assess vitals, pt needs to get a BP machine for use at home-did he reach out to PCP?, how is initial HEP?  functional strengthening (sit to stands, step ups), dynamic balance (gait with head turns, stepping over obstacles, tandem gait), trial SPC?, prone? Quadruped?   Brookelynn Hamor, PT Waddell Southgate, PT, DPT, CSRS  01/21/2024, 9:31 AM

## 2024-01-21 NOTE — Therapy (Signed)
 OUTPATIENT OCCUPATIONAL THERAPY NEURO TREATMENT  Patient Name: Mark Herrera MRN: [id number] DOB:28-Nov-1960, 63 y.o., male Today's Date: 01/21/2024  PCP: [facility] REFERRING PROVIDER: Jerri Pfeiffer, MD  END OF SESSION:  OT End of Session - 01/21/24 0936     Visit Number 2    Number of Visits 9    Date for OT Re-Evaluation 03/02/24    Authorization Type [employer] - needs auth    OT Start Time 9065    OT Stop Time 1013    OT Time Calculation (min) 39 min    Activity Tolerance Patient tolerated treatment well    Behavior During Therapy [facility] for tasks assessed/performed          Past Medical History:  Diagnosis Date   CAD (coronary artery disease)    10/08/16 STEMI DES x1 to pLAD EF 45%   Cocaine abuse (HCC)    CVA (cerebral vascular accident) (HCC)    Gout    Tobacco abuse    Past Surgical History:  Procedure Laterality Date   CORONARY STENT INTERVENTION N/A 10/08/2016   Procedure: Coronary Stent Intervention;  Surgeon: Verlin Lonni BIRCH, MD;  Location: [facility] INVAS[facility] CV LAB;  Service: Cardiovascular;  Laterality: N/A;   IR CT HEAD LTD  05/31/2020   IR CT HEAD LTD  09/26/2023   IR CT HEAD LTD  12/23/2023   IR PERCUTANEOUS ART THROMBECTOMY/INFUSION INTRACRANIAL INC DIAG ANGIO  05/31/2020   IR PERCUTANEOUS ART THROMBECTOMY/INFUSION INTRACRANIAL INC DIAG ANGIO  09/26/2023   IR PERCUTANEOUS ART THROMBECTOMY/INFUSION INTRACRANIAL INC DIAG ANGIO  12/23/2023   IR US  GUIDE VASC ACCESS RIGHT  05/31/2020   IR US  GUIDE VASC ACCESS RIGHT  09/26/2023   LEFT HEART CATH AND CORONARY ANGIOGRAPHY N/A 10/08/2016   Procedure: Left Heart Cath and Coronary Angiography;  Surgeon: Verlin Lonni BIRCH, MD;  Location[facility] INVASIVE CV LAB;  Service: Cardiovascular;  Laterality: N/A;   RADIOLOGY WITH ANESTHESIA N/A 05/31/2020   Procedure: IR WITH ANESTHESIA;  Surgeon: Radiologist, Medication, MD;  Location: [facility] OR;  Service: Radiology;  Laterality: N/A;   RADIOLOGY WITH  ANESTHESIA N/A 09/05/2021   Procedure: RADIOLOGY WITH ANESTHESIA;  Surgeon: Radiologist, Medication, MD;  Location: [facility] OR;  Service: Radiology;  Laterality: N/A;   RADIOLOGY WITH ANESTHESIA N/A 09/26/2023   Procedure: RADIOLOGY WITH ANESTHESIA;  Surgeon: Radiologist, Medication, MD;  Location: [facility] OR;  Service: Radiology;  Laterality: N/A;   RADIOLOGY WITH ANESTHESIA N/A 12/23/2023   Procedure: RADIOLOGY WITH ANESTHESIA;  Surgeon: Dolphus Carrion, MD;  Location: [facility];  Service: Radiology;  Laterality: N/A;   TOE SURGERY     Patient Active Problem List   Diagnosis Date Noted   Acute ischemic right MCA stroke (HCC) 12/23/2023   Acute ischemic right anterior cerebral artery (ACA) stroke (HCC) 12/23/2023   Acute ischemic stroke (HCC) 09/26/2023   Right middle cerebral artery stroke (HCC)    Bradycardia    Dyslipidemia    Polysubstance abuse (HCC)    CVA (cerebral vascular accident) (HCC) 09/09/2021   Status post stroke 05/31/2020   Stroke (cerebrum) (HCC) 05/31/2020   Essential hypertension 06/01/2019   Cocaine abuse (HCC) 06/01/2019   Stroke-like episode s/p tPA 05/31/2019   Hyperlipemia 10/11/2016   Tobacco abuse 10/11/2016   STEMI (ST elevation myocardial infarction) (HCC) 10/09/2016   Acute ST elevation myocardial infarction (STEMI) involving left anterior descending (LAD) coronary artery (HCC)     ONSET DATE: 12/25/2023 (referral date)   REFERRING DIAG: I63.9 (ICD-10-CM) - Acute  CVA (cerebrovascular accident) (HCC)  THERAPY DIAG:  Other lack of coordination  Muscle weakness (generalized)  Other disturbances of skin sensation  Rationale for Evaluation and Treatment: Rehabilitation  SUBJECTIVE:   SUBJECTIVE STATEMENT: He feels that his LUE has really gotten stronger in the last couple weeks. He only sleeps 3-5 hours a night waking up multiple times during the night.  Pt accompanied by: self  PERTINENT HISTORY:  [facility]ing to [facility]23 following a fall w/ sudden  onset of L sided weakness. Pt admitted w/ R MCA stroke and is s/p R common carotid arteriogram. History of multiple prior strokes with residual right (2020 stroke) and left hemiparesis (2023 stroke), cocaine abuse, tobacco abuse, alcoholism, CAD and hyperlipidemia was admitted with sudden onset left-sided weakness which he noticed after a fall.  PRECAUTIONS: None  WEIGHT BEARING RESTRICTIONS: No  PAIN:  Are you having pain? Chronic Rt hip and leg pain  FALLS: Has patient fallen in last 6 months? Yes. 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  HAND DOMINANCE: Right  ADLs: Transfers/ambulation related to ADLs: Independent Eating: mod I  Grooming: mod I  UB Dressing: mod I  LB Dressing: mod I  Toileting: mod I  Bathing: mod I  Tub Shower transfers: mod I - tub/shower combo Equipment: Grab bars  IADLs: Shopping: independent  Light housekeeping: pt has returned to light housework  Meal Prep: return to light cooking but question safety - hypersensitivity Rt hand from previous stroke Community mobility: back to light driving  Medication management: independent  Financial management: independent  Handwriting: no changes  MOBILITY STATUS: Independent   FUNCTIONAL OUTCOME MEASURES: Upper Extremity Functional Scale (UEFS): 68/80 = 85% functioning (15% deficit)   UPPER EXTREMITY ROM:  RUE AROM WNL's LUE AROM WFL's w/ mild compensations at shoulder    UPPER EXTREMITY MMT:   RUE MMT grossly 4+/5, LUE MMT grossly  4/5   HAND FUNCTION: Grip strength: Right: 72.5 lbs; Left: 44 lbs  COORDINATION: 9 Hole Peg test: Right: 42.51 sec; Left: 57.13 sec  SENSATION: Pt could detect touch and localization in both hands separately however could not detect fingertips Lt when simultaneously stimulated with Rt. Pt also reports hypersensitivity with Rt hand especially with heat  EDEMA: none   COGNITION: Overall cognitive status: Within functional limits for tasks assessed  VISION: Subjective report: denies change  Baseline vision: Wears glasses for reading only   PERCEPTION: Not tested  PRAXIS: Not tested  OBSERVATIONS: pt pleasant, walks w/o device, main O.T.  deficit is [facility]  TREATMENT:   - Therapeutic exercises completed for duration as noted below including:  OT initiated LUE red theraputty exercises (search, grip, and pinch) as noted in patient instructions for coordination and strength OT provided pt with coordination handout as noted in pt instructions but did not go over exercises due to time.  - Self-care/home management completed for duration as noted below including:  OT educated pt on key sleep hygiene strategies as noted in pt instructions, including: Maintaining a consistent sleep/wake schedule Creating a quiet, dark, and cool sleep environment Avoiding screens and stimulants before bedtime Making sleep environment comfortable Managing food and fluid intake to reduce nighttime awakenings Getting regular physical activity/limiting naps Reducing bad sleep habits while incorporating better sleep habits Tips for relaxation When to seek additional help Discussed stroke-specific considerations (e.g., increased sleep needs, safety with nighttime mobility)   OT provided additional education with respect to health management including wearing cut resistant gloves and/or  compression sleeves for BUE given type of work he does and being on blood thinners. He was also encouraged to reduce the number of work hours to allow his body time to rest after TIA earlier this year and recent CVA. He was warned these are signs he is not managing his health effectively.   PATIENT EDUCATION: Education details: see above Person educated: Patient Education method: Programmer, multimedia, Demonstration, Verbal cues, and Handouts Education comprehension: verbalized understanding, returned demonstration, verbal cues required, and needs further education  HOME EXERCISE PROGRAM: 01/21/2024: putty and coordination HEPs; sleep hygiene  GOALS: Goals reviewed with patient? Yes  LONG TERM GOALS: Target date: 03/02/24  Independent with bilateral coordination HEP and Lt putty HEP  Baseline: Not yet addressed Goal status: IN PROGRESS  2.  Pt to verbalize understanding with sensory loss Lt hand and hypersensitivity Rt hand and task modifications and A/E to increase safety with ADLS Baseline: Not yet addressed Goal status: INITIAL  3.  Pt to improve bilateral coordination as evidenced by reducing speed on 9 hole peg test by 5 sec or more Baseline: Rt = 42.51 sec, Lt = 57.13 sec Goal status: INITIAL  4.  Improve LUE grip strength by 5 or more lbs for opening tight containers Baseline: 44 lbs Goal status: INITIAL  5.  UEFI scale to be 90% or higher  Baseline: 85% Goal status: INITIAL  ASSESSMENT:  CLINICAL IMPRESSION: Patient demonstrates good understanding of HEP though may require review for carryover as needed to progress towards goals. Patient may benefit from structured sleep hygiene education and behavioral strategies to promote rehabilitation and overall physical health following CVA.   SABRA  PERFORMANCE DEFICITS: in functional skills including ADLs, IADLs, coordination, dexterity, sensation, strength, Fine motor control, Gross motor control, mobility, body mechanics, decreased  knowledge of precautions, decreased knowledge of use of DME, and UE functional use.   IMPAIRMENTS: are limiting patient from ADLs, IADLs, work, and leisure.   CO-MORBIDITIES: may have co-morbidities  that affects occupational performance. Patient will benefit from skilled OT to address above impairments and improve overall function.  REHAB POTENTIAL: Good  PLAN:  OT FREQUENCY: 2x/week  OT DURATION: 4 weeks (plus evaluation)  PLANNED INTERVENTIONS: 97535 self care/ADL training, 02889 therapeutic exercise, 97530 therapeutic activity, 97112 neuromuscular re-education, 97140 manual therapy, 97035 ultrasound, 97018 paraffin, 02960 fluidotherapy, 97010 moist heat, passive range of motion, patient/family education, and DME and/or AE instructions  RECOMMENDED OTHER SERVICES: none at this time  CONSULTED AND AGREED WITH PLAN OF CARE: Patient  PLAN FOR NEXT SESSION: coordination HEP (bilateral -  has handout but did not go over) and review Lt putty HEP   For all possible CPT codes, reference the Planned Interventions line above.     Check all conditions that are expected to impact treatment: {Conditions expected to impact treatment:Neurological condition and/or seizures and Social determinants of health   If treatment provided at initial evaluation, no treatment charged due to lack of authorization.        Jocelyn CHRISTELLA Bottom, OT 01/21/2024, 9:37 AM

## 2024-01-21 NOTE — Patient Instructions (Addendum)
??   Sleep Hygiene Handout ?? What Is Sleep Hygiene? Sleep hygiene refers to healthy habits and practices that help improve the quality, duration, and consistency of your sleep. Good sleep hygiene supports mental, emotional, and physical health.  ? Tips for Better Sleep Hygiene 1. Stick to a Consistent Sleep Schedule Go to bed and wake up at the same time every day--even on weekends. Helps regulate your body's internal clock. 2. Create a Relaxing Bedtime Routine Try calming activities before bed (e.g., reading, gentle stretching, meditation). Avoid stressful conversations or work tasks right before sleep. Take a bath or shower. 3. Limit Exposure to Light at Night Dim the lights in the evening. Avoid screens (phones, TVs, computers) at least 1 hour before bed. Use blue light filters if necessary. 4. Make Your Sleep Environment Comfortable Keep your bedroom dark, quiet, and cool  Use blackout curtains, white noise machines, or earplugs if needed. Invest in a comfortable mattress and pillows. Change your bedsheets and make your bed Practice proper sleep positioning Incorporate smells that you enjoy/help you relax like lavender, peppermint, etc. (lotion, bath/beauty products, essential oils/diffuser) 5. Watch What You Eat & Drink Avoid large meals, caffeine, and alcohol close to bedtime. Try to finish eating and drinking fluids at least 2-3 hours before bed.   6. Get Regular Physical Activity Exercise during the day can help you fall asleep faster and enjoy deeper sleep. (complete therapy exercises, walking, go to the gym) Avoid intense workouts late in the evening. 7. Limit Naps Keep naps short (20-30 minutes). Avoid napping late in the afternoon or evening.  ?? Habits That Interfere with Sleep Using your bed for work, watching TV, or eating. Staying in bed when you can't fall asleep (if you're awake for 20+ minutes, get up and do something relaxing in dim light).  ??? Bonus Tips  for Relaxation Try breathing exercises, progressive muscle relaxation, or guided meditation. Apps like Calm, Headspace, or Insight Timer can be helpful.  ?? When to Seek Help Consider talking to a doctor or sleep specialist if: You regularly have trouble falling or staying asleep. You snore loudly or gasp for air during sleep. You feel very sleepy during the day despite adequate sleep.  After a brain injury, sleep becomes especially important for brain recovery and overall health. Here's what the experts recommend: General Recommendation: Most adults are advised to get 7-9 hours of sleep per night. Post-Brain Injury Needs: These individuals often need more sleep than they did before injury, especially during the early stages of recovery.

## 2024-01-27 ENCOUNTER — Ambulatory Visit: Admitting: Physical Therapy

## 2024-01-27 ENCOUNTER — Ambulatory Visit: Admitting: Occupational Therapy

## 2024-01-27 ENCOUNTER — Encounter: Payer: Self-pay | Admitting: Occupational Therapy

## 2024-01-27 VITALS — BP 115/69 | HR 61

## 2024-01-27 DIAGNOSIS — R208 Other disturbances of skin sensation: Secondary | ICD-10-CM

## 2024-01-27 DIAGNOSIS — M6281 Muscle weakness (generalized): Secondary | ICD-10-CM

## 2024-01-27 DIAGNOSIS — R2689 Other abnormalities of gait and mobility: Secondary | ICD-10-CM

## 2024-01-27 DIAGNOSIS — R278 Other lack of coordination: Secondary | ICD-10-CM

## 2024-01-27 DIAGNOSIS — R2681 Unsteadiness on feet: Secondary | ICD-10-CM
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
## 2024-01-27 NOTE — Patient Instructions (Signed)
  Safety considerations for loss of sensation:   Look at affected hand when using it!   Do NOT use affected arm for anything: sharp, hot, breakable, or too heavy  Always check temperature of water (for showering, washing dishes, etc) with UNaffected arm/extremity  Consider travel mugs w/ lids to transport hot liquids/coffee  Consider alternative options and/or adaptive equipment to make things safer (ex: hand chopper or cut resistant glove for chopping vegetables)   Avoid cold temperatures as well (wear glove in cold temperatures, get ice w/ unaffected extremity)  AVOID handling chemicals and machinery    Work gloves when working

## 2024-02-04 ENCOUNTER — Ambulatory Visit: Attending: Neurology | Admitting: Occupational Therapy

## 2024-02-04 ENCOUNTER — Ambulatory Visit: Admitting: Physical Therapy

## 2024-02-04 VITALS — BP 124/68 | HR 68

## 2024-02-04 DIAGNOSIS — R278 Other lack of coordination: Secondary | ICD-10-CM | POA: Diagnosis present

## 2024-02-04 DIAGNOSIS — R208 Other disturbances of skin sensation: Secondary | ICD-10-CM | POA: Insufficient documentation

## 2024-02-04 DIAGNOSIS — M6281 Muscle weakness (generalized): Secondary | ICD-10-CM

## 2024-02-04 DIAGNOSIS — R293 Abnormal posture: Secondary | ICD-10-CM | POA: Insufficient documentation

## 2024-02-04 DIAGNOSIS — R2689 Other abnormalities of gait and mobility: Secondary | ICD-10-CM | POA: Insufficient documentation

## 2024-02-04 DIAGNOSIS — R2681 Unsteadiness on feet: Secondary | ICD-10-CM | POA: Insufficient documentation

## 2024-02-04 NOTE — Therapy (Signed)
 OUTPATIENT PHYSICAL THERAPY NEURO TREATMENT   Patient Name: Mark Herrera MRN: [id number] DOB:01-27-1961, 63 y.o., male Today's Date: 02/04/2024   PCP: Mark Herrera REFERRING PROVIDER: Jerri Pfeiffer, MD  END OF SESSION:  PT End of Session - 02/04/24 0933     Visit Number 4    Number of Visits 9   with eval   Date for PT Re-Evaluation 02/24/24    Authorization Type Medicaid    Authorization Time Period Approved 16 visits 01/21/24-03/21/24    Authorization - Number of Visits 16    Progress Note Due on Visit 10    PT Start Time 0931    PT Stop Time 1015    PT Time Calculation (min) 44 min    Equipment Utilized During Treatment Gait belt    Activity Tolerance Patient tolerated treatment well    Behavior During Therapy WFL for tasks assessed/performed             Past Medical History:  Diagnosis Date   CAD (coronary artery disease)    10/08/16 STEMI DES x1 to pLAD EF 45%   Cocaine abuse (HCC)    CVA (cerebral vascular accident) (HCC)    Gout    Tobacco abuse    Past Surgical History:  Procedure Laterality Date   CORONARY STENT INTERVENTION N/A 10/08/2016   Procedure: Coronary Stent Intervention;  Surgeon: Mark Lonni BIRCH, MD;  Location: [facility] INVASIVE CV LAB;  Service: Cardiovascular;  Laterality: N/A;   IR CT HEAD LTD  05/31/2020   IR CT HEAD LTD  09/26/2023   IR CT HEAD LTD  12/23/2023   IR PERCUTANEOUS ART THROMBECTOMY/INFUSION INTRACRANIAL INC DIAG ANGIO  05/31/2020   IR PERCUTANEOUS ART THROMBECTOMY/INFUSION INTRACRANIAL INC DIAG ANGIO  09/26/2023   IR PERCUTANEOUS ART THROMBECTOMY/INFUSION INTRACRANIAL INC DIAG ANGIO  12/23/2023   IR US  GUIDE VASC ACCESS RIGHT  05/31/2020   IR US  GUIDE VASC ACCESS RIGHT  09/26/2023   LEFT HEART CATH AND CORONARY ANGIOGRAPHY N/A 10/08/2016   Procedure: Left Heart Cath and Coronary Angiography;  Surgeon: Mark Lonni BIRCH, MD;  Location: [facility] INVASIVE CV LAB;  Service: Cardiovascular;  Laterality: N/A;    RADIOLOGY WITH ANESTHESIA N/A 05/31/2020   Procedure: IR WITH ANESTHESIA;  Surgeon: Radiologist, Medication, MD;  Location: [facility] OR;  Service: Radiology;  Laterality: N/A;   RADIOLOGY WITH ANESTHESIA N/A 09/05/2021   Procedure: RADIOLOGY WITH ANESTHESIA;  Surgeon: Radiologist, Medication, MD;  Location: [facility] OR;  Service: Radiology;  Laterality: N/A;   RADIOLOGY WITH ANESTHESIA N/A 09/26/2023   Procedure: RADIOLOGY WITH ANESTHESIA;  Surgeon: Radiologist, Medication, MD;  Location: [facility] OR;  Service: Radiology;  Laterality: N/A;   RADIOLOGY WITH ANESTHESIA N/A 12/23/2023   Procedure: RADIOLOGY WITH ANESTHESIA;  Surgeon: Mark Carrion, MD;  Location: [facility] OR;  Service: Radiology;  Laterality: N/A;   TOE SURGERY     Patient Active Problem List   Diagnosis Date Noted   Acute ischemic right MCA stroke (HCC) 12/23/2023   Acute ischemic right anterior cerebral artery (ACA) stroke (HCC) 12/23/2023   Acute ischemic stroke (HCC) 09/26/2023   Right middle cerebral artery stroke (HCC)    Bradycardia    Dyslipidemia    Polysubstance abuse (HCC)    CVA (cerebral vascular accident) (HCC) 09/09/2021   Status post stroke 05/31/2020   Stroke (cerebrum) (HCC) 05/31/2020   Essential hypertension 06/01/2019   Cocaine abuse (HCC) 06/01/2019   Stroke-like episode s/p tPA 05/31/2019   Hyperlipemia 10/11/2016   Tobacco abuse 10/11/2016  STEMI (ST elevation myocardial infarction) (HCC) 10/09/2016   Acute ST elevation myocardial infarction (STEMI) involving left anterior descending (LAD) coronary artery (HCC)     ONSET DATE: 12/25/2023 (referral date, CVA 12/23/23)  REFERRING DIAG: I63.9 (ICD-10-CM) - Acute CVA (cerebrovascular accident) (HCC)  THERAPY DIAG:  Other lack of coordination  Muscle weakness (generalized)  Unsteadiness on feet  Abnormal posture  Rationale for Evaluation and Treatment: Rehabilitation  SUBJECTIVE:                                                                                                                                                                                              SUBJECTIVE STATEMENT: Mark Herrera   Pt presents without AD. States he is having pain today, did not do anything this weekend. States he is working on trying to rest. I am hard headed. Denies falls since he was last here.   I am really pissed off because I have a $200 bicycle that no one will let me ride. Pt reports his son has put a bike lock on his bike due to him riding it without a helmet    From Eval: Pt reports that he had a fall on 12/23/23 in his kitchen, he was not able to get back up by himself. His grandson came in and tried to help him up, he went to the hospital and found out he had a second stroke. He reports ongoing mild weakness on his L side as well as some memory and problem-solving impairments. 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 He has also noticed increased sensitivity in his L arm and L leg since the 2nd stroke. Pt also reports that when he gets up from sitting for a long time or gets up out of bed he is very stiff and stumbles, takes a little bit to loosen up his body.  Pt accompanied by: self  PERTINENT HISTORY: PMH is significant for multiple prior strokes w/ residual R hemiparesis and L sided deficits, CAD, and HLD.  Per Acute Care PT Note 12/24/2023: Pt is a 63 y.o. M presenting to [facility] on 12/23/23 following a fall w/ sudden onset of L sided weakness. Pt admitted w/ R MCA stroke and is s/p R common carotid arteriogram. PMH is significant for multiple prior strokes w/ residual R hemiparesis and L sided deficits, CAD, and HLD.  PAIN:  Are you having pain? No, stiffness  PRECAUTIONS: Fall  RED FLAGS: None   WEIGHT BEARING RESTRICTIONS: No  FALLS: Has patient fallen in last 6 months? Yes. Number of falls 1 when he  had the stroke, he was unable to get back up by himself and his grandson had to help  him  LIVING ENVIRONMENT: Lives with: lives with their family, he lives with his daughter and son Lives in: House/apartment Stairs: Yes: External: 3 steps; can reach both Has following equipment at home: Environmental consultant - 2 wheeled and Shower bench, grab bars in shower  PLOF: Independent  PATIENT GOALS: to at least have 80% of my mobility back, I'm at a cool 65% right now  OBJECTIVE:  Note: Objective measures were completed at Evaluation unless otherwise noted.  DIAGNOSTIC FINDINGS:   Brain MRI 12/24/2023 IMPRESSION: 1. Small focus of acute ischemia within the anterior right frontal white matter. 2. Old right MCA territory infarct with encephalomalacia, gliosis, and chronic blood products. 3. Multifocal hyperintense T2-weighted signal within the cerebral white matter, most commonly due to chronic small vessel disease.  COGNITION: Overall cognitive status: pt reports mild memory impairments, takes longer than normal for problem solving   SENSATION: Decreased on L side as compared to R side Hypersensitivity to vibration on his L side   COORDINATION: WFL in BLE  EDEMA:  None  POSTURE: rounded shoulders, forward head, and posterior pelvic tilt  LOWER EXTREMITY ROM:     Active  Right Eval Left Eval  Hip flexion Tight hip flexors Tight hip flexors  Hip extension    Hip abduction    Hip adduction    Hip internal rotation    Hip external rotation    Knee flexion    Knee extension Tight HS Tight HS  Ankle dorsiflexion Tight heel cords Tight heel cords  Ankle plantarflexion    Ankle inversion    Ankle eversion     (Blank rows = not tested)  LOWER EXTREMITY MMT:    MMT Right Eval Left Eval  Hip flexion 5 4  Hip extension    Hip abduction    Hip adduction    Hip internal rotation    Hip external rotation    Knee flexion 5 4  Knee extension 5 4  Ankle dorsiflexion 5 4  Ankle plantarflexion    Ankle inversion    Ankle eversion    (Blank rows = not tested)  BED  MOBILITY:  Mod I per patient report  TRANSFERS: Sit to stand: Modified independence  Assistive device utilized: None     Stand to sit: Modified independence  Assistive device utilized: None     Chair to chair: Modified independence  Assistive device utilized: None       RAMP:  Not tested  CURB:  Not tested  STAIRS: Not tested GAIT: Findings:  Gait pattern: very stiff movements, decreased hip/knee flexion- Right, decreased hip/knee flexion- Left, antalgic, and trunk flexed Distance walked: various clinic distances Assistive device utilized: None Level of assistance: Modified independence Comments: see above   FUNCTIONAL TESTS:      VITALS  Vitals:   02/04/24 0944  BP: 124/68  Pulse: 68  TREATMENT DATE:  Self-Care/Home Management  Assessed vitals (see above) and WNL Educated pt on importance of safety at home in order to promote independence and longevity. Pt states he is stubborn and hard headed and is told the same things from his family but is determined. Informed pt that his current actions will impact his future independence and mobility and it is crucial to focus on proper movement and safety now so he can be independent later. Pt verbalized understanding.    NMR  6 Blaze pods on random reach setting for improved postural control, truncal mobility, reciprocal coordination and proximal stability.  Performed on 2 minute intervals with 30s rest periods.  Pt requires SBA guarding. Round 1:  quadruped on mat  setup w/3 pods placed in vertical line to either side of pt. Cued pt to perform cross-body taps. 44 hits. Round 2:  same pod position w/pt in quadruped on rocker in L/R position (placed airex on rocker).  40 hits. Round 3:  same setup w/pods further apart.  35 hits. Notable errors/deficits:  Increased lean to R side when on rockerboard   From mat table, sit to stand w/alt fwd step and 8# slam ball, x10 reps per side, for improved stepping strategy, immediate standing balance, reactive balance and step clearance. Pt unable to stabilize well when stepping fwd w/RLE, frequently placing his L foot in line w/his R foot rather than leaving LLE posterior. Max cues to focus on only stepping w/one foot. Also cued pt to maintain sit to stand in between reps, as pt focusing on just throwing the ball.    Gait Gait pattern: knee flexed in stance- Right, knee flexed in stance- Left, trunk flexed, and poor foot clearance- Right Distance walked: Various clinic distances Assistive device utilized: None Level of assistance: Modified independence Comments: flexed trunk, flexed hips and knees, R foot catching occasionally; improved posture following blaze pod activity and pt reported feeling taller     PATIENT EDUCATION: Education details: continue HEP, see self-care above  Person educated: Patient Education method: Medical illustrator Education comprehension: verbalized understanding, returned demonstration, and needs further education  HOME EXERCISE PROGRAM: Access Code: [email or web address] URL: [email or web address] Date: 01/21/2024 Prepared by: Waddell Southgate  Exercises - Modified Thomas Stretch  - 1 x daily - 7 x weekly - 1 sets - 3-5 reps - 30 sec hold - Long Sitting Hamstring Stretch  - 1 x daily - 7 x weekly - 1 sets - 3-5 reps - 30 sec hold - Long Sitting Calf Stretch with Strap  - 1 x daily - 7 x weekly - 1 sets - 3-5 reps - 30 sec hold - Heel Raises with Counter Support  - 1 x daily - 7 x weekly - 3 sets - 10 reps - Heel Toe Raises with Counter Support  - 1 x daily - 7 x weekly - 3 sets - 10 reps  GOALS: Goals reviewed with patient? Yes  SHORT TERM GOALS=LONG TERM GOALS due to length of POC   LONG TERM GOALS: Target date: 02/19/2024   Pt will be independent with final HEP for improved strength, balance,  transfers and gait. Baseline:  Goal status: INITIAL  2.  Pt will improve gait velocity to at least 3.5 ft/sec for improved gait efficiency and performance at mod I level  Baseline: 3.24 ft/sec mod I (8/12) Goal status: INITIAL  3.  Pt will improve FGA to 23/30 for decreased fall risk  Baseline: 19/30 Goal status: INITIAL  4.  Pt  will trial LRAD to determine if he could benefit from use of an AD for improved balance and decreased fall risk. Baseline: no AD at baseline Goal status: INITIAL   ASSESSMENT:  CLINICAL IMPRESSION: Emphasis of skilled PT session on pt education regarding safety, improved postural control, reciprocal coordination and stepping strategy. Pt reports his son put a bike lock on his bike so he will not ride it and he plans on asking PCP for advice on a helmet at next appointment. Continue to inform pt he is not safe to ride a bike without a helmet. Pt very challenged by quadruped blaze pod activity and demonstrated weight shift to R side throughout. Pt also unable to stabilize on LLE well, resulting in frequent compensations to perform modified single leg tasks, requiring max cues for proper technique. Continue POC.    OBJECTIVE IMPAIRMENTS: Abnormal gait, decreased balance, decreased cognition, decreased mobility, difficulty walking, decreased ROM, decreased strength, impaired perceived functional ability, impaired sensation, and postural dysfunction.   ACTIVITY LIMITATIONS: carrying, lifting, bending, sitting, standing, squatting, stairs, transfers, and bed mobility  PARTICIPATION LIMITATIONS: driving, community activity, and occupation  PERSONAL FACTORS: Fitness and 3+ comorbidities:  multiple prior strokes w/ residual R hemiparesis and L sided deficits, CAD, and HLD.are also affecting patient's functional outcome.   REHAB POTENTIAL: Good  CLINICAL DECISION MAKING: Evolving/moderate complexity  EVALUATION COMPLEXITY: Moderate  PLAN:  PT FREQUENCY:  2x/week  PT DURATION: 4 weeks  PLANNED INTERVENTIONS: [id number]- PT Re-evaluation, 97750- Physical Performance Testing, 97110-Therapeutic exercises, 97530- Therapeutic activity, [id number]- Neuromuscular re-education, 97535- Self Care, [id number]- Manual therapy, [id number]- Gait training, [phone]- Orthotic Initial, [phone]- Orthotic/Prosthetic subsequent, [phone]- Electrical stimulation (manual), [phone] (1-2 muscles), [id number] (3+ muscles)- Dry Needling, Patient/Family education, Balance training, Stair training, Taping, Joint mobilization, Spinal mobilization, Cognitive remediation, DME instructions, Cryotherapy, and Moist heat  PLAN FOR NEXT SESSION: assess vitals, pt needs to get a BP machine for use at home-did he reach out to PCP?, how is initial HEP?  functional strengthening (sit to stands, step ups), dynamic balance (gait with head turns, stepping over obstacles, tandem gait), prone? Quadruped?, work on increasing step length and step height, prone I/Y/T, side step w/reach   Murel Shenberger E Wafa Martes, PT, DPT  02/04/2024, 10:19 AM
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
## 2024-02-09 ENCOUNTER — Telehealth: Payer: Self-pay | Admitting: Physical Therapy

## 2024-02-09 ENCOUNTER — Ambulatory Visit: Admitting: Occupational Therapy

## 2024-02-09 ENCOUNTER — Ambulatory Visit: Admitting: Physical Therapy

## 2024-02-09 NOTE — Telephone Encounter (Signed)
 Called pt and left VM regarding no-show to scheduled PT appointment. Reminded pt of next appointment date and time and provided clinic call-back number in case pt is unable to make appointment.   Rustin Erhart E Dwane Andres, PT, DPT

## 2024-02-12 ENCOUNTER — Ambulatory Visit: Admitting: Occupational Therapy

## 2024-02-12 ENCOUNTER — Ambulatory Visit: Admitting: Physical Therapy

## 2024-02-12 DIAGNOSIS — M6281 Muscle weakness (generalized): Secondary | ICD-10-CM

## 2024-02-12 DIAGNOSIS — R278 Other lack of coordination: Secondary | ICD-10-CM | POA: Diagnosis not present

## 2024-02-12 DIAGNOSIS — R293 Abnormal posture: Secondary | ICD-10-CM

## 2024-02-12 DIAGNOSIS — R208 Other disturbances of skin sensation: Secondary | ICD-10-CM

## 2024-02-12 NOTE — Therapy (Signed)
 OUTPATIENT OCCUPATIONAL THERAPY NEURO TREATMENT  Patient Name: Mark Herrera MRN: [id number] DOB:1960/08/11, 63 y.o., male Today's Date: 02/12/2024  PCP: [facility] REFERRING PROVIDER: Jerri Pfeiffer, MD  END OF SESSION:  OT End of Session - 02/12/24 1158     Visit Number 5    Number of Visits 9    Date for OT Re-Evaluation 03/02/24    Authorization Type [employer] - needs auth    OT Start Time 0930    OT Stop Time 1014    OT Time Calculation (min) 44 min    Activity Tolerance Patient tolerated treatment well    Behavior During Therapy [facility] for tasks assessed/performed           Past Medical History:  Diagnosis Date   CAD (coronary artery disease)    10/08/16 STEMI DES x1 to pLAD EF 45%   Cocaine abuse (HCC)    CVA (cerebral vascular accident) (HCC)    Gout    Tobacco abuse    Past Surgical History:  Procedure Laterality Date   CORONARY STENT INTERVENTION N/A 10/08/2016   Procedure: Coronary Stent Intervention;  Surgeon: Verlin Lonni BIRCH, MD;  [facility] LAB;  Service: Cardiovascular;  Laterality: N/A;   IR CT HEAD LTD  05/31/2020   IR CT HEAD LTD  09/26/2023   IR CT HEAD LTD  12/23/2023   IR PERCUTANEOUS ART THROMBECTOMY/INFUSION INTRACRANIAL INC DIAG ANGIO  05/31/2020   IR PERCUTANEOUS ART THROMBECTOMY/INFUSION INTRACRANIAL INC DIAG ANGIO  09/26/2023   IR PERCUTANEOUS ART THROMBECTOMY/INFUSION INTRACRANIAL INC DIAG ANGIO  12/23/2023   IR US  GUIDE VASC ACCESS RIGHT  05/31/2020   IR US  GUIDE VASC ACCESS RIGHT  09/26/2023   LEFT HEART CATH AND CORONARY ANGIOGRAPHY N/A 10/08/2016   Procedure: Left Heart Cath and Coronary Angiography;  Surgeon: Verlin Lonni BIRCH, MD;  Location: [facility] INVASIVE CV LAB;  Service: Cardiovascular;  Laterality: N/A;   RADIOLOGY WITH ANESTHESIA N/A 05/31/2020   Procedure: IR WITH ANESTHESIA;  Surgeon: Radiologist, Medication, MD;  [facility]: [facility] OR;  Service: Radiology;  Laterality: N/A;   RADIOLOGY WITH  ANESTHESIA N/A 09/05/2021   Procedure: RADIOLOGY WITH ANESTHESIA;  Surgeon: Radiologist, Medication, MD;  [facility]: [facility] OR;  Service: Radiology;  Laterality: N/A;   RADIOLOGY WITH ANESTHESIA N/A 09/26/2023   Procedure: RADIOLOGY WITH ANESTHESIA;  Surgeon: Radiologist, Medication, MD;  [facility]: [facility];  Service: Radiology;  Laterality: N/A;   RADIOLOGY WITH ANESTHESIA N/A 12/23/2023   Procedure: RADIOLOGY WITH ANESTHESIA;  Surgeon: Dolphus Carrion, MD;  [facility];  Service: Radiology;  Laterality: N/A;   TOE SURGERY     Patient Active Problem List   Diagnosis Date Noted   Acute ischemic right MCA stroke (HCC) 12/23/2023   Acute ischemic right anterior cerebral artery (ACA) stroke (HCC) 12/23/2023   Acute ischemic stroke (HCC) 09/26/2023   Right middle cerebral artery stroke (HCC)    Bradycardia    Dyslipidemia    Polysubstance abuse (HCC)    CVA (cerebral vascular accident) (HCC) 09/09/2021   Status post stroke 05/31/2020   Stroke (cerebrum) (HCC) 05/31/2020   Essential hypertension 06/01/2019   Cocaine abuse (HCC) 06/01/2019   Stroke-like episode s/p tPA 05/31/2019   Hyperlipemia 10/11/2016   Tobacco abuse 10/11/2016   STEMI (ST elevation myocardial infarction) (HCC) 10/09/2016   Acute ST elevation myocardial infarction (STEMI) involving left anterior descending (LAD) coronary artery (HCC)     ONSET DATE: 12/25/2023 (referral date)   REFERRING DIAG: I63.9 (ICD-10-CM) -  Acute CVA (cerebrovascular accident) (HCC)  THERAPY DIAG:  Other lack of coordination  Muscle weakness (generalized)  Abnormal posture  Other disturbances of skin sensation  Rationale for Evaluation and Treatment: Rehabilitation  SUBJECTIVE:   SUBJECTIVE STATEMENT: Pt states that he knows he is hard headed and trying to take it easy, not do risky activities.   Pt accompanied by: self  PERTINENT HISTORY:  presenting to [facility] on 12/23/23 following a fall w/ sudden onset of L sided weakness. Pt  admitted w/ R MCA stroke and is s/p R common carotid arteriogram. History of multiple prior strokes with residual right (2020 stroke) and left hemiparesis (2023 stroke), cocaine abuse, tobacco abuse, alcoholism, CAD and hyperlipidemia was admitted with sudden onset left-sided weakness which he noticed after a fall.  PRECAUTIONS: None  WEIGHT BEARING RESTRICTIONS: No  PAIN:  Are you having pain? Chronic Rt hip and leg pain  FALLS: Has patient fallen in last 6 months? Yes. 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  HAND DOMINANCE: Right  ADLs: Transfers/ambulation related to ADLs: Independent Eating: mod I  Grooming: mod I  UB Dressing: mod I  LB Dressing: mod I  Toileting: mod I  Bathing: mod I  Tub Shower transfers: mod I - tub/shower combo Equipment: Grab bars  IADLs: Shopping: independent  Light housekeeping: pt has returned to light housework  Meal Prep: return to light cooking but question safety - hypersensitivity Rt hand from previous stroke Community mobility: back to light driving  Medication management: independent  Financial management: independent  Handwriting: no changes  MOBILITY STATUS: Independent  FUNCTIONAL OUTCOME MEASURES: Upper Extremity Functional Scale (UEFS): 68/80 = 85% functioning (15% deficit)   UPPER EXTREMITY ROM:  RUE AROM WNL's LUE AROM WFL's w/ mild compensations at shoulder   UPPER EXTREMITY MMT:   RUE MMT grossly 4+/5, LUE MMT grossly 4/5  HAND FUNCTION: Grip  strength: Right: 72.5 lbs; Left: 44 lbs  COORDINATION: 9 Hole Peg test: Right: 42.51 sec; Left: 57.13 sec  SENSATION: Pt could detect touch and localization in both hands separately however could not detect fingertips Lt when simultaneously stimulated with Rt. Pt also reports hypersensitivity with Rt hand especially with heat  EDEMA: none  COGNITION: Overall cognitive status: Within functional limits for tasks assessed  VISION: Subjective report: denies change  Baseline vision: Wears glasses for reading only  PERCEPTION: Not tested  PRAXIS: Not tested  OBSERVATIONS: pt pleasant, walks w/o device, main O.T.  deficit is [facility]  TREATMENT:    Pt seen this date in clinic with no new complaints, focused on LUE FMC/GMC and NMR as well as simulating functional leisure activities with RUE. Pt education on work modification, safety techniques while cooking/leisure activities, compensation for sensory deficits. Focus on FMC/GMC with clothespin tree yellow - black with L hand in various heights to further improve functional use of L hand. 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 Pt in standing, holding 5# upgrading to 8# weight for PNF pattern and to simulate work activity as pt reports having UE weakness impacting ability to perform.    PATIENT EDUCATION: Education details: see above Person educated: Patient Education method: Programmer, multimedia, Demonstration, Verbal cues, and Handouts Education comprehension: verbalized understanding, returned demonstration, verbal cues required, and needs further education  HOME EXERCISE PROGRAM: 01/21/2024: putty and coordination HEPs; sleep hygiene 01/27/24: sensory loss considerations/safety, review of coordination HEP for  BUE's 02/04/2024: golf solitaire  GOALS: Goals reviewed with patient? Yes  LONG TERM GOALS: Target date: 03/02/24  Independent with bilateral coordination HEP and Lt putty HEP  Baseline: Not yet addressed Goal status: MET   2.  Pt to verbalize understanding with sensory loss Lt hand and hypersensitivity Rt hand and task modifications and A/E to increase safety with ADLS Baseline: Not yet addressed Goal status: MET  3.  Pt to improve bilateral coordination as evidenced by reducing speed on 9 hole peg test by 5 sec or more Baseline: Rt = 42.51 sec, Lt = 57.13 sec 02/04/2024: Rt = 31 sec, Lt = 37 sec Goal status: MET  4.  Improve LUE grip strength by 5 or more lbs for opening tight containers Baseline: 44 lbs 02/04/2024: 73.6 lbs Goal status: MET  5.  UEFI scale to be 90% or higher  Baseline: 85% Goal status: INITIAL  ASSESSMENT:  CLINICAL IMPRESSION: Patient demonstrates mild L side deficits from most recent CVA, and it an active participant in OT session. Note pt states he is hard headed and is aware that he needs to be safer and pace self for safety and injury prevention.  SABRA  PERFORMANCE DEFICITS: in functional skills including ADLs, IADLs, coordination, dexterity, sensation, strength, Fine motor control, Gross motor control, mobility, body mechanics, decreased knowledge of precautions, decreased knowledge of use of DME, and UE functional use.   IMPAIRMENTS: are limiting patient from ADLs, IADLs, work, and leisure.   CO-MORBIDITIES: may have co-morbidities  that affects occupational performance. Patient will benefit from skilled OT to address above impairments and improve overall function.  REHAB POTENTIAL: Good  PLAN:  OT FREQUENCY: 2x/week  OT DURATION: 4 weeks (plus evaluation)  PLANNED INTERVENTIONS: 97535 self care/ADL training, 02889 therapeutic exercise, 97530 therapeutic activity, 97112 neuromuscular re-education, 97140 manual therapy, 97035 ultrasound, 97018  paraffin, 02960 fluidotherapy, 97010 moist heat, passive range of motion, patient/family education, and DME and/or AE instructions  RECOMMENDED OTHER SERVICES: none at this time  CONSULTED AND AGREED WITH PLAN OF CARE: Patient  PLAN FOR NEXT SESSION: progress toward remaining goal; further discuss A/E recommendations for burn prevention; review golf solitaire as needed   For all possible CPT codes, reference the Planned Interventions line above.     Check all conditions that are expected to impact treatment: {Conditions expected to impact treatment:Neurological condition and/or seizures and Social determinants of health   If treatment provided at initial evaluation, no treatment charged due to lack of  authorization.        Chiquita JAYSON Hopping, OT 02/12/2024, 11:59 AM

## 2024-02-16 ENCOUNTER — Telehealth: Payer: Self-pay | Admitting: Physical Therapy

## 2024-02-16 ENCOUNTER — Ambulatory Visit: Admitting: Physical Therapy

## 2024-02-16 ENCOUNTER — Ambulatory Visit: Admitting: Occupational Therapy

## 2024-02-16 NOTE — Therapy (Incomplete)
 OUTPATIENT PHYSICAL THERAPY NEURO TREATMENT   Patient Name: Mark Herrera MRN: [id number] DOB:06/06/1960, 63 y.o., male Today's Date: 02/16/2024   PCP: [facility] REFERRING PROVIDER: Jerri Pfeiffer, MD  END OF SESSION:       Past Medical History:  Diagnosis Date   CAD (coronary artery disease)    10/08/16 STEMI DES x1 to pLAD EF 45%   Cocaine abuse (HCC)    CVA (cerebral vascular accident) (HCC)    Gout    Tobacco abuse    Past Surgical History:  Procedure Laterality Date   CORONARY STENT INTERVENTION N/A 10/08/2016   Procedure: Coronary Stent Intervention;  Surgeon: Verlin Lonni BIRCH, MD;  Location: [facility] INVASIVE CV LAB;  Service: Cardiovascular;  Laterality: N/A;   IR CT HEAD LTD  05/31/2020   IR CT HEAD LTD  09/26/2023   IR CT HEAD LTD  12/23/2023   IR PERCUTANEOUS ART THROMBECTOMY/INFUSION INTRACRANIAL INC DIAG ANGIO  05/31/2020   IR PERCUTANEOUS ART THROMBECTOMY/INFUSION INTRACRANIAL INC DIAG ANGIO  09/26/2023   IR PERCUTANEOUS ART THROMBECTOMY/INFUSION INTRACRANIAL INC DIAG ANGIO  12/23/2023   IR US  GUIDE VASC ACCESS RIGHT  05/31/2020   IR US  GUIDE VASC ACCESS RIGHT  09/26/2023   LEFT HEART CATH AND CORONARY ANGIOGRAPHY N/A 10/08/2016   Procedure: Left Heart Cath and Coronary Angiography;  Surgeon: Verlin Lonni BIRCH, MD;  Location: [facility] INVASIVE CV LAB;  Service: Cardiovascular;  Laterality: N/A;   RADIOLOGY WITH ANESTHESIA N/A 05/31/2020   Procedure: IR WITH ANESTHESIA;  Surgeon: Radiologist, Medication, MD;  Location: [facility] OR;  Service: Radiology;  Laterality: N/A;   RADIOLOGY WITH ANESTHESIA N/A 09/05/2021   Procedure: RADIOLOGY WITH ANESTHESIA;  Surgeon: Radiologist, Medication, MD;  Location: [facility] OR;  Service: Radiology;  Laterality: N/A;   RADIOLOGY WITH ANESTHESIA N/A 09/26/2023   Procedure: RADIOLOGY WITH ANESTHESIA;  Surgeon: Radiologist, Medication, MD;  Location: [facility] OR;  Service: Radiology;  Laterality: N/A;   RADIOLOGY WITH ANESTHESIA  N/A 12/23/2023   Procedure: RADIOLOGY WITH ANESTHESIA;  Surgeon: Dolphus Carrion, MD;  Location: [facility] OR;  Service: Radiology;  Laterality: N/A;   TOE SURGERY     Patient Active Problem List   Diagnosis Date Noted   Acute ischemic right MCA stroke (HCC) 12/23/2023   Acute ischemic right anterior cerebral artery (ACA) stroke (HCC) 12/23/2023   Acute ischemic stroke (HCC) 09/26/2023   Right middle cerebral artery stroke (HCC)    Bradycardia    Dyslipidemia    Polysubstance abuse (HCC)    CVA (cerebral vascular accident) (HCC) 09/09/2021   Status post stroke 05/31/2020   Stroke (cerebrum) (HCC) 05/31/2020   Essential hypertension 06/01/2019   Cocaine abuse (HCC) 06/01/2019   Stroke-like episode s/p tPA 05/31/2019   Hyperlipemia 10/11/2016   Tobacco abuse 10/11/2016   STEMI (ST elevation myocardial infarction) (HCC) 10/09/2016   Acute ST elevation myocardial infarction (STEMI) involving left anterior descending (LAD) coronary artery (HCC)     ONSET DATE: 12/25/2023 (referral date, CVA 12/23/23)  REFERRING DIAG: I63.9 (ICD-10-CM) - Acute CVA (cerebrovascular accident) (HCC)  THERAPY DIAG:  No diagnosis found.  Rationale for Evaluation and Treatment: Rehabilitation  SUBJECTIVE:  SUBJECTIVE STATEMENT: [name]   Pt presents without AD. States he is having pain today, did not do anything this weekend. States he is working on trying to rest. I am hard headed. Denies falls since he was last here.   I am really pissed off because I have a $200 bicycle that no one will let me ride. Pt reports his son has put a bike lock on his bike due to him riding it without a helmet   ***   From Eval: Pt reports that he had a fall on 12/23/23 in his kitchen, he was not able to get back up by himself. His  grandson came in and tried to help him up, he went to the hospital and found out he had a second stroke. He reports ongoing mild weakness on his L side as well as some memory and problem-solving impairments. 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 He has also noticed increased sensitivity in his L arm and L leg since the 2nd stroke. Pt also reports that when he gets up from sitting for a long time or gets up out of bed he is very stiff and stumbles, takes a little bit to loosen up his body.  Pt accompanied by: self  PERTINENT HISTORY: PMH is significant for multiple prior strokes w/ residual R hemiparesis and L sided deficits, CAD, and HLD.  Per Acute Care PT Note 12/24/2023: Pt is a 63 y.o. M presenting to [facility] on 12/23/23 following a fall w/ sudden onset of L sided weakness. Pt admitted w/ R MCA stroke and is s/p R common carotid arteriogram. PMH is significant for multiple prior strokes w/ residual R hemiparesis and L sided deficits, CAD, and HLD.  PAIN:  Are you having pain? No, stiffness  PRECAUTIONS: Fall  RED FLAGS: None   WEIGHT BEARING RESTRICTIONS: No  FALLS: Has patient fallen in last 6 months? Yes. Number of falls 1 when he had the stroke, he was unable to get back up by himself and his grandson had to help him  LIVING ENVIRONMENT: Lives with: lives with their family, he lives with his daughter and son Lives in: House/apartment Stairs: Yes: External: 3 steps; can reach both Has following equipment at home: Environmental consultant - 2 wheeled and Shower bench, grab bars in shower  PLOF: Independent  PATIENT GOALS: to at least have 80% of my mobility back, I'm at a cool 65% right now  OBJECTIVE:  Note: Objective measures were completed at Evaluation unless otherwise noted.  DIAGNOSTIC FINDINGS:   Brain MRI 12/24/2023 IMPRESSION: 1. Small focus of acute ischemia within the anterior right frontal white matter. 2. Old right MCA  territory infarct with encephalomalacia, gliosis, and chronic blood products. 3. Multifocal hyperintense T2-weighted signal within the cerebral white matter, most commonly due to chronic small vessel disease.  COGNITION: Overall cognitive status: pt reports mild memory impairments, takes longer than normal for problem solving   SENSATION: Decreased on L side as compared to R side Hypersensitivity to vibration on his L side   COORDINATION: WFL in BLE  EDEMA:  None  POSTURE: rounded shoulders, forward head, and posterior pelvic tilt  LOWER EXTREMITY ROM:     Active  Right Eval Left Eval  Hip flexion Tight hip flexors Tight hip flexors  Hip extension    Hip abduction    Hip adduction    Hip internal rotation    Hip external rotation    Knee  flexion    Knee extension Tight HS Tight HS  Ankle dorsiflexion Tight heel cords Tight heel cords  Ankle plantarflexion    Ankle inversion    Ankle eversion     (Blank rows = not tested)  LOWER EXTREMITY MMT:    MMT Right Eval Left Eval  Hip flexion 5 4  Hip extension    Hip abduction    Hip adduction    Hip internal rotation    Hip external rotation    Knee flexion 5 4  Knee extension 5 4  Ankle dorsiflexion 5 4  Ankle plantarflexion    Ankle inversion    Ankle eversion    (Blank rows = not tested)  BED MOBILITY:  Mod I per patient report  TRANSFERS: Sit to stand: Modified independence  Assistive device utilized: None     Stand to sit: Modified independence  Assistive device utilized: None     Chair to chair: Modified independence  Assistive device utilized: None       RAMP:  Not tested  CURB:  Not tested  STAIRS: Not tested GAIT: Findings:  Gait pattern: very stiff movements, decreased hip/knee flexion- Right, decreased hip/knee flexion- Left, antalgic, and trunk flexed Distance walked: various clinic distances Assistive device utilized: None Level of assistance: Modified independence Comments: see  above   FUNCTIONAL TESTS:      VITALS  There were no vitals filed for this visit.                                                                                                                               TREATMENT DATE:  Self-Care/Home Management  Assessed vitals (see above) and WNL Educated pt on importance of safety at home in order to promote independence and longevity. Pt states he is stubborn and hard headed and is told the same things from his family but is determined. Informed pt that his current actions will impact his future independence and mobility and it is crucial to focus on proper movement and safety now so he can be independent later. Pt verbalized understanding.    NMR  6 Blaze pods on random reach setting for improved postural control, truncal mobility, reciprocal coordination and proximal stability.  Performed on 2 minute intervals with 30s rest periods.  Pt requires SBA guarding. Round 1:  quadruped on mat  setup w/3 pods placed in vertical line to either side of pt. Cued pt to perform cross-body taps. 44 hits. Round 2:  same pod position w/pt in quadruped on rocker in L/R position (placed airex on rocker).  40 hits. Round 3:  same setup w/pods further apart.  35 hits. Notable errors/deficits:  Increased lean to R side when on rockerboard  From mat table, sit to stand w/alt fwd step and 8# slam ball, x10 reps per side, for improved stepping strategy, immediate standing balance, reactive balance and step clearance. Pt unable to stabilize well when stepping fwd w/RLE,  frequently placing his L foot in line w/his R foot rather than leaving LLE posterior. Max cues to focus on only stepping w/one foot. Also cued pt to maintain sit to stand in between reps, as pt focusing on just throwing the ball.  ***   Gait Gait pattern: knee flexed in stance- Right, knee flexed in stance- Left, trunk flexed, and poor foot clearance- Right Distance walked: Various clinic  distances Assistive device utilized: None Level of assistance: Modified independence Comments: flexed trunk, flexed hips and knees, R foot catching occasionally; improved posture following blaze pod activity and pt reported feeling taller  ***   PATIENT EDUCATION: Education details: continue HEP, see self-care above *** Person educated: Patient Education method: Medical illustrator Education comprehension: verbalized understanding, returned demonstration, and needs further education  HOME EXERCISE PROGRAM: Access Code: [email or web address] URL: [email or web address] Date: 01/21/2024 Prepared by: Waddell Southgate  Exercises - Modified Thomas Stretch  - 1 x daily - 7 x weekly - 1 sets - 3-5 reps - 30 sec hold - Long Sitting Hamstring Stretch  - 1 x daily - 7 x weekly - 1 sets - 3-5 reps - 30 sec hold - Long Sitting Calf Stretch with Strap  - 1 x daily - 7 x weekly - 1 sets - 3-5 reps - 30 sec hold - Heel Raises with Counter Support  - 1 x daily - 7 x weekly - 3 sets - 10 reps - Heel Toe Raises with Counter Support  - 1 x daily - 7 x weekly - 3 sets - 10 reps  GOALS: Goals reviewed with patient? Yes  SHORT TERM GOALS=LONG TERM GOALS due to length of POC   LONG TERM GOALS: Target date: 02/19/2024   Pt will be independent with final HEP for improved strength, balance, transfers and gait. Baseline:  Goal status: INITIAL  2.  Pt will improve gait velocity to at least 3.5 ft/sec for improved gait efficiency and performance at mod I level  Baseline: 3.24 ft/sec mod I (8/12) Goal status: INITIAL  3.  Pt will improve FGA to 23/30 for decreased fall risk  Baseline: 19/30 Goal status: INITIAL  4.  Pt will trial LRAD to determine if he could benefit from use of an AD for improved balance and decreased fall risk. Baseline: no AD at baseline Goal status: INITIAL   ASSESSMENT:  CLINICAL IMPRESSION: Emphasis of skilled PT session on*** pt education regarding safety,  improved postural control, reciprocal coordination and stepping strategy. Pt reports his son put a bike lock on his bike so he will not ride it and he plans on asking PCP for advice on a helmet at next appointment. Continue to inform pt he is not safe to ride a bike without a helmet. Pt very challenged by quadruped blaze pod activity and demonstrated weight shift to R side throughout. Pt also unable to stabilize on LLE well, resulting in frequent compensations to perform modified single leg tasks, requiring max cues for proper technique. Continue POC.    OBJECTIVE IMPAIRMENTS: Abnormal gait, decreased balance, decreased cognition, decreased mobility, difficulty walking, decreased ROM, decreased strength, impaired perceived functional ability, impaired sensation, and postural dysfunction.   ACTIVITY LIMITATIONS: carrying, lifting, bending, sitting, standing, squatting, stairs, transfers, and bed mobility  PARTICIPATION LIMITATIONS: driving, community activity, and occupation  PERSONAL FACTORS: Fitness and 3+ comorbidities:  multiple prior strokes w/ residual R hemiparesis and L sided deficits, CAD, and HLD.are also affecting patient's functional outcome.   REHAB POTENTIAL: Good  CLINICAL DECISION  MAKING: Evolving/moderate complexity  EVALUATION COMPLEXITY: Moderate  PLAN:  PT FREQUENCY: 2x/week  PT DURATION: 4 weeks  PLANNED INTERVENTIONS: 97164- PT Re-evaluation, 97750- Physical Performance Testing, 97110-Therapeutic exercises, 97530- Therapeutic activity, [id number]- Neuromuscular re-education, 97535- Self Care, [id number]- Manual therapy, [id number]- Gait training, [phone]- Orthotic Initial, [phone]- Orthotic/Prosthetic subsequent, [phone]- Electrical stimulation (manual), [phone] (1-2 muscles), [id number] (3+ muscles)- Dry Needling, Patient/Family education, Balance training, Stair training, Taping, Joint mobilization, Spinal mobilization, Cognitive remediation, DME instructions, Cryotherapy, and Moist heat  PLAN  FOR NEXT SESSION: assess vitals, pt needs to get a BP machine for use at home-did he reach out to PCP?, how is initial HEP?  functional strengthening (sit to stands, step ups), dynamic balance (gait with head turns, stepping over obstacles, tandem gait), prone? Quadruped?, work on increasing step length and step height, prone I/Y/T, side step w/reach***   Lativia Velie, PT Waddell Southgate, PT, DPT, CSRS   02/16/2024, 7:27 AM

## 2024-02-19 ENCOUNTER — Ambulatory Visit

## 2024-02-19 ENCOUNTER — Ambulatory Visit: Admitting: Physical Therapy

## 2024-02-19 VITALS — BP 127/82 | HR 59

## 2024-02-19 DIAGNOSIS — R2681 Unsteadiness on feet: Secondary | ICD-10-CM

## 2024-02-19 DIAGNOSIS — R278 Other lack of coordination: Secondary | ICD-10-CM

## 2024-02-19 DIAGNOSIS — M6281 Muscle weakness (generalized): Secondary | ICD-10-CM

## 2024-02-19 DIAGNOSIS — R2689 Other abnormalities of gait and mobility: Secondary | ICD-10-CM

## 2024-02-19 DIAGNOSIS — R293 Abnormal posture: Secondary | ICD-10-CM

## 2024-02-19 NOTE — Therapy (Signed)
 OUTPATIENT OCCUPATIONAL THERAPY NEURO TREATMENT  Patient Name: Mark Herrera MRN: [id number] DOB:04/29/1961, 63 y.o., male Today's Date: 02/19/2024  PCP: [facility] REFERRING PROVIDER: Jerri Pfeiffer, MD  OCCUPATIONAL THERAPY DISCHARGE SUMMARY  Visits from Start of Care: 6  Current functional level related to goals / functional outcomes: See below   Remaining deficits: Sensory loss in L hand Balance Mild FM coordination L Foot dragging    Education / Equipment: See below at Education/HEP  Patient agrees to discharge. Patient goals were partially met. Patient is being discharged due to being pleased with the current functional level..     END OF SESSION:  OT End of Session - 02/19/24 0935     Visit Number 6    Number of Visits 9    Date for OT Re-Evaluation 03/02/24    Authorization Type [employer] - needs auth    OT Start Time 0929    OT Stop Time 1013    OT Time Calculation (min) 44 min    Activity Tolerance Patient tolerated treatment well    Behavior During Therapy [facility] for tasks assessed/performed           Past Medical History:  Diagnosis Date   CAD (coronary artery disease)    10/08/16 STEMI DES x1 to pLAD EF 45%   Cocaine abuse (HCC)    CVA (cerebral vascular accident) (HCC)    Gout    Tobacco abuse    Past Surgical History:  Procedure Laterality Date   CORONARY STENT INTERVENTION N/A 10/08/2016   Procedure: Coronary Stent Intervention;  Surgeon: Verlin Lonni BIRCH, MD;  Location: [facility] INVASIVE CV LAB;  Service: Cardiovascular;  Laterality: N/A;   IR CT HEAD LTD  05/31/2020   IR CT HEAD LTD  09/26/2023   IR CT HEAD LTD  12/23/2023   IR PERCUTANEOUS ART THROMBECTOMY/INFUSION INTRACRANIAL INC DIAG ANGIO  05/31/2020   IR PERCUTANEOUS ART THROMBECTOMY/INFUSION INTRACRANIAL INC DIAG ANGIO  09/26/2023   IR PERCUTANEOUS ART THROMBECTOMY/INFUSION INTRACRANIAL INC DIAG ANGIO  12/23/2023   IR US  GUIDE VASC ACCESS RIGHT  05/31/2020    IR US  GUIDE VASC ACCESS RIGHT  09/26/2023   LEFT HEART CATH AND CORONARY ANGIOGRAPHY N/A 10/08/2016   Procedure: Left Heart Cath and Coronary Angiography;  Surgeon: Verlin Lonni BIRCH, MD;  Location: [facility] INVASIVE CV LAB;  Service: Cardiovascular;  Laterality: N/A;   RADIOLOGY WITH ANESTHESIA N/A 05/31/2020   Procedure: IR WITH ANESTHESIA;  Surgeon: Radiologist, Medication, MD;  Location: [facility] OR;  Service: Radiology;  Laterality: N/A;   RADIOLOGY WITH ANESTHESIA N/A 09/05/2021   Procedure: RADIOLOGY WITH ANESTHESIA;  Surgeon: Radiologist, Medication, MD;  Location: [facility] OR;  Service: Radiology;  Laterality: N/A;   RADIOLOGY WITH ANESTHESIA N/A 09/26/2023   Procedure: RADIOLOGY WITH ANESTHESIA;  Surgeon: Radiologist, Medication, MD;  Location: [facility] OR;  Service: Radiology;  Laterality: N/A;   RADIOLOGY WITH ANESTHESIA N/A 12/23/2023   Procedure: RADIOLOGY WITH ANESTHESIA;  Surgeon: Dolphus Carrion, MD;  Location: [facility] OR;  Service: Radiology;  Laterality: N/A;   TOE SURGERY     Patient Active Problem List   Diagnosis Date Noted   Acute ischemic right MCA stroke (HCC) 12/23/2023   Acute ischemic right anterior cerebral artery (ACA) stroke (HCC) 12/23/2023   Acute ischemic stroke (HCC) 09/26/2023   Right middle cerebral artery stroke (HCC)    Bradycardia    Dyslipidemia    Polysubstance abuse (HCC)    CVA (cerebral vascular accident) (HCC) 09/09/2021   Status post  stroke 05/31/2020   Stroke (cerebrum) (HCC) 05/31/2020   Essential hypertension 06/01/2019   Cocaine abuse (HCC) 06/01/2019   Stroke-like episode s/p tPA 05/31/2019   Hyperlipemia 10/11/2016   Tobacco abuse 10/11/2016   STEMI (ST elevation myocardial infarction) (HCC) 10/09/2016   Acute ST elevation myocardial infarction (STEMI) involving left anterior descending (LAD) coronary artery (HCC)     ONSET DATE: 12/25/2023 (referral date)   REFERRING DIAG: I63.9 (ICD-10-CM) - Acute CVA (cerebrovascular accident) (HCC)  THERAPY DIAG:   Other lack of coordination  Rationale for Evaluation and Treatment: Rehabilitation  SUBJECTIVE:   SUBJECTIVE STATEMENT: Pt states that he knows he is hard headed and trying to take it easy, not do risky activities.   Pt accompanied by: self  PERTINENT HISTORY:  presenting to [facility] on 12/23/23 following a fall w/ sudden onset of L sided weakness. Pt admitted w/ R MCA stroke and is s/p R common carotid arteriogram. History of multiple prior strokes with residual right (2020 stroke) and left hemiparesis (2023 stroke), cocaine abuse, tobacco abuse, alcoholism, CAD and hyperlipidemia was admitted with sudden onset left-sided weakness which he noticed after a fall.  PRECAUTIONS: None  WEIGHT BEARING RESTRICTIONS: No  PAIN:  Are you having pain? Chronic Rt hip and leg pain  FALLS: Has patient fallen in last 6 months? Yes. 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  HAND DOMINANCE: Right  ADLs: Transfers/ambulation related to ADLs: Independent Eating: mod I  Grooming: mod I  UB Dressing: mod I  LB Dressing: mod I  Toileting: mod I  Bathing: mod I  Tub Shower transfers: mod I - tub/shower combo Equipment: Grab bars  IADLs: Shopping: independent  Light housekeeping: pt has returned to light housework  Meal Prep: return to light cooking but question safety - hypersensitivity Rt hand from previous stroke Community mobility: back to light driving  Medication management: independent  Financial management:  independent  Handwriting: no changes  MOBILITY STATUS: Independent  FUNCTIONAL OUTCOME MEASURES: Upper Extremity Functional Scale (UEFS): 68/80 = 85% functioning (15% deficit)   UPPER EXTREMITY ROM:  RUE AROM WNL's LUE AROM WFL's w/ mild compensations at shoulder   UPPER EXTREMITY MMT:   RUE MMT grossly 4+/5, LUE MMT grossly 4/5  HAND FUNCTION: Grip strength: Right: 72.5 lbs; Left: 44 lbs  COORDINATION: 9 Hole Peg test: Right: 42.51 sec; Left: 57.13 sec  SENSATION: Pt could detect touch and localization in both hands separately however could not detect fingertips Lt when simultaneously stimulated with Rt. Pt also reports hypersensitivity with Rt hand especially with heat  EDEMA: none  COGNITION: Overall cognitive status: Within functional limits for tasks assessed  VISION: Subjective report: denies change  Baseline vision: Wears glasses for reading only  PERCEPTION: Not tested  PRAXIS: Not tested  OBSERVATIONS: pt pleasant, walks w/o device, main O.T.  deficit is [facility]  TREATMENT:    Pt seen this date in clinic with no new complaints, no c/o pain or falls.   Assessed UEFI, see below for further details. Assessed L grip strength with improvement noted, see below.   Pt was provided with list of recommended AE for safety with cooking d/t sensory loss in L hand. Also recommended family be present in the home for initial completion of cooking until he is consistently safe.   Pt engaged in simulated laundry tasks to ensure safety with functional mobility in the home, demonstrated good dynamic standing balance and ambulated with laundry basket with no c/o fatigue or LOB.   Completed 8 minutes of UE bike exercise at level 3 resistance to promote good UE strength and activity tolerance. Pt in agreement with discharge this date.   PATIENT EDUCATION: Education  details: see above Person educated: Patient Education method: Programmer, multimedia, Demonstration, Verbal cues, and Handouts Education comprehension: verbalized understanding, returned demonstration, verbal cues required, and needs further education  HOME EXERCISE PROGRAM: 01/21/2024: putty and coordination HEPs; sleep hygiene 01/27/24: sensory loss considerations/safety, review of coordination HEP for BUE's 02/04/2024: golf solitaire  GOALS: Goals reviewed with patient? Yes  LONG TERM GOALS: Target date: 03/02/24  Independent with bilateral coordination HEP and Lt putty HEP  Baseline: Not yet addressed Goal status: MET   2.  Pt to verbalize understanding with sensory loss Lt hand and hypersensitivity Rt hand and task modifications and A/E to increase safety with ADLS Baseline: Not yet addressed Goal status: MET  3.  Pt to improve bilateral coordination as evidenced by reducing speed on 9 hole peg test by 5 sec or more Baseline: Rt = 42.51 sec, Lt = 57.13 sec 02/04/2024: Rt = 31 sec, Lt = 37 sec Goal status: MET  4.  Improve LUE grip strength by 5 or more lbs for opening tight containers Baseline: 44 lbs 02/04/2024: 73.6 lbs, 02/19/24 = 81.3 lbs Goal status: MET   5.  UEFI scale to be 90% or higher  Baseline: 85% Goal status: NOT MET (02/19/24: 73.75%)   ASSESSMENT:  CLINICAL IMPRESSION: Patient has met 4/5 LTGs. Pt has improved in grip strength and coordination, as well as balance. Pt continues to have sensory loss and mild FM deficits and L foot dragging during functional mobility.   PERFORMANCE DEFICITS: in functional skills including ADLs, IADLs, coordination, dexterity, sensation, strength, Fine motor control, Gross motor control, mobility, body mechanics, decreased knowledge of precautions, decreased knowledge of use of DME, and UE functional use.   IMPAIRMENTS: are limiting patient from ADLs, IADLs, work, and leisure.   CO-MORBIDITIES: may have co-morbidities  that affects  occupational performance. Patient will benefit from skilled OT to address above impairments and improve overall function.  REHAB POTENTIAL: Good  PLAN:  OT FREQUENCY: 2x/week  OT DURATION: 4 weeks (plus evaluation)  PLANNED INTERVENTIONS: 97535 self care/ADL training, 02889 therapeutic exercise, 97530 therapeutic activity, 97112 neuromuscular re-education, 97140 manual therapy, 97035 ultrasound, 97018 paraffin, 02960 fluidotherapy, 97010 moist heat, passive range of motion, patient/family education, and DME and/or AE instructions  RECOMMENDED OTHER SERVICES: none at this time  CONSULTED AND AGREED WITH PLAN OF CARE: Patient  PLAN: D/C OT  For all possible CPT codes, reference the Planned Interventions line above.     Check all conditions that are expected to impact treatment: {Conditions expected to impact treatment:Neurological condition and/or seizures and Social determinants of health   If treatment provided at initial evaluation, no treatment charged due to lack of authorization.  Rocky Dutch, OT 02/19/2024, 10:01 AM

## 2024-02-19 NOTE — Therapy (Signed)
 OUTPATIENT PHYSICAL THERAPY NEURO TREATMENT - RECERTIFICATION   Patient Name: Mark Herrera MRN: [id number] DOB:10-Sep-1960, 63 y.o., male Today's Date: 02/19/2024   PCP: [facility] REFERRING PROVIDER: Jerri Pfeiffer, MD  END OF SESSION:  PT End of Session - 02/19/24 1019     Visit Number 5    Number of Visits 16   per Medicaid approval, recert   Date for Recertification  03/21/24   per Medicaid approval, recert   Authorization Type Medicaid    Authorization Time Period Approved 16 visits 01/21/24-03/21/24    Authorization - Visit Number 5    Authorization - Number of Visits 16    Progress Note Due on Visit 10    PT Start Time 1018   from OT session then in restroom   PT Stop Time 1100    PT Time Calculation (min) 42 min    Equipment Utilized During Treatment Gait belt    Activity Tolerance Patient tolerated treatment well    Behavior During Therapy [facility] for tasks assessed/performed              Past Medical History:  Diagnosis Date   CAD (coronary artery disease)    10/08/16 STEMI DES x1 to pLAD EF 45%   Cocaine abuse (HCC)    CVA (cerebral vascular accident) (HCC)    Gout    Tobacco abuse    Past Surgical History:  Procedure Laterality Date   CORONARY STENT INTERVENTION N/A 10/08/2016   Procedure: Coronary Stent Intervention;  Surgeon: Verlin Lonni BIRCH, MD;  Location: [facility] INVASIVE CV LAB;  Service: Cardiovascular;  Laterality: N/A;   IR CT HEAD LTD  05/31/2020   IR CT HEAD LTD  09/26/2023   IR CT HEAD LTD  12/23/2023   IR PERCUTANEOUS ART THROMBECTOMY/INFUSION INTRACRANIAL INC DIAG ANGIO  05/31/2020   IR PERCUTANEOUS ART THROMBECTOMY/INFUSION INTRACRANIAL INC DIAG ANGIO  09/26/2023   IR PERCUTANEOUS ART THROMBECTOMY/INFUSION INTRACRANIAL INC DIAG ANGIO  12/23/2023   IR US  GUIDE VASC ACCESS RIGHT  05/31/2020   IR US  GUIDE VASC ACCESS RIGHT  09/26/2023   LEFT HEART CATH AND CORONARY ANGIOGRAPHY N/A 10/08/2016   Procedure: Left Heart Cath and  Coronary Angiography;  Surgeon: Verlin Lonni BIRCH, MD;  Location: [facility] INVASIVE CV LAB;  Service: Cardiovascular;  Laterality: N/A;   RADIOLOGY WITH ANESTHESIA N/A 05/31/2020   Procedure: IR WITH ANESTHESIA;  Surgeon: Radiologist, Medication, MD;  Location: [facility] OR;  Service: Radiology;  Laterality: N/A;   RADIOLOGY WITH ANESTHESIA N/A 09/05/2021   Procedure: RADIOLOGY WITH ANESTHESIA;  Surgeon: Radiologist, Medication, MD;  Location: [facility] OR;  Service: Radiology;  Laterality: N/A;   RADIOLOGY WITH ANESTHESIA N/A 09/26/2023   Procedure: RADIOLOGY WITH ANESTHESIA;  Surgeon: Radiologist, Medication, MD;  Location: [facility] OR;  Service: Radiology;  Laterality: N/A;   RADIOLOGY WITH ANESTHESIA N/A 12/23/2023   Procedure: RADIOLOGY WITH ANESTHESIA;  Surgeon: Dolphus Carrion, MD;  Location: [facility] OR;  Service: Radiology;  Laterality: N/A;   TOE SURGERY     Patient Active Problem List   Diagnosis Date Noted   Acute ischemic right MCA stroke (HCC) 12/23/2023   Acute ischemic right anterior cerebral artery (ACA) stroke (HCC) 12/23/2023   Acute ischemic stroke (HCC) 09/26/2023   Right middle cerebral artery stroke (HCC)    Bradycardia    Dyslipidemia    Polysubstance abuse (HCC)    CVA (cerebral vascular accident) (HCC) 09/09/2021   Status post stroke 05/31/2020   Stroke (cerebrum) (HCC) 05/31/2020   Essential  hypertension 06/01/2019   Cocaine abuse (HCC) 06/01/2019   Stroke-like episode s/p tPA 05/31/2019   Hyperlipemia 10/11/2016   Tobacco abuse 10/11/2016   STEMI (ST elevation myocardial infarction) (HCC) 10/09/2016   Acute ST elevation myocardial infarction (STEMI) involving left anterior descending (LAD) coronary artery (HCC)     ONSET DATE: 12/25/2023 (referral date, CVA 12/23/23)  REFERRING DIAG: I63.9 (ICD-10-CM) - Acute CVA (cerebrovascular accident) (HCC)  THERAPY DIAG:  Other lack of coordination  Muscle weakness (generalized)  Abnormal posture  Unsteadiness on feet  Other  abnormalities of gait and mobility  Rationale for Evaluation and Treatment: Rehabilitation  SUBJECTIVE:                                                                                                                                                                                             SUBJECTIVE STATEMENT: Mark Herrera   Pt is making progress with getting his disability started, sees his PCP next week. On Oct 4 they decide of he qualifies for disability or not. Feels like he is walking better, does still go side to side at times. Denies any falls or acute changes. Has stayed away from his bike as he does not have a helmet yet, is going to talk to his PCP about this.   From Eval: Pt reports that he had a fall on 12/23/23 in his kitchen, he was not able to get back up by himself. His grandson came in and tried to help him up, he went to the hospital and found out he had a second stroke. He reports ongoing mild weakness on his L side as well as some memory and problem-solving impairments. 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 He has also noticed increased sensitivity in his L arm and L leg since the 2nd stroke. Pt also reports that when he gets up from sitting for a long time or gets up out of bed he is very stiff and stumbles, takes a little bit to loosen up his body.  Pt accompanied by: self  PERTINENT HISTORY: PMH is significant for multiple prior strokes w/ residual R hemiparesis and L sided deficits, CAD, and HLD.  Per Acute Care PT Note 12/24/2023: Pt is a 63 y.o. M presenting [facility] on [facility]23 following a fall w/ sudden onset of L sided weakness. Pt admitted w/ R MCA stroke and is s/p R common carotid arteriogram. PMH is significant for multiple prior strokes w/ residual R hemiparesis and L sided deficits, CAD, and HLD.  PAIN:  Are you having pain? No, stiffness  PRECAUTIONS: Fall  RED FLAGS: None   WEIGHT BEARING  RESTRICTIONS: No  FALLS: Has patient fallen in last 6 months? Yes. Number of falls 1 when he had the stroke, he was unable to get back up by himself and his grandson had to help him  LIVING ENVIRONMENT: Lives with: lives with their family, he lives with his daughter and son Lives in: House/apartment Stairs: Yes: External: 3 steps; can reach both Has following equipment at home: Environmental consultant - 2 wheeled and Shower bench, grab bars in shower  PLOF: Independent  PATIENT GOALS: to at least have 80% of my mobility back, I'm at a cool 65% right now  OBJECTIVE:  Note: Objective measures were completed at Evaluation unless otherwise noted.  DIAGNOSTIC FINDINGS:   Brain MRI 12/24/2023 IMPRESSION: 1. Small focus of acute ischemia within the anterior right frontal white matter. 2. Old right MCA territory infarct with encephalomalacia, gliosis, and chronic blood products. 3. Multifocal hyperintense T2-weighted signal within the cerebral white matter, most commonly due to chronic small vessel disease.  COGNITION: Overall cognitive status: pt reports mild memory impairments, takes longer than normal for problem solving   SENSATION: Decreased on L side as compared to R side Hypersensitivity to vibration on his L side   COORDINATION: WFL in BLE  EDEMA:  None  POSTURE: rounded shoulders, forward head, and posterior pelvic tilt  LOWER EXTREMITY ROM:     Active  Right Eval Left Eval  Hip flexion Tight hip flexors Tight hip flexors  Hip extension    Hip abduction    Hip adduction    Hip internal rotation    Hip external rotation    Knee flexion    Knee extension Tight HS Tight HS  Ankle dorsiflexion Tight heel cords Tight heel cords  Ankle plantarflexion    Ankle inversion    Ankle eversion     (Blank rows = not tested)  LOWER EXTREMITY MMT:    MMT Right Eval Left Eval  Hip flexion 5 4  Hip extension    Hip abduction    Hip adduction    Hip internal rotation    Hip  external rotation    Knee flexion 5 4  Knee extension 5 4  Ankle dorsiflexion 5 4  Ankle plantarflexion    Ankle inversion    Ankle eversion    (Blank rows = not tested)  BED MOBILITY:  Mod I per patient report  TRANSFERS: Sit to stand: Modified independence  Assistive device utilized: None     Stand to sit: Modified independence  Assistive device utilized: None     Chair to chair: Modified independence  Assistive device utilized: None       RAMP:  Not tested  CURB:  Not tested  STAIRS: Not tested GAIT: Findings:  Gait pattern: very stiff movements, decreased hip/knee flexion- Right, decreased hip/knee flexion- Left, antalgic, and trunk flexed Distance walked: various clinic distances Assistive device utilized: None Level of assistance: Modified independence Comments: see above   FUNCTIONAL TESTS:    [facility] PT Assessment - 02/19/24 1042       Ambulation/Gait   Gait velocity 32.8 ft over 9.85 sec = 3.3 ft/sec   no AD     Functional Gait  Assessment   Gait assessed  Yes    Gait Level Surface Walks 20 ft in less than 7 sec but greater than 5.5 sec, uses assistive device, slower speed, mild gait deviations, or deviates 6-10 in outside of the 12 in walkway width.  Change in Gait Speed Able to change speed, demonstrates mild gait deviations, deviates 6-10 in outside of the 12 in walkway width, or no gait deviations, unable to achieve a major change in velocity, or uses a change in velocity, or uses an assistive device.    Gait with Horizontal Head Turns Performs head turns smoothly with slight change in gait velocity (eg, minor disruption to smooth gait path), deviates 6-10 in outside 12 in walkway width, or uses an assistive device.    Gait with Vertical Head Turns Performs task with slight change in gait velocity (eg, minor disruption to smooth gait path), deviates 6 - 10 in outside 12 in walkway width or uses assistive device    Gait and Pivot Turn Pivot turns safely in  greater than 3 sec and stops with no loss of balance, or pivot turns safely within 3 sec and stops with mild imbalance, requires small steps to catch balance.    Step Over Obstacle Is able to step over one shoe box (4.5 in total height) without changing gait speed. No evidence of imbalance.    Gait with Narrow Base of Support Ambulates 4-7 steps.    Gait with Eyes Closed Walks 20 ft, uses assistive device, slower speed, mild gait deviations, deviates 6-10 in outside 12 in walkway width. Ambulates 20 ft in less than 9 sec but greater than 7 sec.    Ambulating Backwards Walks 20 ft, uses assistive device, slower speed, mild gait deviations, deviates 6-10 in outside 12 in walkway width.    Steps Alternating feet, must use rail.    Total Score 19    FGA comment: 19/30, medium fall risk           VITALS  Vitals:   02/19/24 1028  BP: 127/82  Pulse: (!) 59                                                                                                                                 TREATMENT DATE:  Self-Care/Home Management  Assessed vitals (see above) and WNL  NMR  To work on functional LE strengthening and SLS at countertop with one UE support: Alt L/R 6 step taps with red TB around ankles 2 x 10 reps   Added to HEP, see bolded below   Gait Gait pattern: knee flexed in stance- Right, knee flexed in stance- Left, trunk flexed, and poor foot clearance- Right Distance walked: Various clinic distances Assistive device utilized: None Level of assistance: Modified independence Comments: flexed trunk, flexed hips and knees, R foot catching occasionally; improved posture overall and LE clearance with decreased trunk and limb flexion as compared to previous observations  Physical Performance For LTG assessment  [facility] PT Assessment - 02/19/24 1042       Ambulation/Gait   Gait velocity 32.8 ft over 9.85 sec = 3.3 ft/sec   no AD     Functional Gait  Assessment   Gait  assessed  Yes     Gait Level Surface Walks 20 ft in less than 7 sec but greater than 5.5 sec, uses assistive device, slower speed, mild gait deviations, or deviates 6-10 in outside of the 12 in walkway width.    Change in Gait Speed Able to change speed, demonstrates mild gait deviations, deviates 6-10 in outside of the 12 in walkway width, or no gait deviations, unable to achieve a major change in velocity, or uses a change in velocity, or uses an assistive device.    Gait with Horizontal Head Turns Performs head turns smoothly with slight change in gait velocity (eg, minor disruption to smooth gait path), deviates 6-10 in outside 12 in walkway width, or uses an assistive device.    Gait with Vertical Head Turns Performs task with slight change in gait velocity (eg, minor disruption to smooth gait path), deviates 6 - 10 in outside 12 in walkway width or uses assistive device    Gait and Pivot Turn Pivot turns safely in greater than 3 sec and stops with no loss of balance, or pivot turns safely within 3 sec and stops with mild imbalance, requires small steps to catch balance.    Step Over Obstacle Is able to step over one shoe box (4.5 in total height) without changing gait speed. No evidence of imbalance.    Gait with Narrow Base of Support Ambulates 4-7 steps.    Gait with Eyes Closed Walks 20 ft, uses assistive device, slower speed, mild gait deviations, deviates 6-10 in outside 12 in walkway width. Ambulates 20 ft in less than 9 sec but greater than 7 sec.    Ambulating Backwards Walks 20 ft, uses assistive device, slower speed, mild gait deviations, deviates 6-10 in outside 12 in walkway width.    Steps Alternating feet, must use rail.    Total Score 19    FGA comment: 19/30, medium fall risk            PATIENT EDUCATION: Education details: continue HEP, added to HEP, results of OM and functional implications, plan to add 2 PT visits to finalize HEP Person educated: Patient Education method: Explanation,  Demonstration, Tactile cues, Verbal cues, and Handouts Education comprehension: verbalized understanding, returned demonstration, and needs further education  HOME EXERCISE PROGRAM: Access Code: [email or web address] URL: [email or web address] Date: 01/21/2024 Prepared by: Waddell Southgate  Exercises - Modified Thomas Stretch  - 1 x daily - 7 x weekly - 1 sets - 3-5 reps - 30 sec hold - Long Sitting Hamstring Stretch  - 1 x daily - 7 x weekly - 1 sets - 3-5 reps - 30 sec hold - Long Sitting Calf Stretch with Strap  - 1 x daily - 7 x weekly - 1 sets - 3-5 reps - 30 sec hold - Heel Raises with Counter Support  - 1 x daily - 7 x weekly - 3 sets - 10 reps - Heel Toe Raises with Counter Support  - 1 x daily - 7 x weekly - 3 sets - 10 reps - Alternating Step Taps with Counter Support  - 1 x daily - 7 x weekly - 3 sets - 10 reps   GOALS: Goals reviewed with patient? Yes  SHORT TERM GOALS=LONG TERM GOALS due to length of POC   LONG TERM GOALS: Target date: 02/19/2024   Pt will be independent with final HEP for improved strength, balance, transfers and gait. Baseline: in process of being finalized (9/18) Goal status: IN PROGRESS  2.  Pt will improve gait velocity to at least 3.5 ft/sec for improved gait efficiency and performance at mod I level  Baseline: 3.24 ft/sec mod I (8/12), 3.33 ft/sec mod I (9/18) Goal status: IN PROGRESS  3.  Pt will improve FGA to 23/30 for decreased fall risk  Baseline: 19/30, 19/30 (9/18) Goal status: NOT MET  4.  Pt will trial LRAD to determine if he could benefit from use of an AD for improved balance and decreased fall risk. Baseline: no AD at baseline, AD not needed (9/18) Goal status: MET   NEW SHORT TERM GOALS=LONG TERM GOALS due to length of POC  NEW LONG TERM GOALS:  Target date: 03/18/2024  Pt will be independent with final HEP for improved strength, balance, transfers and gait. Baseline: in process of being finalized (9/18) Goal status: IN  PROGRESS  2.  Pt will improve gait velocity to at least 3.5 ft/sec for improved gait efficiency and performance at mod I level  Baseline: 3.24 ft/sec mod I (8/12), 3.33 ft/sec mod I (9/18) Goal status: IN PROGRESS     ASSESSMENT:  CLINICAL IMPRESSION: Emphasis of skilled PT session on assessing LTG due to goal due date and pt not being seen for PT since 02/04/2024 due to issues with transportation and consistent attendance at PT sessions. He has made progress towards 2/4 LTG assessed this date as he is making progress towards being independent with his final HEP - his HEP is in progress with plan to continue to add exercises over the next two scheduled sessions. He also made progress towards reaching his gait speed goal with improvement from 3.24 ft/sec at eval to 3.33 ft/sec this session, did not quite meet his goal of 3.5 ft/sec. His FGA score did not change at all therefore he did not meet 1/4 LTG assessed. He did meet 1/4 LTG assessed as AD were assessed and it was determined he does not need an AD at this time. He continues to benefit from skilled PT services to work towards finalizing his HEP and ensuring good understanding of how to perform these exercises correctly and safely. Continue POC.    OBJECTIVE IMPAIRMENTS: Abnormal gait, decreased balance, decreased cognition, decreased mobility, difficulty walking, decreased ROM, decreased strength, impaired perceived functional ability, impaired sensation, and postural dysfunction.   ACTIVITY LIMITATIONS: carrying, lifting, bending, sitting, standing, squatting, stairs, transfers, and bed mobility  PARTICIPATION LIMITATIONS: driving, community activity, and occupation  PERSONAL FACTORS: Fitness and 3+ comorbidities:  multiple prior strokes w/ residual R hemiparesis and L sided deficits, CAD, and HLD.are also affecting patient's functional outcome.   REHAB POTENTIAL: Good  CLINICAL DECISION MAKING: Evolving/moderate complexity  EVALUATION  COMPLEXITY: Moderate  PLAN:  PT FREQUENCY: 2x/week  PT DURATION: 4 weeks + 4 weeks (recert, just scheduled 2 visits initially)  PLANNED INTERVENTIONS: 02835- PT Re-evaluation, 97750- Physical Performance Testing, 97110-Therapeutic exercises, 97530- Therapeutic activity, [id number]- Neuromuscular re-education, 97535- Self Care, [id number]- Manual therapy, [id number]- Gait training, [phone]- Orthotic Initial, [phone]- Orthotic/Prosthetic subsequent, [phone]- Electrical stimulation (manual), [phone] (1-2 muscles), [id number] (3+ muscles)- Dry Needling, Patient/Family education, Balance training, Stair training, Taping, Joint mobilization, Spinal mobilization, Cognitive remediation, DME instructions, Cryotherapy, and Moist heat  PLAN FOR NEXT SESSION: assess vitals, pt needs to get a BP machine for use at home-did he reach out to PCP?, dynamic balance (gait with head turns, stepping over obstacles, tandem gait), prone? Quadruped?, work on increasing step length and step height, prone I/Y/T, side step w/reach, half kneel, add to and finalize HEP  [employer]  Carliss Quast, PT Waddell Southgate, PT, DPT, CSRS   02/19/2024, 11:07 AM

## 2024-02-19 NOTE — Therapy (Incomplete)
 OUTPATIENT PHYSICAL THERAPY NEURO TREATMENT   Patient Name: Mark Herrera MRN: [id number] DOB:1961/05/18, 63 y.o., male Today's Date: 02/19/2024   PCP: [facility] REFERRING PROVIDER: Jerri Pfeiffer, MD  END OF SESSION:       Past Medical History:  Diagnosis Date   CAD (coronary artery disease)    10/08/16 STEMI DES x1 to pLAD EF 45%   Cocaine abuse (HCC)    CVA (cerebral vascular accident) (HCC)    Gout    Tobacco abuse    Past Surgical History:  Procedure Laterality Date   CORONARY STENT INTERVENTION N/A 10/08/2016   Procedure: Coronary Stent Intervention;  Surgeon: Verlin Lonni BIRCH, MD;  Location: [facility] INVASIVE CV LAB;  Service: Cardiovascular;  Laterality: N/A;   IR CT HEAD LTD  05/31/2020   IR CT HEAD LTD  09/26/2023   IR CT HEAD LTD  12/23/2023   IR PERCUTANEOUS ART THROMBECTOMY/INFUSION INTRACRANIAL INC DIAG ANGIO  05/31/2020   IR PERCUTANEOUS ART THROMBECTOMY/INFUSION INTRACRANIAL INC DIAG ANGIO  09/26/2023   IR PERCUTANEOUS ART THROMBECTOMY/INFUSION INTRACRANIAL INC DIAG ANGIO  12/23/2023   IR US  GUIDE VASC ACCESS RIGHT  05/31/2020   IR US  GUIDE VASC ACCESS RIGHT  09/26/2023   LEFT HEART CATH AND CORONARY ANGIOGRAPHY N/A 10/08/2016   Procedure: Left Heart Cath and Coronary Angiography;  Surgeon: Verlin Lonni BIRCH, MD;  Location: [facility] INVASIVE CV LAB;  Service: Cardiovascular;  Laterality: N/A;   RADIOLOGY WITH ANESTHESIA N/A 05/31/2020   Procedure: IR WITH ANESTHESIA;  Surgeon: Radiologist, Medication, MD;  Location: [facility] OR;  Service: Radiology;  Laterality: N/A;   RADIOLOGY WITH ANESTHESIA N/A 09/05/2021   Procedure: RADIOLOGY WITH ANESTHESIA;  Surgeon: Radiologist, Medication, MD;  Location: [facility] OR;  Service: Radiology;  Laterality: N/A;   RADIOLOGY WITH ANESTHESIA N/A 09/26/2023   Procedure: RADIOLOGY WITH ANESTHESIA;  Surgeon: Radiologist, Medication, MD;  Location: [facility] OR;  Service: Radiology;  Laterality: N/A;   RADIOLOGY WITH ANESTHESIA  N/A 12/23/2023   Procedure: RADIOLOGY WITH ANESTHESIA;  Surgeon: Dolphus Carrion, MD;  Location: [facility] OR;  Service: Radiology;  Laterality: N/A;   TOE SURGERY     Patient Active Problem List   Diagnosis Date Noted   Acute ischemic right MCA stroke (HCC) 12/23/2023   Acute ischemic right anterior cerebral artery (ACA) stroke (HCC) 12/23/2023   Acute ischemic stroke (HCC) 09/26/2023   Right middle cerebral artery stroke (HCC)    Bradycardia    Dyslipidemia    Polysubstance abuse (HCC)    CVA (cerebral vascular accident) (HCC) 09/09/2021   Status post stroke 05/31/2020   Stroke (cerebrum) (HCC) 05/31/2020   Essential hypertension 06/01/2019   Cocaine abuse (HCC) 06/01/2019   Stroke-like episode s/p tPA 05/31/2019   Hyperlipemia 10/11/2016   Tobacco abuse 10/11/2016   STEMI (ST elevation myocardial infarction) (HCC) 10/09/2016   Acute ST elevation myocardial infarction (STEMI) involving left anterior descending (LAD) coronary artery (HCC)     ONSET DATE: 12/25/2023 (referral date, CVA 12/23/23)  REFERRING DIAG: I63.9 (ICD-10-CM) - Acute CVA (cerebrovascular accident) (HCC)  THERAPY DIAG:  No diagnosis found.  Rationale for Evaluation and Treatment: Rehabilitation  SUBJECTIVE:  SUBJECTIVE STATEMENT: [name]   Pt presents without AD. States he is having pain today, did not do anything this weekend. States he is working on trying to rest. I am hard headed. Denies falls since he was last here.   I am really pissed off because I have a $200 bicycle that no one will let me ride. Pt reports his son has put a bike lock on his bike due to him riding it without a helmet   ***   From Eval: Pt reports that he had a fall on 12/23/23 in his kitchen, he was not able to get back up by himself. His  grandson came in and tried to help him up, he went to the hospital and found out he had a second stroke. He reports ongoing mild weakness on his L side as well as some memory and problem-solving impairments. 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 He has also noticed increased sensitivity in his L arm and L leg since the 2nd stroke. Pt also reports that when he gets up from sitting for a long time or gets up out of bed he is very stiff and stumbles, takes a little bit to loosen up his body.  Pt accompanied by: self  PERTINENT HISTORY: PMH is significant for multiple prior strokes w/ residual R hemiparesis and L sided deficits, CAD, and HLD.  Per Acute Care PT Note 12/24/2023: Pt is a 63 y.o. M presenting to [facility] on 12/23/23 following a fall w/ sudden onset of L sided weakness. Pt admitted w/ R MCA stroke and is s/p R common carotid arteriogram. PMH is significant for multiple prior strokes w/ residual R hemiparesis and L sided deficits, CAD, and HLD.  PAIN:  Are you having pain? No, stiffness  PRECAUTIONS: Fall  RED FLAGS: None   WEIGHT BEARING RESTRICTIONS: No  FALLS: Has patient fallen in last 6 months? Yes. Number of falls 1 when he had the stroke, he was unable to get back up by himself and his grandson had to help him  LIVING ENVIRONMENT: Lives with: lives with their family, he lives with his daughter and son Lives in: House/apartment Stairs: Yes: External: 3 steps; can reach both Has following equipment at home: Environmental consultant - 2 wheeled and Shower bench, grab bars in shower  PLOF: Independent  PATIENT GOALS: to at least have 80% of my mobility back, I'm at a cool 65% right now  OBJECTIVE:  Note: Objective measures were completed at Evaluation unless otherwise noted.  DIAGNOSTIC FINDINGS:   Brain MRI 12/24/2023 IMPRESSION: 1. Small focus of acute ischemia within the anterior right frontal white matter. 2. Old right MCA  territory infarct with encephalomalacia, gliosis, and chronic blood products. 3. Multifocal hyperintense T2-weighted signal within the cerebral white matter, most commonly due to chronic small vessel disease.  COGNITION: Overall cognitive status: pt reports mild memory impairments, takes longer than normal for problem solving   SENSATION: Decreased on L side as compared to R side Hypersensitivity to vibration on his L side   COORDINATION: WFL in BLE  EDEMA:  None  POSTURE: rounded shoulders, forward head, and posterior pelvic tilt  LOWER EXTREMITY ROM:     Active  Right Eval Left Eval  Hip flexion Tight hip flexors Tight hip flexors  Hip extension    Hip abduction    Hip adduction    Hip internal rotation    Hip external rotation    Knee  flexion    Knee extension Tight HS Tight HS  Ankle dorsiflexion Tight heel cords Tight heel cords  Ankle plantarflexion    Ankle inversion    Ankle eversion     (Blank rows = not tested)  LOWER EXTREMITY MMT:    MMT Right Eval Left Eval  Hip flexion 5 4  Hip extension    Hip abduction    Hip adduction    Hip internal rotation    Hip external rotation    Knee flexion 5 4  Knee extension 5 4  Ankle dorsiflexion 5 4  Ankle plantarflexion    Ankle inversion    Ankle eversion    (Blank rows = not tested)  BED MOBILITY:  Mod I per patient report  TRANSFERS: Sit to stand: Modified independence  Assistive device utilized: None     Stand to sit: Modified independence  Assistive device utilized: None     Chair to chair: Modified independence  Assistive device utilized: None       RAMP:  Not tested  CURB:  Not tested  STAIRS: Not tested GAIT: Findings:  Gait pattern: very stiff movements, decreased hip/knee flexion- Right, decreased hip/knee flexion- Left, antalgic, and trunk flexed Distance walked: various clinic distances Assistive device utilized: None Level of assistance: Modified independence Comments: see  above   FUNCTIONAL TESTS:      VITALS  There were no vitals filed for this visit.                                                                                                                               TREATMENT DATE:  Self-Care/Home Management  Assessed vitals (see above) and WNL Educated pt on importance of safety at home in order to promote independence and longevity. Pt states he is stubborn and hard headed and is told the same things from his family but is determined. Informed pt that his current actions will impact his future independence and mobility and it is crucial to focus on proper movement and safety now so he can be independent later. Pt verbalized understanding.    NMR  6 Blaze pods on random reach setting for improved postural control, truncal mobility, reciprocal coordination and proximal stability.  Performed on 2 minute intervals with 30s rest periods.  Pt requires SBA guarding. Round 1:  quadruped on mat  setup w/3 pods placed in vertical line to either side of pt. Cued pt to perform cross-body taps. 44 hits. Round 2:  same pod position w/pt in quadruped on rocker in L/R position (placed airex on rocker).  40 hits. Round 3:  same setup w/pods further apart.  35 hits. Notable errors/deficits:  Increased lean to R side when on rockerboard  From mat table, sit to stand w/alt fwd step and 8# slam ball, x10 reps per side, for improved stepping strategy, immediate standing balance, reactive balance and step clearance. Pt unable to stabilize well when stepping fwd w/RLE,  frequently placing his L foot in line w/his R foot rather than leaving LLE posterior. Max cues to focus on only stepping w/one foot. Also cued pt to maintain sit to stand in between reps, as pt focusing on just throwing the ball.  ***   Gait Gait pattern: knee flexed in stance- Right, knee flexed in stance- Left, trunk flexed, and poor foot clearance- Right Distance walked: Various clinic  distances Assistive device utilized: None Level of assistance: Modified independence Comments: flexed trunk, flexed hips and knees, R foot catching occasionally; improved posture following blaze pod activity and pt reported feeling taller  ***   PATIENT EDUCATION: Education details: continue HEP, see self-care above *** Person educated: Patient Education method: Medical illustrator Education comprehension: verbalized understanding, returned demonstration, and needs further education  HOME EXERCISE PROGRAM: Access Code: [email or web address] URL: [email or web address] Date: 01/21/2024 Prepared by: Waddell Southgate  Exercises - Modified Thomas Stretch  - 1 x daily - 7 x weekly - 1 sets - 3-5 reps - 30 sec hold - Long Sitting Hamstring Stretch  - 1 x daily - 7 x weekly - 1 sets - 3-5 reps - 30 sec hold - Long Sitting Calf Stretch with Strap  - 1 x daily - 7 x weekly - 1 sets - 3-5 reps - 30 sec hold - Heel Raises with Counter Support  - 1 x daily - 7 x weekly - 3 sets - 10 reps - Heel Toe Raises with Counter Support  - 1 x daily - 7 x weekly - 3 sets - 10 reps  GOALS: Goals reviewed with patient? Yes  SHORT TERM GOALS=LONG TERM GOALS due to length of POC   LONG TERM GOALS: Target date: 02/19/2024***   Pt will be independent with final HEP for improved strength, balance, transfers and gait. Baseline:  Goal status: INITIAL  2.  Pt will improve gait velocity to at least 3.5 ft/sec for improved gait efficiency and performance at mod I level  Baseline: 3.24 ft/sec mod I (8/12) Goal status: INITIAL  3.  Pt will improve FGA to 23/30 for decreased fall risk  Baseline: 19/30 Goal status: INITIAL  4.  Pt will trial LRAD to determine if he could benefit from use of an AD for improved balance and decreased fall risk. Baseline: no AD at baseline Goal status: INITIAL   ASSESSMENT:  CLINICAL IMPRESSION: Emphasis of skilled PT session on*** pt education regarding safety,  improved postural control, reciprocal coordination and stepping strategy. Pt reports his son put a bike lock on his bike so he will not ride it and he plans on asking PCP for advice on a helmet at next appointment. Continue to inform pt he is not safe to ride a bike without a helmet. Pt very challenged by quadruped blaze pod activity and demonstrated weight shift to R side throughout. Pt also unable to stabilize on LLE well, resulting in frequent compensations to perform modified single leg tasks, requiring max cues for proper technique. Continue POC.    OBJECTIVE IMPAIRMENTS: Abnormal gait, decreased balance, decreased cognition, decreased mobility, difficulty walking, decreased ROM, decreased strength, impaired perceived functional ability, impaired sensation, and postural dysfunction.   ACTIVITY LIMITATIONS: carrying, lifting, bending, sitting, standing, squatting, stairs, transfers, and bed mobility  PARTICIPATION LIMITATIONS: driving, community activity, and occupation  PERSONAL FACTORS: Fitness and 3+ comorbidities:  multiple prior strokes w/ residual R hemiparesis and L sided deficits, CAD, and HLD.are also affecting patient's functional outcome.   REHAB POTENTIAL: Good  CLINICAL DECISION  MAKING: Evolving/moderate complexity  EVALUATION COMPLEXITY: Moderate  PLAN:  PT FREQUENCY: 2x/week  PT DURATION: 4 weeks  PLANNED INTERVENTIONS: 97164- PT Re-evaluation, 97750- Physical Performance Testing, 97110-Therapeutic exercises, 97530- Therapeutic activity, [id number]- Neuromuscular re-education, 97535- Self Care, [id number]- Manual therapy, [id number]- Gait training, [phone]- Orthotic Initial, [phone]- Orthotic/Prosthetic subsequent, [phone]- Electrical stimulation (manual), [phone] (1-2 muscles), [phone] (3+ muscles)- Dry Needling, Patient/Family education, Balance training, Stair training, Taping, Joint mobilization, Spinal mobilization, Cognitive remediation, DME instructions, Cryotherapy, and Moist heat  PLAN  FOR NEXT SESSION: assess vitals, pt needs to get a BP machine for use at home-did he reach out to PCP?, how is initial HEP?  functional strengthening (sit to stands, step ups), dynamic balance (gait with head turns, stepping over obstacles, tandem gait), prone? Quadruped?, work on increasing step length and step height, prone I/Y/T, side step w/reach***   Harue Pribble, PT Waddell Southgate, PT, DPT, CSRS   02/19/2024, 7:39 AM

## 2024-02-23 ENCOUNTER — Ambulatory Visit: Payer: Self-pay | Admitting: Physical Therapy

## 2024-02-23 VITALS — BP 120/71 | HR 57

## 2024-02-23 DIAGNOSIS — R278 Other lack of coordination: Secondary | ICD-10-CM

## 2024-02-23 DIAGNOSIS — R293 Abnormal posture: Secondary | ICD-10-CM

## 2024-02-23 DIAGNOSIS — M6281 Muscle weakness (generalized): Secondary | ICD-10-CM

## 2024-02-23 DIAGNOSIS — R2689 Other abnormalities of gait and mobility: Secondary | ICD-10-CM

## 2024-02-23 DIAGNOSIS — R2681 Unsteadiness on feet: Secondary | ICD-10-CM

## 2024-02-23 NOTE — Therapy (Signed)
 OUTPATIENT PHYSICAL THERAPY NEURO TREATMENT   Patient Name: Mark Herrera MRN: [id number] DOB:15-Mar-1961, 63 y.o., male Today's Date: 02/23/2024   PCP: [facility] REFERRING PROVIDER: Jerri Pfeiffer, MD  END OF SESSION:  PT End of Session - 02/23/24 9061     Visit Number 6    Number of Visits 16   per Medicaid approval, recert   Date for Recertification  03/21/24   per Medicaid approval, recert   Authorization Type Medicaid    Authorization Time Period Approved 16 visits 01/21/24-03/21/24    Authorization - Number of Visits 16    Progress Note Due on Visit 10    PT Start Time 0935   pt arrived late   PT Stop Time 1014    PT Time Calculation (min) 39 min    Equipment Utilized During Treatment Gait belt    Activity Tolerance Patient tolerated treatment well    Behavior During Therapy WFL for tasks assessed/performed               Past Medical History:  Diagnosis Date   CAD (coronary artery disease)    10/08/16 STEMI DES x1 to pLAD EF 45%   Cocaine abuse (HCC)    CVA (cerebral vascular accident) (HCC)    Gout    Tobacco abuse    Past Surgical History:  Procedure Laterality Date   CORONARY STENT INTERVENTION N/A 10/08/2016   Procedure: Coronary Stent Intervention;  Surgeon: Verlin Lonni BIRCH, MD;  Location: [facility] INVASIVE CV LAB;  Service: Cardiovascular;  Laterality: N/A;   IR CT HEAD LTD  05/31/2020   IR CT HEAD LTD  09/26/2023   IR CT HEAD LTD  12/23/2023   IR PERCUTANEOUS ART THROMBECTOMY/INFUSION INTRACRANIAL INC DIAG ANGIO  05/31/2020   IR PERCUTANEOUS ART THROMBECTOMY/INFUSION INTRACRANIAL INC DIAG ANGIO  09/26/2023   IR PERCUTANEOUS ART THROMBECTOMY/INFUSION INTRACRANIAL INC DIAG ANGIO  12/23/2023   IR US  GUIDE VASC ACCESS RIGHT  05/31/2020   IR US  GUIDE VASC ACCESS RIGHT  09/26/2023   LEFT HEART CATH AND CORONARY ANGIOGRAPHY N/A 10/08/2016   Procedure: Left Heart Cath and Coronary Angiography;  Surgeon: Verlin Lonni BIRCH, MD;   Location: [facility] INVASIVE CV LAB;  Service: Cardiovascular;  Laterality: N/A;   RADIOLOGY WITH ANESTHESIA N/A 05/31/2020   Procedure: IR WITH ANESTHESIA;  Surgeon: Radiologist, Medication, MD;  Location: [facility] OR;  Service: Radiology;  Laterality: N/A;   RADIOLOGY WITH ANESTHESIA N/A 09/05/2021   Procedure: RADIOLOGY WITH ANESTHESIA;  Surgeon: Radiologist, Medication, MD;  Location: [facility] OR;  Service: Radiology;  Laterality: N/A;   RADIOLOGY WITH ANESTHESIA N/A 09/26/2023   Procedure: RADIOLOGY WITH ANESTHESIA;  Surgeon: Radiologist, Medication, MD;  Location: [facility] OR;  Service: Radiology;  Laterality: N/A;   RADIOLOGY WITH ANESTHESIA N/A 12/23/2023   Procedure: RADIOLOGY WITH ANESTHESIA;  Surgeon: Dolphus Carrion, MD;  Location: [facility] OR;  Service: Radiology;  Laterality: N/A;   TOE SURGERY     Patient Active Problem List   Diagnosis Date Noted   Acute ischemic right MCA stroke (HCC) 12/23/2023   Acute ischemic right anterior cerebral artery (ACA) stroke (HCC) 12/23/2023   Acute ischemic stroke (HCC) 09/26/2023   Right middle cerebral artery stroke (HCC)    Bradycardia    Dyslipidemia    Polysubstance abuse (HCC)    CVA (cerebral vascular accident) (HCC) 09/09/2021   Status post stroke 05/31/2020   Stroke (cerebrum) (HCC) 05/31/2020   Essential hypertension 06/01/2019   Cocaine abuse (HCC) 06/01/2019   Stroke-like episode  s/p tPA 05/31/2019   Hyperlipemia 10/11/2016   Tobacco abuse 10/11/2016   STEMI (ST elevation myocardial infarction) (HCC) 10/09/2016   Acute ST elevation myocardial infarction (STEMI) involving left anterior descending (LAD) coronary artery (HCC)     ONSET DATE: 12/25/2023 (referral date, CVA 12/23/23)  REFERRING DIAG: I63.9 (ICD-10-CM) - Acute CVA (cerebrovascular accident) (HCC)  THERAPY DIAG:  Muscle weakness (generalized)  Abnormal posture  Unsteadiness on feet  Other abnormalities of gait and mobility  Other lack of coordination  Rationale for Evaluation and  Treatment: Rehabilitation  SUBJECTIVE:                                                                                                                                                                                             SUBJECTIVE STATEMENT: Mark Herrera   Pt reports he had a good weekend. He has been walking around his living area with exercise band around his legs. He denies any falls. He can't sit still, he wants to keep moving.  He reports that he sees his PCP tomorrow.   From Eval: Pt reports that he had a fall on 12/23/23 in his kitchen, he was not able to get back up by himself. His grandson came in and tried to help him up, he went to the hospital and found out he had a second stroke. He reports ongoing mild weakness on his L side as well as some memory and problem-solving impairments. 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 He has also noticed increased sensitivity in his L arm and L leg since the 2nd stroke. Pt also reports that when he gets up from sitting for a long time or gets up out of bed he is very stiff and stumbles, takes a little bit to loosen up his body.  Pt accompanied by: self  PERTINENT HISTORY: PMH is significant for multiple prior strokes w/ residual R hemiparesis and L sided deficits, CAD, and HLD.  Per Acute Care PT Note 12/24/2023: Pt is a 63 y.o. M presenting to [facility] on 12/23/23 following a fall w/ sudden onset of L sided weakness. Pt admitted w/ R MCA stroke and is s/p R common carotid arteriogram. PMH is significant for multiple prior strokes w/ residual R hemiparesis and L sided deficits, CAD, and HLD.  PAIN:  Are you having pain? No, stiffness  PRECAUTIONS: Fall  RED FLAGS: None   WEIGHT BEARING RESTRICTIONS: No  FALLS: Has patient fallen in last 6 months? Yes. Number of falls 1 when he had the stroke, he was unable to get back up by himself  and his grandson had to help him  LIVING  ENVIRONMENT: Lives with: lives with their family, he lives with his daughter and son Lives in: House/apartment Stairs: Yes: External: 3 steps; can reach both Has following equipment at home: Environmental consultant - 2 wheeled and Shower bench, grab bars in shower  PLOF: Independent  PATIENT GOALS: to at least have 80% of my mobility back, I'm at a cool 65% right now  OBJECTIVE:  Note: Objective measures were completed at Evaluation unless otherwise noted.  DIAGNOSTIC FINDINGS:   Brain MRI 12/24/2023 IMPRESSION: 1. Small focus of acute ischemia within the anterior right frontal white matter. 2. Old right MCA territory infarct with encephalomalacia, gliosis, and chronic blood products. 3. Multifocal hyperintense T2-weighted signal within the cerebral white matter, most commonly due to chronic small vessel disease.  COGNITION: Overall cognitive status: pt reports mild memory impairments, takes longer than normal for problem solving   SENSATION: Decreased on L side as compared to R side Hypersensitivity to vibration on his L side   COORDINATION: WFL in BLE  EDEMA:  None  POSTURE: rounded shoulders, forward head, and posterior pelvic tilt  LOWER EXTREMITY ROM:     Active  Right Eval Left Eval  Hip flexion Tight hip flexors Tight hip flexors  Hip extension    Hip abduction    Hip adduction    Hip internal rotation    Hip external rotation    Knee flexion    Knee extension Tight HS Tight HS  Ankle dorsiflexion Tight heel cords Tight heel cords  Ankle plantarflexion    Ankle inversion    Ankle eversion     (Blank rows = not tested)  LOWER EXTREMITY MMT:    MMT Right Eval Left Eval  Hip flexion 5 4  Hip extension    Hip abduction    Hip adduction    Hip internal rotation    Hip external rotation    Knee flexion 5 4  Knee extension 5 4  Ankle dorsiflexion 5 4  Ankle plantarflexion    Ankle inversion    Ankle eversion    (Blank rows = not tested)  BED MOBILITY:   Mod I per patient report  TRANSFERS: Sit to stand: Modified independence  Assistive device utilized: None     Stand to sit: Modified independence  Assistive device utilized: None     Chair to chair: Modified independence  Assistive device utilized: None       RAMP:  Not tested  CURB:  Not tested  STAIRS: Not tested GAIT: Findings:  Gait pattern: very stiff movements, decreased hip/knee flexion- Right, decreased hip/knee flexion- Left, antalgic, and trunk flexed Distance walked: various clinic distances Assistive device utilized: None Level of assistance: Modified independence Comments: see above   FUNCTIONAL TESTS:       VITALS  Vitals:   02/23/24 0944  BP: 120/71  Pulse: (!) 57  TREATMENT DATE:  Self-Care/Home Management  Assessed vitals (see above) and WNL  NMR  To work on functional LE strengthening and proximal LE strengthening In half-kneel position on red mat on floor: L/R half kneel 8# weighted ball punch-outs, 2 x 10 reps B In quadruped position on mat table: Alt UE lifts x 10 reps B Alt LE kicks x 10 reps B Bird dogs x 5 reps B Increased instability with this, needs min A for balance   Added appropriate exercises to HEP, see bolded below   Gait Gait pattern: knee flexed in stance- Right, knee flexed in stance- Left, trunk flexed, and poor foot clearance- Right Distance walked: Various clinic distances Assistive device utilized: None Level of assistance: Modified independence Comments: flexed trunk, flexed hips and knees, R foot catching occasionally; improved posture overall and LE clearance with decreased trunk and limb flexion as compared to previous observations    PATIENT EDUCATION: Education details: continue HEP, added to HEP, plan to d/c next visit Person educated: Patient Education method: Explanation,  Demonstration, Tactile cues, Verbal cues, and Handouts Education comprehension: verbalized understanding, returned demonstration, and needs further education  HOME EXERCISE PROGRAM: Access Code: [email or web address] URL: [email or web address] Date: 01/21/2024 Prepared by: Waddell Southgate  Exercises - Modified Thomas Stretch  - 1 x daily - 7 x weekly - 1 sets - 3-5 reps - 30 sec hold - Long Sitting Hamstring Stretch  - 1 x daily - 7 x weekly - 1 sets - 3-5 reps - 30 sec hold - Long Sitting Calf Stretch with Strap  - 1 x daily - 7 x weekly - 1 sets - 3-5 reps - 30 sec hold - Heel Raises with Counter Support  - 1 x daily - 7 x weekly - 3 sets - 10 reps - Heel Toe Raises with Counter Support  - 1 x daily - 7 x weekly - 3 sets - 10 reps - Alternating Step Taps with Counter Support  - 1 x daily - 7 x weekly - 3 sets - 10 reps - Narrow Half-Kneeling Balance  - 1 x daily - 7 x weekly - 3 sets - 10 reps - Quadruped Alternating Leg Extensions  - 1 x daily - 7 x weekly - 3 sets - 10 reps   GOALS: Goals reviewed with patient? Yes  SHORT TERM GOALS=LONG TERM GOALS due to length of POC   LONG TERM GOALS: Target date: 02/19/2024   Pt will be independent with final HEP for improved strength, balance, transfers and gait. Baseline: in process of being finalized (9/18) Goal status: IN PROGRESS  2.  Pt will improve gait velocity to at least 3.5 ft/sec for improved gait efficiency and performance at mod I level  Baseline: 3.24 ft/sec mod I (8/12), 3.33 ft/sec mod I (9/18) Goal status: IN PROGRESS  3.  Pt will improve FGA to 23/30 for decreased fall risk  Baseline: 19/30, 19/30 (9/18) Goal status: NOT MET  4.  Pt will trial LRAD to determine if he could benefit from use of an AD for improved balance and decreased fall risk. Baseline: no AD at baseline, AD not needed (9/18) Goal status: MET   NEW SHORT TERM GOALS=LONG TERM GOALS due to length of POC  NEW LONG TERM GOALS:  Target date:  03/18/2024  Pt will be independent with final HEP for improved strength, balance, transfers and gait. Baseline: in process of being finalized (9/18) Goal status: IN PROGRESS  2.  Pt will improve gait velocity to at  least 3.5 ft/sec for improved gait efficiency and performance at mod I level  Baseline: 3.24 ft/sec mod I (8/12), 3.33 ft/sec mod I (9/18) Goal status: IN PROGRESS     ASSESSMENT:  CLINICAL IMPRESSION: Emphasis of skilled PT session on trialing various NMR exercises in order to add his HEP. Pt is challenged by half-kneel tasks and quadruped LE lifts, added to his HEP. He continues to benefit from skilled PT services in order to finalize his HEP so that he can continue to progress independently at home after d/c from PT next visit. Continue POC.    OBJECTIVE IMPAIRMENTS: Abnormal gait, decreased balance, decreased cognition, decreased mobility, difficulty walking, decreased ROM, decreased strength, impaired perceived functional ability, impaired sensation, and postural dysfunction.   ACTIVITY LIMITATIONS: carrying, lifting, bending, sitting, standing, squatting, stairs, transfers, and bed mobility  PARTICIPATION LIMITATIONS: driving, community activity, and occupation  PERSONAL FACTORS: Fitness and 3+ comorbidities:  multiple prior strokes w/ residual R hemiparesis and L sided deficits, CAD, and HLD.are also affecting patient's functional outcome.   REHAB POTENTIAL: Good  CLINICAL DECISION MAKING: Evolving/moderate complexity  EVALUATION COMPLEXITY: Moderate  PLAN:  PT FREQUENCY: 2x/week  PT DURATION: 4 weeks + 4 weeks (recert, just scheduled 2 visits initially)  PLANNED INTERVENTIONS: 02835- PT Re-evaluation, 97750- Physical Performance Testing, 97110-Therapeutic exercises, 97530- Therapeutic activity, [id number]- Neuromuscular re-education, 97535- Self Care, 02859- Manual therapy, [id number]- Gait training, [phone]- Orthotic Initial, [phone]- Orthotic/Prosthetic subsequent,  [phone]- Electrical stimulation (manual), [phone] (1-2 muscles), [id number] (3+ muscles)- Dry Needling, Patient/Family education, Balance training, Stair training, Taping, Joint mobilization, Spinal mobilization, Cognitive remediation, DME instructions, Cryotherapy, and Moist heat  PLAN FOR NEXT SESSION: assess vitals, add to and finalize HEP, assess LTG and d/c  Waddell Southgate, PT Waddell Southgate, PT, DPT, CSRS   02/23/2024, 10:14 AM

## 2024-02-24 NOTE — Progress Notes (Signed)
 Chief Complaint  Patient presents with  . Follow Up    Follow up on chronic conditions He has concerns of frequent urination at night since having a stroke in July He is trying to get on disability.  Patient could not give urine sample, will at the end of visit    HPI Subjective Patient ID: Mark Herrera is a 63 year old male who presents for Follow Up (Follow up on chronic conditions/He has concerns of frequent urination at night since having a stroke in July/He is trying to get on disability. /Patient could not give urine sample, will at the end of visit)  Patient  status post multiple strokes secondary to polysubstance use including cocaine, cannabis, and tobacco use presents for follow ischemic stroke.  Patient reports taking atorvastatin  80 mg daily and Plavix  75 mg daily for management of CVA history.  He denies chest pain, heart palpitations, abdominal pain, dyspnea, orthopnea, overt GI bleeding or melanotic stools.  He states he needs to have this evaluation because he had assistance with the disability application during from a social worker during his recent hospitalized that is still pending.  Patient reports acute complaints of frequent urination at night and urgency.  He states his symptoms started about 3 weeks ago. Patient denies dysuria, hematuria, dribbling or incontinence.  His last PSA level was 0.4 on 01/08/2023.  Patient states that he is still smoking about 7 cigarettes per day, uses marijuana every other day last used yesterday, and last cocaine use was about 3 weeks ago.  He was prescribed nicotine  patches to aid with tobacco use cessation during his recent hospitalization.  He requests medication refills today on nicotine  patches.  Patient has a follow up with neurology in November. States that last physical therapy appointment is this upcoming Monday. States that he has had 16 sessions of physical therapy. States that he completed occupational therapy treatments  last week.  He states he  still has patient was given exercises from physical therapy to complete at home.  He states he still has residual left-sided weakness affecting both left upper and lower extremities but currently reports requiring any assistive devices.  Patient denies any falls since he left the hospital.  He also denies other new acute concerns at this time.  Health Maintenance Due  Topic Date Due  . Imm-DTaP/Tdap/Td (1 - Tdap) Never done  . Imm-Pneumococcal 50+ (1 of 2 - PCV) Never done  . Imm-Zoster, Recombinant (1 of 2) Never done  . Imm-COVID-19 (1 - 2024-25 season) Never done  . Imm-Influenza (1) 02/02/2024   Past Medical History:  Diagnosis Date  . Cerebral artery occlusion with cerebral infarction (CMS & HHS-HCC)   . History of stroke    pt had stroke 15 months ago.  . Right sided weakness    Past Surgical History:  Procedure Laterality Date  . CORONARY STENT  2017   At [facility]   Current Outpatient Medications  Medication Sig Dispense Refill  . nicotine  (NICODERM, STEP 2) 14 mg/24 hr patch Place 1 Patch onto the skin once daily (every 24 hours) for 21 days. 21 Patch 0  . atorvastatin  (LIPITOR ) 80 mg tablet Take 80 mg by mouth once daily.    . clopidogreL  (PLAVIX ) 75 mg tablet Take 75 mg by mouth daily    . sennosides-docusate sodium  (PERI-COLACE) 8.6-50 mg per tablet Take 1 Tablet by mouth once daily as needed for constipation (Patient not taking: Reported on 02/24/2024.)    . blood pressure monitor  Use to check blood pressure twice daily and records BP logs nd bring to your office visits 1 Kit 0   No current facility-administered medications for this visit.   No Known Allergies  Family History  Problem Relation Name Age of Onset  . Diabetes Mellitus II Mother         deceased at age 65  . Lung Cancer Father         deceased at age 9  . No Known Problems Paternal Grandfather      Social History   Tobacco Use  . Smoking status: Every Day    Current packs/day: 0.50    Average  packs/day: 0.5 packs/day for 36.7 years (18.4 ttl pk-yrs)    Types: Cigarettes    Start date: 1989    Passive exposure: Current  . Smokeless tobacco: Never  . Tobacco comments:    Smokes about 7 cigarettes daily  Vaping Use  . Vaping status: Never Used  Substance and Sexual Activity  . Alcohol use: Yes    Alcohol/week: 2.0 standard drinks of alcohol    Types: 2 Cans of beer per week    Comment: 24 oz - 2 cans occasionally  . Drug use: Yes    Frequency: 1.0 times per week    Types: Marijuana, Cocaine    Comment: last cocaine use was about 3 weeks ago, marijuana every other day about .5 gram daily  . Sexual activity: Yes    Partners: Female   Social Drivers of Corporate investment banker Strain: Not at Risk (01/08/2023)   Financial Resource Strain   . Financial Resource Strain: 1  Food Insecurity: No Food Insecurity (12/23/2023)   [facility]   Food Insecurity   . Within the past 12 months, you worried that your food would run out before you got the money to buy more.: Never true   . Within the past 12 months, the food you bought just didn't last and you didn't have money to get more.: Never true  Transportation Needs: No Transportation Needs (12/23/2023)   Received from [facility] - Transportation   . In the past 12 months, has lack of transportation kept you from medical appointments or from getting medications?: No   . In the past 12 months, has lack of transportation kept you from meetings, work, or from getting things needed for daily living?: No  Physical Activity: Not on File (12/13/2022)   Physical Activity   . Physical Activity: 0  Stress: Not on File (12/13/2022)   Stress   . Stress: 0  Social Connections: Not on File (02/05/2023)   Social Connections   . Connectedness: 0  Housing Stability: Low Risk  (12/23/2023)   Received from [facility] Stability Vital Sign   . In the last 12 months, was there a time when you were not able to pay  the mortgage or rent on time?: No   . In the past 12 months, how many times have you moved where you were living?: 0   . At any time in the past 12 months, were you homeless or living in a shelter (including now)?: No     Review of Systems  All other systems reviewed and are negative.   Objective BP 115/71  Pulse 62  Temp 98.3 F (36.8 C)  Resp 17  Ht 5' 8 (1.727 m)  Wt 151 lb 6 oz (68.7 kg)  SpO2 98%  BMI  23.02 kg/m  Smoking Status Every Day  BSA 1.82 m  Results for orders placed or performed in visit on 02/24/24  URINE DIP (POCT) AUTOMATIC Urine Routine     Status: Normal   Specimen: Urine  Result Value Ref Range   URINE GLUCOSE NEGATIVE NEGATIVE mg/dL   URINE BILIRUBIN NEGATIVE NEGATIVE   URINE KETONES NEGATIVE NEGATIVE mg/dL   URINE SPECIFIC GRAVITY 1.020 1.015 - >=1.030   URINE BLOOD NEGATIVE NEGATIVE   URINE PH 6.0 5.0 - 8.5   PROTEIN, URINE NEGATIVE NEGATIVE mg/dL   URINE UROBILINOGEN 0.2 0.2 - 1.0 E.U./dL   URINE NITRITE NEGATIVE NEGATIVE   URINE LEUKOCYTES NEGATIVE NEGATIVE   CLARITY OF URINE CLEAR CLEAR   URINE COLOR YELLOW COLORLESS, LIGHT YELLOW, YELLOW, DARK YELLOW, STRAW, AMBER    Physical Exam Vitals and nursing note reviewed. Exam conducted with a chaperone present.   Constitutional:      General: He is not in acute distress.    Appearance: Normal appearance. He is normal weight.  HENT:     Head: Normocephalic.     Nose: Nose normal.     Mouth/Throat:     Mouth: Mucous membranes are moist.     Pharynx: Oropharynx is clear.  Eyes:     Conjunctiva/sclera: Conjunctivae normal.   Cardiovascular:     Rate and Rhythm: Normal rate and regular rhythm.     Pulses: Normal pulses.     Heart sounds: Normal heart sounds.  Pulmonary:     Effort: Pulmonary effort is normal.     Breath sounds: Normal breath sounds.  Abdominal:     General: Bowel sounds are normal.  Skin:    General: Skin is warm and dry.  Neurological:     General: No focal deficit  present.     Mental Status: He is alert and oriented to person, place, and time.     Motor: Weakness (upper arm and leg weakness on left side. Decresed grip strength on left side) present.     Gait: Gait abnormal.  Psychiatric:        Mood and Affect: Mood normal.        Behavior: Behavior normal.     Assessment & Plan  1. Urinary frequency -discussed discontinuing fluid intake at an earlier time before bed - Has no reports of dysuria or hematuria. -His urinalysis was with normal findings. -last PSA was 01/08/2023 level was 0.4 -patient to follow up if symptoms worsen or does not improve - URINE DIP (POCT) AUTOMATIC Urine Routine  2. Left-sided weakness -CVA July 2025 with multiple strokes -Reports compliance with medications including Plavix  75 mg daily, and atorvastatin  80 mg p.o. daily. -Encouraged patient to continue with his medications and follow-up with his neurologist in November as scheduled. -Last physical therapy appointment is this upcoming Monday.  He has completed 16 sessions.  -OT ended last week.  -Patient was given exercises from physical therapy to complete at home. Will start working on those  3. Ischemic cerebrovascular accident (CVA) (CMS & HHS-HCC) -Follow up with neurology in November.  -Last physical therapy appointment is this upcoming Monday.  He has completed 16 sessions.  -OT ended last week.  -Patient was given exercises from physical therapy to complete at home. Will start working on those  4. Vitamin D deficiency -not currently taking a vitamin D supplement  5. Polysubstance abuse (CMS & HHS-HCC) -smokes marijuana every other day last used yesterday -last cocaine use was about 3 weeks ago. -Encourage  patient to abstain from polysubstance abuse to prevent future strokes and other health complications that may include death. - URINE DRUG SCREEN 7 DRUGS + ETOH Urine Routine  6. Cigarette nicotine  dependence with other nicotine -induced  disorder -reports smoking about 7 cigarettes per day - TOBACCO USE CESSATION INTERMEDIATE 3-10 MINUTES - nicotine  (NICODERM, STEP 2) 14 mg/24 hr patch; Place 1 Patch onto the skin once daily (every 24 hours) for 21 days.  Dispense: 21 Patch; Refill: 0    Exam and documentation completed by provider and student NP and reviewed by provider. Agree with Patient's assessment and plan.

## 2024-03-02 ENCOUNTER — Encounter: Payer: Self-pay | Admitting: Physical Therapy

## 2024-03-02 ENCOUNTER — Ambulatory Visit: Payer: Self-pay | Admitting: Physical Therapy

## 2024-03-02 NOTE — Therapy (Signed)
 [facility] [address] [place], [place], [address] Phone: [phone]   Fax:  [phone]  Patient Details  Name: Mark Herrera MRN: [id number] Date of Birth: Aug 16, 1960 Referring Provider:  No ref. provider found  Encounter Date: 03/02/2024  Attempted to call patient regarding missed PT visit this date, left a VM. This was patient's last scheduled appt with planned d/c today, he will be d/c at this time and will need a new referral to return to OPPT services in the future.  Waddell Southgate, PT Waddell Southgate, PT, DPT, CSRS  03/02/2024, 11:41 AM  [facility] [address] [place], [place], [address] Phone: [phone]   Fax:  [phone]

## 2024-03-02 NOTE — Therapy (Incomplete)
 OUTPATIENT PHYSICAL THERAPY NEURO TREATMENT - DISCHARGE NOTE***   Patient Name: Mark Herrera MRN: [id number] DOB:Sep 06, 1960, 63 y.o., male Today's Date: 03/02/2024   PCP: [facility] REFERRING PROVIDER: Jerri Pfeiffer, MD  PHYSICAL THERAPY DISCHARGE SUMMARY  Visits from Start of Care: ***  Current functional level related to goals / functional outcomes: ***   Remaining deficits: ***   Education / Equipment: ***   Patient agrees to discharge. Patient goals were {OP Goals:25702::met}. Patient is being discharged due to {OP Discharge Reasons:25703::meeting the stated rehab goals.}   END OF SESSION:         Past Medical History:  Diagnosis Date   CAD (coronary artery disease)    10/08/16 STEMI DES x1 to pLAD EF 45%   Cocaine abuse (HCC)    CVA (cerebral vascular accident) (HCC)    Gout    Tobacco abuse    Past Surgical History:  Procedure Laterality Date   CORONARY STENT INTERVENTION N/A 10/08/2016   Procedure: Coronary Stent Intervention;  Surgeon: Verlin Lonni BIRCH, MD;  Location: [facility] INVASIVE CV LAB;  Service: Cardiovascular;  Laterality: N/A;   IR CT HEAD LTD  05/31/2020   IR CT HEAD LTD  09/26/2023   IR CT HEAD LTD  12/23/2023   IR PERCUTANEOUS ART THROMBECTOMY/INFUSION INTRACRANIAL INC DIAG ANGIO  05/31/2020   IR PERCUTANEOUS ART THROMBECTOMY/INFUSION INTRACRANIAL INC DIAG ANGIO  09/26/2023   IR PERCUTANEOUS ART THROMBECTOMY/INFUSION INTRACRANIAL INC DIAG ANGIO  12/23/2023   IR US  GUIDE VASC ACCESS RIGHT  05/31/2020   IR US  GUIDE VASC ACCESS RIGHT  09/26/2023   LEFT HEART CATH AND CORONARY ANGIOGRAPHY N/A 10/08/2016   Procedure: Left Heart Cath and Coronary Angiography;  Surgeon: Verlin Lonni BIRCH, MD;  Location: [facility] INVASIVE CV LAB;  Service: Cardiovascular;  Laterality: N/A;   RADIOLOGY WITH ANESTHESIA N/A 05/31/2020   Procedure: IR WITH ANESTHESIA;  Surgeon: Radiologist, Medication, MD;  Location: [facility] OR;  Service: Radiology;   Laterality: N/A;   RADIOLOGY WITH ANESTHESIA N/A 09/05/2021   Procedure: RADIOLOGY WITH ANESTHESIA;  Surgeon: Radiologist, Medication, MD;  Location: [facility] OR;  Service: Radiology;  Laterality: N/A;   RADIOLOGY WITH ANESTHESIA N/A 09/26/2023   Procedure: RADIOLOGY WITH ANESTHESIA;  Surgeon: Radiologist, Medication, MD;  Location: [facility] OR;  Service: Radiology;  Laterality: N/A;   RADIOLOGY WITH ANESTHESIA N/A 12/23/2023   Procedure: RADIOLOGY WITH ANESTHESIA;  Surgeon: Dolphus Carrion, MD;  Location: [facility] OR;  Service: Radiology;  Laterality: N/A;   TOE SURGERY     Patient Active Problem List   Diagnosis Date Noted   Acute ischemic right MCA stroke (HCC) 12/23/2023   Acute ischemic right anterior cerebral artery (ACA) stroke (HCC) 12/23/2023   Acute ischemic stroke (HCC) 09/26/2023   Right middle cerebral artery stroke (HCC)    Bradycardia    Dyslipidemia    Polysubstance abuse (HCC)    CVA (cerebral vascular accident) (HCC) 09/09/2021   Status post stroke 05/31/2020   Stroke (cerebrum) (HCC) 05/31/2020   Essential hypertension 06/01/2019   Cocaine abuse (HCC) 06/01/2019   Stroke-like episode s/p tPA 05/31/2019   Hyperlipemia 10/11/2016   Tobacco abuse 10/11/2016   STEMI (ST elevation myocardial infarction) (HCC) 10/09/2016   Acute ST elevation myocardial infarction (STEMI) involving left anterior descending (LAD) coronary artery (HCC)     ONSET DATE: 12/25/2023 (referral date, CVA 12/23/23)  REFERRING DIAG: I63.9 (ICD-10-CM) - Acute CVA (cerebrovascular accident) (HCC)  THERAPY DIAG:  No diagnosis found.  Rationale for Evaluation and Treatment: Rehabilitation  SUBJECTIVE:                                                                                                                                                                                             SUBJECTIVE STATEMENT: [name]   ***   From Eval: Pt reports that he had a fall on 12/23/23 in his kitchen, he was not able  to get back up by himself. His grandson came in and tried to help him up, he went to the hospital and found out he had a second stroke. He reports ongoing mild weakness on his L side as well as some memory and problem-solving impairments. 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 He has also noticed increased sensitivity in his L arm and L leg since the 2nd stroke. Pt also reports that when he gets up from sitting for a long time or gets up out of bed he is very stiff and stumbles, takes a little bit to loosen up his body.  Pt accompanied by: self  PERTINENT HISTORY: PMH is significant for multiple prior strokes w/ residual R hemiparesis and L sided deficits, CAD, and HLD.  Per Acute Care PT Note 12/24/2023: Pt is a 63 y.o. M presenting to [facility] on 12/23/23 following a fall w/ sudden onset of L sided weakness. Pt admitted w/ R MCA stroke and is s/p R common carotid arteriogram. PMH is significant for multiple prior strokes w/ residual R hemiparesis and L sided deficits, CAD, and HLD.  PAIN:  Are you having pain? No, stiffness  PRECAUTIONS: Fall  RED FLAGS: None   WEIGHT BEARING RESTRICTIONS: No  FALLS: Has patient fallen in last 6 months? Yes. Number of falls 1 when he had the stroke, he was unable to get back up by himself and his grandson had to help him  LIVING ENVIRONMENT: Lives with: lives with their family, he lives with his daughter and son Lives in: House/apartment Stairs: Yes: External: 3 steps; can reach both Has following equipment at home: Environmental consultant - 2 wheeled and Shower bench, grab bars in shower  PLOF: Independent  PATIENT GOALS: to at least have 80% of my mobility back, I'm at a cool 65% right now  OBJECTIVE:  Note: Objective measures were completed at Evaluation unless otherwise noted.  DIAGNOSTIC FINDINGS:   Brain MRI 12/24/2023 IMPRESSION: 1. Small focus of acute ischemia within the anterior right frontal  white matter. 2. Old right MCA territory infarct with encephalomalacia, gliosis, and chronic blood products. 3. Multifocal hyperintense T2-weighted signal within the cerebral white matter, most commonly due to  chronic small vessel disease.  COGNITION: Overall cognitive status: pt reports mild memory impairments, takes longer than normal for problem solving   SENSATION: Decreased on L side as compared to R side Hypersensitivity to vibration on his L side   COORDINATION: WFL in BLE  EDEMA:  None  POSTURE: rounded shoulders, forward head, and posterior pelvic tilt  LOWER EXTREMITY ROM:     Active  Right Eval Left Eval  Hip flexion Tight hip flexors Tight hip flexors  Hip extension    Hip abduction    Hip adduction    Hip internal rotation    Hip external rotation    Knee flexion    Knee extension Tight HS Tight HS  Ankle dorsiflexion Tight heel cords Tight heel cords  Ankle plantarflexion    Ankle inversion    Ankle eversion     (Blank rows = not tested)  LOWER EXTREMITY MMT:    MMT Right Eval Left Eval  Hip flexion 5 4  Hip extension    Hip abduction    Hip adduction    Hip internal rotation    Hip external rotation    Knee flexion 5 4  Knee extension 5 4  Ankle dorsiflexion 5 4  Ankle plantarflexion    Ankle inversion    Ankle eversion    (Blank rows = not tested)  BED MOBILITY:  Mod I per patient report  TRANSFERS: Sit to stand: Modified independence  Assistive device utilized: None     Stand to sit: Modified independence  Assistive device utilized: None     Chair to chair: Modified independence  Assistive device utilized: None       RAMP:  Not tested  CURB:  Not tested  STAIRS: Not tested GAIT: Findings:  Gait pattern: very stiff movements, decreased hip/knee flexion- Right, decreased hip/knee flexion- Left, antalgic, and trunk flexed Distance walked: various clinic distances Assistive device utilized: None Level of assistance:  Modified independence Comments: see above   FUNCTIONAL TESTS:       VITALS  There were no vitals filed for this visit.                                                                                                                                 TREATMENT DATE:  Self-Care/Home Management  Assessed vitals (see above) and WNL***  NMR  To work on functional LE strengthening and proximal LE strengthening ***   Added appropriate exercises to HEP, see bolded below   Gait Gait pattern: knee flexed in stance- Right, knee flexed in stance- Left, trunk flexed, and poor foot clearance- Right Distance walked: Various clinic distances Assistive device utilized: None Level of assistance: Modified independence Comments: flexed trunk, flexed hips and knees, R foot catching occasionally; improved posture overall and LE clearance with decreased trunk and limb flexion as compared to previous observations   Physical Performance ***    PATIENT EDUCATION: Education details: continue  HEP, added to HEP, plan to d/c next visit*** Person educated: Patient Education method: Explanation, Demonstration, Tactile cues, Verbal cues, and Handouts Education comprehension: verbalized understanding, returned demonstration, and needs further education  HOME EXERCISE PROGRAM: Access Code: [email or web address] URL: [email or web address] Date: 01/21/2024 Prepared by: Waddell Southgate  Exercises - Modified Thomas Stretch  - 1 x daily - 7 x weekly - 1 sets - 3-5 reps - 30 sec hold - Long Sitting Hamstring Stretch  - 1 x daily - 7 x weekly - 1 sets - 3-5 reps - 30 sec hold - Long Sitting Calf Stretch with Strap  - 1 x daily - 7 x weekly - 1 sets - 3-5 reps - 30 sec hold - Heel Raises with Counter Support  - 1 x daily - 7 x weekly - 3 sets - 10 reps - Heel Toe Raises with Counter Support  - 1 x daily - 7 x weekly - 3 sets - 10 reps - Alternating Step Taps with Counter Support  - 1 x daily - 7 x  weekly - 3 sets - 10 reps - Narrow Half-Kneeling Balance  - 1 x daily - 7 x weekly - 3 sets - 10 reps - Quadruped Alternating Leg Extensions  - 1 x daily - 7 x weekly - 3 sets - 10 reps   GOALS: Goals reviewed with patient? Yes  SHORT TERM GOALS=LONG TERM GOALS due to length of POC   LONG TERM GOALS: Target date: 02/19/2024   Pt will be independent with final HEP for improved strength, balance, transfers and gait. Baseline: in process of being finalized (9/18) Goal status: IN PROGRESS  2.  Pt will improve gait velocity to at least 3.5 ft/sec for improved gait efficiency and performance at mod I level  Baseline: 3.24 ft/sec mod I (8/12), 3.33 ft/sec mod I (9/18) Goal status: IN PROGRESS  3.  Pt will improve FGA to 23/30 for decreased fall risk  Baseline: 19/30, 19/30 (9/18) Goal status: NOT MET  4.  Pt will trial LRAD to determine if he could benefit from use of an AD for improved balance and decreased fall risk. Baseline: no AD at baseline, AD not needed (9/18) Goal status: MET   NEW SHORT TERM GOALS=LONG TERM GOALS due to length of POC  NEW LONG TERM GOALS:  Target date: 03/18/2024***  Pt will be independent with final HEP for improved strength, balance, transfers and gait. Baseline: in process of being finalized (9/18) Goal status: IN PROGRESS  2.  Pt will improve gait velocity to at least 3.5 ft/sec for improved gait efficiency and performance at mod I level  Baseline: 3.24 ft/sec mod I (8/12), 3.33 ft/sec mod I (9/18) Goal status: IN PROGRESS     ASSESSMENT:  CLINICAL IMPRESSION: Emphasis of skilled PT session on ***    OBJECTIVE IMPAIRMENTS: Abnormal gait, decreased balance, decreased cognition, decreased mobility, difficulty walking, decreased ROM, decreased strength, impaired perceived functional ability, impaired sensation, and postural dysfunction.   ACTIVITY LIMITATIONS: carrying, lifting, bending, sitting, standing, squatting, stairs, transfers, and  bed mobility  PARTICIPATION LIMITATIONS: driving, community activity, and occupation  PERSONAL FACTORS: Fitness and 3+ comorbidities:  multiple prior strokes w/ residual R hemiparesis and L sided deficits, CAD, and HLD.are also affecting patient's functional outcome.   REHAB POTENTIAL: Good  CLINICAL DECISION MAKING: Evolving/moderate complexity  EVALUATION COMPLEXITY: Moderate  PLAN:  PT FREQUENCY: 2x/week  PT DURATION: 4 weeks + 4 weeks (recert, just scheduled 2 visits initially)  PLANNED INTERVENTIONS: 02835- PT  Re-evaluation, 97750- Physical Performance Testing, 97110-Therapeutic exercises, 97530- Therapeutic activity, [id number]- Neuromuscular re-education, [id number]- Self Care, 02859- Manual therapy, [phone]- Gait training, [phone]- Orthotic Initial, [phone]- Orthotic/Prosthetic subsequent, [phone]- Electrical stimulation (manual), [phone] (1-2 muscles), 20561 (3+ muscles)- Dry Needling, Patient/Family education, Balance training, Stair training, Taping, Joint mobilization, Spinal mobilization, Cognitive remediation, DME instructions, Cryotherapy, and Moist heat  PLAN FOR NEXT SESSION: assess vitals, add to and finalize HEP, assess LTG and d/c***  Waddell Southgate, PT Waddell Southgate, PT, DPT, CSRS   03/02/2024, 7:47 AM

## 2024-04-06 ENCOUNTER — Telehealth: Payer: Self-pay | Admitting: Neurology

## 2024-04-06 ENCOUNTER — Telehealth: Payer: Self-pay

## 2024-04-06 NOTE — Telephone Encounter (Signed)
Patient called to verify appointment 

## 2024-04-07 ENCOUNTER — Telehealth: Payer: Self-pay

## 2024-04-07 NOTE — Patient Outreach (Signed)
 Second telephone outreach attempt to obtain mRS. No answer. Left message for returned call.  Shereen [facility]  Population Health Care Management Assistant  Direct Dial: [phone]  Fax: [phone] Website: [email or web address]

## 2024-04-08 ENCOUNTER — Telehealth: Payer: Self-pay

## 2024-04-08 NOTE — Patient Outreach (Signed)
 3 outreach attempts were completed to obtain mRs. mRs could not be obtained because patient never returned my calls. mRs=7    Shereen Gin [facility] Care Management Assistant  Direct Dial: [phone]  Fax: [phone] Website: [email or web address]

## 2024-04-12 ENCOUNTER — Ambulatory Visit: Admitting: Neurology

## 2024-04-12 ENCOUNTER — Encounter: Payer: Self-pay | Admitting: Neurology

## 2024-04-12 ENCOUNTER — Other Ambulatory Visit (HOSPITAL_COMMUNITY): Payer: Self-pay

## 2024-04-12 VITALS — BP 120/80 | HR 57 | Ht 68.0 in | Wt 158.0 lb

## 2024-04-12 DIAGNOSIS — F1910 Other psychoactive substance abuse, uncomplicated: Secondary | ICD-10-CM

## 2024-04-12 DIAGNOSIS — E7849 Other hyperlipidemia: Secondary | ICD-10-CM | POA: Diagnosis not present

## 2024-04-12 DIAGNOSIS — Z8673 Personal history of transient ischemic attack (TIA), and cerebral infarction without residual deficits: Secondary | ICD-10-CM | POA: Diagnosis not present

## 2024-04-12 DIAGNOSIS — R200 Anesthesia of skin: Secondary | ICD-10-CM | POA: Diagnosis not present

## 2024-04-12 MED ORDER — EZETIMIBE 10 MG PO TABS: 10.0000 mg | ORAL_TABLET | Freq: Every day | ORAL | 3 refills | Status: AC

## 2024-04-12 MED ORDER — EZETIMIBE 10 MG PO TABS
10.0000 mg | ORAL_TABLET | Freq: Every day | ORAL | 3 refills | Status: DC
  Filled 2024-04-12: qty 30, 30d supply, fill #0

## 2024-04-12 NOTE — Patient Instructions (Signed)
 I had a long d/w patient about his recent stroke, polysubstance abuse, smoking ,risk for recurrent stroke/TIAs, personally independently reviewed imaging studies and stroke evaluation results and answered questions.Continue Plavix  for secondary stroke prevention and maintain strict control of hypertension with blood pressure goal below 130/90, diabetes with hemoglobin A1c goal below 6.5% and lipids with LDL cholesterol goal below 70 mg/dL. I also advised the patient to eat a healthy diet with plenty of whole grains, cereals, fruits and vegetables, exercise regularly and maintain ideal body weight .I counseled him to quit smoking cigarettes as well as cocaine completely.  I recommended adding Zetia 10 mg daily for optimal cholesterol control.  Followup in the future with my nurse practitioner in 6 months or call earlier if necessary.  Stroke Prevention Some medical conditions and behaviors can lead to a higher chance of having a stroke. You can help prevent a stroke by eating healthy, exercising, not smoking, and managing any medical conditions you have. Stroke is a leading cause of functional impairment. Primary prevention is particularly important because a majority of strokes are first-time events. Stroke changes the lives of not only those who experience a stroke but also their family and other caregivers. How can this condition affect me? A stroke is a medical emergency and should be treated right away. A stroke can lead to brain damage and can sometimes be life-threatening. If a person gets medical treatment right away, there is a better chance of surviving and recovering from a stroke. What can increase my risk? The following medical conditions may increase your risk of a stroke: Cardiovascular disease. High blood pressure (hypertension). Diabetes. High cholesterol. Sickle cell disease. Blood clotting disorders (hypercoagulable state). Obesity. Sleep disorders (obstructive sleep apnea). Other  risk factors include: Being older than age 60. Having a history of blood clots, stroke, or mini-stroke (transient ischemic attack, TIA). Genetic factors, such as race, ethnicity, or a family history of stroke. Smoking cigarettes or using other tobacco products. Taking birth control pills, especially if you also use tobacco. Heavy use of alcohol or drugs, especially cocaine and methamphetamine. Physical inactivity. What actions can I take to prevent this? Manage your health conditions High cholesterol levels. Eating a healthy diet is important for preventing high cholesterol. If cholesterol cannot be managed through diet alone, you may need to take medicines. Take any prescribed medicines to control your cholesterol as told by your health care provider. Hypertension. To reduce your risk of stroke, try to keep your blood pressure below 130/80. Eating a healthy diet and exercising regularly are important for controlling blood pressure. If these steps are not enough to manage your blood pressure, you may need to take medicines. Take any prescribed medicines to control hypertension as told by your health care provider. Ask your health care provider if you should monitor your blood pressure at home. Have your blood pressure checked every year, even if your blood pressure is normal. Blood pressure increases with age and some medical conditions. Diabetes. Eating a healthy diet and exercising regularly are important parts of managing your blood sugar (glucose). If your blood sugar cannot be managed through diet and exercise, you may need to take medicines. Take any prescribed medicines to control your diabetes as told by your health care provider. Get evaluated for obstructive sleep apnea. Talk to your health care provider about getting a sleep evaluation if you snore a lot or have excessive sleepiness. Make sure that any other medical conditions you have, such as atrial fibrillation or  atherosclerosis,  are managed. Nutrition Follow instructions from your health care provider about what to eat or drink to help manage your health condition. These instructions may include: Reducing your daily calorie intake. Limiting how much salt (sodium) you use to 1,500 milligrams (mg) each day. Using only healthy fats for cooking, such as olive oil, canola oil, or sunflower oil. Eating healthy foods. You can do this by: Choosing foods that are high in fiber, such as whole grains, and fresh fruits and vegetables. Eating at least 5 servings of fruits and vegetables a day. Try to fill one-half of your plate with fruits and vegetables at each meal. Choosing lean protein foods, such as lean cuts of meat, poultry without skin, fish, tofu, beans, and nuts. Eating low-fat dairy products. Avoiding foods that are high in sodium. This can help lower blood pressure. Avoiding foods that have saturated fat, trans fat, and cholesterol. This can help prevent high cholesterol. Avoiding processed and prepared foods. Counting your daily carbohydrate intake.  Lifestyle If you drink alcohol: Limit how much you have to: 0-1 drink a day for women who are not pregnant. 0-2 drinks a day for men. Know how much alcohol is in your drink. In the [place] one drink equals one 12 oz bottle of beer ( ), one 5 oz glass of wine ( ), or one 1 oz glass of hard liquor (44mL). Do not use any products that contain nicotine  or tobacco. These products include cigarettes, chewing tobacco, and vaping devices, such as e-cigarettes. If you need help quitting, ask your health care provider. Avoid secondhand smoke. Do not use drugs. Activity  Try to stay at a healthy weight. Get at least 30 minutes of exercise on most days, such as: Fast walking. Biking. Swimming. Medicines Take over-the-counter and prescription medicines only as told by your health care provider. Aspirin  or blood thinners (antiplatelets or  anticoagulants) may be recommended to reduce your risk of forming blood clots that can lead to stroke. Avoid taking birth control pills. Talk to your health care provider about the risks of taking birth control pills if: You are over 52 years old. You smoke. You get very bad headaches. You have had a blood clot. Where to find more information American Stroke Association: www.strokeassociation.org Get help right away if: You or a loved one has any symptoms of a stroke. BE FAST is an easy way to remember the main warning signs of a stroke: B - Balance. Signs are dizziness, sudden trouble walking, or loss of balance. E - Eyes. Signs are trouble seeing or a sudden change in vision. F - Face. Signs are sudden weakness or numbness of the face, or the face or eyelid drooping on one side. A - Arms. Signs are weakness or numbness in an arm. This happens suddenly and usually on one side of the body. S - Speech. Signs are sudden trouble speaking, slurred speech, or trouble understanding what people say. T - Time. Time to call emergency services. Write down what time symptoms started. You or a loved one has other signs of a stroke, such as: A sudden, severe headache with no known cause. Nausea or vomiting. Seizure. These symptoms may represent a serious problem that is an emergency. Do not wait to see if the symptoms will go away. Get medical help right away. Call your local emergency services (911 in the [place] Do not drive yourself to the hospital. Summary You can help to prevent a stroke by eating healthy, exercising, not smoking, limiting alcohol intake, and  managing any medical conditions you may have. Do not use any products that contain nicotine  or tobacco. These include cigarettes, chewing tobacco, and vaping devices, such as e-cigarettes. If you need help quitting, ask your health care provider. Remember BE FAST for warning signs of a stroke. Get help right away if you or a loved one has any  of these signs. This information is not intended to replace advice given to you by your health care provider. Make sure you discuss any questions you have with your health care provider. Document Revised: 04/22/2022 Document Reviewed: 04/22/2022 Elsevier Patient Education  2024 [employer].

## 2024-04-12 NOTE — Progress Notes (Signed)
 [facility] [address] [place]. [place] [address] [phone]       OFFICE FOLLOW-UP NOTE  [name] Date of Birth:  19-Apr-1961 Medical Record Number:  [id number]   HPI: Mr. Dougher is a 63 year old African-American male seen today for initial office follow-up visit following hospital admission for stroke in July 2025.  History is obtained from the patient and review of electronic medical records.  I personally reviewed pertinent available imaging films in PACS.  He has past medical history of cocaine and tobacco abuse, alcoholism, CAD, hyperlipidemia and multiple prior stroke with residual right hemiparesis.  He was admitted on 12/23/2023 with sudden onset of left-sided weakness which he noticed after a fall.  Code stroke CT head showed no acute abnormality but encephalomalacia within right MCA distribution with aspect score of 10.  CT angiogram showed right M2 chronic occlusion with moderate narrowing of the left vertebral artery.  CT perfusion showed 100 cc of penumbra in the right frontal lobe possibly explained by vasospasm.  Patient underwent mechanical thrombectomy with revascularization of the right A2 with tiki 3 revascularization.  Echocardiogram showed EF of 3035%.  No clot.  LDL cholesterol 81 mg percent.  Urine drug screen was negative.  Hemoglobin A1c of 5.4.  Patient was on aspirin  prior to admission and was switched to dual antiplatelet therapy of aspirin  and Plavix  for 3 weeks and then Plavix  alone.  He was discharged home with outpatient therapy needs.  Patient states he has done well since discharge he still has some residual left-sided weakness.  He cannot do his physical job because his stamina is poor and he gets tired easily.  He has stopped aspirin  and is now on Plavix  alone which is tolerating well with minor bruising and no bleeding.  He has cut back smoking but still smokes 5 cigarettes/day.  He is also using nicotine  patches.  He plans to apply  for disability.  He said no recurrent stroke or TIA symptoms.  Last lab work on 03/25/2024 showed LDL cholesterol 80 mg percent and hemoglobin A1c 5.7.  Lab work on 02/24/2024 was also positive for cocaine metabolites. Prior h/o Stroke/ TIA : 05/2019: Admitted for right side weakness.  Received tPA.  MRI negative.  Echo showed EF of 40 to 45%. LDL 99, and A1C 5.4. UDS cocaine and THC positive.  Discharged on DAPT and Lipitor  40.  At this time patient endorsed noncompliance with his medications 05/2020: Admitted for right MCA infarct with right M1 occlusion status post IR with TICI2c. EF 25-30%, LDL 84, A1C 5.4 and UDS positive for cocaine and THC. Discharged on DAPT for 3 weeks and lipitor  80. Recommend 30 day cardiac monitoring as outpt  09/2021: Admitted for dysarthria, left facial droop, left-sided weakness, status post TNK administration.  Echo showed EF of 30 to 35%.  MRI shows acute right MCA branch infarct. CTA  head and  neck showed R M2 occlusion. LDL 105 and A1C 5.3. UDS positive for cocaine and THC. Discharged on DAPT for 3 months, then asa alone as well as lipitor  40. Patient endorses medication non-compliance. 09/2023 admitted for right-sided weakness, speech difficulty and lethargy.  CTA head and neck showed abrupt cut off left M1/M2, chronic proximal right M2 occlusion.  CTP 0/68 cc, status post IR with left MCA TICI2c reperfusion.  MRI showed acute infarct involving left insular cortex as well as additional scattered acute cortical infarct involving left MCA territory.  EF 30 to 35%.  LDL 105, A1c  5.2, UDS positive for cocaine.  Discharged on DAPT for 3 weeks and then aspirin  alone.  Recommend 30-day CardioNet monitor as outpatient ROS:   14 system review of systems is positive for weakness, decreased stamina, bruising and all other systems negative  PMH:  Past Medical History:  Diagnosis Date   CAD (coronary artery disease)    10/08/16 STEMI DES x1 to pLAD EF 45%   Cocaine abuse (HCC)     CVA (cerebral vascular accident) (HCC)    Gout    Tobacco abuse     Social History:  Social History   Socioeconomic History   Marital status: Single    Spouse name: Not on file   Number of children: Not on file   Years of education: Not on file   Highest education level: Not on file  Occupational History   Not on file  Tobacco Use   Smoking status: Every Day    Current packs/day: 1.00    Types: Cigarettes   Smokeless tobacco: Never  Substance and Sexual Activity   Alcohol use: Yes    Comment: occ   Drug use: Yes    Types: Marijuana   Sexual activity: Not on file  Other Topics Concern   Not on file  Social History Narrative   Not on file   Social Drivers of Health   Financial Resource Strain: Not at Risk (01/08/2023)   Received from [employer]    How hard is it for you to pay for the very basics like food, housing, heating, medical care, and medications?: 1  Food Insecurity: No Food Insecurity (12/23/2023)   Hunger Vital Sign    Worried About Running Out of Food in the Last Year: Never true    Ran Out of Food in the Last Year: Never true  Transportation Needs: No Transportation Needs (12/23/2023)   PRAPARE - Administrator, Civil Service (Medical): No    Lack of Transportation (Non-Medical): No  Physical Activity: Not on File (12/13/2022)   Received from [facility]   Physical Activity    Physical Activity: 0  Stress: Not on File (12/13/2022)   Received from [facility]   Stress    Stress: 0  Social Connections: Not on File (02/05/2023)   Received from [facility]   Social Connections    Connectedness: 0  Intimate Partner Violence: Not At Risk (12/23/2023)   Humiliation, Afraid, Rape, and Kick questionnaire    Fear of Current or Ex-Partner: No    Emotionally Abused: No    Physically Abused: No    Sexually Abused: No    Medications:   Current Outpatient Medications on File Prior to Visit  Medication Sig Dispense Refill   atorvastatin  (LIPITOR )  80 MG tablet Take 1 tablet (80 mg total) by mouth daily. 30 tablet 1   clopidogrel  (PLAVIX ) 75 MG tablet Take 1 tablet (75 mg total) by mouth daily. 30 tablet 1   OVER THE COUNTER MEDICATION Take 1 capsule by mouth once a week. [employer]     Vitamin D, Ergocalciferol, (DRISDOL) 1.25 MG (50000 UNIT) CAPS capsule Take 50,000 Units by mouth.     aspirin  81 MG chewable tablet Chew 1 tablet (81 mg total) by mouth daily. (Patient not taking: Reported on 04/12/2024) 30 tablet 1   nicotine  (NICODERM CQ  - DOSED IN MG/24 HOURS) 14 mg/24hr patch Place 1 patch (14 mg total) onto the skin daily. (Patient not taking: Reported on 04/12/2024) 28 patch 0  No current facility-administered medications on file prior to visit.    Allergies:   Allergies  Allergen Reactions   Mushroom Swelling    Physical Exam General: well developed, well nourished, seated, in no evident distress Head: head normocephalic and atraumatic.  Neck: supple with no carotid or supraclavicular bruits Cardiovascular: regular rate and rhythm, no murmurs Musculoskeletal: no deformity Skin:  no rash/petichiae Vascular:  Normal pulses all extremities Vitals:   04/12/24 0903  BP: 120/80  Pulse: (!) 57  SpO2: 99%   Neurologic Exam Mental Status: Awake and fully alert. Oriented to place and time. Recent and remote memory intact. Attention span, concentration and fund of knowledge appropriate. Mood and affect appropriate.  Cranial Nerves: Fundoscopic exam reveals sharp disc margins. Pupils equal, briskly reactive to light. Extraocular movements full without nystagmus. Visual fields full to confrontation. Hearing intact. Facial sensation intact. Face, tongue, palate moves normally and symmetrically.  Motor: Normal bulk and tone. Normal strength in all tested extremity muscles. Sensory.:  Diminished left hemibody sensation to touch ,pinprick .position and vibratory sensation.  Coordination: Rapid alternating movements normal in all  extremities. Finger-to-nose and heel-to-shin performed accurately bilaterally. Gait and Station: Arises from chair without difficulty. Stance is normal. Gait demonstrates normal stride length and balance . Able to heel, toe and tandem walk without difficulty.  Reflexes: 1+ and symmetric. Toes downgoing.   NIHSS  1 Modified Rankin  2   ASSESSMENT: 63 year old African-American male with right  frontal infarct secondary to right A2 occlusion status post mechanical thrombectomy with excellent revascularization In July 2025.  Etiology likely large vessel occlusion in the setting of prior substance abuse.  Vascular risk factors of prior strokes, hyperlipidemia, cigarette smoking and cocaine abuse     PLAN:I had a long d/w patient about his recent stroke, polysubstance abuse, smoking ,risk for recurrent stroke/TIAs, personally independently reviewed imaging studies and stroke evaluation results and answered questions.Continue Plavix  for secondary stroke prevention and maintain strict control of hypertension with blood pressure goal below 130/90, diabetes with hemoglobin A1c goal below 6.5% and lipids with LDL cholesterol goal below 70 mg/dL. I also advised the patient to eat a healthy diet with plenty of whole grains, cereals, fruits and vegetables, exercise regularly and maintain ideal body weight .I counseled him to quit smoking cigarettes as well as cocaine completely.  I recommended adding Zetia 10 mg daily for optimal cholesterol control.  Followup in the future with my nurse practitioner in 6 months or call earlier if necessary.    I personally spent a total of 50 minutes in the care of the patient today including getting/reviewing separately obtained history, performing a medically appropriate exam/evaluation, counseling and educating, placing orders, referring and communicating with other health care professionals, documenting clinical information in the EHR, independently interpreting results, and  coordinating care.     Eather Popp, MD   Note: This document was prepared with digital dictation and possible smart phrase technology. Any transcriptional errors that result from this process are unintentional

## 2024-04-27 NOTE — Progress Notes (Deleted)
 Cardiology Office Note:    Date:  04/27/2024   ID:  Mark Herrera, DOB 1961/01/12, MRN [id number]  PCP:  Med[facility], [facility]   [facility] Providers Cardiologist:  None { Click to update primary MD,subspecialty MD or APP then REFRESH:1}    Referring MD: Gayland Lauraine JINNY, NP   No chief complaint on file. ***  History of Present Illness:    Mark Herrera is a 63 y.o. male is seen at the request of Lauraine Gayland NP for evaluation of CAD. He has a history of CAD s/p anterior STEMI in 2018 treated with PCI of the proximal LAD with DES. History of HTN, cocaine and tobacco abuse. Has been lost to cardiac follow up since 2018. He was admitted in Dec 2020 with left CVA and received lytic therapy. In Dec 2021 admitted with R MCA stroke and underwent embolectomy. Admitted for recurrent right MCA stroke in April 2023 managed conservatively. In April 2025 admitted with left temporal and insular cortex CVA in setting of ongoing cocaine use. Echos have shown persistently low EF without LV fhrombus. Last Echo in July with EF 30-35%. Event monitor showed no Afib.   Past Medical History:  Diagnosis Date   CAD (coronary artery disease)    10/08/16 STEMI DES x1 to pLAD EF 45%   Cocaine abuse (HCC)    CVA (cerebral vascular accident) (HCC)    Gout    Tobacco abuse     Past Surgical History:  Procedure Laterality Date   CORONARY STENT INTERVENTION N/A 10/08/2016   Procedure: Coronary Stent Intervention;  Surgeon: Verlin Lonni BIRCH, MD;  Location: [facility] INVASIVE CV LAB;  Service: Cardiovascular;  Laterality: N/A;   IR CT HEAD LTD  05/31/2020   IR CT HEAD LTD  09/26/2023   IR CT HEAD LTD  12/23/2023   IR PERCUTANEOUS ART THROMBECTOMY/INFUSION INTRACRANIAL INC DIAG ANGIO  05/31/2020   IR PERCUTANEOUS ART THROMBECTOMY/INFUSION INTRACRANIAL INC DIAG ANGIO  09/26/2023   IR PERCUTANEOUS ART THROMBECTOMY/INFUSION INTRACRANIAL INC DIAG ANGIO  12/23/2023   IR US  GUIDE VASC ACCESS  RIGHT  05/31/2020   IR US  GUIDE VASC ACCESS RIGHT  09/26/2023   LEFT HEART CATH AND CORONARY ANGIOGRAPHY N/A 10/08/2016   Procedure: Left Heart Cath and Coronary Angiography;  Surgeon: Verlin Lonni BIRCH, MD;  Location: [facility] INVASIVE CV LAB;  Service: Cardiovascular;  Laterality: N/A;   RADIOLOGY WITH ANESTHESIA N/A 05/31/2020   Procedure: IR WITH ANESTHESIA;  Surgeon: Radiologist, Medication, MD;  Location: [facility] OR;  Service: Radiology;  Laterality: N/A;   RADIOLOGY WITH ANESTHESIA N/A 09/05/2021   Procedure: RADIOLOGY WITH ANESTHESIA;  Surgeon: Radiologist, Medication, MD;  Location: [facility] OR;  Service: Radiology;  Laterality: N/A;   RADIOLOGY WITH ANESTHESIA N/A 09/26/2023   Procedure: RADIOLOGY WITH ANESTHESIA;  Surgeon: Radiologist, Medication, MD;  Location: [facility] OR;  Service: Radiology;  Laterality: N/A;   RADIOLOGY WITH ANESTHESIA N/A 12/23/2023   Procedure: RADIOLOGY WITH ANESTHESIA;  Surgeon: Dolphus Carrion, MD;  Location: [facility] OR;  Service: Radiology;  Laterality: N/A;   TOE SURGERY      Current Medications: No outpatient medications have been marked as taking for the 05/03/24 encounter (Appointment) with Shalandria Elsbernd M, MD.     Allergies:   Mushroom   Social History   Socioeconomic History   Marital status: Single    Spouse name: Not on file   Number of children: Not on file   Years of education: Not on file   Highest education  level: Not on file  Occupational History   Not on file  Tobacco Use   Smoking status: Every Day    Current packs/day: 1.00    Types: Cigarettes   Smokeless tobacco: Never  Substance and Sexual Activity   Alcohol use: Yes    Comment: occ   Drug use: Yes    Types: Marijuana   Sexual activity: Not on file  Other Topics Concern   Not on file  Social History Narrative   Not on file   Social Drivers of Health   Financial Resource Strain: Not at Risk (01/08/2023)   Received from [employer]    How hard is it for you to pay  for the very basics like food, housing, heating, medical care, and medications?: 1  Food Insecurity: No Food Insecurity (12/23/2023)   Hunger Vital Sign    Worried About Running Out of Food in the Last Year: Never true    Ran Out of Food in the Last Year: Never true  Transportation Needs: No Transportation Needs (12/23/2023)   PRAPARE - Administrator, Civil Service (Medical): No    Lack of Transportation (Non-Medical): No  Physical Activity: Not on File (12/13/2022)   Received from [facility]   Physical Activity    Physical Activity: 0  Stress: Not on File (12/13/2022)   Received from [facility]   Stress    Stress: 0  Social Connections: Not on File (02/05/2023)   Received from [facility]   Social Connections    Connectedness: 0     Family History: The patient's ***family history includes Hypertension in his father.  ROS:   Please see the history of present illness.    *** All other systems reviewed and are negative.  EKGs/Labs/Other Studies Reviewed:    The following studies were reviewed today: ***      Recent Labs: 12/23/2023: ALT 14 12/24/2023: BUN 10; Creatinine, Ser 0.93; Hemoglobin 12.1; Platelets 184; Potassium 4.1; Sodium 137  Recent Lipid Panel    Component Value Date/Time   CHOL 130 12/24/2023 0613   TRIG 25 12/24/2023 0613   HDL 44 12/24/2023 0613   CHOLHDL 3.0 12/24/2023 0613   VLDL 5 12/24/2023 0613   LDLCALC 81 12/24/2023 0613     Risk Assessment/Calculations:   {Does this patient have ATRIAL FIBRILLATION?[phone]}  No BP recorded.  {Refresh Note OR Click here to enter BP  :1}***         Physical Exam:    VS:  There were no vitals taken for this visit.    Wt Readings from Last 3 Encounters:  04/12/24 158 lb (71.7 kg)  12/24/23 158 lb 8.2 oz (71.9 kg)  10/28/23 158 lb 8 oz (71.9 kg)     GEN: *** Well nourished, well developed in no acute distress HEENT: Normal NECK: No JVD; No carotid bruits LYMPHATICS: No lymphadenopathy CARDIAC: ***RRR,  no murmurs, rubs, gallops RESPIRATORY:  Clear to auscultation without rales, wheezing or rhonchi  ABDOMEN: Soft, non-tender, non-distended MUSCULOSKELETAL:  No edema; No deformity  SKIN: Warm and dry NEUROLOGIC:  Alert and oriented x 3 PSYCHIATRIC:  Normal affect   ASSESSMENT:    No diagnosis found. PLAN:    In order of problems listed above:  ***      {Are you ordering a CV Procedure (e.g. stress test, cath, DCCV, TEE, etc)?   Press F2        :[id number]}    Medication Adjustments/Labs and Tests Ordered: Current  medicines are reviewed at length with the patient today.  Concerns regarding medicines are outlined above.  No orders of the defined types were placed in this encounter.  No orders of the defined types were placed in this encounter.   There are no Patient Instructions on file for this visit.   Signed, Dillin Lofgren, MD  04/27/2024 1:40 PM    [facility]

## 2024-05-03 ENCOUNTER — Ambulatory Visit: Attending: Cardiology | Admitting: Cardiology

## 2024-05-04 ENCOUNTER — Encounter: Payer: Self-pay | Admitting: Cardiology

## 2024-05-06 ENCOUNTER — Ambulatory Visit: Admitting: Neurology

## 2024-05-24 NOTE — Patient Outreach (Signed)
 First telephone outreach attempt to obtain mRS. No answer. Left message for returned call.  [facility]  Population Health Care Management Assistant  Direct Dial: [phone]  Fax: [phone] Website: [email or web address]

## 2024-11-09 ENCOUNTER — Ambulatory Visit: Admitting: Neurology
